# Patient Record
Sex: Male | Born: 1935 | Race: White | Hispanic: No | Marital: Married | Smoking: Current every day smoker
Health system: Southern US, Community
[De-identification: ages and names within clinical notes are randomized; demographics above are authoritative.]

## PROBLEM LIST (undated history)

## (undated) DIAGNOSIS — G8929 Other chronic pain: Secondary | ICD-10-CM

## (undated) DIAGNOSIS — I509 Heart failure, unspecified: Secondary | ICD-10-CM

## (undated) DIAGNOSIS — M1990 Unspecified osteoarthritis, unspecified site: Secondary | ICD-10-CM

## (undated) DIAGNOSIS — M436 Torticollis: Secondary | ICD-10-CM

## (undated) DIAGNOSIS — N189 Chronic kidney disease, unspecified: Secondary | ICD-10-CM

## (undated) DIAGNOSIS — C801 Malignant (primary) neoplasm, unspecified: Secondary | ICD-10-CM

## (undated) DIAGNOSIS — J449 Chronic obstructive pulmonary disease, unspecified: Secondary | ICD-10-CM

## (undated) DIAGNOSIS — I4901 Ventricular fibrillation: Secondary | ICD-10-CM

## (undated) DIAGNOSIS — R0600 Dyspnea, unspecified: Secondary | ICD-10-CM

## (undated) DIAGNOSIS — Z789 Other specified health status: Secondary | ICD-10-CM

## (undated) DIAGNOSIS — E669 Obesity, unspecified: Secondary | ICD-10-CM

## (undated) DIAGNOSIS — J189 Pneumonia, unspecified organism: Secondary | ICD-10-CM

## (undated) DIAGNOSIS — G9511 Acute infarction of spinal cord (embolic) (nonembolic): Secondary | ICD-10-CM

## (undated) DIAGNOSIS — Z950 Presence of cardiac pacemaker: Secondary | ICD-10-CM

## (undated) DIAGNOSIS — K219 Gastro-esophageal reflux disease without esophagitis: Secondary | ICD-10-CM

## (undated) DIAGNOSIS — I2510 Atherosclerotic heart disease of native coronary artery without angina pectoris: Secondary | ICD-10-CM

## (undated) DIAGNOSIS — E785 Hyperlipidemia, unspecified: Secondary | ICD-10-CM

## (undated) DIAGNOSIS — I447 Left bundle-branch block, unspecified: Secondary | ICD-10-CM

## (undated) DIAGNOSIS — N400 Enlarged prostate without lower urinary tract symptoms: Secondary | ICD-10-CM

## (undated) DIAGNOSIS — D649 Anemia, unspecified: Secondary | ICD-10-CM

## (undated) DIAGNOSIS — I4891 Unspecified atrial fibrillation: Secondary | ICD-10-CM

## (undated) DIAGNOSIS — Z95810 Presence of automatic (implantable) cardiac defibrillator: Secondary | ICD-10-CM

## (undated) DIAGNOSIS — F0390 Unspecified dementia without behavioral disturbance: Secondary | ICD-10-CM

## (undated) DIAGNOSIS — M5412 Radiculopathy, cervical region: Secondary | ICD-10-CM

## (undated) DIAGNOSIS — I10 Essential (primary) hypertension: Secondary | ICD-10-CM

## (undated) DIAGNOSIS — M542 Cervicalgia: Secondary | ICD-10-CM

## (undated) HISTORY — DX: Unspecified atrial fibrillation: I48.91

## (undated) HISTORY — PX: EYE SURGERY: SHX253

## (undated) HISTORY — DX: Anemia, unspecified: D64.9

## (undated) HISTORY — DX: Atherosclerotic heart disease of native coronary artery without angina pectoris: I25.10

## (undated) HISTORY — DX: Pneumonia, unspecified organism: J18.9

## (undated) HISTORY — DX: Dyspnea, unspecified: R06.00

## (undated) HISTORY — PX: CORONARY ANGIOPLASTY: SHX604

## (undated) HISTORY — PX: ROTATOR CUFF REPAIR: SHX139

## (undated) HISTORY — DX: Benign prostatic hyperplasia without lower urinary tract symptoms: N40.0

## (undated) HISTORY — DX: Heart failure, unspecified: I50.9

## (undated) HISTORY — DX: Left bundle-branch block, unspecified: I44.7

## (undated) HISTORY — DX: Chronic obstructive pulmonary disease, unspecified: J44.9

## (undated) HISTORY — DX: Obesity, unspecified: E66.9

## (undated) HISTORY — DX: Essential (primary) hypertension: I10

## (undated) HISTORY — PX: OTHER SURGICAL HISTORY: SHX169

## (undated) HISTORY — DX: Hyperlipidemia, unspecified: E78.5

## (undated) HISTORY — DX: Ventricular fibrillation: I49.01

---

## 1986-04-13 HISTORY — PX: KNEE ARTHROSCOPY: SHX127

## 1994-04-13 HISTORY — PX: KNEE SURGERY: SHX244

## 1997-07-12 ENCOUNTER — Encounter: Payer: Self-pay | Admitting: Family Medicine

## 1997-07-12 LAB — CONVERTED CEMR LAB: PSA: 2.6 ng/mL

## 1997-09-11 DIAGNOSIS — R918 Other nonspecific abnormal finding of lung field: Secondary | ICD-10-CM | POA: Insufficient documentation

## 1997-09-11 DIAGNOSIS — N400 Enlarged prostate without lower urinary tract symptoms: Secondary | ICD-10-CM

## 1997-09-11 HISTORY — DX: Benign prostatic hyperplasia without lower urinary tract symptoms: N40.0

## 1997-09-11 HISTORY — PX: ESOPHAGOGASTRODUODENOSCOPY: SHX1529

## 1997-09-12 ENCOUNTER — Other Ambulatory Visit: Admission: RE | Admit: 1997-09-12 | Discharge: 1997-09-12 | Payer: Self-pay | Admitting: Oncology

## 1997-10-24 ENCOUNTER — Encounter: Admission: RE | Admit: 1997-10-24 | Discharge: 1998-01-22 | Payer: Self-pay | Admitting: Oncology

## 1997-11-11 HISTORY — PX: COLONOSCOPY W/ POLYPECTOMY: SHX1380

## 1997-11-26 ENCOUNTER — Other Ambulatory Visit: Admission: RE | Admit: 1997-11-26 | Discharge: 1997-11-26 | Payer: Self-pay | Admitting: Internal Medicine

## 1998-01-21 ENCOUNTER — Inpatient Hospital Stay: Admission: RE | Admit: 1998-01-21 | Discharge: 1998-01-25 | Payer: Self-pay | Admitting: Thoracic Surgery

## 1998-01-21 HISTORY — PX: LOBECTOMY: SHX5089

## 1998-01-22 ENCOUNTER — Encounter: Payer: Self-pay | Admitting: Thoracic Surgery

## 1998-01-23 ENCOUNTER — Encounter: Payer: Self-pay | Admitting: Thoracic Surgery

## 1998-01-24 ENCOUNTER — Encounter: Payer: Self-pay | Admitting: Thoracic Surgery

## 2000-04-13 ENCOUNTER — Encounter: Payer: Self-pay | Admitting: Family Medicine

## 2000-04-13 LAB — CONVERTED CEMR LAB: PSA: 3.8 ng/mL

## 2001-11-11 ENCOUNTER — Encounter: Payer: Self-pay | Admitting: Family Medicine

## 2002-05-20 ENCOUNTER — Inpatient Hospital Stay (HOSPITAL_COMMUNITY): Admission: EM | Admit: 2002-05-20 | Discharge: 2002-05-22 | Payer: Self-pay | Admitting: Emergency Medicine

## 2002-05-22 ENCOUNTER — Encounter (INDEPENDENT_AMBULATORY_CARE_PROVIDER_SITE_OTHER): Payer: Self-pay | Admitting: Specialist

## 2002-05-22 HISTORY — PX: ESOPHAGOGASTRODUODENOSCOPY: SHX1529

## 2002-06-26 HISTORY — PX: COLONOSCOPY: SHX174

## 2002-07-13 ENCOUNTER — Encounter: Payer: Self-pay | Admitting: Family Medicine

## 2003-05-15 ENCOUNTER — Encounter: Payer: Self-pay | Admitting: Family Medicine

## 2003-05-15 LAB — FECAL OCCULT BLOOD, GUAIAC: Fecal Occult Blood: NEGATIVE

## 2004-03-24 ENCOUNTER — Ambulatory Visit: Payer: Self-pay | Admitting: Family Medicine

## 2004-05-07 ENCOUNTER — Ambulatory Visit: Payer: Self-pay | Admitting: Family Medicine

## 2004-05-27 ENCOUNTER — Ambulatory Visit: Payer: Self-pay | Admitting: Family Medicine

## 2004-05-28 ENCOUNTER — Ambulatory Visit: Payer: Self-pay | Admitting: Family Medicine

## 2004-06-12 ENCOUNTER — Ambulatory Visit: Payer: Self-pay | Admitting: Internal Medicine

## 2004-07-10 ENCOUNTER — Ambulatory Visit: Payer: Self-pay | Admitting: Internal Medicine

## 2004-07-10 HISTORY — PX: COLONOSCOPY: SHX174

## 2004-07-24 ENCOUNTER — Ambulatory Visit: Payer: Self-pay | Admitting: Family Medicine

## 2004-08-26 ENCOUNTER — Ambulatory Visit: Payer: Self-pay | Admitting: Family Medicine

## 2004-09-25 ENCOUNTER — Ambulatory Visit: Payer: Self-pay | Admitting: Family Medicine

## 2004-10-30 ENCOUNTER — Ambulatory Visit: Payer: Self-pay | Admitting: Family Medicine

## 2004-10-30 ENCOUNTER — Ambulatory Visit: Payer: Self-pay | Admitting: Internal Medicine

## 2004-12-04 ENCOUNTER — Ambulatory Visit: Payer: Self-pay | Admitting: Family Medicine

## 2005-01-16 ENCOUNTER — Ambulatory Visit: Payer: Self-pay | Admitting: Family Medicine

## 2005-02-19 ENCOUNTER — Ambulatory Visit: Payer: Self-pay | Admitting: Family Medicine

## 2005-03-27 ENCOUNTER — Ambulatory Visit: Payer: Self-pay | Admitting: Family Medicine

## 2005-04-13 HISTORY — PX: INSERT / REPLACE / REMOVE PACEMAKER: SUR710

## 2005-05-07 ENCOUNTER — Ambulatory Visit: Payer: Self-pay | Admitting: Family Medicine

## 2005-06-15 ENCOUNTER — Ambulatory Visit: Payer: Self-pay | Admitting: Family Medicine

## 2005-07-21 ENCOUNTER — Ambulatory Visit: Payer: Self-pay | Admitting: Family Medicine

## 2005-09-11 ENCOUNTER — Encounter: Payer: Self-pay | Admitting: Family Medicine

## 2005-09-16 ENCOUNTER — Ambulatory Visit: Payer: Self-pay | Admitting: Family Medicine

## 2005-09-18 ENCOUNTER — Ambulatory Visit: Payer: Self-pay | Admitting: Family Medicine

## 2005-10-19 ENCOUNTER — Ambulatory Visit: Payer: Self-pay | Admitting: Family Medicine

## 2005-11-09 ENCOUNTER — Ambulatory Visit: Payer: Self-pay | Admitting: Family Medicine

## 2005-11-11 ENCOUNTER — Encounter: Payer: Self-pay | Admitting: Family Medicine

## 2005-12-10 ENCOUNTER — Emergency Department (HOSPITAL_COMMUNITY): Admission: EM | Admit: 2005-12-10 | Discharge: 2005-12-10 | Payer: Self-pay | Admitting: Emergency Medicine

## 2005-12-11 ENCOUNTER — Ambulatory Visit: Payer: Self-pay | Admitting: Family Medicine

## 2005-12-14 ENCOUNTER — Ambulatory Visit: Payer: Self-pay | Admitting: Family Medicine

## 2005-12-15 ENCOUNTER — Ambulatory Visit: Payer: Self-pay | Admitting: Family Medicine

## 2005-12-16 ENCOUNTER — Ambulatory Visit: Payer: Self-pay | Admitting: Family Medicine

## 2005-12-18 ENCOUNTER — Ambulatory Visit: Payer: Self-pay | Admitting: Family Medicine

## 2005-12-21 ENCOUNTER — Ambulatory Visit: Payer: Self-pay | Admitting: Family Medicine

## 2005-12-23 ENCOUNTER — Ambulatory Visit: Payer: Self-pay | Admitting: Family Medicine

## 2005-12-25 ENCOUNTER — Ambulatory Visit: Payer: Self-pay | Admitting: Family Medicine

## 2005-12-28 ENCOUNTER — Ambulatory Visit: Payer: Self-pay | Admitting: Family Medicine

## 2005-12-30 ENCOUNTER — Ambulatory Visit: Payer: Self-pay | Admitting: Family Medicine

## 2006-01-01 ENCOUNTER — Ambulatory Visit: Payer: Self-pay | Admitting: Family Medicine

## 2006-01-11 ENCOUNTER — Ambulatory Visit: Payer: Self-pay | Admitting: Internal Medicine

## 2006-01-11 ENCOUNTER — Ambulatory Visit: Payer: Self-pay | Admitting: *Deleted

## 2006-01-11 HISTORY — PX: CARDIAC CATHETERIZATION: SHX172

## 2006-01-15 ENCOUNTER — Ambulatory Visit: Payer: Self-pay | Admitting: Family Medicine

## 2006-01-21 ENCOUNTER — Ambulatory Visit: Payer: Self-pay | Admitting: Internal Medicine

## 2006-01-21 ENCOUNTER — Inpatient Hospital Stay (HOSPITAL_COMMUNITY): Admission: EM | Admit: 2006-01-21 | Discharge: 2006-01-28 | Payer: Self-pay | Admitting: Emergency Medicine

## 2006-01-22 ENCOUNTER — Encounter: Payer: Self-pay | Admitting: Cardiology

## 2006-01-26 ENCOUNTER — Ambulatory Visit: Payer: Self-pay | Admitting: Internal Medicine

## 2006-02-04 ENCOUNTER — Ambulatory Visit: Payer: Self-pay | Admitting: Family Medicine

## 2006-02-08 ENCOUNTER — Ambulatory Visit: Payer: Self-pay | Admitting: Family Medicine

## 2006-02-15 ENCOUNTER — Ambulatory Visit: Payer: Self-pay | Admitting: Family Medicine

## 2006-02-16 ENCOUNTER — Ambulatory Visit: Payer: Self-pay | Admitting: Internal Medicine

## 2006-02-18 ENCOUNTER — Inpatient Hospital Stay (HOSPITAL_COMMUNITY): Admission: EM | Admit: 2006-02-18 | Discharge: 2006-02-22 | Payer: Self-pay | Admitting: Emergency Medicine

## 2006-02-19 ENCOUNTER — Encounter (INDEPENDENT_AMBULATORY_CARE_PROVIDER_SITE_OTHER): Payer: Self-pay | Admitting: Specialist

## 2006-02-19 HISTORY — PX: ESOPHAGOGASTRODUODENOSCOPY: SHX1529

## 2006-02-26 ENCOUNTER — Ambulatory Visit (HOSPITAL_COMMUNITY): Admission: RE | Admit: 2006-02-26 | Discharge: 2006-02-26 | Payer: Self-pay | Admitting: Internal Medicine

## 2006-03-02 ENCOUNTER — Encounter: Payer: Self-pay | Admitting: Internal Medicine

## 2006-03-02 ENCOUNTER — Ambulatory Visit: Payer: Self-pay

## 2006-03-08 ENCOUNTER — Ambulatory Visit: Payer: Self-pay | Admitting: Internal Medicine

## 2006-03-08 ENCOUNTER — Ambulatory Visit: Payer: Self-pay | Admitting: Family Medicine

## 2006-03-10 ENCOUNTER — Ambulatory Visit: Payer: Self-pay | Admitting: Internal Medicine

## 2006-03-11 ENCOUNTER — Ambulatory Visit: Payer: Self-pay | Admitting: Internal Medicine

## 2006-03-22 ENCOUNTER — Ambulatory Visit: Payer: Self-pay | Admitting: Family Medicine

## 2006-03-24 ENCOUNTER — Ambulatory Visit: Payer: Self-pay | Admitting: Family Medicine

## 2006-03-26 ENCOUNTER — Ambulatory Visit: Payer: Self-pay | Admitting: Family Medicine

## 2006-04-16 ENCOUNTER — Ambulatory Visit: Payer: Self-pay | Admitting: Family Medicine

## 2006-04-23 ENCOUNTER — Ambulatory Visit: Payer: Self-pay | Admitting: Family Medicine

## 2006-04-27 ENCOUNTER — Ambulatory Visit: Payer: Self-pay | Admitting: Internal Medicine

## 2006-05-06 ENCOUNTER — Ambulatory Visit: Payer: Self-pay | Admitting: Family Medicine

## 2006-06-21 ENCOUNTER — Encounter: Payer: Self-pay | Admitting: Internal Medicine

## 2006-06-21 ENCOUNTER — Ambulatory Visit: Payer: Self-pay

## 2006-06-29 ENCOUNTER — Ambulatory Visit: Payer: Self-pay | Admitting: Internal Medicine

## 2006-06-30 ENCOUNTER — Encounter: Payer: Self-pay | Admitting: Family Medicine

## 2006-06-30 DIAGNOSIS — Z8709 Personal history of other diseases of the respiratory system: Secondary | ICD-10-CM | POA: Insufficient documentation

## 2006-06-30 DIAGNOSIS — N400 Enlarged prostate without lower urinary tract symptoms: Secondary | ICD-10-CM | POA: Insufficient documentation

## 2006-06-30 DIAGNOSIS — J449 Chronic obstructive pulmonary disease, unspecified: Secondary | ICD-10-CM

## 2006-06-30 DIAGNOSIS — D509 Iron deficiency anemia, unspecified: Secondary | ICD-10-CM

## 2006-06-30 DIAGNOSIS — E785 Hyperlipidemia, unspecified: Secondary | ICD-10-CM | POA: Insufficient documentation

## 2006-06-30 DIAGNOSIS — K645 Perianal venous thrombosis: Secondary | ICD-10-CM | POA: Insufficient documentation

## 2006-06-30 DIAGNOSIS — Z872 Personal history of diseases of the skin and subcutaneous tissue: Secondary | ICD-10-CM | POA: Insufficient documentation

## 2006-07-06 ENCOUNTER — Ambulatory Visit: Payer: Self-pay | Admitting: Internal Medicine

## 2006-07-12 ENCOUNTER — Ambulatory Visit: Payer: Self-pay | Admitting: Internal Medicine

## 2006-07-12 ENCOUNTER — Ambulatory Visit (HOSPITAL_COMMUNITY): Admission: RE | Admit: 2006-07-12 | Discharge: 2006-07-12 | Payer: Self-pay | Admitting: Internal Medicine

## 2006-07-27 ENCOUNTER — Ambulatory Visit: Payer: Self-pay | Admitting: Family Medicine

## 2006-07-30 ENCOUNTER — Ambulatory Visit: Payer: Self-pay | Admitting: Internal Medicine

## 2006-08-04 ENCOUNTER — Ambulatory Visit: Payer: Self-pay | Admitting: Internal Medicine

## 2006-08-17 ENCOUNTER — Ambulatory Visit: Payer: Self-pay | Admitting: Family Medicine

## 2006-08-17 LAB — CONVERTED CEMR LAB
CO2: 29 meq/L (ref 19–32)
Chloride: 104 meq/L (ref 96–112)
Creatinine, Ser: 1.2 mg/dL (ref 0.4–1.5)
HCT: 30.9 % — ABNORMAL LOW (ref 39.0–52.0)
Hemoglobin: 10.4 g/dL — ABNORMAL LOW (ref 13.0–17.0)
MCV: 83.8 fL (ref 78.0–100.0)
Prothrombin Time: 11.8 s (ref 10.0–14.0)
RDW: 15.5 % — ABNORMAL HIGH (ref 11.5–14.6)
Sodium: 140 meq/L (ref 135–145)
WBC: 7.7 10*3/uL (ref 4.5–10.5)
aPTT: 33.8 s (ref 26.5–36.5)

## 2006-08-18 ENCOUNTER — Ambulatory Visit: Payer: Self-pay | Admitting: Family Medicine

## 2006-08-19 ENCOUNTER — Telehealth (INDEPENDENT_AMBULATORY_CARE_PROVIDER_SITE_OTHER): Payer: Self-pay | Admitting: *Deleted

## 2006-08-19 LAB — CONVERTED CEMR LAB
Iron: 49 ug/dL (ref 42–165)
Transferrin: 277.7 mg/dL (ref >=212.0)

## 2006-08-24 ENCOUNTER — Inpatient Hospital Stay (HOSPITAL_COMMUNITY): Admission: RE | Admit: 2006-08-24 | Discharge: 2006-08-26 | Payer: Self-pay | Admitting: Internal Medicine

## 2006-08-24 ENCOUNTER — Ambulatory Visit: Payer: Self-pay | Admitting: Internal Medicine

## 2006-08-27 ENCOUNTER — Ambulatory Visit: Payer: Self-pay | Admitting: Family Medicine

## 2006-08-31 ENCOUNTER — Encounter: Payer: Self-pay | Admitting: Family Medicine

## 2006-09-07 ENCOUNTER — Ambulatory Visit: Payer: Self-pay | Admitting: Internal Medicine

## 2006-09-08 ENCOUNTER — Ambulatory Visit: Payer: Self-pay

## 2006-09-15 ENCOUNTER — Ambulatory Visit: Payer: Self-pay | Admitting: Internal Medicine

## 2006-09-17 ENCOUNTER — Ambulatory Visit: Payer: Self-pay | Admitting: Family Medicine

## 2006-09-18 LAB — CONVERTED CEMR LAB
Chloride: 104 meq/L (ref 96–112)
GFR calc non Af Amer: 49 mL/min
Potassium: 4 meq/L (ref 3.5–5.1)
Sodium: 140 meq/L (ref 135–145)

## 2006-09-21 ENCOUNTER — Ambulatory Visit: Payer: Self-pay | Admitting: Family Medicine

## 2006-09-28 ENCOUNTER — Ambulatory Visit: Payer: Self-pay | Admitting: Family Medicine

## 2006-09-28 LAB — CONVERTED CEMR LAB
BUN: 21 mg/dL (ref 6–23)
Creatinine, Ser: 1.4 mg/dL (ref 0.4–1.5)
GFR calc Af Amer: 64 mL/min
Potassium: 4.1 meq/L (ref 3.5–5.1)

## 2006-09-30 ENCOUNTER — Telehealth: Payer: Self-pay | Admitting: Family Medicine

## 2006-10-03 ENCOUNTER — Emergency Department (HOSPITAL_COMMUNITY): Admission: EM | Admit: 2006-10-03 | Discharge: 2006-10-03 | Payer: Self-pay | Admitting: Emergency Medicine

## 2006-10-03 HISTORY — PX: CT HEAD LIMITED W/O CM: HXRAD127

## 2006-10-04 ENCOUNTER — Ambulatory Visit: Payer: Self-pay | Admitting: Internal Medicine

## 2006-10-21 ENCOUNTER — Ambulatory Visit: Payer: Self-pay | Admitting: Family Medicine

## 2006-10-21 DIAGNOSIS — G909 Disorder of the autonomic nervous system, unspecified: Secondary | ICD-10-CM | POA: Insufficient documentation

## 2006-10-22 ENCOUNTER — Ambulatory Visit: Payer: Self-pay

## 2006-11-01 ENCOUNTER — Ambulatory Visit: Payer: Self-pay | Admitting: Internal Medicine

## 2006-11-02 ENCOUNTER — Ambulatory Visit: Payer: Self-pay | Admitting: Family Medicine

## 2006-11-02 LAB — CONVERTED CEMR LAB
BUN: 28 mg/dL — ABNORMAL HIGH (ref 6–23)
Basophils Relative: 0.6 % (ref 0.0–1.0)
CO2: 28 meq/L (ref 19–32)
Calcium: 9.1 mg/dL (ref 8.4–10.5)
Creatinine, Ser: 1.4 mg/dL (ref 0.4–1.5)
INR: 0.9 (ref 0.9–2.0)
Monocytes Relative: 10.3 % (ref 3.0–11.0)
Platelets: 257 10*3/uL (ref 150–400)
Pro B Natriuretic peptide (BNP): 77 pg/mL (ref 0.0–100.0)
Prothrombin Time: 11.4 s (ref 10.0–14.0)
RBC: 3.73 M/uL — ABNORMAL LOW (ref 4.22–5.81)
RDW: 15.5 % — ABNORMAL HIGH (ref 11.5–14.6)

## 2006-11-05 ENCOUNTER — Encounter: Payer: Self-pay | Admitting: Family Medicine

## 2006-11-05 ENCOUNTER — Inpatient Hospital Stay (HOSPITAL_BASED_OUTPATIENT_CLINIC_OR_DEPARTMENT_OTHER): Admission: RE | Admit: 2006-11-05 | Discharge: 2006-11-05 | Payer: Self-pay | Admitting: Internal Medicine

## 2006-11-05 ENCOUNTER — Ambulatory Visit: Payer: Self-pay | Admitting: Internal Medicine

## 2006-11-05 HISTORY — PX: CARDIAC CATHETERIZATION: SHX172

## 2006-11-17 ENCOUNTER — Ambulatory Visit: Payer: Self-pay | Admitting: Internal Medicine

## 2006-11-18 ENCOUNTER — Ambulatory Visit: Admission: RE | Admit: 2006-11-18 | Discharge: 2006-11-18 | Payer: Self-pay | Admitting: Internal Medicine

## 2006-11-18 ENCOUNTER — Ambulatory Visit: Payer: Self-pay | Admitting: Internal Medicine

## 2006-11-18 ENCOUNTER — Ambulatory Visit: Payer: Self-pay

## 2006-11-18 LAB — CONVERTED CEMR LAB
BUN: 41 mg/dL — ABNORMAL HIGH (ref 6–23)
Chloride: 107 meq/L (ref 96–112)
Creatinine, Ser: 1.6 mg/dL — ABNORMAL HIGH (ref 0.4–1.5)
GFR calc non Af Amer: 45 mL/min
Potassium: 4.6 meq/L (ref 3.5–5.1)
Pro B Natriuretic peptide (BNP): 42 pg/mL (ref 0.0–100.0)

## 2006-11-24 ENCOUNTER — Ambulatory Visit: Payer: Self-pay | Admitting: Internal Medicine

## 2006-11-25 ENCOUNTER — Inpatient Hospital Stay (HOSPITAL_COMMUNITY): Admission: AD | Admit: 2006-11-25 | Discharge: 2006-12-04 | Payer: Self-pay | Admitting: Internal Medicine

## 2006-11-25 ENCOUNTER — Ambulatory Visit: Payer: Self-pay | Admitting: Internal Medicine

## 2006-12-04 ENCOUNTER — Encounter: Payer: Self-pay | Admitting: Family Medicine

## 2006-12-06 ENCOUNTER — Encounter: Payer: Self-pay | Admitting: Family Medicine

## 2006-12-07 ENCOUNTER — Ambulatory Visit: Payer: Self-pay | Admitting: Internal Medicine

## 2006-12-07 ENCOUNTER — Encounter: Payer: Self-pay | Admitting: Family Medicine

## 2006-12-16 ENCOUNTER — Ambulatory Visit: Payer: Self-pay

## 2006-12-21 ENCOUNTER — Ambulatory Visit: Payer: Self-pay | Admitting: Internal Medicine

## 2007-01-06 ENCOUNTER — Ambulatory Visit: Payer: Self-pay | Admitting: Internal Medicine

## 2007-01-13 ENCOUNTER — Ambulatory Visit: Payer: Self-pay | Admitting: Family Medicine

## 2007-01-17 ENCOUNTER — Ambulatory Visit: Payer: Self-pay | Admitting: Family Medicine

## 2007-01-17 LAB — CONVERTED CEMR LAB
Blood in Urine, dipstick: NEGATIVE
Glucose, Urine, Semiquant: NEGATIVE
pH: 7

## 2007-02-28 ENCOUNTER — Ambulatory Visit: Payer: Self-pay | Admitting: Internal Medicine

## 2007-03-11 ENCOUNTER — Ambulatory Visit: Payer: Self-pay | Admitting: Family Medicine

## 2007-03-14 LAB — CONVERTED CEMR LAB
ALT: 16 units/L (ref 0–53)
AST: 19 units/L (ref 0–37)
Alkaline Phosphatase: 80 units/L (ref 39–117)
Basophils Relative: 0.3 % (ref 0.0–1.0)
Bilirubin, Direct: 0.1 mg/dL (ref 0.0–0.3)
Eosinophils Relative: 2.8 % (ref 0.0–5.0)
HCT: 33.8 % — ABNORMAL LOW (ref 39.0–52.0)
LDL Cholesterol: 70 mg/dL (ref 0–99)
MCV: 88.2 fL (ref 78.0–100.0)
Neutrophils Relative %: 63.4 % (ref 43.0–77.0)
Platelets: 272 10*3/uL (ref 150–400)
RBC: 3.83 M/uL — ABNORMAL LOW (ref 4.22–5.81)
RDW: 14.1 % (ref 11.5–14.6)
Total Bilirubin: 0.5 mg/dL (ref 0.3–1.2)
Total CHOL/HDL Ratio: 4.9
Total Protein: 6.7 g/dL (ref 6.0–8.3)
VLDL: 23 mg/dL (ref 0–40)
WBC: 9.5 10*3/uL (ref 4.5–10.5)

## 2007-03-30 ENCOUNTER — Ambulatory Visit: Payer: Self-pay | Admitting: Internal Medicine

## 2007-05-05 ENCOUNTER — Ambulatory Visit: Payer: Self-pay | Admitting: Family Medicine

## 2007-05-25 ENCOUNTER — Ambulatory Visit: Payer: Self-pay | Admitting: Internal Medicine

## 2007-05-25 LAB — CONVERTED CEMR LAB
Alkaline Phosphatase: 100 units/L (ref 39–117)
BUN: 28 mg/dL — ABNORMAL HIGH (ref 6–23)
CO2: 21 meq/L (ref 19–32)
Digitoxin Lvl: 1.1 ng/mL (ref 0.8–2.0)
Glucose, Bld: 153 mg/dL — ABNORMAL HIGH (ref 70–99)
Total Bilirubin: 0.6 mg/dL (ref 0.3–1.2)

## 2007-05-31 ENCOUNTER — Encounter: Payer: Self-pay | Admitting: Internal Medicine

## 2007-05-31 ENCOUNTER — Ambulatory Visit: Payer: Self-pay

## 2007-07-19 ENCOUNTER — Ambulatory Visit: Payer: Self-pay | Admitting: Internal Medicine

## 2007-09-22 ENCOUNTER — Ambulatory Visit: Payer: Self-pay | Admitting: Family Medicine

## 2007-09-23 LAB — CONVERTED CEMR LAB
BUN: 16 mg/dL (ref 6–23)
Basophils Relative: 0.1 % (ref 0.0–1.0)
Chloride: 99 meq/L (ref 96–112)
Creatinine, Ser: 1.1 mg/dL (ref 0.4–1.5)
Eosinophils Absolute: 0.1 10*3/uL (ref 0.0–0.7)
Eosinophils Relative: 1.7 % (ref 0.0–5.0)
GFR calc non Af Amer: 70 mL/min
Glucose, Bld: 224 mg/dL — ABNORMAL HIGH (ref 70–99)
HCT: 31.9 % — ABNORMAL LOW (ref 39.0–52.0)
MCV: 87.9 fL (ref 78.0–100.0)
Monocytes Absolute: 0.8 10*3/uL (ref 0.1–1.0)
Monocytes Relative: 9.5 % (ref 3.0–12.0)
Neutrophils Relative %: 70.9 % (ref 43.0–77.0)
Platelets: 249 10*3/uL (ref 150–400)
Potassium: 3.3 meq/L — ABNORMAL LOW (ref 3.5–5.1)
RBC: 3.62 M/uL — ABNORMAL LOW (ref 4.22–5.81)
WBC: 8.5 10*3/uL (ref 4.5–10.5)

## 2007-11-10 ENCOUNTER — Telehealth: Payer: Self-pay | Admitting: Family Medicine

## 2008-01-12 ENCOUNTER — Ambulatory Visit: Payer: Self-pay | Admitting: Family Medicine

## 2008-02-12 DIAGNOSIS — I4901 Ventricular fibrillation: Secondary | ICD-10-CM

## 2008-02-12 HISTORY — DX: Ventricular fibrillation: I49.01

## 2008-03-01 ENCOUNTER — Ambulatory Visit: Payer: Self-pay | Admitting: Family Medicine

## 2008-03-01 LAB — CONVERTED CEMR LAB
BUN: 14 mg/dL (ref 6–23)
Basophils Absolute: 0.1 10*3/uL (ref 0.0–0.1)
Basophils Relative: 0.7 % (ref 0.0–3.0)
Calcium: 9.3 mg/dL (ref 8.4–10.5)
Chloride: 107 meq/L (ref 96–112)
Creatinine, Ser: 1 mg/dL (ref 0.4–1.5)
Eosinophils Absolute: 0.1 10*3/uL (ref 0.0–0.7)
Eosinophils Relative: 1.3 % (ref 0.0–5.0)
GFR calc Af Amer: 94 mL/min
GFR calc non Af Amer: 77 mL/min
HCT: 35.4 % — ABNORMAL LOW (ref 39.0–52.0)
Hemoglobin: 12.1 g/dL — ABNORMAL LOW (ref 13.0–17.0)
Hgb A1c MFr Bld: 6.3 % — ABNORMAL HIGH (ref 4.6–6.0)
MCHC: 34.2 g/dL (ref 30.0–36.0)
MCV: 87.3 fL (ref 78.0–100.0)
Monocytes Absolute: 1 10*3/uL (ref 0.1–1.0)
Neutro Abs: 7.6 10*3/uL (ref 1.4–7.7)
RBC: 4.05 M/uL — ABNORMAL LOW (ref 4.22–5.81)
WBC: 11 10*3/uL — ABNORMAL HIGH (ref 4.5–10.5)

## 2008-04-26 ENCOUNTER — Ambulatory Visit: Payer: Self-pay | Admitting: Internal Medicine

## 2008-04-26 LAB — CONVERTED CEMR LAB
ALT: 12 units/L (ref 0–53)
AST: 13 units/L (ref 0–37)
Albumin: 4.1 g/dL (ref 3.5–5.2)
Alkaline Phosphatase: 100 units/L (ref 39–117)
BUN: 15 mg/dL (ref 6–23)
Hemoglobin: 11.5 g/dL — ABNORMAL LOW (ref 13.0–17.0)
MCHC: 31 g/dL (ref 30.0–36.0)
Platelets: 301 10*3/uL (ref 150–400)
Potassium: 3.6 meq/L (ref 3.5–5.3)
RDW: 14.7 % (ref 11.5–15.5)

## 2008-05-07 ENCOUNTER — Encounter: Payer: Self-pay | Admitting: Internal Medicine

## 2008-05-07 ENCOUNTER — Ambulatory Visit: Payer: Self-pay

## 2008-05-25 ENCOUNTER — Encounter: Payer: Self-pay | Admitting: Internal Medicine

## 2008-05-30 ENCOUNTER — Ambulatory Visit: Payer: Self-pay | Admitting: Family Medicine

## 2008-06-07 ENCOUNTER — Encounter: Payer: Self-pay | Admitting: Family Medicine

## 2008-06-08 ENCOUNTER — Ambulatory Visit: Payer: Self-pay | Admitting: Internal Medicine

## 2008-06-13 ENCOUNTER — Encounter: Admission: RE | Admit: 2008-06-13 | Discharge: 2008-06-13 | Payer: Self-pay | Admitting: Neurological Surgery

## 2008-06-19 ENCOUNTER — Ambulatory Visit: Payer: Self-pay | Admitting: Internal Medicine

## 2008-06-19 ENCOUNTER — Encounter: Payer: Self-pay | Admitting: Internal Medicine

## 2008-06-19 HISTORY — PX: COLONOSCOPY: SHX174

## 2008-06-19 LAB — HM COLONOSCOPY

## 2008-06-20 ENCOUNTER — Encounter: Payer: Self-pay | Admitting: Internal Medicine

## 2008-06-27 ENCOUNTER — Encounter: Payer: Self-pay | Admitting: Family Medicine

## 2008-07-02 ENCOUNTER — Encounter (INDEPENDENT_AMBULATORY_CARE_PROVIDER_SITE_OTHER): Payer: Self-pay | Admitting: *Deleted

## 2008-07-02 ENCOUNTER — Encounter: Payer: Self-pay | Admitting: Internal Medicine

## 2008-07-02 ENCOUNTER — Ambulatory Visit: Payer: Self-pay | Admitting: Internal Medicine

## 2008-07-03 ENCOUNTER — Encounter: Payer: Self-pay | Admitting: Internal Medicine

## 2008-07-03 ENCOUNTER — Telehealth (INDEPENDENT_AMBULATORY_CARE_PROVIDER_SITE_OTHER): Payer: Self-pay | Admitting: *Deleted

## 2008-07-03 ENCOUNTER — Ambulatory Visit: Payer: Self-pay | Admitting: Internal Medicine

## 2008-07-03 ENCOUNTER — Encounter (INDEPENDENT_AMBULATORY_CARE_PROVIDER_SITE_OTHER): Payer: Self-pay | Admitting: *Deleted

## 2008-07-03 DIAGNOSIS — I4901 Ventricular fibrillation: Secondary | ICD-10-CM

## 2008-07-04 ENCOUNTER — Ambulatory Visit: Payer: Self-pay

## 2008-07-04 ENCOUNTER — Encounter: Payer: Self-pay | Admitting: Cardiology

## 2008-07-05 ENCOUNTER — Encounter: Payer: Self-pay | Admitting: Family Medicine

## 2008-07-05 LAB — CONVERTED CEMR LAB
Albumin: 4.3 g/dL (ref 3.5–5.2)
BUN: 16 mg/dL (ref 6–23)
CO2: 25 meq/L (ref 19–32)
Glucose, Bld: 101 mg/dL — ABNORMAL HIGH (ref 70–99)
Magnesium: 2.1 mg/dL (ref 1.5–2.5)
Potassium: 3.2 meq/L — ABNORMAL LOW (ref 3.5–5.3)
Sodium: 145 meq/L (ref 135–145)
Total Bilirubin: 0.5 mg/dL (ref 0.3–1.2)
Total Protein: 6.6 g/dL (ref 6.0–8.3)

## 2008-07-09 ENCOUNTER — Ambulatory Visit: Payer: Self-pay | Admitting: Cardiovascular Disease

## 2008-07-09 LAB — CONVERTED CEMR LAB
BUN: 23 mg/dL (ref 6–23)
CO2: 26 meq/L (ref 19–32)
Calcium: 8.8 mg/dL (ref 8.4–10.5)
Glucose, Bld: 150 mg/dL — ABNORMAL HIGH (ref 70–99)
Sodium: 141 meq/L (ref 135–145)

## 2008-07-10 ENCOUNTER — Ambulatory Visit: Payer: Self-pay | Admitting: Internal Medicine

## 2008-07-12 ENCOUNTER — Ambulatory Visit: Payer: Self-pay | Admitting: Family Medicine

## 2008-07-12 LAB — CONVERTED CEMR LAB
ALT: 27 units/L (ref 0–53)
Alkaline Phosphatase: 95 units/L (ref 39–117)
Basophils Relative: 0.6 % (ref 0.0–3.0)
Bilirubin, Direct: 0 mg/dL (ref 0.0–0.3)
Eosinophils Relative: 1.8 % (ref 0.0–5.0)
HCT: 38.6 % — ABNORMAL LOW (ref 39.0–52.0)
Iron: 55 ug/dL (ref 42–165)
Lymphs Abs: 2.3 10*3/uL (ref 0.7–4.0)
MCHC: 33.4 g/dL (ref 30.0–36.0)
MCV: 87 fL (ref 78.0–100.0)
Microalb Creat Ratio: 35.5 mg/g — ABNORMAL HIGH (ref 0.0–30.0)
Monocytes Absolute: 0.8 10*3/uL (ref 0.1–1.0)
RBC: 4.44 M/uL (ref 4.22–5.81)
Saturation Ratios: 13.8 % — ABNORMAL LOW (ref 20.0–50.0)
Total Bilirubin: 0.6 mg/dL (ref 0.3–1.2)
Transferrin: 285.1 mg/dL (ref 212.0–360.0)
VLDL: 17.4 mg/dL (ref 0.0–40.0)
WBC: 8.4 10*3/uL (ref 4.5–10.5)

## 2008-07-17 ENCOUNTER — Ambulatory Visit: Payer: Self-pay | Admitting: Family Medicine

## 2008-09-17 ENCOUNTER — Encounter: Payer: Self-pay | Admitting: Internal Medicine

## 2008-09-17 ENCOUNTER — Ambulatory Visit: Payer: Self-pay | Admitting: Internal Medicine

## 2008-09-18 LAB — CONVERTED CEMR LAB
Calcium: 9.1 mg/dL (ref 8.4–10.5)
Sodium: 141 meq/L (ref 135–145)

## 2008-10-10 DIAGNOSIS — C61 Malignant neoplasm of prostate: Secondary | ICD-10-CM

## 2008-10-14 ENCOUNTER — Encounter: Payer: Self-pay | Admitting: Family Medicine

## 2008-10-17 ENCOUNTER — Encounter: Payer: Self-pay | Admitting: Family Medicine

## 2008-11-06 ENCOUNTER — Ambulatory Visit: Admission: RE | Admit: 2008-11-06 | Discharge: 2008-12-21 | Payer: Self-pay | Admitting: Radiation Oncology

## 2008-11-07 ENCOUNTER — Ambulatory Visit: Payer: Self-pay | Admitting: Internal Medicine

## 2008-11-07 ENCOUNTER — Encounter: Payer: Self-pay | Admitting: Family Medicine

## 2008-11-07 DIAGNOSIS — F17200 Nicotine dependence, unspecified, uncomplicated: Secondary | ICD-10-CM

## 2008-12-31 ENCOUNTER — Ambulatory Visit: Payer: Self-pay | Admitting: Internal Medicine

## 2009-01-21 ENCOUNTER — Telehealth: Payer: Self-pay | Admitting: Family Medicine

## 2009-01-28 ENCOUNTER — Ambulatory Visit: Payer: Self-pay | Admitting: Family Medicine

## 2009-01-28 LAB — CONVERTED CEMR LAB: Hgb A1c MFr Bld: 6 % (ref 4.6–6.5)

## 2009-01-31 ENCOUNTER — Telehealth: Payer: Self-pay | Admitting: Family Medicine

## 2009-01-31 ENCOUNTER — Ambulatory Visit: Payer: Self-pay | Admitting: Family Medicine

## 2009-02-04 ENCOUNTER — Telehealth: Payer: Self-pay | Admitting: Family Medicine

## 2009-02-04 ENCOUNTER — Ambulatory Visit: Payer: Self-pay | Admitting: Family Medicine

## 2009-04-13 DIAGNOSIS — G9511 Acute infarction of spinal cord (embolic) (nonembolic): Secondary | ICD-10-CM

## 2009-04-13 HISTORY — DX: Acute infarction of spinal cord (embolic) (nonembolic): G95.11

## 2009-04-15 ENCOUNTER — Ambulatory Visit: Payer: Self-pay | Admitting: Internal Medicine

## 2009-04-19 ENCOUNTER — Ambulatory Visit (HOSPITAL_COMMUNITY): Admission: RE | Admit: 2009-04-19 | Discharge: 2009-04-19 | Payer: Self-pay | Admitting: Internal Medicine

## 2009-04-19 ENCOUNTER — Ambulatory Visit: Payer: Self-pay | Admitting: Internal Medicine

## 2009-04-22 LAB — CONVERTED CEMR LAB
CO2: 25 meq/L (ref 19–32)
Calcium: 8.9 mg/dL (ref 8.4–10.5)
Chloride: 105 meq/L (ref 96–112)
Hemoglobin: 11.9 g/dL — ABNORMAL LOW (ref 13.0–17.0)
INR: 0.96 (ref <1.50)
RBC: 4 M/uL — ABNORMAL LOW (ref 4.22–5.81)
RDW: 15.2 % (ref 11.5–15.5)
Sodium: 142 meq/L (ref 135–145)
WBC: 9.6 10*3/uL (ref 4.0–10.5)

## 2009-04-23 ENCOUNTER — Encounter: Payer: Self-pay | Admitting: Internal Medicine

## 2009-04-29 ENCOUNTER — Encounter: Payer: Self-pay | Admitting: Internal Medicine

## 2009-04-29 ENCOUNTER — Ambulatory Visit: Payer: Self-pay

## 2009-05-14 ENCOUNTER — Ambulatory Visit: Payer: Self-pay | Admitting: Cardiovascular Disease

## 2009-05-14 ENCOUNTER — Encounter: Payer: Self-pay | Admitting: Internal Medicine

## 2009-05-30 ENCOUNTER — Ambulatory Visit: Payer: Self-pay | Admitting: Internal Medicine

## 2009-06-03 ENCOUNTER — Ambulatory Visit: Payer: Self-pay | Admitting: Internal Medicine

## 2009-06-06 ENCOUNTER — Ambulatory Visit: Payer: Self-pay | Admitting: Cardiovascular Disease

## 2009-06-10 ENCOUNTER — Telehealth: Payer: Self-pay | Admitting: Internal Medicine

## 2009-06-10 LAB — CONVERTED CEMR LAB
CO2: 24 meq/L (ref 19–32)
Chloride: 105 meq/L (ref 96–112)
Creatinine, Ser: 1.07 mg/dL (ref 0.40–1.50)

## 2009-07-16 ENCOUNTER — Encounter: Payer: Self-pay | Admitting: Internal Medicine

## 2009-07-26 ENCOUNTER — Ambulatory Visit: Payer: Self-pay | Admitting: Family Medicine

## 2009-07-28 LAB — CONVERTED CEMR LAB
ALT: 47 units/L (ref 0–53)
AST: 34 units/L (ref 0–37)
Albumin: 4 g/dL (ref 3.5–5.2)
BUN: 18 mg/dL (ref 6–23)
Basophils Relative: 0.7 % (ref 0.0–3.0)
Cholesterol: 81 mg/dL (ref 0–200)
Eosinophils Absolute: 0.2 10*3/uL (ref 0.0–0.7)
GFR calc non Af Amer: 69.04 mL/min (ref >60)
Glucose, Bld: 98 mg/dL (ref 70–99)
HCT: 35.5 % — ABNORMAL LOW (ref 39.0–52.0)
Hemoglobin: 12.2 g/dL — ABNORMAL LOW (ref 13.0–17.0)
Iron: 46 ug/dL (ref 42–165)
MCHC: 34.2 g/dL (ref 30.0–36.0)
MCV: 91.7 fL (ref 78.0–100.0)
Monocytes Absolute: 0.8 10*3/uL (ref 0.1–1.0)
Neutro Abs: 4.8 10*3/uL (ref 1.4–7.7)
Potassium: 4.3 meq/L (ref 3.5–5.1)
RBC: 3.87 M/uL — ABNORMAL LOW (ref 4.22–5.81)
TSH: 1.58 microintl units/mL (ref 0.35–5.50)
Total Protein: 6.4 g/dL (ref 6.0–8.3)
VLDL: 18.4 mg/dL (ref 0.0–40.0)

## 2009-07-31 ENCOUNTER — Ambulatory Visit: Payer: Self-pay | Admitting: Family Medicine

## 2009-07-31 ENCOUNTER — Encounter: Admission: RE | Admit: 2009-07-31 | Discharge: 2009-07-31 | Payer: Self-pay | Admitting: Family Medicine

## 2009-08-12 ENCOUNTER — Encounter: Payer: Self-pay | Admitting: Internal Medicine

## 2009-08-21 ENCOUNTER — Encounter: Payer: Self-pay | Admitting: Internal Medicine

## 2009-10-07 ENCOUNTER — Telehealth: Payer: Self-pay | Admitting: Family Medicine

## 2009-11-14 ENCOUNTER — Ambulatory Visit: Payer: Self-pay | Admitting: Internal Medicine

## 2009-11-19 ENCOUNTER — Encounter (INDEPENDENT_AMBULATORY_CARE_PROVIDER_SITE_OTHER): Payer: Self-pay | Admitting: *Deleted

## 2009-11-29 ENCOUNTER — Encounter: Payer: Self-pay | Admitting: Internal Medicine

## 2009-12-27 ENCOUNTER — Ambulatory Visit: Payer: Self-pay | Admitting: Internal Medicine

## 2009-12-30 LAB — CONVERTED CEMR LAB
AST: 24 units/L (ref 0–37)
Albumin: 4.3 g/dL (ref 3.5–5.2)
BUN: 20 mg/dL (ref 6–23)
Basophils Relative: 1 % (ref 0–1)
Calcium: 9.1 mg/dL (ref 8.4–10.5)
Chloride: 105 meq/L (ref 96–112)
Lymphs Abs: 3.2 10*3/uL (ref 0.7–4.0)
MCHC: 33.2 g/dL (ref 30.0–36.0)
Monocytes Relative: 8 % (ref 3–12)
Neutro Abs: 4.5 10*3/uL (ref 1.7–7.7)
Neutrophils Relative %: 52 % (ref 43–77)
Platelets: 184 10*3/uL (ref 150–400)
Potassium: 4.2 meq/L (ref 3.5–5.3)
RBC: 4.12 M/uL — ABNORMAL LOW (ref 4.22–5.81)
Sodium: 141 meq/L (ref 135–145)
Total Protein: 6.2 g/dL (ref 6.0–8.3)
WBC: 8.7 10*3/uL (ref 4.0–10.5)

## 2010-01-02 ENCOUNTER — Telehealth: Payer: Self-pay | Admitting: Internal Medicine

## 2010-01-22 ENCOUNTER — Telehealth: Payer: Self-pay | Admitting: Family Medicine

## 2010-01-22 DIAGNOSIS — E559 Vitamin D deficiency, unspecified: Secondary | ICD-10-CM | POA: Insufficient documentation

## 2010-01-23 ENCOUNTER — Ambulatory Visit: Payer: Self-pay | Admitting: Family Medicine

## 2010-01-24 LAB — CONVERTED CEMR LAB
Albumin: 3.8 g/dL (ref 3.5–5.2)
Alkaline Phosphatase: 116 units/L (ref 39–117)
BUN: 18 mg/dL (ref 6–23)
Basophils Absolute: 0.1 10*3/uL (ref 0.0–0.1)
CO2: 28 meq/L (ref 19–32)
Calcium: 9 mg/dL (ref 8.4–10.5)
Cholesterol: 99 mg/dL (ref 0–200)
Creatinine, Ser: 0.9 mg/dL (ref 0.4–1.5)
GFR calc non Af Amer: 82.67 mL/min (ref >60)
Glucose, Bld: 105 mg/dL — ABNORMAL HIGH (ref 70–99)
HCT: 37.2 % — ABNORMAL LOW (ref 39.0–52.0)
HDL: 27 mg/dL — ABNORMAL LOW (ref >39.00)
Hemoglobin: 12.6 g/dL — ABNORMAL LOW (ref 13.0–17.0)
Lymphs Abs: 3.3 10*3/uL (ref 0.7–4.0)
MCV: 94.5 fL (ref 78.0–100.0)
Monocytes Absolute: 1 10*3/uL (ref 0.1–1.0)
Monocytes Relative: 8.8 % (ref 3.0–12.0)
Neutro Abs: 7.3 10*3/uL (ref 1.4–7.7)
Platelets: 189 10*3/uL (ref 150.0–400.0)
RDW: 15.7 % — ABNORMAL HIGH (ref 11.5–14.6)
Sodium: 139 meq/L (ref 135–145)
Total Protein: 6.2 g/dL (ref 6.0–8.3)
Triglycerides: 124 mg/dL (ref 0.0–149.0)
VLDL: 24.8 mg/dL (ref 0.0–40.0)
Vit D, 25-Hydroxy: 41 ng/mL (ref 30–89)

## 2010-01-29 ENCOUNTER — Ambulatory Visit: Payer: Self-pay | Admitting: Family Medicine

## 2010-01-29 LAB — HM SIGMOIDOSCOPY

## 2010-02-06 ENCOUNTER — Ambulatory Visit: Payer: Self-pay | Admitting: Internal Medicine

## 2010-02-11 HISTORY — PX: ANTERIOR CERVICAL DISCECTOMY: SHX1160

## 2010-02-13 ENCOUNTER — Ambulatory Visit: Payer: Self-pay | Admitting: Internal Medicine

## 2010-02-14 ENCOUNTER — Ambulatory Visit: Payer: Self-pay | Admitting: Family Medicine

## 2010-02-14 DIAGNOSIS — G609 Hereditary and idiopathic neuropathy, unspecified: Secondary | ICD-10-CM | POA: Insufficient documentation

## 2010-02-18 ENCOUNTER — Ambulatory Visit: Payer: Self-pay | Admitting: Internal Medicine

## 2010-02-21 ENCOUNTER — Inpatient Hospital Stay (HOSPITAL_COMMUNITY): Admission: RE | Admit: 2010-02-21 | Discharge: 2010-02-22 | Payer: Self-pay | Admitting: Neurological Surgery

## 2010-03-04 ENCOUNTER — Encounter: Admission: RE | Admit: 2010-03-04 | Discharge: 2010-03-04 | Payer: Self-pay | Admitting: Neurological Surgery

## 2010-03-04 ENCOUNTER — Inpatient Hospital Stay (HOSPITAL_COMMUNITY): Admission: AD | Admit: 2010-03-04 | Discharge: 2010-03-10 | Payer: Self-pay | Admitting: Neurological Surgery

## 2010-03-13 ENCOUNTER — Encounter (INDEPENDENT_AMBULATORY_CARE_PROVIDER_SITE_OTHER): Payer: Self-pay | Admitting: *Deleted

## 2010-03-24 ENCOUNTER — Telehealth (INDEPENDENT_AMBULATORY_CARE_PROVIDER_SITE_OTHER): Payer: Self-pay | Admitting: *Deleted

## 2010-03-26 ENCOUNTER — Encounter: Payer: Self-pay | Admitting: Family Medicine

## 2010-03-27 ENCOUNTER — Telehealth: Payer: Self-pay | Admitting: Family Medicine

## 2010-03-28 ENCOUNTER — Ambulatory Visit: Payer: Self-pay | Admitting: Family Medicine

## 2010-03-28 DIAGNOSIS — K5909 Other constipation: Secondary | ICD-10-CM | POA: Insufficient documentation

## 2010-04-02 ENCOUNTER — Ambulatory Visit: Payer: Self-pay | Admitting: Family Medicine

## 2010-04-02 DIAGNOSIS — M25579 Pain in unspecified ankle and joints of unspecified foot: Secondary | ICD-10-CM | POA: Insufficient documentation

## 2010-04-02 DIAGNOSIS — S8263XA Displaced fracture of lateral malleolus of unspecified fibula, initial encounter for closed fracture: Secondary | ICD-10-CM | POA: Insufficient documentation

## 2010-04-02 DIAGNOSIS — S92309A Fracture of unspecified metatarsal bone(s), unspecified foot, initial encounter for closed fracture: Secondary | ICD-10-CM

## 2010-04-02 DIAGNOSIS — S92919A Unspecified fracture of unspecified toe(s), initial encounter for closed fracture: Secondary | ICD-10-CM | POA: Insufficient documentation

## 2010-04-02 DIAGNOSIS — S92213A Displaced fracture of cuboid bone of unspecified foot, initial encounter for closed fracture: Secondary | ICD-10-CM | POA: Insufficient documentation

## 2010-04-02 DIAGNOSIS — S92209A Fracture of unspecified tarsal bone(s) of unspecified foot, initial encounter for closed fracture: Secondary | ICD-10-CM | POA: Insufficient documentation

## 2010-04-02 DIAGNOSIS — M79609 Pain in unspecified limb: Secondary | ICD-10-CM

## 2010-04-03 ENCOUNTER — Ambulatory Visit: Payer: Self-pay | Admitting: Family Medicine

## 2010-04-16 ENCOUNTER — Encounter: Payer: Self-pay | Admitting: Internal Medicine

## 2010-04-16 ENCOUNTER — Telehealth: Payer: Self-pay | Admitting: Family Medicine

## 2010-04-16 ENCOUNTER — Ambulatory Visit
Admission: RE | Admit: 2010-04-16 | Discharge: 2010-04-16 | Payer: Self-pay | Source: Home / Self Care | Attending: Internal Medicine | Admitting: Internal Medicine

## 2010-04-17 ENCOUNTER — Encounter: Payer: Self-pay | Admitting: Family Medicine

## 2010-04-21 ENCOUNTER — Ambulatory Visit
Admission: RE | Admit: 2010-04-21 | Discharge: 2010-04-21 | Payer: Self-pay | Source: Home / Self Care | Attending: Family Medicine | Admitting: Family Medicine

## 2010-05-02 ENCOUNTER — Ambulatory Visit
Admission: RE | Admit: 2010-05-02 | Discharge: 2010-05-02 | Payer: Self-pay | Source: Home / Self Care | Attending: Family Medicine | Admitting: Family Medicine

## 2010-05-05 ENCOUNTER — Encounter
Admission: RE | Admit: 2010-05-05 | Discharge: 2010-05-05 | Payer: Self-pay | Source: Home / Self Care | Attending: Neurological Surgery | Admitting: Neurological Surgery

## 2010-05-07 ENCOUNTER — Ambulatory Visit
Admission: RE | Admit: 2010-05-07 | Discharge: 2010-05-07 | Payer: Self-pay | Source: Home / Self Care | Attending: Internal Medicine | Admitting: Internal Medicine

## 2010-05-07 ENCOUNTER — Ambulatory Visit
Admission: RE | Admit: 2010-05-07 | Discharge: 2010-05-07 | Payer: Self-pay | Source: Home / Self Care | Attending: Family Medicine | Admitting: Family Medicine

## 2010-05-07 ENCOUNTER — Telehealth: Payer: Self-pay | Admitting: Family Medicine

## 2010-05-07 ENCOUNTER — Encounter: Payer: Self-pay | Admitting: Internal Medicine

## 2010-05-07 ENCOUNTER — Encounter: Payer: Self-pay | Admitting: Family Medicine

## 2010-05-09 ENCOUNTER — Telehealth: Payer: Self-pay | Admitting: Family Medicine

## 2010-05-09 ENCOUNTER — Encounter: Payer: Self-pay | Admitting: Family Medicine

## 2010-05-12 ENCOUNTER — Encounter: Payer: Self-pay | Admitting: Family Medicine

## 2010-05-12 ENCOUNTER — Telehealth: Payer: Self-pay | Admitting: Family Medicine

## 2010-05-15 NOTE — Assessment & Plan Note (Signed)
Summary: 2 WK F/U 30 MIN PER MD/DLO   Vital Signs:  Patient profile:   77 year old male Height:      64.5 inches Weight:      194.0 pounds BMI:     32.90 Temp:     97.7 degrees F oral Pulse rate:   76 / minute Pulse rhythm:   regular BP sitting:   90 / 60  (left arm) Cuff size:   regular  Vitals Entered By: Benny Lennert CMA Duncan Dull) (February 14, 2010 10:54 AM)  History of Present Illness: Chief complaint 2 wk follow up   Weakness in arms and legs.Johnny Kitchendiffusely.Your physician has requested that you have the following labwork done today: work up negative. Had been going on priro to below neck issues...for years  In last 6 months  with symptoms of ? rupture disc in neck, with radiculopathy.  Treated with prednisone course.  Heped some with neck, still some pain. No change in weakness in arms and legs with prednisone. Per daughter at this OV...weakness in last 2 weeks much worse. Trouble dressing himself and legs givening out. Very low energy.  Dropping thing in left hand.  Saw Dr. Derl Barrow last week..no clear cardiac cause per him of weakness. Dr Derl Barrow referresd him to Dr. Yetta Barre neurosurgeon for further eval of his neck to assure no compression of spinal cord etc.   Legs jumping around some at night.  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)  (ICD-755.66) 12)  Back Pain With Radiculopathy/r Hip Pain  (ICD-729.2) 13)  Hand Pain, Right  (ICD-729.5) 14)   Mitral Regurgitation/ Mod-severe  (ICD-396.3) 15)  Aftercare, Long-term Use, Medications Nec  (ICD-V58.69) 16)  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) 17)  Cardiomyopathy, Primary, Resolved  (ICD-425.4) 18)  Pneumonia, Hx of  (ICD-V12.60) 19)  Pulmonary Nodule, Right Lower Lobe  (ICD-518.89) 20)  Atrophy, 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 One Inhale Every Morning 2)  Flomax 0.4 Mg Cp24 (Tamsulosin Hcl) .[name].. 1 Daily By Mouth 3)  Protonix 40 Mg Tbec (Pantoprazole Sodium) .... Take 1 Tablet By Mouth Once A Day 4)  Furosemide 40 Mg Tabs (Furosemide) .... Take One Tablet By Mouth Daily. 5)  Coreg 12.5 Mg Tabs (Carvedilol) .[name].. 1 By Mouth Two Times A Day 6)  Iron 325 (65 Fe) Mg Tabs (Ferrous Sulfate) .... 2 in The Morning and 2 in The Evening 7)  Amaryl 4 Mg Tabs (Glimepiride) .... One Tab By Mouth Daily 8)  Accu-Chek Aviva   Strp (Glucose Blood) .... Use 1 Daily and As Needed For Diabeties 9)  Adprin B 325 Mg Tabs (Aspirin Buf(Cacarb-Mgcarb-Mgo)) .Johnny KitchenMarland KitchenMarland Diaz  1 Daily By Mouth 10)  Colcrys 0.6 Mg Tabs (Colchicine) .[name].. 1 Tab By Mouth Two Times A Day As Needed Gout Pain 11)  Norvasc 2.5 Mg Tabs (Amlodipine Besylate) .[name].. 1 By Mouth Daily 12)  Eplerenone 25 Mg Tabs (Eplerenone) .[name].. 1 By Mouth Daily 13)  Lisinopril 20 Mg Tabs (Lisinopril) .... 1/2  Tablet Two Times A Day 14)  Lipitor 20 Mg Tabs (Atorvastatin Calcium) .[name].. 1 Daily By Mouth At Bedtime 15)  Klor-Con M20 20 Meq Cr-Tabs (Potassium Chloride Crys Cr) .[name].. 1 Tablet Twice A Day By Mouth 16)  Avodart 0.5 Mg Caps (Dutasteride) .... One By  Mouth Daily 17)  Vitamin B-12 2500 Mcg Subl (Cyanocobalamin) .... Take One By Mouth Daily 18)  Vitamin D3 2000 Unit Caps (Cholecalciferol) .[name].. 1 By Mouth Daily 19)  Gabapentin 100 Mg Caps (Gabapentin) .[name].. 1 Tab By Mouth At Bedtime For Peripharal Neuropathy.  Allergies (verified): No Known Drug Allergies  Past History:  Past medical, surgical, family and social histories (including risk factors) reviewed, and no changes noted (except as noted below).  Past Medical History: Reviewed history from 05/30/2009 and no changes required.  1. CHF secondary to nonischemic  cardiomyopathy      a.    ECHO 10/07, EF 20-25%, moderate to severe MR      b.    ECHO 1/10, EF 55-60%, mild MR      c.    ECHO  2/11 55-65%       c.    s/p Medtronic BiV ICD with biventricular function now turned        off due to diahragmatic stimulatiob      d.     Right heart catheterization November 05, 2006.  Right atrial        pressure mean of 12, RV pressure 36/8, PA pressure 39/16 with a        mean of 28, wedge pressure was 20.  Fick cardiac output was 5        liters per minute, cardiac index was 2.4.   2. Cardiac cath 01/2006, showed mild nonobstructive CAD  3. COPD GOLD II    - Spirometry July 10, 2008 > FEV1 1.46 56% predicted, ratio of 66%  4. Atrial fibrillation status post AV node ablation       a. not on coumadin due to GIB  5. Iron-deficiency anemia with previous severe GI bleed   6. Hypertension.   7. Hyperlipidemia.   8. Left bundle branch block  9. Diabetes 10.Benign prostatic hypertrophy 09/11/1997 11.Pneumonia, hx of 2-3 times as child 12.Chronic dyspnea 13. Obesity 14. Aborted VF arrest (shocked by ICD) November 2009  Past Surgical History: Reviewed history from 06/20/2008 and no changes required. Lung-lobectomy RLL 01/21/1998 EGD prepyloric ulcer, esoph ring, duod avm 09/1997 Colonoscopy polypectomy, divertics, int hemms 11/1997 [facility] GI bleed 02/07-02/12/2002 EGD poss Barrett's  05/22/2002 Colonoscopy polyp,multip(neg) divertics,int hemm 06/26/2002 Colonoscopy polyps, divetics, int hemms 07/10/2004 [facility] SOB, AFIB, CHF 10/11-10/18/2007 Cath min obstruct dz severe LV dysftn EF 25% [facility] acute blood loss, anemia, sys HF, isch cardiomyopathy 11/08-11/03/2006 EGD HH No active bleeding 02/19/2006 Rotator cuff repair 1994 and 1995 L knee surgery 1996 CT Head   No acute abnmlty  10/03/2006 R heart Cath nml (Dr Bensihmon) 11/05/2006 [facility]OS[facility] Acute on Chronis CHF IIIB Nonisch Cardiomyop EF 20-25% Mod-Sev MR 8/14-8/23/08 Colonoscopy 2 Polyps Divertics Int Hemms (Dr Marina Goodell) 06/19/08    Family History: Reviewed history from 07/31/2009 and no changes required. Father dec 69  CHF (Smoker) Mother dec 81 CHF Brother A 41 Allyne Gee)  Lung Ca Brother A 67 (Burnie) Liver ETOH COPD (disability) Brother A 59 (Doug) Nerves (disability) COPD Brother A 95 Peyton Najjar) Sister A 94 HTN Sister A 66 HTN Obesity  Social History: Reviewed history from 07/10/2008 and no changes required. Occupation: [employer] Retired Married lives w/ wife 1 adopted Current smoker.  Smoker since age 9.  Quit for 2 years and started back in Dec 2009.  Smokes 1 ppd. Alcohol use-yes Drug use-no  Review of Systems General:  Complains of fatigue; denies fever. CV:  Denies chest pain or discomfort. Resp:  Denies shortness of breath. GI:  Denies abdominal pain. GU:  Denies dysuria.  Physical Exam  General:  elderly male in NAD Mouth:  Oral mucosa and oropharynx without lesions or exudates. MMM, dentures Neck:  no cervical or supraclavicular lymphadenopathy  no carotid bruit or thyromegaly  Lungs:  slightly diminished lung sounds in the lower bases,no wheezes, prolonged I:E ratio Heart:  Normal rate and regular rhythm. S1 and S2 normal without gallop, click, rub or other extra sounds. II-III/VI sys murmur precordially, heard best at upper right  sternal border. Abdomen:  Bowel sounds positive,abdomen soft and  non-tender without masses, organomegaly or hernias noted. Msk:  no vertebral ttp  strenght 5/5  lower extrremeties 4/5 in right upper extremety, 3/5 in left upper ext Decreased grip strenght B. Unable to  easily raise left arm above head. Neg impingement sign, neg Neer's, neg drop arm test.  Pulses:  R and L posterior tibial pulses are full and equal bilaterally  Extremities:  NO edema Neurologic:  alert & oriented X3, cranial nerves II-XII intact, and sensation intact to light touch.   GAit slowed  Decreased monofilament B soles of feet.   Impression & Recommendations:  Problem # 1:  NECK  WITH LEFT ARM RADICULOPATHY (ICD-723.1) Likly herniated disc in cervical spine..some improvement in pain with prednisone, but continued weakness. (Of not he has significant edema and fluid gain with prednisone, will need to be careful this med does not trigger CHF exacerbation if needed in furture)   Pain still interfering with sleep.  Agree with referral to neurosurgeon.  Given significantly progressive change  in last 2 weeks...will get CT cervical spine (MRI contraindicated) to determine if we need to move neurosurgeon appt closer than 3 weeks. His updated medication list for this problem includes:    Adprin B 325 Mg Tabs (Aspirin buf(cacarb-mgcarb-mgo)) .[name]... 1 daily by mouth  Orders: Radiology Referral (Radiology)  Problem # 2:  FATIGUE / MALAISE (ICD-780.79) No clear suggestion fatigue and weakness due to Polymyalgia rheumatica since weakness not better with prednisone. LAb work up negative so far.   Problem # 3:  PERIPHERAL NEUROPATHY (ICD-356.9) Chronic finding..? due to DM.  Will start neurontin for this symtoms as well as for pain related to #1.   Complete Medication List: 1)  Spiriva Handihaler 18 Mcg Caps (Tiotropium bromide monohydrate) .... One inhale every morning 2)  Flomax 0.4 Mg Cp24 (Tamsulosin hcl) .[name].. 1 daily by mouth 3)  Protonix 40 Mg Tbec (Pantoprazole sodium) ....  Take 1 tablet by mouth once a day 4)  Furosemide 40 Mg Tabs (Furosemide) .... Take one tablet by mouth daily. 5)  Coreg 12.5 Mg Tabs (Carvedilol) .[name].. 1 by mouth two times a day 6)  Iron 325 (65 Fe) Mg Tabs (Ferrous sulfate) .... 2 in the morning and 2 in the evening 7)  Amaryl 4 Mg Tabs (  Glimepiride) .... One tab by mouth daily 8)  Accu-chek Aviva Strp (Glucose blood) .... Use 1 daily and as needed for diabeties 9)  Adprin B 325 Mg Tabs (Aspirin buf(cacarb-mgcarb-mgo)) .[name].. 1 daily by mouth 10)  Colcrys 0.6 Mg Tabs (Colchicine) .[name].. 1 tab by mouth two times a day as needed gout pain 11)  Norvasc 2.5 Mg Tabs (Amlodipine besylate) .[name].. 1 by mouth daily 12)  Eplerenone 25 Mg Tabs (Eplerenone) .[name].. 1 by mouth daily 13)  Lisinopril 20 Mg Tabs (Lisinopril) .... 1/2  tablet two times a day 14)  Lipitor 20 Mg Tabs (Atorvastatin calcium) .[name].. 1 daily by mouth at bedtime 15)  Klor-con M20 20 Meq Cr-tabs (Potassium chloride crys cr) .[name].. 1 tablet twice a day by mouth 16)  Avodart 0.5 Mg Caps (Dutasteride) .... One by mouth daily 17)  Vitamin B-12 2500 Mcg Subl (Cyanocobalamin) .... Take one by mouth daily 18)  Vitamin D3 2000 Unit Caps (Cholecalciferol) .[name].. 1 by mouth daily 19)  Gabapentin 100 Mg Caps (Gabapentin) .[name].. 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  Patient Instructions: 1)  Keep appt with Dr Yetta Barre on 11/21. 2)  Start neurontin at bedtime 3)  Referral Appointment Information 4)  Day/Date: 5)  Time: 6)  Place/MD: 7)  Address: 8)  Phone/Fax: 9)  Patient given appointment information. Information/Orders faxed/mailed.  Prescriptions: COLCRYS 0.6 MG TABS (COLCHICINE) 1 tab by mouth two times a day as needed gout pain  #180 x 3   Entered and Authorized by:   Kerby Nora MD   Signed by:   Kerby Nora MD on 02/14/2010   Method used:   Electronically to        [facility]* (retail)             ,          Ph: [phone]       Fax: [phone]   RxID:    [id number] GABAPENTIN 100 MG CAPS (GABAPENTIN) 1 tab by mouth at bedtime for peripharal neuropathy.  #90 x 3   Entered and Authorized by:   Kerby Nora MD   Signed by:   Kerby Nora MD on 02/14/2010   Method used:   Electronically to        [facility]* (retail)             ,          Ph: [phone]       Fax: [phone]   RxID:   [id number] AVODART 0.5 MG CAPS (DUTASTERIDE) one by mouth daily  #90 x 3   Entered and Authorized by:   Kerby Nora MD   Signed by:   Kerby Nora MD on 02/14/2010   Method used:   Electronically to        [facility]* (retail)             ,          Ph: [phone]       Fax: [phone]   RxID:   [id number] KLOR-CON M20 20 MEQ CR-TABS (POTASSIUM CHLORIDE CRYS CR) 1 tablet twice a day by mouth  #180 x 3   Entered and Authorized by:   Kerby Nora MD   Signed by:   Kerby Nora MD on 02/14/2010   Method used:   Electronically to        [facility]* (retail)             ,  Ph: [phone]       Fax: [phone]   RxID:   [id number] LIPITOR 20 MG TABS (ATORVASTATIN CALCIUM) 1 daily by mouth at bedtime  #90 Tablet x 2   Entered and Authorized by:   Kerby Nora MD   Signed by:   Kerby Nora MD on 02/14/2010   Method used:   Electronically to        [facility]* (retail)             ,          Ph: [phone]       Fax: [phone]   RxID:   [id number] LISINOPRIL 20 MG TABS (LISINOPRIL) 1/2  tablet two times a day  #90 x 3   Entered and Authorized by:   Kerby Nora MD   Signed by:   Kerby Nora MD on 02/14/2010   Method used:   Electronically to        [facility]* (retail)             ,          Ph: [phone]       Fax: [phone]   RxID:   [id number] EPLERENONE 25 MG TABS (EPLERENONE) 1 by mouth daily  #90 x 3   Entered and Authorized by:   Kerby Nora MD   Signed by:   Kerby Nora MD on 02/14/2010   Method used:   Electronically to        [facility]* (retail)              ,          Ph: [phone]       Fax: [phone]   RxID:   [id number] NORVASC 2.5 MG TABS (AMLODIPINE BESYLATE) 1 by mouth daily  #90 Tablet x 2   Entered and Authorized by:   Kerby Nora MD   Signed by:   Kerby Nora MD on 02/14/2010   Method used:   Electronically to        [facility]* (retail)             ,          Ph: [phone]       Fax: [phone]   RxID:   [id number] AMARYL 4 MG TABS (GLIMEPIRIDE) ONE TAB by mouth daily  #90 x 3   Entered and Authorized by:   Kerby Nora MD   Signed by:   Kerby Nora MD on 02/14/2010   Method used:   Electronically to        [facility]* (retail)             ,          Ph: [phone]       Fax: [phone]   RxID:   [id number] COREG 12.5 MG TABS (CARVEDILOL) 1 by mouth two times a day  #180 x 3   Entered and Authorized by:   Kerby Nora MD   Signed by:   Kerby Nora MD on 02/14/2010   Method used:   Electronically to        [facility]* (retail)             ,          Ph: [phone]       Fax: [phone]   RxID:   [id number] FUROSEMIDE 40 MG TABS (FUROSEMIDE) Take one tablet by mouth daily.  #90 x 3   Entered  and Authorized by:   Kerby Nora MD   Signed by:   Kerby Nora MD on 02/14/2010   Method used:   Electronically to        [facility]* (retail)             ,          Ph: [phone]       Fax: [phone]   RxID:   [id number] PROTONIX 40 MG TBEC (PANTOPRAZOLE SODIUM) Take 1 tablet by mouth once a day  #90 Tablet x 3   Entered and Authorized by:   Kerby Nora MD   Signed by:   Kerby Nora MD on 02/14/2010   Method used:   Electronically to        [facility]* (retail)             ,          Ph: [phone]       Fax: [phone]   RxID:   [id number] FLOMAX 0.4 MG CP24 (TAMSULOSIN HCL) 1 daily by mouth Brand medically necessary #90 x 3   Entered and Authorized by:   Kerby Nora MD   Signed by:   Kerby Nora MD on 02/14/2010   Method used:   Electronically to         [facility]* (retail)             ,          Ph: [phone]       Fax: [phone]   RxID:   [id number] SPIRIVA HANDIHALER 18 MCG CAPS (TIOTROPIUM BROMIDE MONOHYDRATE) ONE INHALE EVERY MORNING  #10 PACKS x 4   Entered and Authorized by:   Kerby Nora MD   Signed by:   Kerby Nora MD on 02/14/2010   Method used:   Electronically to        [facility]* (retail)             ,          Ph: [phone]       Fax: [phone]   RxID:   [id number] GABAPENTIN 100 MG CAPS (GABAPENTIN) 1 tab by mouth at bedtime for peripharal neuropathy.  #30 x 1   Entered and Authorized by:   Kerby Nora MD   Signed by:   Kerby Nora MD on 02/14/2010   Method used:   Electronically to        [facility]  [address] (retail)       [address]       [place], [place]  [address]       Ph: [phone] or [phone]       Fax: [phone]   RxID:   [id number]    Orders Added: 1)  Radiology Referral [Radiology] 2)  Est. Patient Level IV [31540]    Current Allergies (reviewed today): No known allergies

## 2010-05-15 NOTE — Procedures (Signed)
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)  (ICD-755.66) 9)  Back Pain With Radiculopathy/r Hip Pain  (ICD-729.2) 10)  Syncope  (ICD-780.2) 11)  Hand Pain, Right  (ICD-729.5) 12)  Uri  (ICD-465.9) 13)  Uti  (ICD-599.0) 14)  Mitral Regurgitation/ Mod-severe  (ICD-396.3) 15)  Aftercare, Long-term Use, Medications Nec  (ICD-V58.69) 16)  CHF  (ICD-428.0) 17)  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) 18)  Failure, Systolic Heart, Acute On Chronic  (ICD-428.23) 19)  Cardiomyopathy  (ICD-425.4) 20)  Pneumonia, Hx of  (ICD-V12.60) 21)  Failure, Systolic Heart, Chronic Ef 25%  (ICD-428.22) 22)  Muscle Cramps  (ICD-729.82) 23)  Pulmonary Nodule, Right Lower Lobe  (ICD-518.89) 24)  Atrophy, 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 One Inhale Every Morning 2)  Flomax 0.4 Mg Cp24 (Tamsulosin Hcl) .Marland Kitchen.. 1 Daily By Mouth 3)  Protonix 40 Mg Tbec (Pantoprazole Sodium) .... Take 1 Tablet By Mouth Once A Day 4)  Lasix 80 Mg Tabs (Furosemide) .... 1/2 Tablet By Mouth in Am 5)  Coreg 12.5 Mg Tabs (Carvedilol) .Marland Kitchen.. 1 By Mouth Two Times A Day 6)  Iron 325  (65 Fe) Mg Tabs (Ferrous Sulfate) .... 2 in The Morning and 2 in The Evening 7)  Amaryl 4 Mg Tabs (Glimepiride) .... One Tab By Mouth Qd 8)  Accu-Chek Aviva   Strp (Glucose Blood) .... Use 1 Daily and As Needed For Diabeties 9)  Adprin B 325 Mg Tabs (Aspirin Buf(Cacarb-Mgcarb-Mgo)) .Marland Kitchen.. 1 Daily By Mouth 10)  Colchicine 0.6 Mg Tabs (Colchicine) .Marland Kitchen.. 1 Twice A Day As Needed For Gout Pain 11)  Norvasc 2.5 Mg Tabs (Amlodipine Besylate) .Marland Kitchen.. 1 By Mouth Daily 12)  Eplerenone 25 Mg Tabs (Eplerenone) .Marland Kitchen.. 1 By Mouth Daily 13)  Lisinopril 20 Mg Tabs (Lisinopril) .... 1/2  Tablet Two Times A Day 14)  Lipitor 20 Mg Tabs (Atorvastatin Calcium) .Marland Kitchen.. 1 Daily By Mouth At Bedtime 15)  Klor-Con M20 20 Meq Cr-Tabs (Potassium Chloride Crys Cr) .Marland Kitchen.. 1 Tablet Twice A Day By Mouth 16)  Avodart 0.5 Mg Caps (Dutasteride) .... One By Mouth Daily 17)  Vitamin B-12 2500 Mcg Subl (Cyanocobalamin) .... Take One By Mouth Daily  Allergies (verified): No Known Drug Allergies  Vital Signs:  Patient profile:   77 year old male Height:      64.50 inches Pulse rate:   72 / minute Pulse rhythm:   regular BP sitting:   106 / 62  (left arm) Cuff size:   regular  Vitals Entered By: Mercer Pod (April 15, 2009 9:32 AM)    ICD Specifications Following MD:  Sherryl Manges, MD     ICD Vendor:  [employer]  ICD Model Number:  7299     ICD Serial Number:  [id number] ICD DOI:  08/24/2006     ICD Implanting MD:  Sherryl Manges, MD  Lead 1:    Location: RA     DOI: 08/24/2006     Model #: 8657     Serial #: [id number]     Status: active Lead 2:    Location: RV     DOI: 08/24/2006     Model #: [id number]     Serial #: [id number]     Status: active Lead 3:    Location: LV     DOI: 08/24/2006     Model #: 3664     Serial #: [id number]     Status: active  Indications::  NICM, CHF   ICD Follow Up Remote Check?  No Battery Voltage:  2.62 V     Charge Time:  10.77 seconds     Battery Est. Longevity:  ERI Underlying rhythm:   A-fib/dependent ICD Dependent:  Yes       ICD Device Measurements Atrium:  Amplitude: 0.3 mV, Impedance: 512 ohms,  Right Ventricle:  Impedance: 656 ohms, Threshold: 1.0 V at 0.2 msec Left Ventricle:  Impedance: 472 ohms, Threshold: 2.0 V at 1.6 msec Shock Impedance: 42/50 ohms   Episodes Coumadin:  No Shock:  0     ATP:  0     Nonsustained:  0     Ventricular Pacing:  98.7%  Brady Parameters Mode VVIR     Lower Rate Limit:  70     Upper Rate Limit 120  Tachy Zones VF:  200     VT:  250     VT1:  171     Tech Comments:  The patient was last seen in the office on 12/31/2008 at which time his battery voltage was 2.64.  On 01/09/2009 he reached ERI and did not hear the alert tones.   Today his battery is 2.62 and he is pacemaker dependent.  His optivol was elevated 12/1-12/10.   Altha Harm, LPN  April 15, 2009 9:19 AM

## 2010-05-15 NOTE — Miscellaneous (Signed)
Summary: clinic update  Clinical Lists Changes  Medications: Changed medication from LASIX 80 MG TABS (FUROSEMIDE) 1/2 tablet by mouth in am to FUROSEMIDE 40 MG TABS (FUROSEMIDE) Take one tablet by mouth daily.

## 2010-05-15 NOTE — Assessment & Plan Note (Signed)
Summary: 2 wk f/u per Kensy Blizard/dlo   Vital Signs:  Patient profile:   77 year old male Height:      64.5 inches Weight:      187.75 pounds BMI:     31.84 Temp:     97.4 degrees F oral Pulse rate:   72 / minute Pulse rhythm:   regular BP sitting:   100 / 60  (left arm) Cuff size:   regular  Vitals Entered By: Benny Lennert CMA Duncan Dull) (April 21, 2010 8:28 AM)  History of Present Illness: Chief complaint 2 week follow up fracture of left foot  Left foot fracture follow-up  L cuboid fracture, R toe fracture, and small L talus fracture, with an additional avulsion fracture of the L lateral malleolus / distal fibula.  The patient has poor sensation due to neurological trauma and spinal compromise with neuropathy.  Has been non-compliant, stopped wearing his boot earlier, b/c slipped once while wearing it and almost fell.  He has been weightbearing. (asked to be NWB)  Overall, though, he is actually doing well, tolerating his situation well with minimal pain and swelling.     Allergies (verified): No Known Drug Allergies  Physical Exam  General:  Well-developed,well-nourished,in no acute distress; alert,appropriate and cooperative throughout examination Msk:  right foot:much improved around toes Nontender along all metatarsals, and all bones of the forefoot, midfoot, and ankle.  Left foot: Nontender with fibular squeeze. Nontender at the fibular proximal or tibia proximally.  Distal fibula is mildly  tender.  Medial malleolus is nontender.  Talus now NT  Midfoot is now NT. Nontender along all metatarsals including the fifth metatarsal head.  Nontender along all phalanges and with good phalanges motion.   Impression & Recommendations:  Problem # 1:  CLOSED FRACTURE OF LATERAL MALLEOLUS (ICD-824.2)  Despite his non-compliance, he has done well. His neuropathic impairment likely plays a role in his ability to walk.  I placed him in an aircast ankle brace. He  refused to wear a CAM walker or cast any longer. Patient was placed in an Left sided Aircast, standard. Given a Ankle Aircast kit with Theraband, Aircast, and DVD that reviews ankle rehab in detail.  Pneumatic compression has clearly been demonstrated to decrease swelling, aid in proprioception long term, improve recovery time and decrease time in physical therapy.   Overall, pleased with progress. recheck in 2 weeks.  A UNIVERSAL FRACTURE CHARGE HAS BEEN APPLIED IN THE CARE OF THIS INJURY.  No co-pay should be applied in the future, and there is a 90 day follow-up period for subsequent care of this injury.   Orders: No Charge Patient Arrived (NCPA0) (NCPA0)  Problem # 2:  CLOSED FRACTURE OF CUBOID BONE (ICD-825.23)  Orders: No Charge Patient Arrived (NCPA0) (NCPA0)  Problem # 3:  FRACTURE, TOE (ICD-826.0)  Orders: No Charge Patient Arrived (NCPA0) (NCPA0)  Problem # 4:  OTHER CLOSED FRACTURE OF TARSAL&METATARSAL BONES (ICD-825.29)  Orders: No Charge Patient Arrived (NCPA0) (NCPA0)  Problem # 5:  FOOT PAIN, RIGHT (ICD-729.5)  Orders: No Charge Patient Arrived (NCPA0) (NCPA0)  Complete Medication List: 1)  Spiriva Handihaler 18 Mcg Caps (Tiotropium bromide monohydrate) .... One inhale every morning 2)  Flomax 0.4 Mg Cp24 (Tamsulosin hcl) .Marland Kitchen.. 1 daily by mouth 3)  Protonix 40 Mg Tbec (Pantoprazole sodium) .... Take 1 tablet by mouth once a day 4)  Furosemide 40 Mg Tabs (Furosemide) .... Take one tablet by mouth daily. 5)  Coreg 12.5 Mg Tabs (Carvedilol) .Marland KitchenMarland KitchenMarland Kitchen  Take 1/2 tablet by mouth two times a day 6)  Iron 325 (65 Fe) Mg Tabs (Ferrous sulfate) .... 2 in the morning and 2 in the evening 7)  Amaryl 4 Mg Tabs (Glimepiride) .... One tab by mouth daily 8)  Accu-chek Aviva Strp (Glucose blood) .... Use 1 daily and as needed for diabeties 9)  Adprin B 325 Mg Tabs (Aspirin buf(cacarb-mgcarb-mgo)) .Marland Kitchen.. 1 daily by mouth 10)  Colcrys 0.6 Mg Tabs (Colchicine) .Marland Kitchen.. 1 tab by mouth two times  a day as needed gout pain 11)  Eplerenone 25 Mg Tabs (Eplerenone) .... Take 1/2 by mouth daily 12)  Lisinopril 20 Mg Tabs (Lisinopril) .... 1/2  tablet once daily. 13)  Lipitor 20 Mg Tabs (Atorvastatin calcium) .Marland Kitchen.. 1 daily by mouth at bedtime 14)  Klor-con M20 20 Meq Cr-tabs (Potassium chloride crys cr) .Marland Kitchen.. 1 tablet twice a day by mouth 15)  Avodart 0.5 Mg Caps (Dutasteride) .... One by mouth daily 16)  Vitamin B-12 2500 Mcg Subl (Cyanocobalamin) .... Take one by mouth daily 17)  Vitamin D3 2000 Unit Caps (Cholecalciferol) .Marland Kitchen.. 1 by mouth daily 18)  Gabapentin 100 Mg Caps (Gabapentin) .Marland Kitchen.. 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 19)  Wheelchair, Standard Rolling  .... Length of need = 99 years (perm) dx codes: 825.23, 824.2, 826.0, v45.02  Patient Instructions: 1)  recheck in 2 weeks 2)  KEEP FOOT ELEVATED AT NIGHT 3)  WEAR AIRCAST DURING THE DAYTIME WHEN UP AND ABOUT.   Orders Added: 1)  No Charge Patient Arrived (NCPA0) [NCPA0]    Current Allergies (reviewed today): No known allergies

## 2010-05-15 NOTE — Assessment & Plan Note (Signed)
Summary: TRANSFER FROM DR SCHALLER/WEAK/CLE   Vital Signs:  Patient profile:   77 year old male Height:      64.5 inches Weight:      189.0 pounds BMI:     32.06 Temp:     97.8 degrees F oral Pulse rate:   76 / minute Pulse rhythm:   regular BP sitting:   90 / 60  (left arm) Cuff size:   regular  Vitals Entered By: Benny Lennert CMA Duncan Dull) (January 29, 2010 8:39 AM)  History of Present Illness: Chief complaint transfer from dr schaller  [name] is a 77 year old male with a history of a congestive heart failure secondary to nonischemic cardiac myopathy with a recovered ejection fraction he is also has h/o atrial fib status post AV node ablation and  BIV ICD implantation. Remainder of medical history is notable for COPD, diabetes, hypertension and hyperlipidemia. Cards is Dr. Derl Barrow.  He is here today to establish from Dr. Hetty Ely.  DM, well controlled on amaryl  High cholesterol: well controlled on lipitor. Nml LFTS.  HTN, well controleld on norvasc, lisinopril, coreg, furosemide.  BPH...on flowmax and avodart  COPD: stable on spiriva Longterm smoker...quit for 2 years..restartd   Has not been feeling very well..very weak since heart surgery in 2007.   Hasleft  neck pain, tingling goes down left arm...awoke one morning with pain months ago. He noted after doing a lot of yard work with shears. If moves neck a certain way has some tingling occur. Left arm very weak..cannot raise above arm. Saw Dr. Hetty Ely about this in summer..had X-rays.  Never filled prednisone from last OV..miscommunication.  Hg stable on ferrous sulfate. Thyroid and vit D nml.  Vit B12 nml in 07/2009..on supplement.  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)  (ICD-755.66) 11)  Back Pain With Radiculopathy/r Hip Pain  (ICD-729.2) 12)  Hand Pain, Right  (ICD-729.5) 13)  Mitral Regurgitation/ Mod-severe  (ICD-396.3) 14)  Aftercare, Long-term Use, Medications Nec  (ICD-V58.69) 15)  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) 16)  Cardiomyopathy, Primary, Resolved  (ICD-425.4) 17)  Pneumonia, Hx of  (ICD-V12.60) 18)  Pulmonary Nodule, Right Lower Lobe  (ICD-518.89) 19)  Atrophy, 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 One Inhale Every Morning 2)  Flomax 0.4 Mg Cp24 (Tamsulosin Hcl) .Marland Kitchen.. 1 Daily By Mouth 3)  Protonix 40 Mg Tbec (Pantoprazole Sodium) .... Take 1 Tablet By Mouth Once A Day 4)  Furosemide 40 Mg Tabs (Furosemide) .... Take One Tablet By Mouth Daily. 5)  Coreg 12.5 Mg Tabs (Carvedilol) .Marland Kitchen.. 1 By Mouth Two Times A Day 6)  Iron 325 (65 Fe) Mg Tabs (Ferrous Sulfate) .... 2 in The Morning and  2 in The Evening 7)  Amaryl 4 Mg Tabs (Glimepiride) .... One Tab By Mouth Daily 8)  Accu-Chek Aviva   Strp (Glucose Blood) .... Use 1 Daily and As Needed For Diabeties 9)  Adprin B 325 Mg Tabs (Aspirin Buf(Cacarb-Mgcarb-Mgo)) .Marland Kitchen.. 1 Daily By Mouth 10)  Colchicine 0.6 Mg Tabs (Colchicine) .Marland Kitchen.. 1 Twice A Day As Needed For Gout Pain 11)  Norvasc 2.5 Mg Tabs (Amlodipine Besylate) .Marland Kitchen.. 1 By Mouth Daily 12)  Eplerenone 25 Mg Tabs  (Eplerenone) .Marland Kitchen.. 1 By Mouth Daily 13)  Lisinopril 20 Mg Tabs (Lisinopril) .... 1/2  Tablet Two Times A Day 14)  Lipitor 20 Mg Tabs (Atorvastatin Calcium) .Marland Kitchen.. 1 Daily By Mouth At Bedtime 15)  Klor-Con M20 20 Meq Cr-Tabs (Potassium Chloride Crys Cr) .Marland Kitchen.. 1 Tablet Twice A Day By Mouth 16)  Avodart 0.5 Mg Caps (Dutasteride) .... One By Mouth Daily 17)  Vitamin B-12 2500 Mcg Subl (Cyanocobalamin) .... Take One By Mouth Daily  Allergies (verified): No Known Drug Allergies  Past History:  Past medical, surgical, family and social histories (including risk factors) reviewed, and no changes noted (except as noted below).  Past Medical History: Reviewed history from 05/30/2009 and no changes required.  1. CHF secondary to nonischemic  cardiomyopathy      a.    ECHO 10/07, EF 20-25%, moderate to severe MR      b.    ECHO 1/10, EF 55-60%, mild MR      c.    ECHO  2/11 55-65%       c.    s/p Med[employer]onic BiV ICD with biventricular function now turned        off due to diahragmatic stimulatiob      d.     Right heart catheterization November 05, 2006.  Right atrial        pressure mean of 12, RV pressure 36/8, PA pressure 39/16 with a        mean of 28, wedge pressure was 20.  Fick cardiac output was 5        liters per minute, cardiac index was 2.4.   2. Cardiac cath 01/2006, showed mild nonobstructive CAD  3. COPD GOLD II    - Spirometry July 10, 2008 > FEV1 1.46 56% predicted, ratio of 66%  4. Atrial fibrillation status post AV node ablation       a. not on coumadin due to GIB  5. Iron-deficiency anemia with previous severe GI bleed   6. Hypertension.   7. Hyperlipidemia.   8. Left bundle branch block  9. Diabetes 10.Benign prostatic hypertrophy 09/11/1997 11.Pneumonia, hx of 2-3 times as child 12.Chronic dyspnea 13. Obesity 14. Aborted VF arrest (shocked by ICD) November 2009  Past Surgical History: Reviewed history from 06/20/2008 and no changes required. Lung-lobectomy RLL  01/21/1998 EGD prepyloric ulcer, esoph ring, duod avm 09/1997 Colonoscopy polypectomy, divertics, int hemms 11/1997 [facility] GI bleed 02/07-02/12/2002 EGD poss Barrett's 05/22/2002 Colonoscopy polyp,multip(neg) divertics,int hemm 06/26/2002 Colonoscopy polyps, divetics, int hemms 07/10/2004 [facility] SOB, AFIB, CHF 10/11-10/18/2007 Cath min obstruct dz severe LV dysftn EF 25% [facility] acute blood loss, anemia, sys HF, isch cardiomyopathy 11/08-11/03/2006 EGD HH No active bleeding 02/19/2006 Rotator cuff repair 1994 and 1995 L knee surgery 1996 CT Head   No acute abnmlty  10/03/2006 R heart Cath nml (Dr Bensihmon) 11/05/2006 [facility]OS[facility] Acute on Chronis CHF IIIB Nonisch Cardiomyop EF 20-25% Mod-Sev MR 8/14-8/23/08 Colonoscopy 2 Polyps Divertics Int Hemms (Dr Marina Goodell) 06/19/08    Family  History: Reviewed history from 07/31/2009 and no changes required. Father dec 77 CHF (Smoker) Mother dec 81 CHF Brother A 72 Allyne Gee)  Lung Ca Brother A 67 (Burnie) Liver ETOH COPD (disability) Brother A 59 (Doug) Nerves (disability) COPD Brother A 43 Peyton Najjar) Sister A 71 HTN Sister A 66 HTN Obesity  Social History: Reviewed history from 07/10/2008 and no changes required. Occupation: [employer] Retired Married lives w/ wife 1 adopted Current smoker.  Smoker since age 24.  Quit for 2 years and started back in Dec 2009.  Smokes 1 ppd. Alcohol use-yes Drug use-no  Review of Systems General:  Complains of fatigue; denies fever. CV:  Complains of chest pain or discomfort; HAd not had chest pain until yedsterday had momentary sharp pain..he feels it is associated from scar from defibrillator After chest pain occured..he mowed 8 acres, riding lawnmower, but still exerts himself.Marland Kitchen Resp:  Denies sputum productive and wheezing. GI:  Denies abdominal pain. GU:  Denies dysuria.  Physical Exam  General:  elderly male in NAD Mouth:  Oral mucosa and oropharynx without lesions or exudates. MMM, dentures Neck:  no cervical or  supraclavicular lymphadenopathy  no carotid bruit or thyromegaly  Lungs:  slightly diminished lung sounds in the lower bases,no wheezes, prolonged I:E ratio Heart:  Normal rate and regular rhythm. S1 and S2 normal without gallop, click, rub or other extra sounds. II-III/VI sys murmur precordially, heard best at upper right  sternal border. Abdomen:  Bowel sounds positive,abdomen soft and non-tender without masses, organomegaly or hernias noted. Msk:  no vertebral ttp  strenght 5/5  lower extrremeties 4/5 in upper extremeties Unable to  easily raise left are above head. Neg impingement sign, neg Neer's, neg drop arm test.  Pulses:  R and L posterior tibial pulses are full and equal bilaterally  Extremities:  no edema Neurologic:  alert & oriented X3.   Skin:  Intact without suspicious lesions or rashes Psych:  Cognition and judgment appear intact. Alert and cooperative with normal attention span and concentration. No apparent delusions, illusions, hallucinations  Diabetes Management Exam:    Foot Exam (with socks and/or shoes not present):       Sensory-Pinprick/Light touch:          Left medial foot (L-4): normal          Left dorsal foot (L-5): normal          Left lateral foot (S-1): normal          Right medial foot (L-4): normal          Right dorsal foot (L-5): normal          Right lateral foot (S-1): normal       Sensory-Monofilament:          Left foot: normal          Right foot: normal       Inspection:          Left foot: normal          Right foot: normal       Nails:          Left foot: normal          Right foot: normal   Impression & Recommendations:  Problem # 1:  FATIGUE / MALAISE (ICD-780.79) Work up negative with labs.  Consider PMR..will actually be on steroid as below..if weakness improved consider this as a diagnosis.  Problem # 2:  NECK  WITH LEFT ARM RADICULOPATHY (ICD-723.1) Neg  X-ray..treat with prednsione taper.  If not improving ..will refer to  PM and R for possible injections.  His updated medication list for this problem includes:    Adprin B 325 Mg Tabs (Aspirin buf(cacarb-mgcarb-mgo)) .Marland Kitchen... 1 daily by mouth  Problem # 3:  HYPERTENSION (ICD-401.9)  Well controlled. Continue current medication.  His updated medication list for this problem includes:    Furosemide 40 Mg Tabs (Furosemide) .Marland Kitchen... Take one tablet by mouth daily.    Coreg 12.5 Mg Tabs (Carvedilol) .Marland Kitchen... 1 by mouth two times a day    Norvasc 2.5 Mg Tabs (Amlodipine besylate) .Marland Kitchen... 1 by mouth daily    Eplerenone 25 Mg Tabs (Eplerenone) .Marland Kitchen... 1 by mouth daily    Lisinopril 20 Mg Tabs (Lisinopril) .Marland Kitchen... 1/2  tablet two times a day  BP today: 90/60 Prior BP: 100/62 (12/27/2009)  Prior 10 Yr Risk Heart Disease: 27 % (07/04/2008)  Labs Reviewed: K+: 4.1 (01/23/2010) Creat: : 0.9 (01/23/2010)   Chol: 99 (01/23/2010)   HDL: 27.00 (01/23/2010)   LDL: 47 (01/23/2010)   TG: 124.0 (01/23/2010)  Problem # 4:  HYPERLIPIDEMIA (ICD-272.4) Well controlled. Continue current medication.  His updated medication list for this problem includes:    Lipitor 20 Mg Tabs (Atorvastatin calcium) .Marland Kitchen... 1 daily by mouth at bedtime  Labs Reviewed: SGOT: 29 (01/23/2010)   SGPT: 35 (01/23/2010)  Prior 10 Yr Risk Heart Disease: 27 % (07/04/2008)   HDL:27.00 (01/23/2010), 26.80 (07/26/2009)  LDL:47 (01/23/2010), 36 (07/26/2009)  Chol:99 (01/23/2010), 81 (07/26/2009)  Trig:124.0 (01/23/2010), 92.0 (07/26/2009)  Problem # 5:  DIABETES MELLITUS, TYPE II (ICD-250.00) Well controlled. Continue current medication.  His updated medication list for this problem includes:    Amaryl 4 Mg Tabs (Glimepiride) ..... One tab by mouth daily    Adprin B 325 Mg Tabs (Aspirin buf(cacarb-mgcarb-mgo)) .Marland Kitchen... 1 daily by mouth    Lisinopril 20 Mg Tabs (Lisinopril) .Marland Kitchen... 1/2  tablet two times a day  Problem # 6:  COPD (ICD-496) Stable.  His updated medication list for this problem includes:    Spiriva  Handihaler 18 Mcg Caps (Tiotropium bromide monohydrate) ..... One inhale every morning  Complete Medication List: 1)  Spiriva Handihaler 18 Mcg Caps (Tiotropium bromide monohydrate) .... One inhale every morning 2)  Flomax 0.4 Mg Cp24 (Tamsulosin hcl) .Marland Kitchen.. 1 daily by mouth 3)  Protonix 40 Mg Tbec (Pantoprazole sodium) .... Take 1 tablet by mouth once a day 4)  Furosemide 40 Mg Tabs (Furosemide) .... Take one tablet by mouth daily. 5)  Coreg 12.5 Mg Tabs (Carvedilol) .Marland Kitchen.. 1 by mouth two times a day 6)  Iron 325 (65 Fe) Mg Tabs (Ferrous sulfate) .... 2 in the morning and 2 in the evening 7)  Amaryl 4 Mg Tabs (Glimepiride) .... One tab by mouth daily 8)  Accu-chek Aviva Strp (Glucose blood) .... Use 1 daily and as needed for diabeties 9)  Adprin B 325 Mg Tabs (Aspirin buf(cacarb-mgcarb-mgo)) .Marland Kitchen.. 1 daily by mouth 10)  Colchicine 0.6 Mg Tabs (Colchicine) .Marland Kitchen.. 1 twice a day as needed for gout pain 11)  Norvasc 2.5 Mg Tabs (Amlodipine besylate) .Marland Kitchen.. 1 by mouth daily 12)  Eplerenone 25 Mg Tabs (Eplerenone) .Marland Kitchen.. 1 by mouth daily 13)  Lisinopril 20 Mg Tabs (Lisinopril) .... 1/2  tablet two times a day 14)  Lipitor 20 Mg Tabs (Atorvastatin calcium) .Marland Kitchen.. 1 daily by mouth at bedtime 15)  Klor-con M20 20 Meq Cr-tabs (Potassium chloride crys cr) .Marland Kitchen.. 1 tablet twice a day by mouth  16)  Avodart 0.5 Mg Caps (Dutasteride) .... One by mouth daily 17)  Vitamin B-12 2500 Mcg Subl (Cyanocobalamin) .... Take one by mouth daily 18)  Prednisone 20 Mg Tabs (Prednisone) .... 3 tabs by mouth daily x 3 days, then 2 tabs by mouth daily x 2 days then 1 tab by mouth daily x 2 days  Other Orders: Influenza Vaccine MCR (66440) Pneumococcal Vaccine (34742) Admin 1st Vaccine (59563)  Patient Instructions: 1)  Heat on neck. 2)   Start prednisone course. 3)   Follow up appt in 2 weeks (30 min). Prescriptions: PREDNISONE 20 MG TABS (PREDNISONE) 3 tabs by mouth daily x 3 days, then 2 tabs by mouth daily x 2 days then 1 tab  by mouth daily x 2 days  #15 x 0   Entered and Authorized by:   Kerby Nora MD   Signed by:   Kerby Nora MD on 01/29/2010   Method used:   Electronically to        [facility] [id number] (retail)       [address]       [place], [place]  [address]       Ph: [phone] or [phone]       Fax: [phone]   RxID:   [id number]    Orders Added: 1)  Influenza Vaccine MCR [00025] 2)  Est. Patient Level IV [61607] 3)  Pneumococcal Vaccine [90732] 4)  Admin 1st Vaccine [37106]   Immunizations Administered:  Influenza Vaccine # 1:    Vaccine Type: Fluvax MCR    Site: left deltoid    Mfr: GlaxoSmithKline    Dose: 0.5 ml    Route: IM    Given by: Benny Lennert CMA (AAMA)    Exp. Date: 10/11/2010    Lot #: [id number]    VIS given: 11/05/09 version given January 29, 2010.  Pneumonia Vaccine:    Vaccine Type: Pneumovax (Medicare)    Site: right deltoid    Mfr: Merck    Dose: 0.5 ml    Route: IM    Given by: Benny Lennert CMA (AAMA)    Exp. Date: 06/26/2011    Lot #: [id number]    VIS given: 03/18/09 version given January 29, 2010.  Flu Vaccine Consent Questions:    Do you have a history of severe allergic reactions to this vaccine? no    Any prior history of allergic reactions to egg and/or gelatin? no    Do you have a sensitivity to the preservative Thimersol? no    Do you have a past history of Guillan-Barre Syndrome? no    Do you currently have an acute febrile illness? no    Have you ever had a severe reaction to latex? no    Vaccine information given and explained to patient? yes   Immunizations Administered:  Influenza Vaccine # 1:    Vaccine Type: Fluvax MCR    Site: left deltoid    Mfr: GlaxoSmithKline    Dose: 0.5 ml    Route: IM    Given by: Benny Lennert CMA (AAMA)    Exp. Date: 10/11/2010    Lot #: [id number]    VIS given: 11/05/09 version given January 29, 2010.  Pneumonia Vaccine:    Vaccine Type: Pneumovax (Medicare)    Site:  right deltoid    Mfr: Merck    Dose: 0.5 ml    Route: IM    Given by: Benny Lennert CMA (AAMA)    Exp. Date: 06/26/2011  Lot #: [id number]    VIS given: 03/18/09 version given January 29, 2010.  Current Allergies (reviewed today): No known allergies  Last Flu Vaccine:  Fluvax 3+ (01/31/2009 8:31:09 AM) Flu Vaccine Result Date:  01/29/2010 Flu Vaccine Result:  given Flu Vaccine Next Due:  1 yr Pneumovax Result Date:  01/29/2010 Pneumovax Result:  given Pneumovax Next Due:  5 yr Flex Sig Next Due:  Not Indicated Last Hemoccult Result: Negative (05/15/2003 9:53:56 PM) Hemoccult Next Due:  Not Indicated Last PSA Result:  4.87 free psa 16% (11/11/2005 9:57:56 PM) PSA Next Due:  Not Indicated

## 2010-05-15 NOTE — Assessment & Plan Note (Signed)
Summary: CPX/REFILL GOUT MEDICATION/CLE   Vital Signs:  Patient profile:   77 year old male Weight:      187.25 pounds Temp:     98.0 degrees F oral Pulse rate:   76 / minute Pulse rhythm:   regular BP sitting:   104 / 60  (left arm) Cuff size:   regular  Vitals Entered By: Sydell Axon LPN (July 31, 2009 9:28 AM) CC: 30 Minute checkup, had a colonoscopy 03/10 by Dr. Marina Goodell   History of Present Illness: Pt here for followup. Since the last pacemaker, he has some stinging occas...sometimes at the incision site and sometimes over the rib below. He has seen Dr Graciela Husbands a few months ago and won't see him for a while now. He has developed left arm tingling and numbness with moving his neck...his brother has had surgery for that in the past. Brother's doctor was Dr Channing Mutters.  He also has a lesion on the top of his bald head.  Preventive Screening-Counseling & Management  Alcohol-Tobacco     Alcohol drinks/day: <1     Alcohol type: beer     Smoking Status: current     Packs/Day: 1.0     Year Quit: 2007     Pack years: 100+     Passive Smoke Exposure: no  Caffeine-Diet-Exercise     Caffeine use/day: 2     Does Patient Exercise: no     Type of exercise: does lots of yard work  Problems Prior to Update: 1)  Implantable Defibrillator Crt - Mdt  (ICD-V45.02) 2)  Atrial Fibrillation S/p Av Ablation  (ICD-427.31) 3)  Tobacco Abuse  (ICD-305.1) 4)  Adenocarcinoma, Prostate  (ICD-185) 5)  Shortness of Breath  (ICD-786.05) 6)  Ventricular Fibrillation  (ICD-427.41) 7)  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)  (ICD-755.66) 8)  Back Pain With Radiculopathy/r Hip Pain  (ICD-729.2) 9)  Hand Pain, Right  (ICD-729.5) 10)  Mitral Regurgitation/ Mod-severe  (ICD-396.3) 11)  Aftercare, Long-term Use, Medications Nec  (ICD-V58.69) 12)  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) 13)  Cardiomyopathy, Primary, Resolved  (ICD-425.4) 14)  Pneumonia, Hx of  (ICD-V12.60) 15)  Pulmonary Nodule, Right Lower Lobe   (ICD-518.89) 16)  Atrophy, 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 One Inhale Every Morning 2)  Flomax 0.4 Mg Cp24 (Tamsulosin Hcl) .Marland Kitchen.. 1 Daily By Mouth 3)  Protonix 40 Mg Tbec (Pantoprazole Sodium) .... Take 1 Tablet By Mouth Once A Day 4)  Furosemide 40 Mg Tabs (Furosemide) .... Take One Tablet By Mouth Daily. 5)  Coreg 12.5 Mg Tabs (Carvedilol) .Marland Kitchen.. 1 By Mouth Two Times A Day 6)  Iron 325 (65 Fe) Mg Tabs (Ferrous Sulfate) .... 2 in The Morning and 2 in The Evening 7)  Amaryl 4 Mg Tabs (Glimepiride) .... One Tab By Mouth Qd 8)  Accu-Chek Aviva   Strp (Glucose Blood) .... Use 1 Daily and As Needed For Diabeties 9)  Adprin B 325 Mg Tabs (Aspirin Buf(Cacarb-Mgcarb-Mgo)) .Marland Kitchen.. 1 Daily By Mouth 10)  Colchicine 0.6 Mg Tabs (Colchicine) .Marland Kitchen.. 1 Twice A Day As Needed For Gout Pain 11)  Norvasc 2.5 Mg Tabs (Amlodipine Besylate) .Marland Kitchen.. 1 By Mouth Daily 12)  Eplerenone 25 Mg Tabs (Eplerenone) .Marland Kitchen.. 1 By Mouth Daily 13)  Lisinopril 20 Mg Tabs (Lisinopril) .... 1/2  Tablet Two Times A Day 14)  Lipitor 20 Mg Tabs (Atorvastatin Calcium) .Marland Kitchen.. 1 Daily By Mouth At Bedtime 15)  Klor-Con M20 20 Meq Cr-Tabs (Potassium Chloride Crys Cr) .Marland Kitchen.. 1 Tablet Twice A Day By Mouth 16)  Avodart 0.5 Mg Caps (Dutasteride) .... One By Mouth Daily 17)  Vitamin B-12 2500 Mcg Subl (Cyanocobalamin) .... Take One By Mouth Daily  Allergies: No Known Drug Allergies  Past History:  Past Medical History: Last updated: 05/30/2009  1. CHF secondary to nonischemic  cardiomyopathy      a.    ECHO 10/07, EF 20-25%,  moderate to severe MR      b.    ECHO 1/10, EF 55-60%, mild MR      c.    ECHO  2/11 55-65%       c.    s/p Medtronic BiV ICD with biventricular function now turned        off due to diahragmatic stimulatiob      d.     Right heart catheterization November 05, 2006.  Right atrial        pressure mean of 12, RV pressure 36/8, PA pressure 39/16 with a        mean of 28, wedge pressure was 20.  Fick cardiac output was 5        liters per minute, cardiac index was 2.4.   2. Cardiac cath 01/2006, showed mild nonobstructive CAD  3. COPD GOLD II    - Spirometry July 10, 2008 > FEV1 1.46 56% predicted, ratio of 66%  4. Atrial fibrillation status post AV node ablation       a. not on coumadin due to GIB  5. Iron-deficiency anemia with previous severe GI bleed   6. Hypertension.   7. Hyperlipidemia.   8. Left bundle branch block  9. Diabetes 10.Benign prostatic hypertrophy 09/11/1997 11.Pneumonia, hx of 2-3 times as child 12.Chronic dyspnea 13. Obesity 14. Aborted VF arrest (shocked by ICD) November 2009  Past Surgical History: Last updated: 06/20/2008 Lung-lobectomy RLL 01/21/1998 EGD prepyloric ulcer, esoph ring, duod avm 09/1997 Colonoscopy polypectomy, divertics, int hemms 11/1997 [facility] GI bleed 02/07-02/12/2002 EGD poss Barrett's 05/22/2002 Colonoscopy polyp,multip(neg) divertics,int hemm 06/26/2002 Colonoscopy polyps, divetics, int hemms 07/10/2004 [facility] SOB, AFIB, CHF 10/11-10/18/2007 Cath min obstruct dz severe LV dysftn EF 25% [facility] acute blood loss, anemia, sys HF, isch cardiomyopathy 11/08-11/03/2006 EGD HH No active bleeding 02/19/2006 Rotator cuff repair 1994 and 1995 L knee surgery 1996 CT Head   No acute abnmlty  10/03/2006 R heart Cath nml (Dr Bensihmon) 11/05/2006 [facility]OS[facility] Acute on Chronis CHF IIIB Nonisch Cardiomyop EF 20-25% Mod-Sev MR 8/14-8/23/08 Colonoscopy 2 Polyps Divertics Int Hemms (Dr Marina Goodell) 06/19/08    Family History: Last updated: 07/31/2009 Father dec 77 CHF  (Smoker) Mother dec 81 CHF Brother A 74 Allyne Gee)  Lung Ca Brother A 67 (Burnie) Liver ETOH COPD (disability) Brother A 59 (Doug) Nerves (disability) COPD Brother A 52 Peyton Najjar) Sister A 30 HTN Sister A 66 HTN Obesity  Social History: Last updated: 07/10/2008 Occupation: [employer] Retired Married lives w/ wife 1 adopted Current smoker.  Smoker since age 47.  Quit for 2 years and started back in Dec 2009.  Smokes 1 ppd. Alcohol use-yes Drug use-no  Risk Factors: Alcohol Use: <1 (07/31/2009) Caffeine Use:  2 (07/31/2009) Exercise: no (07/31/2009)  Risk Factors: Smoking Status: current (07/31/2009) Packs/Day: 1.0 (07/31/2009) Passive Smoke Exposure: no (07/31/2009)  Family History: Father dec 77 CHF (Smoker) Mother dec 81 CHF Brother A 74 Market researcher)  Lung Ca Brother A 67 (Burnie) Liver ETOH COPD (disability) Brother A 59 (Doug) Nerves (disability) COPD Brother A 43 Peyton Najjar) Sister A 2 HTN Sister A 66 HTN Obesity  Social History: Caffeine use/day:  2  Review of Systems General:  Complains of fatigue and weakness; denies chills, fever, sweats, and weight loss; occas in AM. Eyes:  Denies blurring, discharge, and eye pain. ENT:  Complains of decreased hearing; denies earache and ringing in ears; chrnic. CV:  Complains of palpitations; denies chest pain or discomfort, fatigue, shortness of breath with exertion, swelling of feet, and swelling of hands; rare with caffeine. Resp:  Complains of shortness of breath; denies cough and wheezing; COPD with pollen allergies. GI:  Complains of constipation; denies abdominal pain, bloody stools, change in bowel habits, dark tarry stools, diarrhea, indigestion, loss of appetite, nausea, vomiting, vomiting blood, and yellowish skin color. GU:  Complains of nocturia; denies discharge, dysuria, and urinary frequency; slow but better with Avodart.. MS:  Complains of joint pain and low back pain; throughout.. Derm:  Denies dryness, itching,  and rash; lesion on top of head.. Neuro:  Denies brief paralysis, difficulty with concentration, disturbances in coordination, falling down, headaches, inability to speak, memory loss, numbness, poor balance, seizures, sensation of room spinning, tingling, tremors, visual disturbances, and weakness.  Physical Exam  General:  Well-developed,well-nourished,in no acute distress; alert,appropriate and cooperative throughout examination, comfortable.. Head:  Normocephalic and atraumatic without obvious abnormalities. No apparent alopecia but male pattern balding. Eyes:  Conjunctiva clear bilaterally.  Ears:  External ear exam shows no significant lesions or deformities.  Otoscopic examination reveals clear canals, tympanic membranes are intact bilaterally without bulging, retraction, inflammation or discharge. Hearing is grossly normal bilaterally. Mild cerumen bilat. Nose:  External nasal examination shows no deformity or inflammation. Nasal mucosa are pink and moist without lesions or exudates. Mouth:  Oral mucosa and oropharynx without lesions or exudates.  Neck:  No deformities, masses, or tenderness noted. No thyromegaly. Mild crepitus with lateral flexion. Chest Wall:  No deformities, masses, tenderness or gynecomastia noted. Breasts:  No masses or gynecomastia noted Lungs:  slightly deminished lung sounds in the lower bases,no wheezes, prolonged I:E ratio Heart:  Normal rate and regular rhythm. S1 and S2 normal without gallop, click, rub or other extra sounds. II-III/VI sys murmur precordially, heard best at upper right  sternal border. Abdomen:  Bowel sounds positive,abdomen soft and non-tender without masses, organomegaly or hernias noted. Now minimally protuberant. Rectal:  No external abnormalities noted. Normal sphincter tone. No rectal masses or tenderness. Small anal tag, G neg stool. Genitalia:  Testes bilaterally descended without nodularity, L larger than R (longstanding due to mumps),  no tenderness or masses. No scrotal masses or lesions. No penis lesions or urethral discharge. Prostate:  Prostate gland firm and smooth, no enlargement, nodularity, tenderness, mass, asymmetry or induration. 20 gms. Msk:  No deformity or scoliosis noted of thoracic or lumbar spine.  Nontender to palpation. Pulses:  R and L carotid,radial,femoral,dorsalis pedis and posterior tibial pulses are full and equal bilaterally in UEs, slightly diminished in the LEs. Extremities:  No clubbing, cyanosis, edema, or deformity noted with normal full range of motion of all joints.   Neurologic:  alert & oriented X3.  gait slow first few steps Skin:  Intact  without suspicious lesions or rashes, forearms and head with significant sun damage. Exophytic scabbed lesion on the extreme vertex of the scalp. Cervical Nodes:  No lymphadenopathy noted Inguinal Nodes:  No significant adenopathy Psych:  Cognition and judgment appear intact. Alert and cooperative with normal attention span and concentration. No apparent delusions, illusions, hallucinations  Diabetes Management Exam:    Foot Exam (with socks and/or shoes not present):       Sensory-Pinprick/Light touch:          Left medial foot (L-4): diminished          Left dorsal foot (L-5): diminished          Left lateral foot (S-1): diminished          Right medial foot (L-4): diminished          Right dorsal foot (L-5): diminished          Right lateral foot (S-1): diminished       Sensory-Monofilament:          Left foot: diminished          Right foot: diminished       Inspection:          Left foot: abnormal             Comments: dry flaky skin with callouses around the heel.          Right foot: abnormal             Comments: dry flaky skin with callouses around the heel.       Nails:          Left foot: thickened          Right foot: thickened   Impression & Recommendations:  Problem # 1:  NECK  WITH LEFT ARM RADICULOPATHY (ICD-723.1) Assessment  New Regfer for xrays and tial of steroids. His updated medication list for this problem includes:    Adprin B 325 Mg Tabs (Aspirin buf(cacarb-mgcarb-mgo)) .Marland Kitchen... 1 daily by mouth  Orders: Radiology Referral (Radiology)  Problem # 2:  TOBACCO ABUSE (ICD-305.1) Assessment: Unchanged Encouraged to quit. Has successfully done so in the past.  Problem # 3:  ADENOCARCINOMA, PROSTATE (ICD-185) Assessment: Improved Stable. PSA has decreased on Avodart. Seeing [facility] regularly.  Problem # 4:  SHORTNESS OF BREATH (ICD-786.05) Assessment: Improved Stable, no recent problems.  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  Problem # 6:  HYPERTENSION (ICD-401.9) Assessment: Unchanged Stable. His updated medication list for this problem includes:    Furosemide 40 Mg Tabs (Furosemide) .Marland Kitchen... Take one tablet by mouth daily.    Coreg 12.5 Mg Tabs (Carvedilol) .Marland Kitchen... 1 by mouth two times a day    Norvasc 2.5 Mg Tabs (Amlodipine besylate) .Marland Kitchen... 1 by mouth daily    Eplerenone 25 Mg Tabs (Eplerenone) .Marland Kitchen... 1 by mouth daily    Lisinopril 20 Mg Tabs (Lisinopril) .Marland Kitchen... 1/2  tablet two times a day  BP today: 104/60 Prior BP: 96/58 (06/03/2009)  Prior 10 Yr Risk Heart Disease: 27 % (07/04/2008)  Labs Reviewed: K+: 4.3 (07/26/2009) Creat: : 1.1 (07/26/2009)   Chol: 81 (07/26/2009)   HDL: 26.80 (07/26/2009)   LDL: 36 (07/26/2009)   TG: 92.0 (07/26/2009)  Problem # 7:  HYPERLIPIDEMIA (ICD-272.4) Assessment: Improved Adequately controlled. His updated medication list for this problem includes:    Lipitor 20 Mg Tabs (Atorvastatin calcium) .Marland Kitchen... 1 daily by mouth at bedtime  Labs Reviewed: SGOT: 34 (  07/26/2009)   SGPT: 47 (07/26/2009)  Prior 10 Yr Risk Heart Disease: 27 % (07/04/2008)   HDL:26.80 (07/26/2009), 24.60 (07/12/2008)  LDL:36 (07/26/2009), 45 (07/12/2008)  Chol:81 (07/26/2009), 87 (07/12/2008)  Trig:92.0 (07/26/2009), 87.0  (07/12/2008)  Problem # 8:  DIABETES MELLITUS, TYPE II (ICD-250.00) Assessment: Improved Great control.. Cont curr meds , diet and routine. His updated medication list for this problem includes:    Amaryl 4 Mg Tabs (Glimepiride) ..... One tab by mouth daily    Adprin B 325 Mg Tabs (Aspirin buf(cacarb-mgcarb-mgo)) .Marland Kitchen... 1 daily by mouth    Lisinopril 20 Mg Tabs (Lisinopril) .Marland Kitchen... 1/2  tablet two times a day  Labs Reviewed: Creat: 1.1 (07/26/2009)    Reviewed HgBA1c results: 5.8 (07/26/2009)  6.0 (01/28/2009)  Problem # 9:  ANEMIA-IRON DEFICIENCY (ICD-280.9) Assessment: Improved Stable, cont iron. His updated medication list for this problem includes:    Iron 325 (65 Fe) Mg Tabs (Ferrous sulfate) .Marland Kitchen... 2 in the morning and 2 in the evening    Vitamin B-12 2500 Mcg Subl (Cyanocobalamin) .Marland Kitchen... Take one by mouth daily  Hgb: 12.2 (07/26/2009)   Hct: 35.5 (07/26/2009)   Platelets: 172.0 (07/26/2009) RBC: 3.87 (07/26/2009)   RDW: 16.0 (07/26/2009)   WBC: 8.5 (07/26/2009) MCV: 91.7 (07/26/2009)   MCHC: 34.2 (07/26/2009) Iron: 46 (07/26/2009)   % Sat: 13.8 (07/12/2008) B12: >1500 pg/mL (07/26/2009)   Folate: 13.5 (08/18/2006)   TSH: 1.58 (07/26/2009)  Complete Medication List: 1)  Spiriva Handihaler 18 Mcg Caps (Tiotropium bromide monohydrate) .... One inhale every morning 2)  Flomax 0.4 Mg Cp24 (Tamsulosin hcl) .Marland Kitchen.. 1 daily by mouth 3)  Protonix 40 Mg Tbec (Pantoprazole sodium) .... Take 1 tablet by mouth once a day 4)  Furosemide 40 Mg Tabs (Furosemide) .... Take one tablet by mouth daily. 5)  Coreg 12.5 Mg Tabs (Carvedilol) .Marland Kitchen.. 1 by mouth two times a day 6)  Iron 325 (65 Fe) Mg Tabs (Ferrous sulfate) .... 2 in the morning and 2 in the evening 7)  Amaryl 4 Mg Tabs (Glimepiride) .... One tab by mouth daily 8)  Accu-chek Aviva Strp (Glucose blood) .... Use 1 daily and as needed for diabeties 9)  Adprin B 325 Mg Tabs (Aspirin buf(cacarb-mgcarb-mgo)) .Marland Kitchen.. 1 daily by mouth 10)  Colchicine  0.6 Mg Tabs (Colchicine) .Marland Kitchen.. 1 twice a day as needed for gout pain 11)  Norvasc 2.5 Mg Tabs (Amlodipine besylate) .Marland Kitchen.. 1 by mouth daily 12)  Eplerenone 25 Mg Tabs (Eplerenone) .Marland Kitchen.. 1 by mouth daily 13)  Lisinopril 20 Mg Tabs (Lisinopril) .... 1/2  tablet two times a day 14)  Lipitor 20 Mg Tabs (Atorvastatin calcium) .Marland Kitchen.. 1 daily by mouth at bedtime 15)  Klor-con M20 20 Meq Cr-tabs (Potassium chloride crys cr) .Marland Kitchen.. 1 tablet twice a day by mouth 16)  Avodart 0.5 Mg Caps (Dutasteride) .... One by mouth daily 17)  Vitamin B-12 2500 Mcg Subl (Cyanocobalamin) .... Take one by mouth daily 18)  Prednisone 50 Mg Tabs (Prednisone) .... One tab by mouth in am  Patient Instructions: 1)  Refer for neck xrays. Prescriptions: PREDNISONE 50 MG TABS (PREDNISONE) one tab by mouth in AM  #7 x 0   Entered and Authorized by:   [name] MD   Signed by:   [name] MD on 07/31/2009   Method used:   Electronically to        [facility]  [address] [phone]* (retail)       [address]  [place], [place]  [address]       Ph: [phone] or [phone]       Fax: [phone]   RxID:   [id number] AVODART 0.5 MG CAPS (DUTASTERIDE) one by mouth daily  #90 x 3   Entered by:   Sydell Axon LPN   Authorized by:   [name] MD   Signed by:   Sydell Axon LPN on 51/88/4166   Method used:   Electronically to        [facility]* (mail-order)             ,          Ph: [phone]       Fax: [phone]   RxID:   [id number] COLCHICINE 0.6 MG TABS (COLCHICINE) 1 twice a day as needed for gout pain  #180 x 3   Entered by:   Sydell Axon LPN   Authorized by:   [name] MD   Signed by:   Sydell Axon LPN on 17/61/6073   Method used:   Electronically to        [facility]* (mail-order)             ,          Ph: [phone]       Fax: [phone]   RxID:   [id number] AMARYL 4 MG TABS (GLIMEPIRIDE) ONE TAB by mouth daily  #90 x 3   Entered by:    Sydell Axon LPN   Authorized by:   [name] MD   Signed by:   Sydell Axon LPN on 38/01/1750   Method used:   Electronically to        [facility]* (mail-order)             ,          Ph: [phone]       Fax: [phone]   RxID:   [id number] FUROSEMIDE 40 MG TABS (FUROSEMIDE) Take one tablet by mouth daily.  #90 x 3   Entered by:   Sydell Axon LPN   Authorized by:   [name] MD   Signed by:   Sydell Axon LPN on 67/03/4579   Method used:   Electronically to        [facility]* (mail-order)             ,          Ph: [phone]       Fax: [phone]   RxID:   [id number]   Current Allergies (reviewed today): No known allergies

## 2010-05-15 NOTE — Assessment & Plan Note (Signed)
Summary: 2 WEEK FOLLOW UP/RB   Vital Signs:  Patient profile:   77 year old male Height:      64.5 inches Weight:      189.75 pounds BMI:     32.18 Temp:     97.8 degrees F oral Pulse rate:   72 / minute Pulse rhythm:   regular BP sitting:   110 / 70  (left arm) Cuff size:   regular  Vitals Entered By: Benny Lennert CMA Duncan Dull) (May 07, 2010 10:49 AM)  History of Present Illness: Chief complaint 2 week follow up fracture  Left foot fracture follow-up  L cuboid fracture, R toe fracture, and small L talus fracture, with an additional avulsion fracture of the L lateral malleolus / distal fibula.  still with poor compliance, but he really is not having any pain except at his lateral malleolus and mild at that area, good ROM.   Stable and in no significant pain. Walks with a cane.    PRIOR OV: The patient has poor sensation due to neurological trauma and spinal compromise with neuropathy.  Has been non-compliant, stopped wearing his boot earlier, b/c slipped once while wearing it and almost fell.  He has been weightbearing. (asked to be NWB)  Overall, though, he is actually doing well, tolerating his situation well with minimal pain and swelling.  Allergies (verified): No Known Drug Allergies  Past History:  Past medical, surgical, family and social histories (including risk factors) reviewed, and no changes noted (except as noted below).  Past Medical History: Reviewed history from 05/30/2009 and no changes required.  1. CHF secondary to nonischemic  cardiomyopathy      a.    ECHO 10/07, EF 20-25%, moderate to severe MR      b.    ECHO 1/10, EF 55-60%, mild MR      c.    ECHO  2/11 55-65%       c.    s/p Medtronic BiV ICD with biventricular function now turned        off due to diahragmatic stimulatiob      d.     Right heart catheterization November 05, 2006.  Right atrial        pressure mean of 12, RV pressure 36/8, PA pressure 39/16 with a        mean of 28,  wedge pressure was 20.  Fick cardiac output was 5        liters per minute, cardiac index was 2.4.   2. Cardiac cath 01/2006, showed mild nonobstructive CAD  3. COPD GOLD II    - Spirometry July 10, 2008 > FEV1 1.46 56% predicted, ratio of 66%  4. Atrial fibrillation status post AV node ablation       a. not on coumadin due to GIB  5. Iron-deficiency anemia with previous severe GI bleed   6. Hypertension.   7. Hyperlipidemia.   8. Left bundle branch block  9. Diabetes 10.Benign prostatic hypertrophy 09/11/1997 11.Pneumonia, hx of 2-3 times as child 12.Chronic dyspnea 13. Obesity 14. Aborted VF arrest (shocked by ICD) November 2009  Past Surgical History: Reviewed history from 06/20/2008 and no changes required. Lung-lobectomy RLL 01/21/1998 EGD prepyloric ulcer, esoph ring, duod avm 09/1997 Colonoscopy polypectomy, divertics, int hemms 11/1997 [facility] GI bleed 02/07-02/12/2002 EGD poss Barrett's 05/22/2002 Colonoscopy polyp,multip(neg) divertics,int hemm 06/26/2002 Colonoscopy polyps, divetics, int hemms 07/10/2004 [facility] SOB, AFIB, CHF 10/11-10/18/2007 Cath min obstruct dz severe LV dysftn EF 25% [facility] acute blood loss,  anemia, sys HF, isch cardiomyopathy 11/08-11/03/2006 EGD HH No active bleeding 02/19/2006 Rotator cuff repair 1994 and 1995 L knee surgery 1996 CT Head   No acute abnmlty  10/03/2006 R heart Cath nml (Dr Bensihmon) 11/05/2006 HOSP Acute on Chronis CHF IIIB Nonisch Cardiomyop EF 20-25% Mod-Sev MR 8/14-8/23/08 Colonoscopy 2 Polyps Divertics Int Hemms (Dr Marina Goodell) 06/19/08    Family History: Reviewed history from 07/31/2009 and no changes required. Father dec 77 CHF (Smoker) Mother dec 81 CHF Brother A 27 Allyne Gee)  Lung Ca Brother A 67 (Burnie) Liver ETOH COPD (disability) Brother A 59 (Doug) Nerves (disability) COPD Brother A 51 Peyton Najjar) Sister A 101 HTN Sister A 66 HTN Obesity  Social History: Reviewed history from 07/10/2008 and no changes required. Occupation:  [employer] Retired Married lives w/ wife 1 adopted Current smoker.  Smoker since age 56.  Quit for 2 years and started back in Dec 2009.  Smokes 1 ppd. Alcohol use-yes Drug use-no  Review of Systems       REVIEW OF SYSTEMS  GEN: No systemic complaints, no fevers, chills, sweats, or other acute illnesses MSK: Detailed in the HPI GI: tolerating PO intake without difficulty Neuro: decreased sensation. Otherwise the pertinent positives of the ROS are noted above.    Physical Exam  Msk:  right foot:much improved around toes Nontender along all metatarsals, and all bones of the forefoot, midfoot, and ankle.  Left foot: Nontender with fibular squeeze. Nontender at the fibular proximal or tibia proximally.  Distal fibula is mildly  tender.  Medial malleolus is nontender.  Talus now NT  Midfoot is now NT. Nontender along all metatarsals including the fifth metatarsal head.  Nontender along all phalanges and with good phalanges motion.   Impression & Recommendations:  Problem # 1:  CLOSED FRACTURE OF LATERAL MALLEOLUS (ICD-824.2)  04/01/2010 DOI  Doing amazingly well. Did not follow recommendations for immobilization, which would be standard, but he has actually done well, and I do not think he will have any deficit. Proprioception is diminished on the L injured side. Was only compliant with basic aircast for a few days.  I suspect his nerve damage and decreased sensation allowed him to walk on his foot, thankfully the fracture fragments were small.  A UNIVERSAL FRACTURE CHARGE HAS BEEN APPLIED IN THE CARE OF THIS INJURY.  No co-pay should be applied in the future, and there is a 90 day follow-up period for subsequent care of this injury.   Orders: No Charge Patient Arrived (NCPA0) (NCPA0)  Problem # 2:  CLOSED FRACTURE OF CUBOID BONE (ICD-825.23)  Orders: No Charge Patient Arrived (NCPA0) (NCPA0)  Problem # 3:  OTHER CLOSED FRACTURE OF TARSAL&METATARSAL BONES  (ICD-825.29)  Orders: No Charge Patient Arrived (NCPA0) (NCPA0)  Problem # 4:  FRACTURE, TOE (ICD-826.0)  Orders: No Charge Patient Arrived (NCPA0) (NCPA0)  Complete Medication List: 1)  Spiriva Handihaler 18 Mcg Caps (Tiotropium bromide monohydrate) .... One inhale every morning 2)  Flomax 0.4 Mg Cp24 (Tamsulosin hcl) .Marland Kitchen.. 1 daily by mouth 3)  Protonix 40 Mg Tbec (Pantoprazole sodium) .... Take 1 tablet by mouth once a day 4)  Furosemide 40 Mg Tabs (Furosemide) .... Take one tablet by mouth daily. 5)  Coreg 12.5 Mg Tabs (Carvedilol) .... Take 1/2 tablet by mouth two times a day 6)  Iron 325 (65 Fe) Mg Tabs (Ferrous sulfate) .... 2 in the morning and 2 in the evening 7)  Amaryl 4 Mg Tabs (Glimepiride) .... One tab by mouth daily  8)  Accu-chek Aviva Strp (Glucose blood) .... Use 1 daily and as needed for diabeties 9)  Adprin B 325 Mg Tabs (Aspirin buf(cacarb-mgcarb-mgo)) .Marland Kitchen.. 1 daily by mouth 10)  Colcrys 0.6 Mg Tabs (Colchicine) .Marland Kitchen.. 1 tab by mouth two times a day as needed gout pain 11)  Eplerenone 25 Mg Tabs (Eplerenone) .... Take 1/2 by mouth daily 12)  Lisinopril 20 Mg Tabs (Lisinopril) .... 1/2  tablet once daily. 13)  Lipitor 20 Mg Tabs (Atorvastatin calcium) .Marland Kitchen.. 1 daily by mouth at bedtime 14)  Klor-con M20 20 Meq Cr-tabs (Potassium chloride crys cr) .Marland Kitchen.. 1 tablet twice a day by mouth 15)  Avodart 0.5 Mg Caps (Dutasteride) .... One by mouth daily 16)  Vitamin B-12 2500 Mcg Subl (Cyanocobalamin) .... Take one by mouth daily 17)  Vitamin D3 2000 Unit Caps (Cholecalciferol) .Marland Kitchen.. 1 by mouth daily 18)  Gabapentin 100 Mg Caps (Gabapentin) .Marland Kitchen.. 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 19)  Wheelchair, Standard Rolling  .... Length of need = 99 years (perm) dx codes: 825.23, 824.2, 826.0, v45.02   Orders Added: 1)  No Charge Patient Arrived (NCPA0) [NCPA0]    Current Allergies (reviewed today): No known allergies

## 2010-05-15 NOTE — Cardiovascular Report (Signed)
Summary: Office Visit Remote  Office Visit   Imported By: Roderic Ovens 08/21/2009 13:30:42  _____________________________________________________________________  External Attachment:    Type:   Image     Comment:   External Document

## 2010-05-15 NOTE — Letter (Signed)
Summary: Remote Device Check  [facility], Main Office  [address]   [place], [place] [address]   Phone: [phone]  Fax: [phone]     March 13, 2010 MRN: [id number]   [name] [address] [place], [place]  [address]   Dear [name],   Your remote transmission was recieved and reviewed by your physician.  All diagnostics were within normal limits for you.  _____Your next transmission is scheduled for:                       .  Please transmit at any time this day.  If you have a wireless device your transmission will be sent automatically.  __X____Your next office visit is scheduled for:  December 2011 with Dr. Graciela Husbands.                            Marland Kitchen Please call our office to schedule an appointment.    Sincerely,  Altha Harm, LPN

## 2010-05-15 NOTE — Assessment & Plan Note (Signed)
Summary: LOW BLOOD PRESSURE/CLE   Vital Signs:  Patient profile:   77 year old male Height:      64.5 inches Weight:      189.9 pounds BMI:     32.21 O2 Sat:      100 % on Room air Temp:     98.1 degrees F oral Pulse rate:   70 / minute Pulse rhythm:   regular BP sitting:   100 / 60  (left arm) Cuff size:   regular  Vitals Entered By: Benny Lennert CMA Duncan Dull) (March 28, 2010 2:57 PM)  O2 Flow:  Room air  History of Present Illness: Chief complaint low bp  Since last OV... he has had 2 neck surgeries.Marland Kitchen on C4... had second syurgery due to complication of temporary paralysis due to blood clot  in neck.  MD..Dr. Yetta Barre. Paralysis improved greatly.. no wable to walk, but still wobbly, some weakness but may not return to normal.  Wearing neck brace. Not taking any pain med or muscle relaxant currently in last 4-5 days.  Requested lumbar back brace for chronic lumbar pain and overall weakness.   Told by ortho he has evidence of osteoporosis.   DM..check CBG daily.   Low blood pressures at home... over last 2-3 months. 97/58- 100/70s No current lightheadedness.Marland KitchenMarland Kitchenfew weeks ago some lightheadedness. 5 lb weight loss in last month.      Preventive Screening-Counseling & Management  Alcohol-Tobacco     Smoking Status: current     Smoking Cessation Counseling: YES     Packs/Day: 1.0  Problems Prior to Update: 1)  Peripheral Neuropathy  (ICD-356.9) 2)  Unspecified Vitamin D Deficiency  (ICD-268.9) 3)  Fatigue / Malaise  (ICD-780.79) 4)  Neck With Left Arm Radiculopathy  (ICD-723.1) 5)  Implantable Defibrillator Crt - Mdt  (ICD-V45.02) 6)  Atrial Fibrillation S/p Av Ablation  (ICD-427.31) 7)  Tobacco Abuse  (ICD-305.1) 8)  Adenocarcinoma, Prostate  (ICD-185) 9)  Shortness of Breath  (ICD-786.05) 10)  Ventricular Fibrillation  (ICD-427.41) 11)  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)  (ICD-755.66) 12)  Back Pain With Radiculopathy/r Hip Pain   (ICD-729.2) 13)  Hand Pain, Right  (ICD-729.5) 14)  Mitral Regurgitation/ Mod-severe  (ICD-396.3) 15)  Aftercare, Long-term Use, Medications Nec  (ICD-V58.69) 16)  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) 17)  Cardiomyopathy, Primary, Resolved  (ICD-425.4) 18)  Pneumonia, Hx of  (ICD-V12.60) 19)  Pulmonary Nodule, Right Lower Lobe  (ICD-518.89) 20)  Atrophy, 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 One Inhale Every Morning 2)  Flomax 0.4 Mg Cp24 (Tamsulosin Hcl) .Marland Kitchen.. 1 Daily By Mouth 3)  Protonix 40 Mg Tbec (Pantoprazole Sodium) .... Take 1 Tablet By Mouth Once A Day 4)  Furosemide 40 Mg Tabs (Furosemide) .... Take One Tablet By Mouth Daily. 5)  Coreg 12.5 Mg Tabs (Carvedilol) .Marland Kitchen.. 1 By Mouth Two Times A Day 6)  Iron 325 (65 Fe) Mg Tabs (Ferrous Sulfate) .... 2 in The Morning and 2 in The Evening 7)  Amaryl 4 Mg Tabs (Glimepiride) .... One Tab By Mouth Daily 8)  Accu-Chek Aviva   Strp (Glucose Blood) .Marland KitchenMarland KitchenMarland Kitchen  Use 1 Daily and As Needed For Diabeties 9)  Adprin B 325 Mg Tabs (Aspirin Buf(Cacarb-Mgcarb-Mgo)) .Marland Kitchen.. 1 Daily By Mouth 10)  Colcrys 0.6 Mg Tabs (Colchicine) .Marland Kitchen.. 1 Tab By Mouth Two Times A Day As Needed Gout Pain 11)  Eplerenone 25 Mg Tabs (Eplerenone) .Marland Kitchen.. 1 By Mouth Daily 12)  Lisinopril 20 Mg Tabs (Lisinopril) .... 1/2  Tablet Two Times A Day 13)  Lipitor 20 Mg Tabs (Atorvastatin Calcium) .Marland Kitchen.. 1 Daily By Mouth At Bedtime 14)  Klor-Con M20 20 Meq Cr-Tabs (Potassium Chloride Crys Cr) .Marland Kitchen.. 1 Tablet Twice A Day By Mouth 15)  Avodart 0.5 Mg Caps (Dutasteride) .... One By Mouth Daily 16)   Vitamin B-12 2500 Mcg Subl (Cyanocobalamin) .... Take One By Mouth Daily 17)  Vitamin D3 2000 Unit Caps (Cholecalciferol) .Marland Kitchen.. 1 By Mouth Daily 18)  Gabapentin 100 Mg Caps (Gabapentin) .Marland Kitchen.. 1 Tab By Mouth At Bedtime For Peripharal Neuropathy.  Allergies (verified): No Known Drug Allergies  Past History:  Past medical, surgical, family and social histories (including risk factors) reviewed, and no changes noted (except as noted below).  Past Medical History: Reviewed history from 05/30/2009 and no changes required.  1. CHF secondary to nonischemic  cardiomyopathy      a.    ECHO 10/07, EF 20-25%, moderate to severe MR      b.    ECHO 1/10, EF 55-60%, mild MR      c.    ECHO  2/11 55-65%       c.    s/p Medtronic BiV ICD with biventricular function now turned        off due to diahragmatic stimulatiob      d.     Right heart catheterization November 05, 2006.  Right atrial        pressure mean of 12, RV pressure 36/8, PA pressure 39/16 with a        mean of 28, wedge pressure was 20.  Fick cardiac output was 5        liters per minute, cardiac index was 2.4.   2. Cardiac cath 01/2006, showed mild nonobstructive CAD  3. COPD GOLD II    - Spirometry July 10, 2008 > FEV1 1.46 56% predicted, ratio of 66%  4. Atrial fibrillation status post AV node ablation       a. not on coumadin due to GIB  5. Iron-deficiency anemia with previous severe GI bleed   6. Hypertension.   7. Hyperlipidemia.   8. Left bundle branch block  9. Diabetes 10.Benign prostatic hypertrophy 09/11/1997 11.Pneumonia, hx of 2-3 times as child 12.Chronic dyspnea 13. Obesity 14. Aborted VF arrest (shocked by ICD) November 2009  Past Surgical History: Reviewed history from 06/20/2008 and no changes required. Lung-lobectomy RLL 01/21/1998 EGD prepyloric ulcer, esoph ring, duod avm 09/1997 Colonoscopy polypectomy, divertics, int hemms 11/1997 [facility] GI bleed 02/07-02/12/2002 EGD poss Barrett's 05/22/2002 Colonoscopy  polyp,multip(neg) divertics,int hemm 06/26/2002 Colonoscopy polyps, divetics, int hemms 07/10/2004 [facility] SOB, AFIB, CHF 10/11-10/18/2007 Cath min obstruct dz severe LV dysftn EF 25% [facility] acute blood loss, anemia, sys HF, isch cardiomyopathy 11/08-11/03/2006 EGD HH No active bleeding 02/19/2006 Rotator cuff repair 1994 and 1995 L knee surgery 1996 CT Head   No acute abnmlty  10/03/2006 R heart Cath nml (Dr Bensihmon) 11/05/2006 HOSP Acute on Chronis CHF IIIB Nonisch Cardiomyop EF 20-25% Mod-Sev MR 8/14-8/23/08 Colonoscopy 2 Polyps Divertics Int Hemms (Dr Marina Goodell) 06/19/08    Family History: Reviewed history from 07/31/2009 and no  changes required. Father dec 77 CHF (Smoker) Mother dec 81 CHF Brother A 85 Allyne Gee)  Lung Ca Brother A 67 (Burnie) Liver ETOH COPD (disability) Brother A 59 (Doug) Nerves (disability) COPD Brother A 10 Peyton Najjar) Sister A 45 HTN Sister A 66 HTN Obesity  Social History: Reviewed history from 07/10/2008 and no changes required. Occupation: [employer] Retired Married lives w/ wife 1 adopted Current smoker.  Smoker since age 64.  Quit for 2 years and started back in Dec 2009.  Smokes 1 ppd. Alcohol use-yes Drug use-no  Review of Systems General:  Denies fatigue and fever. CV:  Denies chest pain or discomfort. Resp:  Denies shortness of breath. GI:  Complains of constipation; Impaction in hospital..Marland KitchenFleets enema did not work, had to have a disimpaction. taking milk of Mg and mineral oil... helping a lot..  Physical Exam  General:  elderly male in NAD Mouth:  MMM  Neck:  wearing neck brace Lungs:  Normal respiratory effort, chest expands symmetrically. Lungs are clear to auscultation, no crackles or wheezes. Heart:  Normal rate and regular rhythm. S1 and S2 normal without gallop, click, rub or other extra sounds. II-III/VI sys murmur precordially, heard best at upper right  sternal border. Abdomen:  Bowel sounds positive,abdomen soft and non-tender without  masses, organomegaly or hernias noted. Pulses:  R and L posterior tibial pulses are full and equal bilaterally  Extremities:  no edema  Skin:  Intact without suspicious lesions or rashes Psych:  Cognition and judgment appear intact. Alert and cooperative with normal attention span and concentration. No apparent delusions, illusions, hallucinations   Impression & Recommendations:  Problem # 1:  NECK  WITH LEFT ARM RADICULOPATHY (ICD-723.1) Assessment Improved S/P neck surgery. Doniing well.  His updated medication list for this problem includes:    Adprin B 325 Mg Tabs (Aspirin buf(cacarb-mgcarb-mgo)) .Marland Kitchen... 1 daily by mouth  Problem # 2:  HYPERTENSION (ICD-401.9) Assessment: Deteriorated Lower BPs, usually asymptomatic. Hold norvasc continue lisinopril and coreg. Follow and call with BPs and pulse.  The following medications were removed from the medication list:    Norvasc 2.5 Mg Tabs (Amlodipine besylate) .Marland Kitchen... 1 by mouth daily His updated medication list for this problem includes:    Furosemide 40 Mg Tabs (Furosemide) .Marland Kitchen... Take one tablet by mouth daily.    Coreg 12.5 Mg Tabs (Carvedilol) .Marland Kitchen... 1 by mouth two times a day    Eplerenone 25 Mg Tabs (Eplerenone) .Marland Kitchen... 1 by mouth daily    Lisinopril 20 Mg Tabs (Lisinopril) .Marland Kitchen... 1/2  tablet two times a day  Problem # 3:  ? of OSTEOPOROSIS (ICD-733.00) Assessment: New Per neurosurgeon per daughter.. pt denies told this.  Eval with DXA.  His updated medication list for this problem includes:    Vitamin D3 2000 Unit Caps (Cholecalciferol) .Marland Kitchen... 1 by mouth daily  Orders: Radiology Referral (Radiology)  Problem # 4:  CONSTIPATION, CHRONIC (ICD-564.09)  Discussed dietary fiber measures and increased water intake.   Use miralax as needed.   Complete Medication List: 1)  Spiriva Handihaler 18 Mcg Caps (Tiotropium bromide monohydrate) .... One inhale every morning 2)  Flomax 0.4 Mg Cp24 (Tamsulosin hcl) .Marland Kitchen.. 1 daily by mouth 3)   Protonix 40 Mg Tbec (Pantoprazole sodium) .... Take 1 tablet by mouth once a day 4)  Furosemide 40 Mg Tabs (Furosemide) .... Take one tablet by mouth daily. 5)  Coreg 12.5 Mg Tabs (Carvedilol) .Marland Kitchen.. 1 by mouth two times a day 6)  Iron 325 (65 Fe) Mg  Tabs (Ferrous sulfate) .... 2 in the morning and 2 in the evening 7)  Amaryl 4 Mg Tabs (Glimepiride) .... One tab by mouth daily 8)  Accu-chek Aviva Strp (Glucose blood) .... Use 1 daily and as needed for diabeties 9)  Adprin B 325 Mg Tabs (Aspirin buf(cacarb-mgcarb-mgo)) .Marland Kitchen.. 1 daily by mouth 10)  Colcrys 0.6 Mg Tabs (Colchicine) .Marland Kitchen.. 1 tab by mouth two times a day as needed gout pain 11)  Eplerenone 25 Mg Tabs (Eplerenone) .Marland Kitchen.. 1 by mouth daily 12)  Lisinopril 20 Mg Tabs (Lisinopril) .... 1/2  tablet two times a day 13)  Lipitor 20 Mg Tabs (Atorvastatin calcium) .Marland Kitchen.. 1 daily by mouth at bedtime 14)  Klor-con M20 20 Meq Cr-tabs (Potassium chloride crys cr) .Marland Kitchen.. 1 tablet twice a day by mouth 15)  Avodart 0.5 Mg Caps (Dutasteride) .... One by mouth daily 16)  Vitamin B-12 2500 Mcg Subl (Cyanocobalamin) .... Take one by mouth daily 17)  Vitamin D3 2000 Unit Caps (Cholecalciferol) .Marland Kitchen.. 1 by mouth daily 18)  Gabapentin 100 Mg Caps (Gabapentin) .Marland Kitchen.. 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  Patient Instructions: 1)  Referral Appointment Information 2)  Day/Date: 3)  Time: 4)  Place/MD: 5)  Address: 6)  Phone/Fax: 7)  Patient given appointment information. Information/Orders faxed/mailed.  8)   Stop amlodipine. 9)   Follow BP/pulse  at home... call with measurements in next 2 weeks.  10)  Tobacco is very bad for your health and your loved ones ! You should stop smoking !  11)  Please schedule a follow-up appointment in 1 month BP appt. 12)   Increase fiber in diet, increase water in diet. 13)  May try miralax OTC for constipation if needed.    Orders Added: 1)  Radiology Referral [Radiology] 2)  Est. Patient Level IV  [[id number]]    Current Allergies (reviewed today): No known allergies

## 2010-05-15 NOTE — Assessment & Plan Note (Signed)
Summary: LEFT FOOT,TOE ON R FOOT/CLE   Vital Signs:  Patient profile:   77 year old male Height:      64.5 inches Weight:      190.50 pounds BMI:     32.31 Temp:     97.3 degrees F oral Pulse rate:   72 / minute Pulse rhythm:   regular BP sitting:   100 / 64  (left arm) Cuff size:   regular  Vitals Entered By: Benny Lennert CMA Duncan Dull) (April 02, 2010 11:50 AM)   History of Present Illness: Chief complaint ? broken left foot and toe on right foot  77 year old elderly gentleman who recently had neck surgery, fell, and struck his left foot and right foot yesterday. Date of injury, April 01, 2010.  He has bruising in his right second and third toes. Mostly in around the second third toe, at the base, and does have some degree of pain, mostly in the third toe.  Left side, there is diffuse bruising and ecchymosis. He is able walk and bear weight, but has pain diffusely. He has not required any narcotic pain medications.  He is still in a hard collar cervical collar for his recent neck surgery.  He does live with his wife, and his daughter does live next door, assist with his bathing and with all meal preparation.  All films reviewed with the patient and his daughter. The patient is already in a hard neck brace for neck surgery, prior.  Patient checked in at 11:25 AM. I started examining the patient prior to vitals being entered at 11:50 AM.  Patient Left room, examination: 13: 35 PM   X-ray Musculoskeletal  Procedure date:  04/02/2010  Findings:      DG ANKLE COMPLETE*L* - [id number]   Clinical Data: Larey Seat 2 days ago with foot and ankle pain   LEFT ANKLE COMPLETE - 3+ VIEW   Comparison: None.   Findings: There is a small avulsion fracture fragment from the distal left fibular tip with adjacent soft tissue swelling.  The ankle joint appears normal.  On the lateral view there may be a small avulsion fracture fragment from the dorsal aspect of the talus.     IMPRESSION:   1.  Small avulsion fracture fragment from the DIP of the distal left fibula. 2.  Question small avulsion fragment from the talus dorsally.   Read By:  Juline Patch,  M.D.     Released By:  Juline Patch,  M.D.  X-ray Musculoskeletal  Procedure date:  04/02/2010  Findings:      DG FOOT COMPLETE*R* - [id number]   Clinical Data: Larey Seat with foot and ankle pain   RIGHT FOOT COMPLETE - 3+ VIEW   Comparison: None.   Findings: On the oblique view there is a small bony fragment adjacent to the base of the proximal phalanx of the right second toe which may represent a small fracture fragment.  The bones are osteopenic.  No other acute fracture is seen.  There is degenerative change at the articulation of the base of the first metatarsal with the first cuneiform.   IMPRESSION: Cannot exclude small avulsion fracture fragment from the base of the proximal phalanx of the right second toe.   Read By:  Juline Patch,  M.D.     Released By:  Juline Patch,  M.D.   X-ray Musculoskeletal  Procedure date:  04/02/2010  Findings:      DG TOE 3RD*R* - [id number]  Clinical Data: Pain, fell 2 days ago   RIGHT TOE - 2+ VIEW   Comparison: None.   Findings: There are degenerative changes involving the right third toe.  However, no acute fracture is seen.   IMPRESSION:   No acute fracture of the right third toe is seen.   Read By:  Juline Patch,  M.D.     Released By:  Juline Patch,  M.D.  X-ray Musculoskeletal  Procedure date:  04/02/2010  Findings:      DG FOOT COMPLETE*L* - [id number]   Clinical Data: Larey Seat 2 days ago with pain and swelling   LEFT FOOT - COMPLETE 3+ VIEW   Comparison: None.   Findings: Tarsal - metatarsal alignment is normal.  There is a small bone fragment adjacent to the lateral aspect of the cuboid which could represent a small avulsion fracture fragment.  On the lateral view there is a small bony density adjacent to the talus on the  dorsal aspect which could also represent a small avulsion fracture fragment.  No other acute abnormality is seen.   IMPRESSION: .  Possible small avulsion fracture fragment from the lateral aspect of the cuboid. 2.  Question small fracture fragment from the dorsal aspect of the talus.   Read By:  Juline Patch,  M.D.     Released By:  Juline Patch,  M.D.  Allergies (verified): No Known Drug Allergies  Past History:  Past medical, surgical, family and social histories (including risk factors) reviewed, and no changes noted (except as noted below).  Past Medical History: Reviewed history from 05/30/2009 and no changes required.  1. CHF secondary to nonischemic  cardiomyopathy      a.    ECHO 10/07, EF 20-25%, moderate to severe MR      b.    ECHO 1/10, EF 55-60%, mild MR      c.    ECHO  2/11 55-65%       c.    s/p [employer]onic BiV ICD with biventricular function now turned        off due to diahragmatic stimulatiob      d.     Right heart catheterization November 05, 2006.  Right atrial        pressure mean of 12, RV pressure 36/8, PA pressure 39/16 with a        mean of 28, wedge pressure was 20.  Fick cardiac output was 5        liters per minute, cardiac index was 2.4.   2. Cardiac cath 01/2006, showed mild nonobstructive CAD  3. COPD GOLD II    - Spirometry July 10, 2008 > FEV1 1.46 56% predicted, ratio of 66%  4. Atrial fibrillation status post AV node ablation       a. not on coumadin due to GIB  5. Iron-deficiency anemia with previous severe GI bleed   6. Hypertension.   7. Hyperlipidemia.   8. Left bundle branch block  9. Diabetes 10.Benign prostatic hypertrophy 09/11/1997 11.Pneumonia, hx of 2-3 times as child 12.Chronic dyspnea 13. Obesity 14. Aborted VF arrest (shocked by ICD) November 2009  Past Surgical History: Reviewed history from 06/20/2008 and no changes required. Lung-lobectomy RLL 01/21/1998 EGD prepyloric ulcer, esoph ring, duod avm  09/1997 Colonoscopy polypectomy, divertics, int hemms 11/1997 [facility] GI bleed 02/07-02/12/2002 EGD poss Barrett's 05/22/2002 Colonoscopy polyp,multip(neg) divertics,int hemm 06/26/2002 Colonoscopy polyps, divetics, int hemms 07/10/2004 [facility] SOB, AFIB, CHF 10/11-10/18/2007 Cath min obstruct dz severe LV  dysftn EF 25% [facility] acute blood loss, anemia, sys HF, isch cardiomyopathy 11/08-11/03/2006 EGD HH No active bleeding 02/19/2006 Rotator cuff repair 1994 and 1995 L knee surgery 1996 CT Head   No acute abnmlty  10/03/2006 R heart Cath nml (Dr Bensihmon) 11/05/2006 [facility]OS[facility] Acute on Chronis CHF IIIB Nonisch Cardiomyop EF 20-25% Mod-Sev MR 8/14-8/23/08 Colonoscopy 2 Polyps Divertics Int Hemms (Dr Marina Goodell) 06/19/08    Family History: Reviewed history from 07/31/2009 and no changes required. Father dec 77 CHF (Smoker) Mother dec 81 CHF Brother A 38 Allyne Gee)  Lung Ca Brother A 67 (Burnie) Liver ETOH COPD (disability) Brother A 59 (Doug) Nerves (disability) COPD Brother A 14 Peyton Najjar) Sister A 40 HTN Sister A 66 HTN Obesity  Social History: Reviewed history from 07/10/2008 and no changes required. Occupation: [employer] Retired Married lives w/ wife 1 adopted Current smoker.  Smoker since age 75.  Quit for 2 years and started back in Dec 2009.  Smokes 1 ppd. Alcohol use-yes Drug use-no  Review of Systems       Otherwise, the pertinent positives and negatives are listed above and in the HPI, otherwise a full review of systems has been reviewed and is negative unless noted positive.  General:  Denies chills, fatigue, and fever. CV:  Complains of swelling of feet; denies chest pain or discomfort and shortness of breath with exertion. MS:  Complains of joint pain, muscle, cramps, and stiffness. Neuro:  Complains of tingling.  Physical Exam  General:  Well-developed,well-nourished,in no acute distress; alert,appropriate and cooperative throughout examination Head:  normocephalic and atraumatic.    Ears:  no external deformities.   Nose:  no external deformity.   Lungs:  normal respiratory effort.   Msk:  right foot: Tender to palpation in the second and third toes, with diffuse bruising and a phimosis in this area. Nontender along all metatarsals, and all bones of the forefoot, midfoot, and ankle.  Left foot: Nontender with fibular squeeze. Nontender at the fibular proximal or tibia proximally.  Distal fibula is exquisitely tender.  Medial malleolus is nontender.  Tender at the tail [place].  Tender at the cuboid. Medial aspect of the midfoot. And lateral aspect of the midfoot and around the region of the cuboid. Nontender along all metatarsals including the fifth metatarsal head.  Nontender along all phalanges and with good phalanges motion.  diffuse left eccymosis Drawer testing on the left causes pain. Pulses:  DP and PT slightly diminished   Impression & Recommendations:  Problem # 1:  FOOT PAIN, LEFT (ICD-729.5) Consistent clinically and radiographically with L cuboid fracture, R toe fracture, and small L talus fracture, with an additional avulsion fracture of the L lateral malleolus / distal fibula.  Additional time spent in the diagnosis of these problems, with well more than 1 1/2 hours spent in face to face time spent in coordination of care. I called and discussed the case with Dr. Gery Pray from Radiology myself, discussed need for CT of L foot, but he felt the plain films were adequate and suggested follow-up. Later, he called me again and felt that there was a small 2nd toe fx on the R.  Time frames noted above.  Orders: T-Foot Left Min 3 Views (73630TC) Prolonged Service office o/p dir contact 1st hr ([id number])  Problem # 2:  ANKLE PAIN, LEFT (ICD-719.47)  Orders: T-Ankle Comp Left Min 3 Views (73610TC) Prolonged Service office o/p dir contact 1st hr ([id number])  Problem # 3:  ANKLE PAIN, RIGHT (ICD-719.47)  Orders:  T-Foot Right (73630TC) Prolonged Service office  o/p dir contact 1st hr ([id number])  Problem # 4:  CLOSED FRACTURE OF CUBOID BONE (ICD-825.23) Closed fracture of the cuboid.  Immobilized in a Aircast XP, made NWB for the next 2 weeks, expect 6-8 weeks of immobilization. Placed patient in a wheelchair. f/u with repeat films in 2 weeks.  Pneumatic compression has clearly been demonstrated to decrease swelling, aid in proprioception long term, improve recovery time and decrease time in physical therapy.   A UNIVERSAL FRACTURE CHARGE HAS BEEN APPLIED IN THE CARE OF THIS INJURY.  No co-pay should be applied in the future, and there is a 90 day follow-up period for subsequent care of this injury.   Orders: Tarsal Bone Fx (60454)  Problem # 5:  CLOSED FRACTURE OF LATERAL MALLEOLUS (ICD-824.2) Assessment: New Left distal fibula / distal lateral malleolus  Immobilized in a Aircast XP, made NWB for the next 2 weeks, expect 6-8 weeks of immobilization. Placed patient in a wheelchair. f/u with repeat films in 2 weeks.  Pneumatic compression has clearly been demonstrated to decrease swelling, aid in proprioception long term, improve recovery time and decrease time in physical therapy.   A UNIVERSAL FRACTURE CHARGE HAS BEEN APPLIED IN THE CARE OF THIS INJURY.  No co-pay should be applied in the future, and there is a 90 day follow-up period for subsequent care of this injury.   Orders: Lat. Malleolus Fx ([id number])  Problem # 6:  FRACTURE, TOE (ICD-826.0) Assessment: New R 2nd toe  supportive care, patient with decreased neurological sensation and mild pain now, tylenol as needed   A UNIVERSAL FRACTURE CHARGE HAS BEEN APPLIED IN THE CARE OF THIS INJURY.  No co-pay should be applied in the future, and there is a 90 day follow-up period for subsequent care of this injury.   Orders: Prolonged Service office o/p dir contact 1st hr ([id number]) Toe Fx ([id number])  Problem # 7:  OTHER CLOSED FRACTURE OF TARSAL&METATARSAL BONES (ICD-825.29) Assessment:  New Closed fracture of the talus, LEFT  Immobilized in a Aircast XP, made NWB for the next 2 weeks, expect 6-8 weeks of immobilization. Placed patient in a wheelchair. f/u with repeat films in 2 weeks.  Pneumatic compression has clearly been demonstrated to decrease swelling, aid in proprioception long term, improve recovery time and decrease time in physical therapy.   A UNIVERSAL FRACTURE CHARGE HAS BEEN APPLIED IN THE CARE OF THIS INJURY.  No co-pay should be applied in the future, and there is a 90 day follow-up period for subsequent care of this injury.   Orders: Tarsal Bone Fx ([id number])  Complete Medication List: 1)  Spiriva Handihaler 18 Mcg Caps (Tiotropium bromide monohydrate) .... One inhale every morning 2)  Flomax 0.4 Mg Cp24 (Tamsulosin hcl) .Marland Kitchen.. 1 daily by mouth 3)  Protonix 40 Mg Tbec (Pantoprazole sodium) .... Take 1 tablet by mouth once a day 4)  Furosemide 40 Mg Tabs (Furosemide) .... Take one tablet by mouth daily. 5)  Coreg 12.5 Mg Tabs (Carvedilol) .Marland Kitchen.. 1 by mouth two times a day 6)  Iron 325 (65 Fe) Mg Tabs (Ferrous sulfate) .... 2 in the morning and 2 in the evening 7)  Amaryl 4 Mg Tabs (Glimepiride) .... One tab by mouth daily 8)  Accu-chek Aviva Strp (Glucose blood) .... Use 1 daily and as needed for diabeties 9)  Adprin B 325 Mg Tabs (Aspirin buf(cacarb-mgcarb-mgo)) .Marland Kitchen.. 1 daily by mouth 10)  Colcrys 0.6 Mg Tabs (Colchicine) .Marland Kitchen.. 1 tab by  mouth two times a day as needed gout pain 11)  Eplerenone 25 Mg Tabs (Eplerenone) .Marland Kitchen.. 1 by mouth daily 12)  Lisinopril 20 Mg Tabs (Lisinopril) .... 1/2  tablet two times a day 13)  Lipitor 20 Mg Tabs (Atorvastatin calcium) .Marland Kitchen.. 1 daily by mouth at bedtime 14)  Klor-con M20 20 Meq Cr-tabs (Potassium chloride crys cr) .Marland Kitchen.. 1 tablet twice a day by mouth 15)  Avodart 0.5 Mg Caps (Dutasteride) .... One by mouth daily 16)  Vitamin B-12 2500 Mcg Subl (Cyanocobalamin) .... Take one by mouth daily 17)  Vitamin D3 2000 Unit Caps  (Cholecalciferol) .Marland Kitchen.. 1 by mouth daily 18)  Gabapentin 100 Mg Caps (Gabapentin) .Marland Kitchen.. 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 19)  Wheelchair, Standard Rolling  .... Length of need = 99 years (perm) dx codes: 825.23, 824.2, 826.0, v45.02  Other Orders: T-Toe(s) (73660TC)  Patient Instructions: 1)  f/u 2 weeks 2)  (NO COPAY, DR. Patsy Lager ONLY) Prescriptions: WHEELCHAIR, STANDARD ROLLING Length of need = 99 years (perm) Dx codes: 825.23, 824.2, 826.0, v45.02  #1 x 0   Entered and Authorized by:   Hannah Beat MD   Signed by:   Hannah Beat MD on 04/02/2010   Method used:   Print then Give to Patient   RxID:   [id number]    Orders Added: 1)  T-Toe(s) [73660TC] 2)  T-Foot Right [73630TC] 3)  T-Foot Left Min 3 Views [73630TC] 4)  T-Ankle Comp Left Min 3 Views [73610TC] 5)  Est. Patient Level IV [14782] 6)  Prolonged Service office o/p dir contact 1st hr [[id number]] 7)  Toe Fx [28510] 8)  Lat. Malleolus Fx [[id number]] 9)  Tarsal Bone Fx [28450] 10)  Tarsal Bone Fx [28450]    Current Allergies (reviewed today): No known allergies  Appended Document: LEFT FOOT,TOE ON R FOOT/CLE DME documentation: Face to face encounter documentation follows: Patient needs wheelchair services for the following reasons:  1. Nonweightbearing due to acute fracture 16. 77 years old and intermittently with balance problems disrupting activities of daily living balance, potential fall risk for a long time. length of need = 99 years

## 2010-05-15 NOTE — Letter (Signed)
Summary: Remote Device Check  [facility], Main Office  [address]   [place], [place] [address]   Phone: [phone]  Fax: [phone]     Aug 21, 2009 MRN: [id number]   [name] [address] [place], [place]  [address]   Dear Mr. Nasser,   Your remote transmission was recieved and reviewed by your physician.  All diagnostics were within normal limits for you.  __X___Your next transmission is scheduled for:    11-14-09.  Please transmit at any time this day.  If you have a wireless device your transmission will be sent automatically.    Sincerely,  Vella Kohler

## 2010-05-15 NOTE — Assessment & Plan Note (Signed)
Summary: pc2/jss      Allergies Added: NKDA  Visit Type:  Pacemaker check Referring Provider:  Dr. Marikay Alar Primary Provider:  [name] MD  CC:  no complaints.  History of Present Illness:  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   Underwent ICD change out in Jan 2011 for ERI without problem. There has been a history of diaphragmatic stimulation. However, following his most recent change out we are able to program around that.  In November 2010, he had syncopal episode and ICD showed VF with appropriate therapy.   had echoFeb 2011 confirming EF  55-65%   The patient denies SOB, chest pain, edema or palpitations.  he continues to smoke. Resuming low-grade smoking was associated with a 35 pound weight loss with which he feels much better. 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)  (ICD-755.66) 9)  Back Pain With Radiculopathy/r Hip Pain  (ICD-729.2) 10)  Syncope  (ICD-780.2) 11)  Hand Pain, Right  (ICD-729.5) 12)  Mitral Regurgitation/ Mod-severe  (ICD-396.3) 13)  Aftercare, Long-term Use, Medications Nec  (ICD-V58.69) 14)  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) 15)  Cardiomyopathy, Primary, Dilated  (ICD-425.4) 16)  Pneumonia, Hx of  (ICD-V12.60) 17)  Failure, Systolic Heart, Chronic Ef 25%  (ICD-428.22) 18)  Pulmonary Nodule, Right Lower Lobe  (ICD-518.89) 19)  Atrophy, 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 One Inhale Every Morning 2)  Flomax 0.4 Mg Cp24 (Tamsulosin Hcl) .Marland Kitchen.. 1 Daily By Mouth 3)  Protonix 40 Mg Tbec (Pantoprazole Sodium) .... Take 1 Tablet By Mouth Once A Day 4)  Lasix 80 Mg Tabs (Furosemide) .... 1/2 Tablet By Mouth in Am 5)  Coreg 12.5 Mg Tabs (Carvedilol) .Marland Kitchen.. 1 By Mouth Two Times A Day 6)  Iron 325 (65 Fe) Mg Tabs (Ferrous Sulfate) .... 2 in The Morning and 2 in The Evening 7)  Amaryl 4 Mg Tabs (Glimepiride) .... One Tab By Mouth Qd 8)  Accu-Chek Aviva   Strp (Glucose Blood) .... Use 1 Daily and As Needed For Diabeties 9)  Adprin B 325 Mg Tabs (Aspirin Buf(Cacarb-Mgcarb-Mgo)) .Marland Kitchen.. 1 Daily By Mouth 10)  Colchicine 0.6 Mg Tabs (Colchicine) .Marland Kitchen.. 1 Twice A Day As Needed For Gout Pain 11)  Norvasc 2.5 Mg Tabs (Amlodipine Besylate) .Marland Kitchen.. 1 By Mouth Daily 12)  Eplerenone 25 Mg Tabs (Eplerenone) .Marland Kitchen.. 1 By Mouth Daily 13)  Lisinopril 20 Mg Tabs (Lisinopril) .... 1/2  Tablet Two Times A Day 14)  Lipitor 20 Mg Tabs (Atorvastatin Calcium) .Marland KitchenMarland KitchenMarland Kitchen  1 Daily By Mouth At Bedtime 15)  Klor-Con M20 20 Meq Cr-Tabs (Potassium Chloride Crys Cr) .Marland Kitchen.. 1 Tablet Twice A Day By Mouth 16)  Avodart 0.5 Mg Caps (Dutasteride) .... One By Mouth Daily 17)  Vitamin B-12 2500 Mcg Subl (Cyanocobalamin) .... Take One By Mouth Daily  Allergies (verified): No Known Drug Allergies  Past History:  Past Medical History: Last updated: 05/30/2009  1. CHF secondary to nonischemic  cardiomyopathy      a.    ECHO 10/07, EF 20-25%,  moderate to severe MR      b.    ECHO 1/10, EF 55-60%, mild MR      c.    ECHO  2/11 55-65%       c.    s/p Medtronic BiV ICD with biventricular function now turned        off due to diahragmatic stimulatiob      d.     Right heart catheterization November 05, 2006.  Right atrial        pressure mean of 12, RV pressure 36/8, PA pressure 39/16 with a        mean of 28, wedge pressure was 20.  Fick cardiac output was 5        liters per minute, cardiac index was 2.4.   2. Cardiac cath 01/2006, showed mild nonobstructive CAD  3. COPD GOLD II    - Spirometry July 10, 2008 > FEV1 1.46 56% predicted, ratio of 66%  4. Atrial fibrillation status post AV node ablation       a. not on coumadin due to GIB  5. Iron-deficiency anemia with previous severe GI bleed   6. Hypertension.   7. Hyperlipidemia.   8. Left bundle branch block  9. Diabetes 10.Benign prostatic hypertrophy 09/11/1997 11.Pneumonia, hx of 2-3 times as child 12.Chronic dyspnea 13. Obesity 14. Aborted VF arrest (shocked by ICD) November 2009  Past Surgical History: Last updated: 06/20/2008 Lung-lobectomy RLL 01/21/1998 EGD prepyloric ulcer, esoph ring, duod avm 09/1997 Colonoscopy polypectomy, divertics, int hemms 11/1997 [facility] GI bleed 02/07-02/12/2002 EGD poss Barrett's 05/22/2002 Colonoscopy polyp,multip(neg) divertics,int hemm 06/26/2002 Colonoscopy polyps, divetics, int hemms 07/10/2004 [facility] SOB, AFIB, CHF 10/11-10/18/2007 Cath min obstruct dz severe LV dysftn EF 25% [facility] acute blood loss, anemia, sys HF, isch cardiomyopathy 11/08-11/03/2006 EGD HH No active bleeding 02/19/2006 Rotator cuff repair 1994 and 1995 L knee surgery 1996 CT Head   No acute abnmlty  10/03/2006 R heart Cath nml (Dr Bensihmon) 11/05/2006 HOSP Acute on Chronis CHF IIIB Nonisch Cardiomyop EF 20-25% Mod-Sev MR 8/14-8/23/08 Colonoscopy 2 Polyps Divertics Int Hemms (Dr Marina Goodell) 06/19/08    Family History: Last updated: 07/17/2008 Father dec 77 CHF  (Smoker) Mother dec 81 CHF Brother A 73 Allyne Gee)  Lung Ca Brother A 66 (Burnie) Liver ETOH COPD (disability) Brother A 58 (Doug) Nerves (disability) Brother A 51 Peyton Najjar) Sister A HTN Sister A HTN  Social History: Last updated: 07/10/2008 Occupation: [employer] Retired Married lives w/ wife 1 adopted Current smoker.  Smoker since age 75.  Quit for 2 years and started back in Dec 2009.  Smokes 1 ppd. Alcohol use-yes Drug use-no  Risk Factors: Alcohol Use: <1 (07/17/2008) Exercise: no (07/17/2008)  Risk Factors: Smoking Status: current (05/30/2009) Packs/Day: 1.0 (05/30/2009) Passive Smoke Exposure: no (07/17/2008)  Vital Signs:  Patient profile:   77 year old male Height:      64.5 inches Weight:      190.25 pounds BMI:     32.27  Pulse rate:   84 / minute Pulse rhythm:   regular BP sitting:   96 / 58  (left arm) Cuff size:   regular  Vitals Entered By: Mercer Pod (June 03, 2009 9:07 AM)  Physical Exam  General:  The patient was alert and oriented in no acute distress. HEENT Normal raspy voice  Neck veins were flat, carotids were brisk.  Lungs were clear.  Heart sounds were regular without murmurs or gallops.  Abdomen was soft with active bowel sounds. There is no clubbing cyanosis or edema. Skin Warm and dry neuro grossly normal    ICD Specifications Following MD:  Sherryl Manges, MD     Referring MD:  [facility] ICD Vendor:  Medtronic     ICD Model Number:  [id number]     ICD Serial Number:  [id number] ICD DOI:  04/19/2009     ICD Implanting MD:  Sherryl Manges, MD  Lead 1:    Location: RA     DOI: 08/24/2006     Model #: 1478     Serial #: [id number]     Status: active Lead 2:    Location: RV     DOI: 08/24/2006     Model #: 5784     Serial #: [id number]     Status: active Lead 3:    Location: LV     DOI: 08/24/2006     Model #: 1324     Serial #: [id number]     Status: active  Indications::  NICM, CHF  Explantation Comments: 04/19/2009  Medtronic Sentry [id number] H explanted  ICD Follow Up Remote Check?  No Battery Voltage:  3.19 V     Charge Time:  8.2 seconds     Underlying rhythm:  AFIB WITH CHB ICD Dependent:  Yes       ICD Device Measurements Atrium:  Amplitude: 0.5 mV, Impedance: 494 ohms,  Right Ventricle:  Amplitude: PACED AT 30 mV, Impedance: 741 ohms, Threshold: 0.75 V at 0.4 msec Left Ventricle:  Impedance: 494 ohms, Threshold: 1.75 V at 0.5 msec Configuration: LV TIP TO RV COIL Shock Impedance: 41/53 ohms   Episodes Percent Mode Switch:  100%     Coumadin:  No Shock:  0     ATP:  0     Nonsustained:  0     Ventricular Pacing:  99.3%  Brady Parameters Mode VVIR     Lower Rate Limit:  70     Upper Rate Limit 120  Tachy Zones VF:  200     VT:  OFF     VT1:  171     Next Remote Date:  09/03/2009     Next Cardiology Appt Due:  05/14/2010 Tech Comments:  Normal device function.  No changes made today.  LV output programmed 2V at 1.93msec with no diaphragmatic stim.  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  ROV 12 months SK. Gypsy Balsam RN BSN  June 03, 2009 9:17 AM   Impression & Recommendations:  Problem # 1:  IMPLANTABLE  DEFIBRILLATOR CRT - MDT (ICD-V45.02) Device parameters and data were reviewed and no changes were made    Problem # 2:  CARDIOMYOPATHY, PRIMARY, RESOLVED (ICD-425.4) Assessment: Deteriorated continue him on his current medications. I will asked Dr. Dorthea Cove to address whether epleronone is potentially discontinuable  Problem # 3:  ATRIAL FIBRILLATION S/P AV ABLATION (ICD-427.31) permanent; he is currently managed with aspirin. I wonder whether now remote from his GI bleed he could be considered for oral  anticoagulation therapy. His updated medication list for this problem includes:    Coreg 12.5 Mg Tabs (Carvedilol) .Marland Kitchen... 1 by mouth two times a day    Adprin B 325 Mg Tabs (Aspirin buf(cacarb-mgcarb-mgo)) .Marland Kitchen... 1 daily by mouth  Problem # 4:  VENTRICULAR FIBRILLATION (ICD-427.41) no  recurrent ventricular arrhythmias His updated medication list for this problem includes:    Coreg 12.5 Mg Tabs (Carvedilol) .Marland Kitchen... 1 by mouth two times a day    Adprin B 325 Mg Tabs (Aspirin buf(cacarb-mgcarb-mgo)) .Marland Kitchen... 1 daily by mouth    Norvasc 2.5 Mg Tabs (Amlodipine besylate) .Marland Kitchen... 1 by mouth daily    Lisinopril 20 Mg Tabs (Lisinopril) .Marland Kitchen... 1/2  tablet two times a day  Problem # 5:  TOBACCO ABUSE (ICD-305.1) encouraged again to discontinue smoking

## 2010-05-15 NOTE — Assessment & Plan Note (Signed)
Summary: F6M/AMD      Allergies Added: NKDA  Visit Type:  Follow-up Referring Provider:  Dr. Marikay Alar Primary Provider:  [name] MD  CC:  has "raw spot" where pacer is occasionally at night, SOB consistent, and pt started smoking again.  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 Remainder of medical history is notable for COPD, diabetes, hypertension and hyperlipidemia. He returns today for routine followup.  In November 2010, he had syncopal episode and ICD showed VF with appropriate therapy.   Underwent ICD change out in Jan 2011 for ERI without problem. Had echo Feb earlier this month ef 55-65%. Had some soreness over site but no drainage or skin breakdown.. No resolved.  There have been no ICD discharges or tachypalpitations or syncope. No CP or HF symptoms. has to walk slowly due to back pain from degenerative disk disease. Chronic SOB. Back to smoking a 1/2 to 1 ppd cigarettes. No palpitations. After resuming smoking, has lost 35 pounds and feels much better.  Preventive Screening-Counseling & Management  Alcohol-Tobacco     Smoking Status: current     Packs/Day: 1.0  Current Medications (verified): 1)  Spiriva Handihaler 18 Mcg Caps (Tiotropium Bromide Monohydrate) .... One Inhale Every Morning 2)  Flomax 0.4 Mg Cp24 (Tamsulosin Hcl) .Marland Kitchen.. 1 Daily By Mouth 3)  Protonix 40 Mg Tbec (Pantoprazole Sodium) .... Take 1 Tablet By Mouth Once A Day 4)  Lasix 80 Mg Tabs (Furosemide) .... 1/2 Tablet By Mouth in Am 5)  Coreg 12.5 Mg Tabs (Carvedilol) .Marland Kitchen.. 1 By Mouth Two Times A Day 6)  Iron 325 (65 Fe) Mg Tabs (Ferrous Sulfate) .... 2 in The Morning and 2 in The Evening 7)  Amaryl 4 Mg Tabs (Glimepiride) .... One Tab By Mouth Qd 8)  Accu-Chek  Aviva   Strp (Glucose Blood) .... Use 1 Daily and As Needed For Diabeties 9)  Adprin B 325 Mg Tabs (Aspirin Buf(Cacarb-Mgcarb-Mgo)) .Marland Kitchen.. 1 Daily By Mouth 10)  Colchicine 0.6 Mg Tabs (Colchicine) .Marland Kitchen.. 1 Twice A Day As Needed For Gout Pain 11)  Norvasc 2.5 Mg Tabs (Amlodipine Besylate) .Marland Kitchen.. 1 By Mouth Daily 12)  Eplerenone 25 Mg Tabs (Eplerenone) .Marland Kitchen.. 1 By Mouth Daily 13)  Lisinopril 20 Mg Tabs (Lisinopril) .... 1/2  Tablet Two Times A Day 14)  Lipitor 20 Mg Tabs (Atorvastatin Calcium) .Marland Kitchen.. 1 Daily By Mouth At Bedtime 15)  Klor-Con M20 20 Meq Cr-Tabs (Potassium Chloride Crys Cr) .Marland Kitchen.. 1 Tablet Twice A Day By Mouth 16)  Avodart 0.5 Mg Caps (Dutasteride) .... One By Mouth Daily 17)  Vitamin B-12 2500 Mcg Subl (Cyanocobalamin) .... Take One By Mouth Daily  Allergies (verified): No Known Drug Allergies  Past History:  Past Medical History:  1. CHF secondary to nonischemic  cardiomyopathy      a.    ECHO 10/07, EF 20-25%, moderate to severe MR      b.    ECHO 1/10, EF 55-60%, mild MR      c.    ECHO  2/11 55-65%       c.    s/p Medtronic BiV ICD with biventricular function now turned        off due to diahragmatic stimulatiob      d.  Right heart catheterization November 05, 2006.  Right atrial        pressure mean of 12, RV pressure 36/8, PA pressure 39/16 with a        mean of 28, wedge pressure was 20.  Fick cardiac output was 5        liters per minute, cardiac index was 2.4.   2. Cardiac cath 01/2006, showed mild nonobstructive CAD  3. COPD GOLD II    - Spirometry July 10, 2008 > FEV1 1.46 56% predicted, ratio of 66%  4. Atrial fibrillation status post AV node ablation       a. not on coumadin due to GIB  5. Iron-deficiency anemia with previous severe GI bleed   6. Hypertension.   7. Hyperlipidemia.   8. Left bundle branch block  9. Diabetes 10.Benign prostatic hypertrophy 09/11/1997 11.Pneumonia, hx of 2-3 times as child 12.Chronic dyspnea 13. Obesity 14. Aborted VF arrest  (shocked by ICD) November 2009  Social History: Smoking Status:  current Packs/Day:  1.0  Review of Systems       As per HPI and past medical history; otherwise all systems negative.   Vital Signs:  Patient profile:   77 year old male Height:      64.5 inches Weight:      191 pounds BMI:     32.40 BP sitting:   102 / 60  (right arm) Cuff size:   regular  Vitals Entered By: Mercer Pod (May 30, 2009 10:26 AM)  Physical Exam  General:  The patient was alert and oriented in no acute distress. HEENT Normal.  Neck veins were flat, carotids were brisk.  Lungs were clear.  Heart sounds were regular without murmurs or gallops. ICD site looks fine.  Abdomen was soft with active bowel sounds. There is no clubbing cyanosis or edema. Skin Warm and dry Neuro: aletr and oriented. cranial nerves 2-12 intact.  moves all 4 extemities without difficulty affect pleasant     ICD Specifications Following MD:  Sherryl Manges, MD     Referring MD:  [facility] ICD Vendor:  Medtronic     ICD Model Number:  [id number]     ICD Serial Number:  [id number] ICD DOI:  04/19/2009     ICD Implanting MD:  Sherryl Manges, MD  Lead 1:    Location: RA     DOI: 08/24/2006     Model #: 8469     Serial #: [id number]     Status: active Lead 2:    Location: RV     DOI: 08/24/2006     Model #: 4401     Serial #: [id number]     Status: active Lead 3:    Location: LV     DOI: 08/24/2006     Model #: 4034     Serial #: [id number]     Status: active  Indications::  NICM, CHF  Explantation Comments: 04/19/2009 Medtronic Sentry [id number] H explanted  ICD Follow Up ICD Dependent:  Yes       ICD Device Measurements Configuration: LV TIP TO RV COIL  Episodes Coumadin:  No  Brady Parameters Mode VVIR     Lower Rate Limit:  70     Upper Rate Limit 120  Tachy Zones VF:  200     VT:  OFF     VT1:  171     Impression & Recommendations:  Problem # 1:  SYSTOLIC HEART FAILURE, CHRONIC (ICD-428.22) EF  has recovered. Volume  status looks good. Continue current meds. Check labs while to make sure renal functional and potassium stable. Advised him not to wear suspenders directly over ICD  to minimize abrasion.   Problem # 2:  ATRIAL FIBRILLATION (ICD-427.31) S/p AV node ablation. Unable to tolerate coumadin due to GI bleeding. Continue ASA.  Orders: EKG w/ Interpretation (93000)  Problem # 3:  TOBACCO ABUSE (ICD-305.1) Counseled on need to quit.  Patient Instructions: 1)  Your physician recommends that you schedule a follow-up appointment in: 6 months 2)  Your physician recommends that you continue on your current medications as directed. Please refer to the Current Medication list given to you today.

## 2010-05-15 NOTE — Progress Notes (Signed)
Summary: regarding drug interaction  Phone Note From Pharmacy   Caller: [facility] Summary of Call: Form is on your desk regarding interaction between klor con and eplerenone.                Lowella Petties CMA, AAMA  May 09, 2010 4:58 PM

## 2010-05-15 NOTE — Progress Notes (Signed)
Summary: MEDICATION   Phone Note Call from Patient Call back at [phone]   Caller: DAUGHTER ([name]) Call For: BENSIMHON Summary of Call: PTS MED LIST WHEN HE LEFT HIS APPT THE OTHER DAY STATED THAT HE IS ON PREDNISONE AND HE IS NOT-IS THIS SOMETHING THAT HE SHOULD BE ON? Initial call taken by: Harlon Flor,  January 02, 2010 8:12 AM  Follow-up for Phone Call        Prednisone had been prescribed by Dr Hetty Ely in april for 7 days but had not been taken off his med list.  Follow-up by: Benedict Needy, RN,  January 02, 2010 8:49 AM

## 2010-05-15 NOTE — Procedures (Signed)
Summary: Cardiology Device [facility]      Allergies Added: NKDA  Current Medications (verified): 1)  Spiriva Handihaler 18 Mcg Caps (Tiotropium Bromide Monohydrate) .... One Inhale Every Morning 2)  Flomax 0.4 Mg Cp24 (Tamsulosin Hcl) .Marland Kitchen.. 1 Daily By Mouth 3)  Protonix 40 Mg Tbec (Pantoprazole Sodium) .... Take 1 Tablet By Mouth Once A Day 4)  Lasix 80 Mg Tabs (Furosemide) .... 1/2 Tablet By Mouth in Am 5)  Coreg 12.5 Mg Tabs (Carvedilol) .Marland Kitchen.. 1 By Mouth Two Times A Day 6)  Iron 325 (65 Fe) Mg Tabs (Ferrous Sulfate) .... 2 in The Morning and 2 in The Evening 7)  Amaryl 4 Mg Tabs (Glimepiride) .... One Tab By Mouth Qd 8)  Accu-Chek Aviva   Strp (Glucose Blood) .... Use 1 Daily and As Needed For Diabeties 9)  Adprin B 325 Mg Tabs (Aspirin Buf(Cacarb-Mgcarb-Mgo)) .Marland Kitchen.. 1 Daily By Mouth 10)  Colchicine 0.6 Mg Tabs (Colchicine) .Marland Kitchen.. 1 Twice A Day As Needed For Gout Pain 11)  Norvasc 2.5 Mg Tabs (Amlodipine Besylate) .Marland Kitchen.. 1 By Mouth Daily 12)  Eplerenone 25 Mg Tabs (Eplerenone) .Marland Kitchen.. 1 By Mouth Daily 13)  Lisinopril 20 Mg Tabs (Lisinopril) .... 1/2  Tablet Two Times A Day 14)  Lipitor 20 Mg Tabs (Atorvastatin Calcium) .Marland Kitchen.. 1 Daily By Mouth At Bedtime 15)  Klor-Con M20 20 Meq Cr-Tabs (Potassium Chloride Crys Cr) .Marland Kitchen.. 1 Tablet Twice A Day By Mouth 16)  Avodart 0.5 Mg Caps (Dutasteride) .... One By Mouth Daily 17)  Vitamin B-12 2500 Mcg Subl (Cyanocobalamin) .... Take One By Mouth Daily  Allergies (verified): No Known Drug Allergies   ICD Specifications Following MD:  Sherryl Manges, MD     Referring MD:  [facility] ICD Vendor:  Medtronic     ICD Model Number:  [id number]     ICD Serial Number:  [id number] ICD DOI:  04/19/2009     ICD Implanting MD:  Sherryl Manges, MD  Lead 1:    Location: RA     DOI: 08/24/2006     Model #: 8657     Serial #: [id number]     Status: active Lead 2:    Location: RV     DOI: 08/24/2006     Model #: 4132     Serial #: [id number]     Status: active Lead 3:    Location: LV      DOI: 08/24/2006     Model #: 3664     Serial #: [id number]     Status: active  Indications::  NICM, CHF  Explantation Comments: 04/19/2009 Medtronic Sentry [id number] H explanted  ICD Follow Up Remote Check?  No Battery Voltage:  3.19 V     Underlying rhythm:  AFIB WITH CHB ICD Dependent:  Yes       ICD Device Measurements Atrium:  Amplitude: 0.6 mV, Impedance: 475 ohms,  Right Ventricle:  Impedance: 589 ohms, Threshold: 0.75 V at 0.4 msec Left Ventricle:  Impedance: 475 ohms, Threshold: 1.5 V at 1.5 msec Configuration: LV TIP TO RV COIL Shock Impedance: 38/47 ohms   Episodes MS Episodes:  1     Percent Mode Switch:  100%     Coumadin:  No Shock:  0     ATP:  0     Nonsustained:  0     Atrial Pacing:  0%     Ventricular Pacing:  98.9%  Brady Parameters Mode VVIR     Lower Rate Limit:  70  Upper Rate Limit 120  Tachy Zones VF:  200     VT:  OFF     VT1:  171     Next Cardiology Appt Due:  07/12/2009 Tech Comments:  Wound check appt today.  Steri-strips removed.  Wound without redness or edema.  Normal device function.  Pt c/o diaphragmatic stim.  LV autocapture turned off.  Output set at 2V at 1.76msec with resolution of diaphragmatic stim. Rate response adjusted by increasing ADL and Exertion responses to 4 instead of 3 to more closely mimic the way his old device was programmed.  Pt c/o lack of energy since this device implanted.  No other changes made today.  Pt has known afib s/p AVN ablation, no Coumadin secondary to GI bleeding.  ROV 3 months [facility] [place] office. Gypsy Balsam RN BSN  April 29, 2009 11:20 AM

## 2010-05-15 NOTE — Miscellaneous (Signed)
Summary: Device change out  Clinical Lists Changes  Observations: Added new observation of ICD IMPL DTE: 04/19/2009 (04/23/2009 10:05) Added new observation of ICD SERL#: [id number] H (04/23/2009 10:05) Added new observation of ICD MODL#: [id number] (04/23/2009 91:47) Added new observation of ICDEXPLCOMM: 04/19/2009 Medtronic Sentry [id number] H explanted (04/23/2009 10:05)       ICD Specifications Following MD:  Sherryl Manges, MD     ICD Vendor:  Medtronic     ICD Model Number:  [id number]     ICD Serial Number:  [id number] ICD DOI:  04/19/2009     ICD Implanting MD:  Sherryl Manges, MD  Lead 1:    Location: RA     DOI: 08/24/2006     Model #: 8413     Serial #: [id number]     Status: active Lead 2:    Location: RV     DOI: 08/24/2006     Model #: 3664     Serial #: [id number]     Status: active Lead 3:    Location: LV     DOI: 08/24/2006     Model #: 5638     Serial #: [id number]     Status: active  Indications::  NICM, CHF  Explantation Comments: 04/19/2009 Medtronic Sentry [id number] H explanted  ICD Follow Up ICD Dependent:  Yes      Episodes Coumadin:  No  Brady Parameters Mode VVIR     Lower Rate Limit:  70     Upper Rate Limit 120  Tachy Zones VF:  200     VT:  250     VT1:  171

## 2010-05-15 NOTE — Assessment & Plan Note (Signed)
Summary: weak,numbness legs/arms  Medications Added VITAMIN D3 2000 UNIT CAPS (CHOLECALCIFEROL) 1 by mouth daily      Allergies Added: NKDA  Referring [name]:  Dr. Marikay Diaz Primary [name]:  Johnny Leeks MD   History of Present Illness: [name] is a 77 year old male with a history of a congestive heart failure secondary to nonischemic cardiac myopathy with a recovered ejection fraction he is also has h/o chronic atrial fib status post AV node ablation and  BIV ICD implantation. Remainder of medical history is notable for COPD, diabetes, hypertension and hyperlipidemia. He returns today for routine followup.  In November 2010, he had syncopal episode and ICD showed VF with appropriate therapy.   Underwent ICD change out in Jan 2011 for ERI without problem. Had echo Feb 2011 ef 55-65%. Had some soreness over site but no drainage or skin breakdown.   Here for f/u. Says he feels very weak. About a weak ago had a pop in neck and L arm was sore and weak. Saw PCP and was treated with prednisone. Now improved. However he feels weak all over and tired. No CP. Weight up a few pounds with prednisone. No fevers of chills. No bleeding. Has black stools due to iron. No liquid stools or blood.  No incontinence. Had bloodwork 2 weeks ago mild leukocytosis (11.9k with normal diff) otherwise normal.   Current Medications (verified): 1)  Spiriva Handihaler 18 Mcg Caps (Tiotropium Bromide Monohydrate) .... One Inhale Every Morning 2)  Flomax 0.4 Mg Cp24 (Tamsulosin Hcl) .Marland Kitchen.. 1 Daily By Mouth 3)  Protonix 40 Mg Tbec (Pantoprazole Sodium) .... Take 1 Tablet By Mouth Once A Day 4)  Furosemide 40 Mg Tabs (Furosemide) .... Take One Tablet By Mouth Daily. 5)  Coreg 12.5 Mg Tabs (Carvedilol) .Marland Kitchen.. 1 By Mouth Two Times A Day 6)  Iron 325 (65 Fe) Mg Tabs (Ferrous Sulfate) .... 2 in The Morning and 2 in The Evening 7)  Amaryl 4 Mg Tabs (Glimepiride) .... One Tab By Mouth Daily 8)  Accu-Chek Aviva    Strp (Glucose Blood) .... Use 1 Daily and As Needed For Diabeties 9)  Adprin B 325 Mg Tabs (Aspirin Buf(Cacarb-Mgcarb-Mgo)) .Marland Kitchen.. 1 Daily By Mouth 10)  Colchicine 0.6 Mg Tabs (Colchicine) .Marland Kitchen.. 1 Twice A Day As Needed For Gout Pain 11)  Norvasc 2.5 Mg Tabs (Amlodipine Besylate) .Marland Kitchen.. 1 By Mouth Daily 12)  Eplerenone 25 Mg Tabs (Eplerenone) .Marland Kitchen.. 1 By Mouth Daily 13)  Lisinopril 20 Mg Tabs (Lisinopril) .... 1/2  Tablet Two Times A Day 14)  Lipitor 20 Mg Tabs (Atorvastatin Calcium) .Marland Kitchen.. 1 Daily By Mouth At Bedtime 15)  Klor-Con M20 20 Meq Cr-Tabs (Potassium Chloride Crys Cr) .Marland Kitchen.. 1 Tablet Twice A Day By Mouth 16)  Avodart 0.5 Mg Caps (Dutasteride) .... One By Mouth Daily 17)  Vitamin B-12 2500 Mcg Subl (Cyanocobalamin) .... Take One By Mouth Daily 18)  Vitamin D3 2000 Unit Caps (Cholecalciferol) .Marland Kitchen.. 1 By Mouth Daily  Allergies (verified): No Known Drug Allergies  Past History:  Past Medical History: Last updated: 05/30/2009  1. CHF secondary to nonischemic  cardiomyopathy      a.    ECHO 10/07, EF 20-25%, moderate to severe MR      b.    ECHO 1/10, EF 55-60%, mild MR      c.    ECHO  2/11 55-65%       c.    s/p Medtronic BiV ICD with biventricular function now turned  off due to diahragmatic stimulatiob      d.     Right heart catheterization November 05, 2006.  Right atrial        pressure mean of 12, RV pressure 36/8, PA pressure 39/16 with a        mean of 28, wedge pressure was 20.  Fick cardiac output was 5        liters per minute, cardiac index was 2.4.   2. Cardiac cath 01/2006, showed mild nonobstructive CAD  3. COPD GOLD II    - Spirometry July 10, 2008 > FEV1 1.46 56% predicted, ratio of 66%  4. Atrial fibrillation status post AV node ablation       a. not on coumadin due to GIB  5. Iron-deficiency anemia with previous severe GI bleed   6. Hypertension.   7. Hyperlipidemia.   8. Left bundle branch block  9. Diabetes 10.Benign prostatic hypertrophy  09/11/1997 11.Pneumonia, hx of 2-3 times as child 12.Chronic dyspnea 13. Obesity 14. Aborted VF arrest (shocked by ICD) November 2009  Vital Signs:  Patient profile:   77 year old male Height:      64.5 inches Weight:      194 pounds Pulse rate:   80 / minute Resp:     18 per minute BP sitting:   124 / 70  (right arm)  Vitals Entered By: Johnny Coy, CNA (February 06, 2010 1:53 PM)  Physical Exam  General:  Fatigued  The patient was alert and oriented in no acute distress. HEENT Normal.  Neck veins were flat, carotids were brisk.  Lungs were clear.  Heart sounds were distant regular without murmurs or gallops. ICD site looks fine.  Abdomen was soft with active bowel sounds. There is no clubbing cyanosis. tr edema.  Skin Warm and dry Neuro: aletr and oriented. cranial nerves 2-12 intact.  Weak in both shoulders R > L. Hand strength ok. No drift. Leg strength ok. affect pleasant     ICD Specifications Following MD:  Johnny Manges, MD     Referring MD:  [name] ICD Vendor:  Medtronic     ICD Model Number:  [id number]     ICD Serial Number:  [id number] ICD DOI:  04/19/2009     ICD Implanting MD:  Johnny Manges, MD  Lead 1:    Location: RA     DOI: 08/24/2006     Model #: [id number]     Serial #: [id number]     Status: active Lead 2:    Location: RV     DOI: 08/24/2006     Model #: [id number]     Serial #: [id number]     Status: active Lead 3:    Location: LV     DOI: 08/24/2006     Model #: [id number]     Serial #: [id number]     Status: active  Indications::  NICM, CHF  Explantation Comments: 04/19/2009 Medtronic Sentry [id number] H explanted  ICD Follow Up ICD Dependent:  Yes       ICD Device Measurements Configuration: LV TIP TO RV COIL  Episodes Coumadin:  No  Brady Parameters Mode VVIR     Lower Rate Limit:  70     Upper Rate Limit 120  Tachy Zones VF:  200     VT:  OFF     VT1:  171     Impression & Recommendations:  Problem # 1:  WEAKNESS No obvious acute cardiac  abnormality. By history,  I agree with Dr. Ermalene Diaz that he may have ruptured disk in his neck. Given ongoing symptoms we will take the liberty to refer him to neurosurgery and make sure no evidence of cord compression or other surgical issue.   Problem # 2:  ATRIAL FIBRILLATION S/P AV ABLATION (ICD-427.31) Chronic. Will have Medtronic rep interrogate device to ensure no underlying arrhythmias.  Patient Instructions: 1)  You have been referred to Neurosurgery--Dr Yetta Barre on 11/21 at 9:45am 2)  Your physician wants you to follow-up in: 6 months.  You will receive a reminder letter in the mail two months in advance. If you don't receive a letter, please call our office to schedule the follow-up appointment.

## 2010-05-15 NOTE — Progress Notes (Signed)
Summary: forms for diabetic supplies  Phone Note From Pharmacy   Caller: [facility] Summary of Call: Faxed form for diabetic supplies and lumbar support are on your desk.  I spoke with the patient and he does want these supplies. Initial call taken by: Lowella Petties CMA, AAMA,  March 27, 2010 11:18 AM

## 2010-05-15 NOTE — Assessment & Plan Note (Signed)
Summary: 1 m f/u 30 min p er md/dlo   Vital Signs:  Patient profile:   77 year old male Height:      64.5 inches Weight:      189 pounds BMI:     32.06 Temp:     97.7 degrees F oral Pulse rate:   72 / minute Pulse rhythm:   regular BP sitting:   120 / 70  (left arm) Cuff size:   regular  Vitals Entered By: Benny Lennert CMA Duncan Dull) (May 02, 2010 3:59 PM)  History of Present Illness: Chief complaint 1 month follow up  In last 6 months: he has had 2 neck surgeries.Marland Kitchen on C4... had second syurgery due to complication of temporary paralysis due to blood clot  in neck.  MD..Dr. Yetta Barre. Paralysis improved greatly.. no able to walk, but still wobbly, some weakness but may not return to normal.  Wearing neck brace. Not taking any pain med or muscle relaxant.  Strength improving day to day... using a cane and a walker.  At last OV.Marland Kitchen low BPs.Marland KitchenMarland KitchenHeld norvasc continue lisinopril and coreg.  when saw Dr. Graciela Husbands on 1/4.. he halved all BP meds.   Since then no low BPS.Marland Kitchen no dizziness.   Recent foot fracture: Treated by Dr. Patsy Lager. Not wearing Cam Wlaker as indicated. Not wearing ankle brace now either sue to foot soreness.  Had fallen using walker... lost balance getting out of bed.  No syncopal episode.   Seeing Dr. Graciela Husbands for nonischemic cardiomyopathy, status post AV junction ablation, and CRTD implantation.  Last OV 04/2010 In November 2010, he had syncopal episode and ICD showed VF with appropriate therapy.    Problems Prior to Update: 1)  Other Closed Fracture of Tarsal&metatarsal Bones  (ICD-825.29) 2)  Fracture, Toe  (ICD-826.0) 3)  Closed Fracture of Lateral Malleolus  (ICD-824.2) 4)  Closed Fracture of Cuboid Bone  (ICD-825.23) 5)  Foot Pain, Right  (ICD-729.5) 6)  Ankle Pain, Right  (ICD-719.47) 7)  Ankle Pain, Left  (ICD-719.47) 8)  Foot Pain, Left  (ICD-729.5) 9)  Constipation, Chronic  (ICD-564.09) 10)  ? 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)  (ICD-755.66) 21)  Mitral Regurgitation/ Mod-severe  (ICD-396.3) 22)  Aftercare, Long-term Use, Medications Nec  (ICD-V58.69) 23)  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) 24)  Cardiomyopathy, Primary, Resolved  (ICD-425.4) 25)  Pneumonia, Hx of  (ICD-V12.60) 26)  Pulmonary Nodule, Right Lower Lobe  (ICD-518.89) 27)  Atrophy, 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 One Inhale Every Morning 2)  Flomax 0.4 Mg Cp24 (Tamsulosin Hcl) .Marland Kitchen.. 1 Daily By Mouth 3)  Protonix 40 Mg Tbec (Pantoprazole Sodium) .... Take 1 Tablet By Mouth Once A Day  4)  Furosemide 40 Mg Tabs (Furosemide) .... Take One Tablet By Mouth Daily. 5)  Coreg 12.5 Mg Tabs (Carvedilol) .... Take 1/2 Tablet By Mouth Two Times A Day 6)  Iron 325 (65 Fe) Mg Tabs (Ferrous Sulfate) .... 2 in The Morning and 2 in The Evening 7)  Amaryl 4 Mg Tabs (Glimepiride) .... One Tab By Mouth Daily 8)  Accu-Chek Aviva   Strp (Glucose Blood) .... Use 1 Daily and As Needed For Diabeties 9)  Adprin B 325 Mg Tabs (Aspirin Buf(Cacarb-Mgcarb-Mgo)) .Marland Kitchen.. 1 Daily By Mouth 10)  Colcrys 0.6 Mg Tabs (Colchicine) .Marland Kitchen..  1 Tab By Mouth Two Times A Day As Needed Gout Pain 11)  Eplerenone 25 Mg Tabs (Eplerenone) .... Take 1/2 By Mouth Daily 12)  Lisinopril 20 Mg Tabs (Lisinopril) .... 1/2  Tablet Once Daily. 13)  Lipitor 20 Mg Tabs (Atorvastatin Calcium) .Marland Kitchen.. 1 Daily By Mouth At Bedtime 14)  Klor-Con M20 20 Meq Cr-Tabs (Potassium Chloride Crys Cr) .Marland Kitchen.. 1 Tablet Twice A Day By Mouth 15)  Avodart 0.5 Mg Caps (Dutasteride) .... One By Mouth Daily 16)  Vitamin B-12 2500 Mcg Subl (Cyanocobalamin) .... Take One By Mouth Daily 17)  Vitamin D3 2000 Unit Caps (Cholecalciferol) .Marland Kitchen.. 1 By Mouth Daily 18)  Gabapentin 100 Mg Caps (Gabapentin) .Marland Kitchen.. 1 Tab By Mouth At Bedtime For Peripharal Neuropathy. 19)  Wheelchair, Standard Rolling .... Length of Need = 99 Years Salome Holmes) Dx Codes: 825.23, 824.2, 826.0, V45.02  Allergies (verified): No Known Drug Allergies  Past History:  Past medical, surgical, family and social histories (including risk factors) reviewed, and no changes noted (except as noted below).  Past Medical History: Reviewed history from 05/30/2009 and no changes required.  1. CHF secondary to nonischemic  cardiomyopathy      a.    ECHO 10/07, EF 20-25%, moderate to severe MR      b.    ECHO 1/10, EF 55-60%, mild MR      c.    ECHO  2/11 55-65%       c.    s/p Medtronic BiV ICD with biventricular function now turned        off due to diahragmatic stimulatiob      d.     Right heart catheterization November 05, 2006.  Right atrial        pressure mean of 12, RV pressure 36/8, PA pressure 39/16 with a        mean of 28, wedge pressure was 20.  Fick cardiac output was 5        liters per minute, cardiac index was 2.4.   2. Cardiac cath 01/2006, showed mild nonobstructive CAD  3. COPD GOLD II    - Spirometry July 10, 2008 > FEV1 1.46 56% predicted, ratio of 66%  4. Atrial fibrillation status post AV node ablation       a. not on coumadin due to GIB  5. Iron-deficiency anemia with previous severe GI bleed   6.  Hypertension.   7. Hyperlipidemia.   8. Left bundle branch block  9. Diabetes 10.Benign prostatic hypertrophy 09/11/1997 11.Pneumonia, hx of 2-3 times as child 12.Chronic dyspnea 13. Obesity 14. Aborted VF arrest (shocked by ICD) November 2009  Past Surgical History: Reviewed history from 06/20/2008 and no changes required. Lung-lobectomy RLL 01/21/1998 EGD prepyloric ulcer, esoph ring, duod avm 09/1997 Colonoscopy polypectomy, divertics, int hemms 11/1997 [facility] GI bleed 02/07-02/12/2002 EGD poss Barrett's 05/22/2002 Colonoscopy polyp,multip(neg) divertics,int hemm 06/26/2002 Colonoscopy polyps, divetics,  int hemms 07/10/2004 [facility] SOB, AFIB, CHF 10/11-10/18/2007 Cath min obstruct dz severe LV dysftn EF 25% [facility] acute blood loss, anemia, sys HF, isch cardiomyopathy 11/08-11/03/2006 EGD HH No active bleeding 02/19/2006 Rotator cuff repair 1994 and 1995 L knee surgery 1996 CT Head   No acute abnmlty  10/03/2006 R heart Cath nml (Dr Bensihmon) 11/05/2006 [facility]OSP Acute on Chronis CHF IIIB Nonisch Cardiomyop EF 20-25% Mod-Sev MR 8/14-8/23/08 Colonoscopy 2 Polyps Divertics Int Hemms (Dr Marina Goodell) 06/19/08    Family History: Reviewed history from 07/31/2009 and no changes required. Father dec 77 CHF (Smoker) Mother dec 81 CHF Brother A 41 Allyne Gee)  Lung Ca Brother A 67 (Burnie) Liver ETOH COPD (disability) Brother A 59 (Doug) Nerves (disability) COPD Brother A 29 Peyton Najjar) Sister A 29 HTN Sister A 66 HTN Obesity  Social History: Reviewed history from 07/10/2008 and no changes required. Occupation: [employer] Retired Married lives w/ wife 1 adopted Current smoker.  Smoker since age 36.  Quit for 2 years and started back in Dec 2009.  Smokes 1 ppd. Alcohol use-yes Drug use-no  Review of Systems General:  Denies fatigue and fever. CV:  Denies chest pain or discomfort. Resp:  Denies shortness of breath.  Physical Exam  General:  elderly male in NAd  Mouth:  MMM Neck:  no carotid  bruit or thyromegaly no cervical or supraclavicular lymphadenopathy  Lungs:  normal respiratory effort.   Heart:  Normal rate and regular rhythm. S1 and S2 normal without gallop, click, rub or other extra sounds. II-III/VI sys murmur precordially, heard best at upper right  sternal border. Abdomen:  Bowel sounds positive,abdomen soft and non-tender without masses, organomegaly or hernias noted. Pulses:  R and L posterior tibial pulses are full and equal bilaterally  Extremities:  no edema.Marland Kitchen welling from oot/ankle fracture improved.   Impression & Recommendations:  Problem # 1:  HYPOTENSION, IATROGENIC (ICD-458.9) Resolved on lower doses of all BP meds and off norvasc.   Complete Medication List: 1)  Spiriva Handihaler 18 Mcg Caps (Tiotropium bromide monohydrate) .... One inhale every morning 2)  Flomax 0.4 Mg Cp24 (Tamsulosin hcl) .Marland Kitchen.. 1 daily by mouth 3)  Protonix 40 Mg Tbec (Pantoprazole sodium) .... Take 1 tablet by mouth once a day 4)  Furosemide 40 Mg Tabs (Furosemide) .... Take one tablet by mouth daily. 5)  Coreg 12.5 Mg Tabs (Carvedilol) .... Take 1/2 tablet by mouth two times a day 6)  Iron 325 (65 Fe) Mg Tabs (Ferrous sulfate) .... 2 in the morning and 2 in the evening 7)  Amaryl 4 Mg Tabs (Glimepiride) .... One tab by mouth daily 8)  Accu-chek Aviva Strp (Glucose blood) .... Use 1 daily and as needed for diabeties 9)  Adprin B 325 Mg Tabs (Aspirin buf(cacarb-mgcarb-mgo)) .Marland Kitchen.. 1 daily by mouth 10)  Colcrys 0.6 Mg Tabs (Colchicine) .Marland Kitchen.. 1 tab by mouth two times a day as needed gout pain 11)  Eplerenone 25 Mg Tabs (Eplerenone) .... Take 1/2 by mouth daily 12)  Lisinopril 20 Mg Tabs (Lisinopril) .... 1/2  tablet once daily. 13)  Lipitor 20 Mg Tabs (Atorvastatin calcium) .Marland Kitchen.. 1 daily by mouth at bedtime 14)  Klor-con M20 20 Meq Cr-tabs (Potassium chloride crys cr) .Marland Kitchen.. 1 tablet twice a day by mouth 15)  Avodart 0.5 Mg Caps (Dutasteride) .... One by mouth daily 16)  Vitamin B-12  2500 Mcg Subl (Cyanocobalamin) .... Take one by mouth daily 17)  Vitamin D3 2000 Unit Caps (Cholecalciferol) .Marland Kitchen.. 1 by mouth daily 18)  Gabapentin 100 Mg Caps (Gabapentin) .Marland Kitchen.. 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 19)  Wheelchair, Standard Rolling  .... Length of need = 99 years (perm) dx codes: 825.23, 824.2, 826.0, v45.02  Patient Instructions: 1)  Schedule annual medicare wellness after 07/13/2010. Prescriptions: GABAPENTIN 100 MG CAPS (GABAPENTIN) 1 tab by mouth at bedtime for peripharal neuropathy.  #90 x 3   Entered and Authorized by:   Kerby Nora MD   Signed by:   Kerby Nora MD on 05/02/2010   Method used:   Faxed to ...       [facility] (mail[facility]order)       [address] [place], [place]  [address]       Ph: [phone]       Fax: [phone]   RxID:   [id number] AVODART 0.5 MG CAPS (DUTASTERIDE) one by mouth daily  #90 x 3   Entered and Authorized by:   Kerby Nora MD   Signed by:   Kerby Nora MD on 05/02/2010   Method used:   Faxed to ...       [facility] (mail-order)       [address] [place], [place]  [address]       Ph: [phone]       Fax: [phone]   RxID:   [id number] KLOR-CON M20 20 MEQ CR-TABS (POTASSIUM CHLORIDE CRYS CR) 1 tablet twice a day by mouth  #180 x 3   Entered and Authorized by:   Kerby Nora MD   Signed by:   Kerby Nora MD on 05/02/2010   Method used:   Faxed to ...       [facility] (mail-order)       [address] [place], [place]  [address]       Ph: [phone]       Fax: [phone]   RxID:   [id number] LIPITOR 20 MG TABS (ATORVASTATIN CALCIUM) 1 daily by mouth at bedtime  #90 Tablet x 3   Entered and Authorized by:   Kerby Nora MD   Signed by:   Kerby Nora MD on 05/02/2010   Method used:   Faxed to ...       [facility] (mail-order)       [address] [place], [place]  [address]       Ph: [phone]       Fax: [phone]   RxID:   [id number] COLCRYS 0.6 MG TABS (COLCHICINE)  1 tab by mouth two times a day as needed gout pain  #180 x 3   Entered and Authorized by:   Kerby Nora MD   Signed by:   Kerby Nora MD on 05/02/2010   Method used:   Faxed to ...       [facility] (mail-order)       [address] [place], [place]  [address]       Ph: [phone]       Fax: [phone]   RxID:   [id number] LISINOPRIL 20 MG TABS (LISINOPRIL) 1/2  tablet once daily.  #45 x 3   Entered and Authorized by:   Kerby Nora MD   Signed by:   Kerby Nora MD on 05/02/2010   Method used:   Faxed to ...       [facility] (  [facility]       [address] [place], [place]  [address]       Ph: [phone]       Fax: [phone]   RxID:   [id number] EPLERENONE 25 MG TABS (EPLERENONE) Take 1/2 by mouth daily  #45 x 3   Entered and Authorized by:   Kerby Nora MD   Signed by:   Kerby Nora MD on 05/02/2010   Method used:   Faxed to ...       [facility] (mail-order)       [address] [place], [place]  [address]       Ph: [phone]       Fax: [phone]   RxID:   [id number] ACCU-CHEK AVIVA   STRP (GLUCOSE BLOOD) USE 1 DAILY AND AS NEEDED FOR DIABETIES  #100 x 3   Entered and Authorized by:   Kerby Nora MD   Signed by:   Kerby Nora MD on 05/02/2010   Method used:   Faxed to ...       [facility] (mail-order)       [address] [place], [place]  [address]       Ph: [phone]       Fax: [phone]   RxID:   [id number] AMARYL 4 MG TABS (GLIMEPIRIDE) ONE TAB by mouth daily  #90 x 3   Entered and Authorized by:   Kerby Nora MD   Signed by:   Kerby Nora MD on 05/02/2010   Method used:   Faxed to ...       [facility] (mail-order)       [address] [place], [place]  [address]       Ph: [phone]       Fax: [phone]   RxID:   [id number] COREG 12.5 MG TABS (CARVEDILOL) Take 1/2 tablet by mouth two times a day  #45 x 3   Entered and Authorized by:   Kerby Nora MD   Signed by:   Kerby Nora MD on 05/02/2010   Method  used:   Faxed to ...       [facility] (mail-order)       [address] [place], [place]  [address]       Ph: [phone]       Fax: [phone]   RxID:   [id number] FUROSEMIDE 40 MG TABS (FUROSEMIDE) Take one tablet by mouth daily.  #90 x 3   Entered and Authorized by:   Kerby Nora MD   Signed by:   Kerby Nora MD on 05/02/2010   Method used:   Faxed to ...       [facility] (mail-order)       [address] [place], [place]  [address]       Ph: [phone]       Fax: [phone]   RxID:   [id number] PROTONIX 40 MG TBEC (PANTOPRAZOLE SODIUM) Take 1 tablet by mouth once a day  #90 Tablet x 3   Entered and Authorized by:   Kerby Nora MD   Signed by:   Kerby Nora MD on 05/02/2010   Method used:   Faxed to ...       [facility] (mail-order)       [phone]  [address] [place], [place]  [address]       Ph: [phone]       Fax: [phone]   RxID:   [id number] FLOMAX 0.4 MG CP24 (TAMSULOSIN HCL) 1 daily by mouth Brand medically necessary #90 x 3   Entered and Authorized by:   Kerby Nora MD   Signed by:   Kerby Nora MD on 05/02/2010   Method used:   Faxed to ...       [facility] (mail-order)       [address] [place], [place]  [address]       Ph: [phone]       Fax: [phone]   RxID:   [id number] SPIRIVA HANDIHALER 18 MCG CAPS (TIOTROPIUM BROMIDE MONOHYDRATE) ONE INHALE EVERY MORNING  #3 x 4   Entered and Authorized by:   Kerby Nora MD   Signed by:   Kerby Nora MD on 05/02/2010   Method used:   Faxed to ...       [facility] (mail-order)       [address] [place], [place]  [address]       Ph: [phone]       Fax: [phone]   RxID:   [id number]    Orders Added: 1)  Est. Patient Level III [[address]]    Current Allergies (reviewed today): No known allergies

## 2010-05-15 NOTE — Assessment & Plan Note (Signed)
Summary: DF2/AMD    Referring Provider:  Dr. Marikay Alar Primary Provider:  [name] MD   History of Present Illness: [name] is a 77 year old male with a history of a congestive heart failure secondary to nonischemic cardiac myopathy with a recovered ejection fraction he is also has h/o atrial fib status post AV node ablation and  BIV ICD implantation. Remainder of medical history is notable for COPD, diabetes, hypertension and hyperlipidemia. He returns today for routine followup.  In November, he had syncopal episode and ICD showed VF with appropriate therapy.   His ICD has reached ERI   He has had no chest pain, sob or edema  There have been no ICD discharges or tachypalpitations or syncope  Allergies: No Known Drug Allergies  Past History:  Past Medical History: Last updated: 07/10/2008  1. CHF secondary to nonischemic  cardiomyopathy      a.    ECHO 10/07, EF 20-25%, moderate to severe MR      b.    ECHO 1/10, EF 55-60%, mild MR      c.    s/p Medtronic BiV ICD with biventricular function now turned        off due to diahragmatic stimulatiob      d.     Right heart catheterization November 05, 2006.  Right atrial        pressure mean of 12, RV pressure 36/8, PA pressure 39/16 with a        mean of 28, wedge pressure was 20.  Fick cardiac output was 5        liters per minute, cardiac index was 2.4.   2. Cardiac cath 01/2006, showed mild nonobstructive CAD  3. COPD GOLD II    - Spirometry July 10, 2008 > FEV1 1.46 56% predicted, ratio of 66%  4. Atrial fibrillation status post AV node ablation       a. not on coumadin due to GIB  5. Iron-deficiency anemia with previous severe GI bleed   6. Hypertension.   7. Hyperlipidemia.   8. Left bundle branch block  9. Diabetes 10.Benign prostatic hypertrophy 09/11/1997 11.Pneumonia, hx of 2-3 times as child 12.Chronic dyspnea 13. Obesity 14. Aborted VF arrest (shocked by ICD) November 2009  Past Surgical  History: Last updated: 06/20/2008 Lung-lobectomy RLL 01/21/1998 EGD prepyloric ulcer, esoph ring, duod avm 09/1997 Colonoscopy polypectomy, divertics, int hemms 11/1997 [facility] GI bleed 02/07-02/12/2002 EGD poss Barrett's 05/22/2002 Colonoscopy polyp,multip(neg) divertics,int hemm 06/26/2002 Colonoscopy polyps, divetics, int hemms 07/10/2004 [facility] SOB, AFIB, CHF 10/11-10/18/2007 Cath min obstruct dz severe LV dysftn EF 25% [facility] acute blood loss, anemia, sys HF, isch cardiomyopathy 11/08-11/03/2006 EGD HH No active bleeding 02/19/2006 Rotator cuff repair 1994 and 1995 L knee surgery 1996 CT Head   No acute abnmlty  10/03/2006 R heart Cath nml (Dr Bensihmon) 11/05/2006 HOSP Acute on Chronis CHF IIIB Nonisch Cardiomyop EF 20-25% Mod-Sev MR 8/14-8/23/08 Colonoscopy 2 Polyps Divertics Int Hemms (Dr Marina Goodell) 06/19/08    Family History: Last updated: 07/17/2008 Father dec 77 CHF (Smoker) Mother dec 81 CHF Brother A 73 Allyne Gee)  Lung Ca Brother A 66 (Burnie) Liver ETOH COPD (disability) Brother A 58 (Doug) Nerves (disability) Brother A 41 Peyton Najjar) Sister A HTN Sister A HTN  Physical Exam  General:  The patient was alert and oriented in no acute distress. HEENT Normal.  Neck veins were flat, carotids were brisk.  Lungs were clear.  Heart sounds were regular without murmurs or gallops.  Abdomen was  soft with active bowel sounds. There is no clubbing cyanosis or edema. Skin Warm and dry     ICD Specifications Following MD:  Sherryl Manges, MD     ICD Vendor:  Medtronic     ICD Model Number:  7299     ICD Serial Number:  [id number] ICD DOI:  08/24/2006     ICD Implanting MD:  Sherryl Manges, MD  Lead 1:    Location: RA     DOI: 08/24/2006     Model #: 6962     Serial #: [id number]     Status: active Lead 2:    Location: RV     DOI: 08/24/2006     Model #: [id number]     Serial #: [id number]     Status: active Lead 3:    Location: LV     DOI: 08/24/2006     Model #: [id number]     Serial #: [id number]      Status: active  Indications::  NICM, CHF   ICD Follow Up ICD Dependent:  Yes      Episodes Coumadin:  No  Brady Parameters Mode VVIR     Lower Rate Limit:  70     Upper Rate Limit 120  Tachy Zones VF:  200     VT:  250     VT1:  171     Impression & Recommendations:  Problem # 1:  IMPLANTABLE   DEFIBRILLATOR CRT (ICD-V45.02) Device has reached ERI  Device parameters and data were reviewed and no changes were made REveiwed risks and benefits of device generator replacement, including but not limited to infection and lead fracture and death He understands agrees and is willing to proceed  Problem # 2:  SYSTOLIC HEART FAILURE, CHRONIC (ICD-428.22) relatively well compenstaed on his current meds.  He may be a candidate for aldactone based on [facility] His updated medication list for this problem includes:    Lasix 80 Mg Tabs (Furosemide) .Marland Kitchen... 1/2 tablet by mouth in am    Coreg 12.5 Mg Tabs (Carvedilol) .Marland Kitchen... 1 by mouth two times a day    Adprin B 325 Mg Tabs (Aspirin buf(cacarb-mgcarb-mgo)) .Marland Kitchen... 1 daily by mouth    Norvasc 2.5 Mg Tabs (Amlodipine besylate) .Marland Kitchen... 1 by mouth daily    Lisinopril 20 Mg Tabs (Lisinopril) .Marland Kitchen... 1/2  tablet two times a day  Problem # 3:  VENTRICULAR FIBRILLATION (ICD-427.41) no intercurrent arrhythmias His updated medication list for this problem includes:    Coreg 12.5 Mg Tabs (Carvedilol) .Marland Kitchen... 1 by mouth two times a day    Adprin B 325 Mg Tabs (Aspirin buf(cacarb-mgcarb-mgo)) .Marland Kitchen... 1 daily by mouth    Norvasc 2.5 Mg Tabs (Amlodipine besylate) .Marland Kitchen... 1 by mouth daily    Lisinopril 20 Mg Tabs (Lisinopril) .Marland Kitchen... 1/2  tablet two times a day  Problem # 4:  CARDIOMYOPATHY (ICD-425.4) stable His updated medication list for this problem includes:    Lasix 80 Mg Tabs (Furosemide) .Marland Kitchen... 1/2 tablet by mouth in am    Coreg 12.5 Mg Tabs (Carvedilol) .Marland Kitchen... 1 by mouth two times a day    Adprin B 325 Mg Tabs (Aspirin buf(cacarb-mgcarb-mgo)) .Marland Kitchen... 1 daily by  mouth    Norvasc 2.5 Mg Tabs (Amlodipine besylate) .Marland Kitchen... 1 by mouth daily    Lisinopril 20 Mg Tabs (Lisinopril) .Marland Kitchen... 1/2  tablet two times a day  Other Orders: Bi-V ICD (Bi-V ICD) T-Basic Metabolic Panel ([id number]) T-CBC No Diff ([id number]) T-Protime, Auto ([id number])  Patient Instructions: 1)  You have been set up to have your ICD generator changed out on Jan 7 ,2011.  Please refer to paperwork.

## 2010-05-15 NOTE — Cardiovascular Report (Signed)
Summary: Office Visit   Office Visit   Imported By: Roderic Ovens 05/10/2009 15:52:35  _____________________________________________________________________  External Attachment:    Type:   Image     Comment:   External Document

## 2010-05-15 NOTE — Letter (Signed)
Summary: Remote Device Check  [facility] [employer], Main Office  [address]   [place], [place] [address]   Phone: [phone]  Fax: [phone]     November 29, 2009 MRN: [id number]   [name] [address] [place], [place]  [address]   Dear [name],   Your remote transmission was recieved and reviewed by your physician.  All diagnostics were within normal limits for you.  __X___Your next transmission is scheduled for:   02-13-2010.  Please transmit at any time this day.  If you have a wireless device your transmission will be sent automatically.    Sincerely,  Vella Kohler

## 2010-05-15 NOTE — Medication Information (Signed)
Summary: Revised Order for Diabetes Supplies/[facility]  Revised Order for Diabetes Supplies/[facility]   Imported By: Lanelle Bal 04/23/2010 12:57:25  _____________________________________________________________________  External Attachment:    Type:   Image     Comment:   External Document

## 2010-05-15 NOTE — Assessment & Plan Note (Signed)
Summary: F6M/AMD      Allergies Added: NKDA  Visit Type:  Follow-up Referring [name]:  Dr. Marikay Alar Primary [name]:  Johnny Leeks MD  CC:  c/o weakness in legs and arms..  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 Remainder of medical history is notable for COPD, diabetes, hypertension and hyperlipidemia. He returns today for routine followup.  In November 2010, he had syncopal episode and ICD showed VF with appropriate therapy.   Underwent ICD change out in Jan 2011 for ERI without problem. Had echo Feb 2011 ef 55-65%. Had some soreness over site but no drainage or skin breakdown.   Here for f/u. Denies CP or significant SOB. Main complaint is that he feels weak all over. Feels like he felt before when he was anemic.  There have been no ICD discharges or tachypalpitations or syncope. Back to smoking a 1/2  ppd cigarettes and has lost 40+ pounds. Has black stool from iron but no obvious blood. Bad arthritis in neck and back.    Current Medications (verified): 1)  Spiriva Handihaler 18 Mcg Caps (Tiotropium Bromide Monohydrate) .... One Inhale Every Morning 2)  Flomax 0.4 Mg Cp24 (Tamsulosin Hcl) .Marland Kitchen.. 1 Daily By Mouth 3)  Protonix 40 Mg Tbec (Pantoprazole Sodium) .... Take 1 Tablet By Mouth Once A Day 4)  Furosemide 40 Mg Tabs (Furosemide) .... Take One Tablet By Mouth Daily. 5)  Coreg 12.5 Mg Tabs (Carvedilol) .Marland Kitchen.. 1 By Mouth Two Times A Day 6)  Iron 325 (65 Fe) Mg Tabs (Ferrous Sulfate) .... 2 in The Morning and 2 in The Evening 7)  Amaryl 4 Mg Tabs (Glimepiride) .... One Tab By Mouth Daily 8)  Accu-Chek Aviva   Strp (Glucose Blood) .... Use 1 Daily and As Needed For Diabeties 9)  Adprin B 325 Mg Tabs (Aspirin Buf(Cacarb-Mgcarb-Mgo)) .Marland Kitchen.. 1 Daily  By Mouth 10)  Colchicine 0.6 Mg Tabs (Colchicine) .Marland Kitchen.. 1 Twice A Day As Needed For Gout Pain 11)  Norvasc 2.5 Mg Tabs (Amlodipine Besylate) .Marland Kitchen.. 1 By Mouth Daily 12)  Eplerenone 25 Mg Tabs (Eplerenone) .Marland Kitchen.. 1 By Mouth Daily 13)  Lisinopril 20 Mg Tabs (Lisinopril) .... 1/2  Tablet Two Times A Day 14)  Lipitor 20 Mg Tabs (Atorvastatin Calcium) .Marland Kitchen.. 1 Daily By Mouth At Bedtime 15)  Klor-Con M20 20 Meq Cr-Tabs (Potassium Chloride Crys Cr) .Marland Kitchen.. 1 Tablet Twice A Day By Mouth 16)  Avodart 0.5 Mg Caps (Dutasteride) .... One By Mouth Daily 17)  Vitamin B-12 2500 Mcg Subl (Cyanocobalamin) .... Take One By Mouth Daily 18)  Prednisone 50 Mg Tabs (Prednisone) .... One Tab By Mouth in Am  Allergies (verified): No Known Drug Allergies  Past History:  Past Medical History: Last updated: 05/30/2009  1. CHF secondary to nonischemic  cardiomyopathy      a.    ECHO 10/07, EF 20-25%, moderate to severe MR      b.    ECHO 1/10, EF 55-60%, mild MR      c.    ECHO  2/11 55-65%       c.    s/p Medtronic BiV ICD with biventricular function now turned        off due to diahragmatic stimulatiob      d.  Right heart catheterization November 05, 2006.  Right atrial        pressure mean of 12, RV pressure 36/8, PA pressure 39/16 with a        mean of 28, wedge pressure was 20.  Fick cardiac output was 5        liters per minute, cardiac index was 2.4.   2. Cardiac cath 01/2006, showed mild nonobstructive CAD  3. COPD GOLD II    - Spirometry July 10, 2008 > FEV1 1.46 56% predicted, ratio of 66%  4. Atrial fibrillation status post AV node ablation       a. not on coumadin due to GIB  5. Iron-deficiency anemia with previous severe GI bleed   6. Hypertension.   7. Hyperlipidemia.   8. Left bundle branch block  9. Diabetes 10.Benign prostatic hypertrophy 09/11/1997 11.Pneumonia, hx of 2-3 times as child 12.Chronic dyspnea 13. Obesity 14. Aborted VF arrest (shocked by ICD) November 2009  Past Surgical  History: Last updated: 06/20/2008 Lung-lobectomy RLL 01/21/1998 EGD prepyloric ulcer, esoph ring, duod avm 09/1997 Colonoscopy polypectomy, divertics, int hemms 11/1997 [facility] GI bleed 02/07-02/12/2002 EGD poss Barrett's 05/22/2002 Colonoscopy polyp,multip(neg) divertics,int hemm 06/26/2002 Colonoscopy polyps, divetics, int hemms 07/10/2004 [facility] SOB, AFIB, CHF 10/11-10/18/2007 Cath min obstruct dz severe LV dysftn EF 25% [facility] acute blood loss, anemia, sys HF, isch cardiomyopathy 11/08-11/03/2006 EGD HH No active bleeding 02/19/2006 Rotator cuff repair 1994 and 1995 L knee surgery 1996 CT Head   No acute abnmlty  10/03/2006 R heart Cath nml (Dr Bensihmon) 11/05/2006 HOSP Acute on Chronis CHF IIIB Nonisch Cardiomyop EF 20-25% Mod-Sev MR 8/14-8/23/08 Colonoscopy 2 Polyps Divertics Int Hemms (Dr Marina Goodell) 06/19/08    Family History: Last updated: 07/31/2009 Father dec 77 CHF (Smoker) Mother dec 81 CHF Brother A 74 Allyne Gee)  Lung Ca Brother A 67 (Burnie) Liver ETOH COPD (disability) Brother A 59 (Doug) Nerves (disability) COPD Brother A 90 Peyton Najjar) Sister A 33 HTN Sister A 66 HTN Obesity  Social History: Last updated: 07/10/2008 Occupation: [employer] Retired Married lives w/ wife 1 adopted Current smoker.  Smoker since age 59.  Quit for 2 years and started back in Dec 2009.  Smokes 1 ppd. Alcohol use-yes Drug use-no  Risk Factors: Alcohol Use: <1 (07/31/2009) Caffeine Use: 2 (07/31/2009) Exercise: no (07/31/2009)  Risk Factors: Smoking Status: current (07/31/2009) Packs/Day: 1.0 (07/31/2009) Passive Smoke Exposure: no (07/31/2009)  Vital Signs:  Patient profile:   77 year old male Height:      64.5 inches Weight:      187 pounds BMI:     31.72 Pulse rate:   74 / minute BP sitting:   100 / 62  (left arm) Cuff size:   regular  Vitals Entered By: Bishop Dublin, CMA (December 27, 2009 1:45 PM)  Physical Exam  General:  The patient was alert and oriented in no acute  distress. HEENT Normal.  Neck veins were flat, carotids were brisk.  Lungs were clear.  Heart sounds were distant regular without murmurs or gallops. ICD site looks fine.  Abdomen was soft with active bowel sounds. There is no clubbing cyanosis or edema. Skin Warm and dry Neuro: aletr and oriented. cranial nerves 2-12 intact.  moves all 4 extemities without difficulty affect pleasant    ICD Specifications Following MD:  Sherryl Manges, MD     Referring MD:  BENSIMHON ICD Vendor:  Medtronic     ICD Model Number:  [phone]     ICD Serial Number:  [id number] ICD DOI:  04/19/2009     ICD Implanting MD:  Sherryl Manges, MD  Lead 1:    Location: RA     DOI: 08/24/2006     Model #: [id number]     Serial #: [id number]     Status: active Lead 2:    Location: RV     DOI: 08/24/2006     Model #: [id number]     Serial #: [id number]     Status: active Lead 3:    Location: LV     DOI: 08/24/2006     Model #: [id number]     Serial #: [id number]     Status: active  Indications::  NICM, CHF  Explantation Comments: 04/19/2009 Medtronic Sentry [id number] H explanted  ICD Follow Up ICD Dependent:  Yes       ICD Device Measurements Configuration: LV TIP TO RV COIL  Episodes Coumadin:  No  Brady Parameters Mode VVIR     Lower Rate Limit:  70     Upper Rate Limit 120  Tachy Zones VF:  200     VT:  OFF     VT1:  171     Impression & Recommendations:  Problem # 1:  CARDIOMYOPATHY, PRIMARY, RESOLVED (ICD-425.4) Doing well from HF perspective. Volume status looks good. Continue current regimen.  Problem # 2:  FATIGUE / MALAISE (ICD-780.79) Unclear etiology. Will check labs including CBC and thyroid panel.  Problem # 3:  TOBACCO ABUSE (ICD-305.1) Counseled on need to quit.   Problem # 4:  ATRIAL FIBRILLATION S/P AV ABLATION (ICD-427.31) Permanent. s/p AV node ablation. Refuses coumadin due to previous life threatening GIB.   Other Orders: T-CBC w/Diff [phone]) T-Comprehensive Metabolic Panel  ([id number]) T-T4, Free [phone]) T-TSH [phone])  Patient Instructions: 1)  Your physician recommends that you have for lab work today (CBC,CMET,TSH,T4) 2)  Your physician recommends that you continue on your current medications as directed. Please refer to the Current Medication list given to you today. 3)  Your physician wants you to follow-up in:   6 MONTHS You will receive a reminder letter in the mail two months in advance. If you don't receive a letter, please call our office to schedule the follow-up appointment.

## 2010-05-15 NOTE — Progress Notes (Signed)
Summary: needs documentation for wheel chair  Phone Note From Pharmacy   Caller: Missy at [facility] [phone] Summary of Call: Needs documentation for pt's wheel chair, must be in office note.  Needs estimated length of need, must include what daily living activities the equipment will improve- grooming, bathing or feeding.  Fax to [phone]. Initial call taken by: Lowella Petties CMA, AAMA,  April 16, 2010 2:52 PM  Follow-up for Phone Call        I already did this today. Hannah Beat MD  April 16, 2010 3:35 PM

## 2010-05-15 NOTE — Medication Information (Signed)
Summary: Diabetes Supplies/[facility]  Diabetes Supplies/[facility]   Imported By: Lanelle Bal 04/04/2010 11:38:51  _____________________________________________________________________  External Attachment:    Type:   Image     Comment:   External Document

## 2010-05-15 NOTE — Letter (Signed)
Summary: Nadara Eaton letter  [place] at [facility]  [address] [place], [place] [address]   Phone: [phone]  Fax: [phone]       11/19/2009 MRN: [id number]  COLLEEN DONAHOE [address] [place], [place]  [address]  Dear Mr. Cathey,  [facility] [place] [facility], and [facility] announce the retirement of Arta Silence, M.D., from full-time practice at the [facility] office effective October 10, 2009 and his plans of returning part-time.  It is important to Dr. Hetty Ely and to our practice that you understand that [facility] has seven physicians in our office for your health care needs.  We will continue to offer the same exceptional care that you have today.    Dr. Hetty Ely has spoken to many of you about his plans for retirement and returning part-time in the fall.   We will continue to work with you through the transition to schedule appointments for you in the office and meet the high standards that [facility] is committed to.   Again, it is with great pleasure that we share the news that Dr. Hetty Ely will return to [facility] in October of 2011 with a reduced schedule.    If you have any questions, or would like to request an appointment with one of our physicians, please call us at [phone] and press the option for Scheduling an appointment.  We take pleasure in providing you with excellent patient care and look forward to seeing you at your next office visit.  Our [facility] Physicians are:  Tillman Abide, M.D. Laurita Quint, M.D. Roxy Manns, M.D. Kerby Nora, M.D. Hannah Beat, M.D. Ruthe Mannan, M.D. We proudly welcomed Raechel Ache, M.D. and Eustaquio Boyden, M.D. to the practice in July/August 2011.  Sincerely,  [facility]

## 2010-05-15 NOTE — Progress Notes (Signed)
Summary: Rx Spiriva  Phone Note Refill Request Call back at [phone] Message from:  [facility] on October 07, 2009 11:49 AM  Refills Requested: Medication #1:  SPIRIVA HANDIHALER 18 MCG CAPS ONE INHALE EVERY MORNING  Method Requested: Electronic Initial call taken by: Sydell Axon LPN,  October 07, 2009 11:49 AM    Prescriptions: SPIRIVA HANDIHALER 18 MCG CAPS (TIOTROPIUM BROMIDE MONOHYDRATE) ONE INHALE EVERY MORNING  #10 PACKS x 4   Entered and Authorized by:   [name] MD   Signed by:   [name] MD on 10/07/2009   Method used:   Electronically to        [facility]* (retail)             ,          Ph: [phone]       Fax: [phone]   RxID:   [id number]

## 2010-05-15 NOTE — Assessment & Plan Note (Signed)
Summary: F10M/AMD  Medications Added COREG 12.5 MG TABS (CARVEDILOL) Take 1/2 tablet by mouth two times a day EPLERENONE 25 MG TABS (EPLERENONE) Take 1/2 by mouth daily LISINOPRIL 20 MG TABS (LISINOPRIL) 1/2  tablet once daily.      Allergies Added: NKDA  Visit Type:  Follow-up Referring Provider:  Dr. Marikay Alar Primary Provider:  [name] MD  CC:  [name]. shortness of breath..  History of Present Illness: [name] is seen in followup today for nonischemic cardiomyopathy, status post AV junction ablation, and CRTD implantation.  He has nonischemic myopathy and subsequent intercurrent normalization of his left ventricular systolic function.    In November 2010, he had syncopal episode and ICD showed VF with appropriate therapy.   Underwent ICD change out in Jan 2011 for ERI without problem. Had echo Feb 2011 ef 55-65%. Had some soreness over site but no drainage or skin breakdown.   He has undergone two urgent neurosurgical procedures, one in the cervical spine and the other related to hematoma formation with quadrapersis complicating the first. He has ongoing weakness and gait instability  Current Medications (verified): 1)  Spiriva Handihaler 18 Mcg Caps (Tiotropium Bromide Monohydrate) .... One Inhale Every Morning 2)  Flomax 0.4 Mg Cp24 (Tamsulosin Hcl) .Marland Kitchen.. 1 Daily By Mouth 3)  Protonix 40 Mg Tbec (Pantoprazole Sodium) .... Take 1 Tablet By Mouth Once A Day 4)  Furosemide 40 Mg Tabs (Furosemide) .... Take One Tablet By Mouth Daily. 5)  Coreg 12.5 Mg Tabs (Carvedilol) .Marland Kitchen.. 1 By Mouth Two Times A Day 6)  Iron 325 (65 Fe) Mg Tabs (Ferrous Sulfate) .... 2 in The Morning and 2 in The Evening 7)  Amaryl 4 Mg Tabs (Glimepiride) .... One Tab By Mouth Daily 8)  Accu-Chek Aviva   Strp (Glucose Blood) .... Use 1 Daily and As Needed For Diabeties 9)  Adprin B 325 Mg Tabs (Aspirin Buf(Cacarb-Mgcarb-Mgo)) .Marland Kitchen.. 1 Daily By Mouth 10)  Colcrys 0.6 Mg Tabs (Colchicine) .Marland Kitchen..  1 Tab By Mouth Two Times A Day As Needed Gout Pain 11)  Eplerenone 25 Mg Tabs (Eplerenone) .Marland Kitchen.. 1 By Mouth Daily 12)  Lisinopril 20 Mg Tabs (Lisinopril) .... 1/2  Tablet Two Times A Day 13)  Lipitor 20 Mg Tabs (Atorvastatin Calcium) .Marland Kitchen.. 1 Daily By Mouth At Bedtime 14)  Klor-Con M20 20 Meq Cr-Tabs (Potassium Chloride Crys Cr) .Marland Kitchen.. 1 Tablet Twice A Day By Mouth 15)  Avodart 0.5 Mg Caps (Dutasteride) .... One By Mouth Daily 16)  Vitamin B-12 2500 Mcg Subl (Cyanocobalamin) .... Take One By Mouth Daily 17)  Vitamin D3 2000 Unit Caps (Cholecalciferol) .Marland Kitchen.. 1 By Mouth Daily 18)  Gabapentin 100 Mg Caps (Gabapentin) .Marland Kitchen.. 1 Tab By Mouth At Bedtime For Peripharal Neuropathy. 19)  Wheelchair, Standard Rolling .... Length of Need = 99 Years Salome Holmes) Dx Codes: 825.23, 824.2, 826.0, V45.02  Allergies (verified): No Known Drug Allergies  Past History:  Past Medical History: Last updated: 05/30/2009  1. CHF secondary to nonischemic  cardiomyopathy      a.    ECHO 10/07, EF 20-25%, moderate to severe MR      b.    ECHO 1/10, EF 55-60%, mild MR      c.    ECHO  2/11 55-65%       c.    s/p Medtronic BiV ICD with biventricular function now turned        off due to diahragmatic stimulatiob      d.  Right heart catheterization November 05, 2006.  Right atrial        pressure mean of 12, RV pressure 36/8, PA pressure 39/16 with a        mean of 28, wedge pressure was 20.  Fick cardiac output was 5        liters per minute, cardiac index was 2.4.   2. Cardiac cath 01/2006, showed mild nonobstructive CAD  3. COPD GOLD II    - Spirometry July 10, 2008 > FEV1 1.46 56% predicted, ratio of 66%  4. Atrial fibrillation status post AV node ablation       a. not on coumadin due to GIB  5. Iron-deficiency anemia with previous severe GI bleed   6. Hypertension.   7. Hyperlipidemia.   8. Left bundle branch block  9. Diabetes 10.Benign prostatic hypertrophy 09/11/1997 11.Pneumonia, hx of 2-3 times as  child 12.Chronic dyspnea 13. Obesity 14. Aborted VF arrest (shocked by ICD) November 2009  Past Surgical History: Last updated: 06/20/2008 Lung-lobectomy RLL 01/21/1998 EGD prepyloric ulcer, esoph ring, duod avm 09/1997 Colonoscopy polypectomy, divertics, int hemms 11/1997 [facility] GI bleed 02/07-02/12/2002 EGD poss Barrett's 05/22/2002 Colonoscopy polyp,multip(neg) divertics,int hemm 06/26/2002 Colonoscopy polyps, divetics, int hemms 07/10/2004 [facility] SOB, AFIB, CHF 10/11-10/18/2007 Cath min obstruct dz severe LV dysftn EF 25% [facility] acute blood loss, anemia, sys HF, isch cardiomyopathy 11/08-11/03/2006 EGD HH No active bleeding 02/19/2006 Rotator cuff repair 1994 and 1995 L knee surgery 1996 CT Head   No acute abnmlty  10/03/2006 R heart Cath nml (Dr Bensihmon) 11/05/2006 [facility]OSP Acute on Chronis CHF IIIB Nonisch Cardiomyop EF 20-25% Mod-Sev MR 8/14-8/23/08 Colonoscopy 2 Polyps Divertics Int Hemms (Dr Marina Goodell) 06/19/08    Family History: Last updated: 07/31/2009 Father dec 77 CHF (Smoker) Mother dec 81 CHF Brother A 74 Allyne Gee)  Lung Ca Brother A 67 (Burnie) Liver ETOH COPD (disability) Brother A 59 (Doug) Nerves (disability) COPD Brother A 81 Peyton Najjar) Sister A 54 HTN Sister A 66 HTN Obesity  Social History: Last updated: 07/10/2008 Occupation: [employer] Retired Married lives w/ wife 1 adopted Current smoker.  Smoker since age 16.  Quit for 2 years and started back in Dec 2009.  Smokes 1 ppd. Alcohol use-yes Drug use-no  Risk Factors: Alcohol Use: <1 (07/31/2009) Caffeine Use: 2 (07/31/2009) Exercise: no (07/31/2009)  Risk Factors: Smoking Status: current (03/28/2010) Packs/Day: 1.0 (03/28/2010) Passive Smoke Exposure: no (07/31/2009)  Vital Signs:  Patient profile:   77 year old male Height:      64.5 inches Weight:      190 pounds BMI:     32.23 Pulse rate:   72 / minute BP sitting:   90 / 60  (left arm) Cuff size:   regular  Vitals Entered By: Bishop Dublin, CMA  (April 16, 2010 3:15 PM)  Physical Exam  General:  Well-developed,well-nourished,in no acute distress; alert,appropriate and cooperative throughout examination Head:  normocephalic and atraumatic.   Neck:  flat neck viens Lungs:  normal respiratory effort clear  Heart:  RRR without murmers Abdomen:  Bowel sounds positive,abdomen soft and non-tender without masses, organomegaly or hernias noted. Msk:          Extremities:  no edema  Neurologic:  alert & oriented X3,     ICD Specifications Following MD:  Sherryl Manges, MD     Referring MD:  [facility] ICD Vendor:  Medtronic     ICD Model Number:  [id number]     ICD Serial Number:  [id number] ICD DOI:  04/19/2009     ICD Implanting MD:  Sherryl Manges, MD  Lead 1:    Location: RA     DOI: 08/24/2006     Model #: [id number]     Serial #: [id number]     Status: active Lead 2:    Location: RV     DOI: 08/24/2006     Model #: [id number]     Serial #: [id number]     Status: active Lead 3:    Location: LV     DOI: 08/24/2006     Model #: [id number]     Serial #: [id number]     Status: active  Indications::  NICM, CHF  Explantation Comments: 04/19/2009 Medtronic Sentry [id number] H explanted  ICD Follow Up Remote Check?  No Battery Voltage:  3.12 V     Charge Time:  9.0 seconds     Underlying rhythm:  A-fib ICD Dependent:  Yes       ICD Device Measurements Atrium:  Amplitude: 1.1 mV, Impedance: 532 ohms,  Right Ventricle:  Impedance: 741 ohms, Threshold: 1.0 V at 0.4 msec Left Ventricle:  Impedance: 532 ohms, Threshold: 1.25 V at 1.5 msec Configuration: LV TIP TO RV COIL Shock Impedance: 43/58 ohms   Episodes MS Episodes:  1     Percent Mode Switch:  100%     Coumadin:  No Shock:  0     ATP:  0     Nonsustained:  0     Atrial Pacing:  0%     Ventricular Pacing:  99.2%  Brady Parameters Mode VVIR     Lower Rate Limit:  70     Upper Rate Limit 120  Tachy Zones VF:  200     VT:  OFF     VT1:  171     Next Remote Date:  07/17/2010     Next  Cardiology Appt Due:  04/14/2011 Tech Comments:  Lead impedance alert values reprogrammed.  A-fib 100%, - coumadin.  Rate response blunted but adequate for the patient's level of activity.  Over the past year he has had 2  cervical disc surgeries and fractured his left foot.  Optivol and throracic impedance at threshold value.  [facility] transmissions every 3 months.  ROV 1 year with Dr. Graciela Husbands in [place]. Altha Harm, LPN  April 16, 2010 3:20 PM   Impression & Recommendations:  Problem # 1:  HYPOTENSION, IATROGENIC (ICD-458.9) as he has had normaliaztrion of LV function, will decrease his afterload reduction as he is symptomatic with low blood pressure, specifrically we will cut in half, epleronoen, coreg and lisinopril  Problem # 2:  CARDIOMYOPATHY, PRIMARY, RESOLVED (ICD-425.4)  continue meds  His updated medication list for this problem includes:    Furosemide 40 Mg Tabs (Furosemide) .Marland Kitchen... Take one tablet by mouth daily.    Coreg 12.5 Mg Tabs (Carvedilol) .Marland Kitchen... Take 1/2 tablet by mouth two times a day    Adprin B 325 Mg Tabs (Aspirin buf(cacarb-mgcarb-mgo)) .Marland Kitchen... 1 daily by mouth    Lisinopril 20 Mg Tabs (Lisinopril) .Marland Kitchen... 1/2  tablet once daily.  Problem # 3:  ATRIAL FIBRILLATION S/P AV ABLATION (ICD-427.31)  complete heart block not on coumadin 2/2 gibleed His updated medication list for this problem includes:    Coreg 12.5 Mg Tabs (Carvedilol) .Marland Kitchen... Take 1/2 tablet by mouth two times a day    Adprin B 325 Mg Tabs (Aspirin buf(cacarb-mgcarb-mgo)) .Marland Kitchen... 1 daily by mouth  His updated medication list for this  problem includes:    Coreg 12.5 Mg Tabs (Carvedilol) .Marland Kitchen... Take 1/2 tablet by mouth two times a day    Adprin B 325 Mg Tabs (Aspirin buf(cacarb-mgcarb-mgo)) .Marland Kitchen... 1 daily by mouth  Problem # 5:  IMPLANTABLE  DEFIBRILLATOR CRT - MDT (ICD-V45.02) .Device parameters and data were reviewed and no changes were made  optivol indes appears to be rising, but I can not tell  whether it is trending up or not,  will rechick in two weeks, but for now continue his current diuretic regime  Patient Instructions: 1)  Your physician recommends that you schedule a follow-up appointment in: 1 year 2)  Your physician has recommended you make the following change in your medication: DECREASE Coreg 12.5 mg 1/2 tablet two times a day. DECREASE Eplerenone 25mg  1/2 tablet once daily. DECREASE Lisinopril 20mg  1/2 tablet once daily.

## 2010-05-15 NOTE — Progress Notes (Signed)
Summary: pharmacy wants to change to generic flomax  Phone Note From Pharmacy   [facility]er[facility] Summary of Call: Pharmacy is asking to change from flomax to generic, form is on your desk. Initial call taken by: Lowella Petties CMA, AAMA,  May 07, 2010 4:23 PM

## 2010-05-15 NOTE — Progress Notes (Signed)
Summary: BP is running low  Phone Note Call from Patient Call back at [phone]   Caller: Daughter/[name] Call For: Dr. Ermalene Searing Summary of Call: Patient has an appointment scheduled with Dr. Ermalene Searing this Friday. Sheralyn Boatman is calling to let you know that his BP has been running low; BP 100/58 and 98/60. Patient is not having any symptoms. The physical therapist has been checking his BP and felt that the doctor needed to know this. Patient is on 4 different kinds of BP medication.  Initial call taken by: Sydell Axon LPN,  March 24, 2010 2:26 PM  Follow-up for Phone Call        this is ok, f/u fri as long as no symptoms Hannah Beat MD  March 24, 2010 2:48 PM   Additional Follow-up for Phone Call Additional follow up Details #1::        Patients duaghter advised.Consuello Masse CMA   Additional Follow-up by: Benny Lennert CMA Duncan Dull),  March 24, 2010 2:52 PM

## 2010-05-15 NOTE — Progress Notes (Signed)
Summary: RX   Phone Note Refill Request Call back at Home Phone [phone] Message from:  Patient on June 10, 2009 11:20 AM  Refills Requested: Medication #1:  EPLERENONE 25 MG TABS 1 by mouth daily  Medication #2:  FUROSEMIDE 80 MG  Medication #3:  POT-CHLOR 20 MEQ  Medication #4:  COREG 12.5 MG TABS 1 by mouth two times a day GLIMEPIRIDE 4 MG-COLCHICINE .6 MG- [facility] 90 DAY SUPPLY  Initial call taken by: Harlon Flor,  June 10, 2009 11:24 AM    Prescriptions: KLOR-CON M20 20 MEQ CR-TABS (POTASSIUM CHLORIDE CRYS CR) 1 tablet twice a day by mouth  #180 x 3   Entered by:   Mercer Pod   Authorized by:   Dolores Patty, MD, [facility]   Signed by:   Mercer Pod on 06/10/2009   Method used:   Electronically to        [facility]* (mail-order)             ,          Ph: [phone]       Fax: [phone]   RxID:   [id number] COREG 12.5 MG TABS (CARVEDILOL) 1 by mouth two times a day  #180 x 3   Entered by:   Mercer Pod   Authorized by:   Dolores Patty, MD, [facility]   Signed by:   Mercer Pod on 06/10/2009   Method used:   Electronically to        [facility]* (mail-order)             ,          Ph: [phone]       Fax: [phone]   RxID:   [id number] LASIX 80 MG TABS (FUROSEMIDE) 1/2 tablet by mouth in am  #90 x 3   Entered by:   Mercer Pod   Authorized by:   Dolores Patty, MD, [facility]   Signed by:   Mercer Pod on 06/10/2009   Method used:   Electronically to        [facility]* (mail-order)             ,          Ph: [phone]       Fax: [phone]   RxID:   [id number] EPLERENONE 25 MG TABS (EPLERENONE) 1 by mouth daily  #30 x 6   Entered by:   Mercer Pod   Authorized by:   Dolores Patty, MD, [facility]   Signed by:   Mercer Pod on 06/10/2009   Method used:   Electronically to        [facility]* (mail-order)             ,          Ph: [phone]       Fax: [phone]   RxID:   [id number]

## 2010-05-15 NOTE — Letter (Signed)
Summary: CMN for [facility]  CMN for [facility]   Imported By: Lanelle Bal 04/04/2010 11:38:06  _____________________________________________________________________  External Attachment:    Type:   Image     Comment:   External Document

## 2010-05-15 NOTE — Letter (Signed)
Summary: Implantable Device Instructions  Architectural technologist at [address]   [place], [place] [address]   Phone: [phone]  Fax: [phone]      Implantable Device Instructions  You are scheduled for:  _____ Permanent Transvenous Pacemaker _____ Implantable Cardioverter Defibrillator _____ Implantable Loop Recorder __x__ Generator Change  on 04/19/09 with Dr.Lavalle Skoda.  1.  Please arrive at the Short Stay Center at [facility] at 10am on the day of your procedure.  2.  Do not eat or drink the night before your procedure.  4.  Do NOT take these medications for ____ days prior to your procedure:  _________________________.  Take your last dose of Coumadin on ________.  5.  Plan for an overnight stay.  Bring your insurance cards and a list of your medications.  6.  Wash your chest and neck with antibacterial soap (any brand) the evening before and the morning of your procedure.  Rinse well.  7.  Education material received:     Pacemaker _____           ICD _____           Arrhythmia _____  *If you have ANY questions after you get home, please call the office [phone].  *Every attempt is made to prevent procedures from being rescheduled.  Due to the nauture of Electrophysiology, rescheduling can happen.  The physician is always aware and directs the staff when this occurs.

## 2010-05-15 NOTE — Letter (Signed)
Summary: [facility]  [facility]   Imported By: Lanelle Bal 04/08/2010 13:16:48  _____________________________________________________________________  External Attachment:    Type:   Image     Comment:   External Document

## 2010-05-15 NOTE — Progress Notes (Signed)
  Phone Note Call from Patient Call back at Home Phone [phone]   Caller: Sheralyn Boatman Tickle-daughter Call For: [facility] Summary of Call: Pt. was being seen by Dr.Schaller.  Pt's daughter called and said she had spoken to you and you said you would take Mr.Prisco as your patient.  Your already seeing his wife,Peggy.  I scheduled a 30 minute appt. for pt w/ you on 01/27/10.  He has been weak and has been having leg,arm,shoulder pain for the past 2 weeks.  I just wanted to make sure that you agreed to see pt.. Initial call taken by: Beau Fanny,  January 22, 2010 10:09 AM  Follow-up for Phone Call        I don't remember but I am sure I did. Just make sure he knows that if his symptoms are getting severe he may need to be seen sooner by anyone available..then could establish with me. Make sure no chest pain,  or severe shortness of breath. Follow-up by: Kerby Nora MD,  January 22, 2010 10:53 AM  Additional Follow-up for Phone Call Additional follow up Details #1::        Patient says that the numbness is getting worse slowly but, its not pain or anything. He will call if he gets worse to see someone else.Consuello Masse CMA   Additional Follow-up by: Benny Lennert CMA Duncan Dull),  January 22, 2010 11:00 AM     Appended Document:  Patients wants to have labs done tomorrow before the sees you on monday  Appended Document: Orders Update  Had labs with cards in 9/15/.2011..nml cbc and thyroid. Last B12 nml in 07/2009  Please order fasting CMET, lipids, A1C Dx 250.00, cbc Dx 780.79, vit D Dx 268.9 Kerby Nora MD  January 22, 2010 11:16 AM   Clinical Lists Changes  Problems: Added new problem of UNSPECIFIED VITAMIN D DEFICIENCY (ICD-268.9) - Signed     appt made for 01-23-2010

## 2010-05-15 NOTE — Cardiovascular Report (Signed)
Summary: Office Visit Remote   Office Visit Remote   Imported By: Roderic Ovens 12/04/2009 15:02:15  _____________________________________________________________________  External Attachment:    Type:   Image     Comment:   External Document

## 2010-05-21 NOTE — Progress Notes (Signed)
Summary: clarification is needed for lisinopril, eplerenone, coreg  Phone Note From Pharmacy   Caller: [facility] Summary of Call: Clarification is needed on lisinopril,eplerenone, coreg.  Forms from [facility] are  on your desk. Initial call taken by: Lowella Petties CMA, AAMA,  May 12, 2010 11:46 AM

## 2010-06-02 ENCOUNTER — Encounter (INDEPENDENT_AMBULATORY_CARE_PROVIDER_SITE_OTHER): Payer: Self-pay | Admitting: *Deleted

## 2010-06-04 NOTE — Medication Information (Signed)
Summary: [facility] Generic Medication Request  [facility] Generic Medication Request   Imported By: Kassie Mends 05/27/2010 09:01:42  _____________________________________________________________________  External Attachment:    Type:   Image     Comment:   External Document

## 2010-06-04 NOTE — Medication Information (Signed)
Summary: [facility] prescription information request   [facility] prescription information request   Imported By: Kassie Mends 05/27/2010 08:52:48  _____________________________________________________________________  External Attachment:    Type:   Image     Comment:   External Document

## 2010-06-04 NOTE — Medication Information (Signed)
[facility]   [facility]   Imported By: Kassie Mends 05/27/2010 09:10:11  _____________________________________________________________________  External Attachment:    Type:   Image     Comment:   External Document

## 2010-06-10 NOTE — Cardiovascular Report (Signed)
Summary: Office Visit Remote   Office Visit Remote   Imported By: Roderic Ovens 06/06/2010 11:22:44  _____________________________________________________________________  External Attachment:    Type:   Image     Comment:   External Document

## 2010-06-10 NOTE — Letter (Signed)
Summary: Remote Device Check  [facility], Main Office  [address]   [place], [place] [address]   Phone: [phone]  Fax: [phone]     June 02, 2010 MRN: [id number]   [name] [address] [place], [place]  [address]   Dear Mr. Overholt,   Your remote transmission was recieved and reviewed by your physician.  All diagnostics were within normal limits for you.  __X___Your next transmission is scheduled for:  08-07-2010.  Please transmit at any time this day.  If you have a wireless device your transmission will be sent automatically.   Sincerely,  Vella Kohler

## 2010-06-17 ENCOUNTER — Encounter: Payer: Self-pay | Admitting: Family Medicine

## 2010-06-24 LAB — POCT I-STAT 4, (NA,K, GLUC, HGB,HCT)
Glucose, Bld: 68 mg/dL — ABNORMAL LOW (ref 70–99)
Potassium: 4.1 mEq/L (ref 3.5–5.1)

## 2010-06-24 LAB — GLUCOSE, CAPILLARY
Glucose-Capillary: 120 mg/dL — ABNORMAL HIGH (ref 70–99)
Glucose-Capillary: 122 mg/dL — ABNORMAL HIGH (ref 70–99)
Glucose-Capillary: 122 mg/dL — ABNORMAL HIGH (ref 70–99)
Glucose-Capillary: 129 mg/dL — ABNORMAL HIGH (ref 70–99)
Glucose-Capillary: 138 mg/dL — ABNORMAL HIGH (ref 70–99)
Glucose-Capillary: 141 mg/dL — ABNORMAL HIGH (ref 70–99)
Glucose-Capillary: 142 mg/dL — ABNORMAL HIGH (ref 70–99)
Glucose-Capillary: 143 mg/dL — ABNORMAL HIGH (ref 70–99)
Glucose-Capillary: 144 mg/dL — ABNORMAL HIGH (ref 70–99)
Glucose-Capillary: 154 mg/dL — ABNORMAL HIGH (ref 70–99)
Glucose-Capillary: 163 mg/dL — ABNORMAL HIGH (ref 70–99)
Glucose-Capillary: 197 mg/dL — ABNORMAL HIGH (ref 70–99)
Glucose-Capillary: 228 mg/dL — ABNORMAL HIGH (ref 70–99)
Glucose-Capillary: 264 mg/dL — ABNORMAL HIGH (ref 70–99)
Glucose-Capillary: 282 mg/dL — ABNORMAL HIGH (ref 70–99)
Glucose-Capillary: 62 mg/dL — ABNORMAL LOW (ref 70–99)
Glucose-Capillary: 81 mg/dL (ref 70–99)
Glucose-Capillary: 90 mg/dL (ref 70–99)
Glucose-Capillary: 90 mg/dL (ref 70–99)

## 2010-06-24 LAB — APTT: aPTT: 36 seconds (ref 24–37)

## 2010-06-24 LAB — BASIC METABOLIC PANEL
BUN: 19 mg/dL (ref 6–23)
CO2: 29 mEq/L (ref 19–32)
Chloride: 103 mEq/L (ref 96–112)
Creatinine, Ser: 1.09 mg/dL (ref 0.4–1.5)

## 2010-06-24 LAB — PROTIME-INR
INR: 1.02 (ref 0.00–1.49)
Prothrombin Time: 13.6 seconds (ref 11.6–15.2)

## 2010-06-24 LAB — SURGICAL PCR SCREEN
MRSA, PCR: NEGATIVE
Staphylococcus aureus: NEGATIVE

## 2010-06-24 LAB — CBC
MCH: 31 pg (ref 26.0–34.0)
MCV: 91.2 fL (ref 78.0–100.0)
Platelets: 157 10*3/uL (ref 150–400)
RDW: 14.6 % (ref 11.5–15.5)
WBC: 10 10*3/uL (ref 4.0–10.5)

## 2010-06-24 LAB — DIFFERENTIAL
Basophils Absolute: 0.1 10*3/uL (ref 0.0–0.1)
Basophils Relative: 1 % (ref 0–1)
Eosinophils Absolute: 0.2 10*3/uL (ref 0.0–0.7)
Eosinophils Relative: 2 % (ref 0–5)

## 2010-06-24 LAB — CROSSMATCH: ABO/RH(D): O POS

## 2010-06-29 LAB — GLUCOSE, CAPILLARY: Glucose-Capillary: 106 mg/dL — ABNORMAL HIGH (ref 70–99)

## 2010-07-08 ENCOUNTER — Ambulatory Visit
Admission: RE | Admit: 2010-07-08 | Discharge: 2010-07-08 | Disposition: A | Discharge status: Home / Self Care | Payer: Medicare Other | Source: Ambulatory Visit | Attending: Neurological Surgery | Admitting: Neurological Surgery

## 2010-07-08 ENCOUNTER — Other Ambulatory Visit: Payer: Self-pay | Admitting: Neurological Surgery

## 2010-07-08 DIAGNOSIS — Z9889 Other specified postprocedural states: Secondary | ICD-10-CM

## 2010-07-17 ENCOUNTER — Ambulatory Visit (INDEPENDENT_AMBULATORY_CARE_PROVIDER_SITE_OTHER): Payer: Medicare Other | Admitting: *Deleted

## 2010-07-17 DIAGNOSIS — R0989 Other specified symptoms and signs involving the circulatory and respiratory systems: Secondary | ICD-10-CM

## 2010-07-17 DIAGNOSIS — Z95810 Presence of automatic (implantable) cardiac defibrillator: Secondary | ICD-10-CM

## 2010-07-17 DIAGNOSIS — I5023 Acute on chronic systolic (congestive) heart failure: Secondary | ICD-10-CM

## 2010-07-17 DIAGNOSIS — I509 Heart failure, unspecified: Secondary | ICD-10-CM

## 2010-07-18 ENCOUNTER — Other Ambulatory Visit: Payer: Self-pay

## 2010-07-21 NOTE — Progress Notes (Signed)
icd remote w/icm  

## 2010-07-24 LAB — GLUCOSE, CAPILLARY: Glucose-Capillary: 83 mg/dL (ref 70–99)

## 2010-08-03 ENCOUNTER — Encounter: Payer: Self-pay | Admitting: *Deleted

## 2010-08-04 ENCOUNTER — Ambulatory Visit (INDEPENDENT_AMBULATORY_CARE_PROVIDER_SITE_OTHER): Payer: Medicare Other | Admitting: Family Medicine

## 2010-08-04 ENCOUNTER — Other Ambulatory Visit: Payer: Self-pay | Admitting: *Deleted

## 2010-08-04 ENCOUNTER — Encounter: Payer: Self-pay | Admitting: Family Medicine

## 2010-08-04 DIAGNOSIS — K5909 Other constipation: Secondary | ICD-10-CM

## 2010-08-04 DIAGNOSIS — D509 Iron deficiency anemia, unspecified: Secondary | ICD-10-CM

## 2010-08-04 DIAGNOSIS — C61 Malignant neoplasm of prostate: Secondary | ICD-10-CM

## 2010-08-04 DIAGNOSIS — E119 Type 2 diabetes mellitus without complications: Secondary | ICD-10-CM

## 2010-08-04 DIAGNOSIS — N400 Enlarged prostate without lower urinary tract symptoms: Secondary | ICD-10-CM

## 2010-08-04 DIAGNOSIS — G609 Hereditary and idiopathic neuropathy, unspecified: Secondary | ICD-10-CM

## 2010-08-04 DIAGNOSIS — J449 Chronic obstructive pulmonary disease, unspecified: Secondary | ICD-10-CM

## 2010-08-04 DIAGNOSIS — E785 Hyperlipidemia, unspecified: Secondary | ICD-10-CM

## 2010-08-04 DIAGNOSIS — Z0000 Encounter for general adult medical examination without abnormal findings: Secondary | ICD-10-CM

## 2010-08-04 MED ORDER — FUROSEMIDE 40 MG PO TABS: 40.0000 mg | ORAL_TABLET | Freq: Every day | ORAL | Status: DC

## 2010-08-04 MED ORDER — ATORVASTATIN CALCIUM 20 MG PO TABS: 20.0000 mg | ORAL_TABLET | Freq: Every day | ORAL | Status: DC

## 2010-08-04 MED ORDER — LISINOPRIL 20 MG PO TABS: 20.0000 mg | ORAL_TABLET | Freq: Every day | ORAL | Status: DC

## 2010-08-04 MED ORDER — GLIMEPIRIDE 4 MG PO TABS: 4.0000 mg | ORAL_TABLET | Freq: Every day | ORAL | Status: DC

## 2010-08-04 NOTE — Assessment & Plan Note (Signed)
Due for reeval.  Likely well controlled.

## 2010-08-04 NOTE — Assessment & Plan Note (Signed)
On high dose iron.. Will re-eval to determine if we need to continue.  It is causing constipaiton.

## 2010-08-04 NOTE — Progress Notes (Signed)
Subjective:    Patient ID: [name], male    DOB: 23-Sep-1930, 77 y.o.   MRN: [id number]  HPI  I have personally reviewed the Medicare Annual Wellness questionnaire and have noted 1. The patient's medical and social history 2. Their use of alcohol, tobacco or illicit drugs 3. Their current medications and supplements 4. The patient's functional ability including ADL's, fall risks, home safety risks and hearing or visual             impairment. 5. Diet and physical activities 6. Evidence for depression or mood disorders The patients weight, height, BMI and visual acuity have been recorded in the chart I have made referrals, counseling and provided education to the patient based review of the above and I have provided the pt with a written personalized care plan for preventive services.  In last 9months: he has had 2 neck surgeries.Marland Kitchen on C4... had second syurgery due to complication of temporary paralysis due to blood clot in neck.  MD..Dr. Yetta Barre.  Paralysis improved greatly.. no able to walk, but still wobbly, some weakness but may not return to normal.  Wearing neck brace.  Not taking any pain med or muscle relaxant.  Strength improving day to day... using a cane and a walker.  Occ needs electric wheelchair.  Working on home rehab.  At last OV.Marland Kitchen low BPs.Marland KitchenMarland KitchenHeld norvasc continue lisinopril and coreg.  when saw Dr. Graciela Husbands on 1/4.. he halved all BP meds.  Since then no low BPS.Marland Kitchen no dizziness.  BP in last few months running in nml range.  Resolved foot fracture: Treated by Dr. Patsy Lager.  Had fallen using walker... lost balance getting out of bed.  No syncopal episode.   Seeing Dr. Graciela Husbands and Dr. Derl Barrow for nonischemic cardiomyopathy, status post AV junction ablation, and CRTD implantation. Last OV 04/2010  In November 2010, he had syncopal episode and ICD showed VF with appropriate therapy.   Diabetes: Due for lab eval. Check CBGs daily.Marland Kitchen FBS 85-100 Occ low less tahn 60.. One  last week after mowing the yard.  Peripheral neuropathy.. Poor control on gabapentin.  High cholesterol: Due for lab eval.   COPD: stable on spiriva Stable daily cough.  Still smoking 1 ppd...precontemplative smoking cessation.  Prostate adenocarcinoma.. Followed by.. Dr. Vonita Moss every 90 days. Stable at this time around 1.5. Has appt in June for follow up.   Last PSA  Problems continue in past 6 months with constipation: BM occuring every 3 days but straining. ON iron.  Use mineral oil and dulcolax. Has never tried miralax.  Review of Systems  Constitutional: Negative for fever, fatigue and unexpected weight change.  HENT: Negative for ear pain, congestion, sore throat, rhinorrhea, trouble swallowing and postnasal drip.   Eyes: Negative for pain.  Respiratory: Negative for cough, shortness of breath and wheezing.   Cardiovascular: Negative for chest pain, palpitations and leg swelling.  Gastrointestinal: Positive for constipation. Negative for nausea, abdominal pain, diarrhea and blood in stool.  Genitourinary: Negative for dysuria, urgency, hematuria, discharge, penile swelling, scrotal swelling, difficulty urinating, penile pain and testicular pain.  Skin: Negative for rash.  Neurological: Positive for weakness and numbness. Negative for syncope, light-headedness and headaches.  Psychiatric/Behavioral: Negative for behavioral problems and dysphoric mood. The patient is not nervous/anxious.        Objective:   Physical Exam  Constitutional: He appears well-developed and well-nourished.  Non-toxic appearance. He does not appear ill. No distress.  HENT:  Head: Normocephalic and atraumatic.  Right Ear:  Hearing, tympanic membrane, external ear and ear canal normal.  Left Ear: Hearing, tympanic membrane, external ear and ear canal normal.  Nose: Nose normal.  Mouth/Throat: Uvula is midline, oropharynx is clear and moist and mucous membranes are normal.  Eyes: Conjunctivae, EOM  and lids are normal. Pupils are equal, round, and reactive to light. No foreign bodies found.  Neck: Trachea normal, normal range of motion and phonation normal. Neck supple. Carotid bruit is not present. No mass and no thyromegaly present.  Cardiovascular: Normal rate, regular rhythm, S1 normal, S2 normal, intact distal pulses and normal pulses.  Exam reveals distant heart sounds. Exam reveals no gallop.   No murmur heard. Pulmonary/Chest: Breath sounds normal. He has no wheezes. He has no rhonchi. He has no rales.  Abdominal: Soft. Normal appearance and bowel sounds are normal. There is no hepatosplenomegaly. There is no tenderness. There is no rebound, no guarding and no CVA tenderness. No hernia. Hernia confirmed negative in the right inguinal area and confirmed negative in the left inguinal area.  Genitourinary: Rectal exam shows no external hemorrhoid, no internal hemorrhoid, no fissure, no mass, no tenderness and anal tone normal. Guaiac negative stool. Prostate is not enlarged and not tender. Right testis shows no mass and no tenderness. Left testis shows no mass and no tenderness. No paraphimosis or penile tenderness.       Per URO.  Lymphadenopathy:    He has no cervical adenopathy.       Right: No inguinal adenopathy present.       Left: No inguinal adenopathy present.  Neurological: He is alert. He has normal reflexes. He displays atrophy. A sensory deficit is present. No cranial nerve deficit. He exhibits abnormal muscle tone. Gait abnormal.       Decreased strength  in B lower extremeties  Decreased monofilament sensation in B feet  Skin: Skin is warm, dry and intact. No rash noted.  Psychiatric: He has a normal mood and affect. His speech is normal and behavior is normal. Judgment normal.   Diabetic foot exam: Normal inspection No skin breakdown No calluses  Normal DP pulses Normal sensation to light tough and monofilament Nails normal         Assessment & Plan:

## 2010-08-04 NOTE — Assessment & Plan Note (Signed)
Due for re-eval. 

## 2010-08-04 NOTE — Assessment & Plan Note (Signed)
INcrease water and fiber in diet. Add miralax. We will see if we can try to decrease iron dose some.

## 2010-08-04 NOTE — Assessment & Plan Note (Signed)
Per URO 

## 2010-08-04 NOTE — Assessment & Plan Note (Signed)
Stable on spiriva. precontemplative for smoking cessation.

## 2010-08-04 NOTE — Assessment & Plan Note (Signed)
Trialof higher dose gabapentin..may need to titrate up.

## 2010-08-04 NOTE — Patient Instructions (Addendum)
Increase gabapentin to 300 mg at bedtime. Call if neuropathy not improved in 2 weeks. Increase water  And fiber in diet. Add miralax 17 mg daily. Call insurance to see if they cover shingles vaccine.. Let us know if they do. Make appt for yearly diabetes eye exam.

## 2010-08-07 ENCOUNTER — Encounter: Payer: Self-pay | Admitting: *Deleted

## 2010-08-07 ENCOUNTER — Telehealth: Payer: Self-pay | Admitting: *Deleted

## 2010-08-07 NOTE — Telephone Encounter (Signed)
[facility] is asking for clarification on directions on prinivil script.  Form is on your desk.

## 2010-08-07 NOTE — Telephone Encounter (Signed)
Will complete when back in office on Friday.

## 2010-08-11 ENCOUNTER — Other Ambulatory Visit (INDEPENDENT_AMBULATORY_CARE_PROVIDER_SITE_OTHER): Payer: Medicare Other

## 2010-08-11 DIAGNOSIS — M109 Gout, unspecified: Secondary | ICD-10-CM

## 2010-08-11 DIAGNOSIS — D509 Iron deficiency anemia, unspecified: Secondary | ICD-10-CM

## 2010-08-11 DIAGNOSIS — E119 Type 2 diabetes mellitus without complications: Secondary | ICD-10-CM

## 2010-08-11 DIAGNOSIS — E785 Hyperlipidemia, unspecified: Secondary | ICD-10-CM

## 2010-08-11 LAB — LIPID PANEL
LDL Cholesterol: 43 mg/dL (ref 0–99)
VLDL: 25 mg/dL (ref 0.0–40.0)

## 2010-08-11 LAB — COMPREHENSIVE METABOLIC PANEL
ALT: 43 U/L (ref 0–53)
Albumin: 3.8 g/dL (ref 3.5–5.2)
Alkaline Phosphatase: 114 U/L (ref 39–117)
CO2: 29 mEq/L (ref 19–32)
Calcium: 9.3 mg/dL (ref 8.4–10.5)
Glucose, Bld: 121 mg/dL — ABNORMAL HIGH (ref 70–99)
Potassium: 5.6 mEq/L — ABNORMAL HIGH (ref 3.5–5.1)
Sodium: 138 mEq/L (ref 135–145)
Total Bilirubin: 0.5 mg/dL (ref 0.3–1.2)
Total Protein: 6.1 g/dL (ref 6.0–8.3)

## 2010-08-11 LAB — CBC WITH DIFFERENTIAL/PLATELET
Basophils Absolute: 0 10*3/uL (ref 0.0–0.1)
Lymphocytes Relative: 32.3 % (ref 12.0–46.0)
Monocytes Relative: 10.1 % (ref 3.0–12.0)
Neutrophils Relative %: 55.4 % (ref 43.0–77.0)
Platelets: 159 10*3/uL (ref 150.0–400.0)
RDW: 16.4 % — ABNORMAL HIGH (ref 11.5–14.6)

## 2010-08-11 LAB — URIC ACID: Uric Acid, Serum: 8.3 mg/dL — ABNORMAL HIGH (ref 4.0–7.8)

## 2010-08-11 LAB — IRON AND TIBC
Iron: 102 ug/dL (ref 42–165)
UIBC: 243 ug/dL

## 2010-08-11 LAB — FERRITIN: Ferritin: 104.2 ng/mL (ref 22.0–322.0)

## 2010-08-11 LAB — TRANSFERRIN: Transferrin: 270.3 mg/dL (ref 212.0–360.0)

## 2010-08-11 LAB — HEMOGLOBIN A1C: Hgb A1c MFr Bld: 6.6 % — ABNORMAL HIGH (ref 4.6–6.5)

## 2010-08-13 ENCOUNTER — Telehealth: Payer: Self-pay | Admitting: Family Medicine

## 2010-08-13 DIAGNOSIS — E875 Hyperkalemia: Secondary | ICD-10-CM

## 2010-08-13 NOTE — Telephone Encounter (Signed)
Patient says he is taken 2 medication for gout right now and he ha appto n Friday for potassium recheck

## 2010-08-13 NOTE — Telephone Encounter (Signed)
Notify pt stable hemoglobin..iron levels are high...okay to decrease to one tab BID to try to help with constipation. Potassium level is high.. Hold potasssium for 2 days entirely, then decrease potassium to one tablet daily...make appt for BMET repeat in 2 days. Cholesterol at goal, Diabetes at goal. Nml liver and stable kidney funtion. Uric acid suggests that gout is poorly controlled. Has he ever considered or used a medication such as allopurinol to help prevent gout flares (colchicine does not work effectively to lower uric acid only treats gout when flaring up)?  Also when was his last flare?  Is he having any current gout pain? Please let me know as I would recommend allopurinol.

## 2010-08-15 ENCOUNTER — Other Ambulatory Visit (INDEPENDENT_AMBULATORY_CARE_PROVIDER_SITE_OTHER): Payer: Medicare Other | Admitting: Family Medicine

## 2010-08-15 DIAGNOSIS — E875 Hyperkalemia: Secondary | ICD-10-CM

## 2010-08-15 LAB — BASIC METABOLIC PANEL
BUN: 24 mg/dL — ABNORMAL HIGH (ref 6–23)
Calcium: 8.9 mg/dL (ref 8.4–10.5)
GFR: 64.12 mL/min (ref >60.00)
Glucose, Bld: 133 mg/dL — ABNORMAL HIGH (ref 70–99)
Potassium: 4.6 mEq/L (ref 3.5–5.1)

## 2010-08-26 NOTE — Assessment & Plan Note (Signed)
[facility] OFFICE NOTE   [name], [name]                    MRN:          [id number]  DATE:09/15/2006                            DOB:          04-22-1930    PRIMARY CARE PHYSICIAN:  Arta Silence, M.D.   INTERVAL HISTORY:  [name] is a delightful 77 year old male with a  history of severe congestive heart failure, secondary to non-ischemic  cardiomyopathy with an EF of 20-25%.  He also has moderate to severe  mitral regurgitation, chronic atrial fibrillation, severe COPD, and  diabetes.  Recently he underwent AV node ablation and biventricular  pacemaker for refractory heart failure.   We saw him last week and he was mildly volume-overloaded.  We doubled  his Lasix.  He said he has not realized significant change in his urine  output, although he does feel somewhat better, and he seems to be slowly  improving.  However, he does remain significantly short of breath, just  with mild to moderate activities.  His main complaint today is that he  frequently feels a twitching in his stomach and back.  It is worse when  he lays down.  He says it is very annoying to him.  He denies any  orthopnea, no PND, no chest pain.  No lower extremity edema.  Weight is  down 2 pounds.   CURRENT MEDICATIONS:  1. Lisinopril 20 a day.  2. Digoxin 0.125 a day.  3. Aspirin 325 a day.  4. Protonix 40 a day.  5. Flomax 0.8.  6. Potassium 20 a day.  7. Lipitor 20 a day.  8. Spiriva.  9. Iron.  10.Lasix 80 once a day.  11.Coreg 18.75 b.i.d.  12.Metformin 500 b.i.d.   PHYSICAL EXAM:  He is a very pleasant man, walks around the clinic  without any respiratory distress.  Blood pressure is 104/62, heart rate  84, weight is 208.  HEENT:  Normal.  NECK:  Supple.  JVP is flat.  Carotids are 2+ bilaterally without any  bruits.  There is no lymphadenopathy or thyromegaly.  CARDIAC:  Regular.  He has distant heart  sounds.  There is no S3.  Defibrillator site looks good.  On palpation of his chest and abdomen  there appears to be diaphragmatic stimulation from his pacemaker.  LUNGS:  Have decreased air movement throughout, but are clear.  ABDOMEN:  Obese, nontender, nondistended.  No hepatosplenomegaly, no  bruits, no mass.  EXTREMITIES:  Warm with no cyanosis, clubbing or edema.  No rash.  NEUROLOGIC:  He is alert and oriented times three.  Cranial nerves II  through XII are intact.  Moves all four extremities without difficulty.  Affect is pleasant.   ASSESSMENT AND PLAN:  Chronic systolic congestive heart failure.  He is  doing somewhat better.  There are no signs of volume overload.  We will  stop his potassium and start spironolactone 12.5 mg once a day.  I will  ask him to see Dr. Graciela Husbands this afternoon to see if he can help with the  diaphragmatic pacing.  We  will check a BMET this week and next, given  his initiation of spironolactone.  Continue to follow him closely.  See  him back in two weeks, titrate his ACE inhibitor and beta blocker very  gently, as he can tolerate it.  We did discuss the possibility of home  inotropic therapy at some point.  He said he is very reluctant to do  this.     Johnny Buckles. Bensimhon, MD  Electronically Signed    DRB/MedQ  DD: 09/15/2006  DT: 09/15/2006  Job #: [id number]   cc:   Arta Silence, MD

## 2010-08-26 NOTE — Assessment & Plan Note (Signed)
[facility] OFFICE NOTE   [name], [name]                    MRN:          [id number]  DATE:09/07/2006                            DOB:          1930-08-08    PRIMARY CARE PHYSICIAN:  Arta Silence.   INTERVAL HISTORY:  [name] is a delightful 77 year old male with a  history of severe congestive heart failure secondary to nonischemic  cardiomyopathy, EF of 20% to 25%.  Also has moderate to severe mitral  regurgitation, chronic atrial fibrillation, severe COPD and diabetes.  He has previously had a significant GI bleed with a negative workup and  thus, he has not been maintained on Coumadin.   He recently underwent AV node ablation and biventricular pacemaker for  his refractory heart failure.  He returns today for routine followup.  He says he continues to feel poorly.  He gets winded just trying to get  to the mailbox and back.  He also has had some mild orthopnea over the  past few nights.  His weight is up a few pounds on his scales.  He  denies any lower extremity edema.  No chest pain.   CURRENT MEDICATIONS:  1. Lisinopril 20.  2. Digoxin 0.125 a day.  3. Aspirin 325.  4. Protonix 40.  5. Flomax 0.8.  6. Potassium 20.  7. Lipitor 20.  8. Spiriva.  9. Iron 325 3 times a day.  10.Lasix 80 a day.  11.Coreg 18.75 b.i.d.  12.Xopenex nebulizers.  13.Metformin 500 a day.  14.B12 shots.   PHYSICAL EXAMINATION:  GENERAL:  He is a very pleasant man who walks  around the clinic with no respiratory distress.  He walks slowly.  VITAL SIGNS:  Blood pressure is 94/52 with a heart rate of 80, weight is  210 which is up from 199 about a month ago.  HEENT:  Normal.  NECK:  Supple.  JVD about 7 cm of water.  Carotids are 2+ bilaterally  without bruits.  There is no lymphadenopathy or thyromegaly.  CARDIAC:  He has distant heart sounds.  He is regular.  There is no S3.  His defibrillator site is  mildly swollen but not concerning  so.  There  is no erythema or drainage.  LUNGS:  Decreased air movement throughout but are clear.  ABDOMEN:  Obese, nontender, nondistended.  No hepatosplenomegaly.  No  bruits.  No masses.  EXTREMITIES:  Warm with no cyanosis or clubbing.  There is trace edema.  No rash.  NEURO:  He is alert and oriented x3.  Cranial nerves II-XII are intact.  Moves all 4 extremities without difficulty.  Affect is pleasant.   EKG:  Shows underlying atrial fibrillation with biventricular pacing and  an occasional PVC.  Rate is 80.   ASSESSMENT/PLAN:  1. Chronic systolic congestive heart failure.  This remains quite      severe, NYH class 3B.  He does have just minimal fluid on board.  I      think it is reasonable to go ahead and increase his Lasix to 80  b.i.d. for several days.  I will bring him back in 1 week.  We have      discussed the possibilities of possible home inotropic therapy or      referral for consideration of a left ventricular assist device.  I      do not think he is a transplant candidate given his age and chronic      obstructive pulmonary disease.  It may be reasonable to consider a      right heart cath prior to this referral, however, given his      recently placed biventricular device I would like to wait a few      weeks for the lead to heal in.  2. Atrial fibrillation.  He is status post AV node ablation.  3. Diabetes.  I did discuss with Dr. Hetty Ely that although his renal      function is normal given his relatively low cardiac output it may      be reasonable to switch him off metformin on to sulfonylurea or      other agent.  4. Hyperlipidemia, well controlled.  5. Coronary artery disease.  This is mild, nonobstructive.  Continue      current therapy.   DISPOSITION:  I will see him back in 1 week.     [name], MD  Electronically Signed    DRB/MedQ  DD: 09/07/2006  DT: 09/07/2006  Job #: [id number]   cc:   Arta Silence, MD

## 2010-08-26 NOTE — Discharge Summary (Signed)
NAMEBRODERICK, FONSECA             ACCOUNT NO.:  [id number]   MEDICAL RECORD NO.:  [id number]          PATIENT TYPE:  INP   LOCATION:  2007                         FACILITY:  [facility]   PHYSICIAN:  [name], MDDATE OF BIRTH:  [birth year]   DATE OF ADMISSION:  11/25/2006  DATE OF DISCHARGE:  12/01/2006                               DISCHARGE SUMMARY   DISCHARGE DIAGNOSES:  1. Stage IV systolic congestive heart failure, acute on chronic.  2. Dizziness associated with #1.  3. Chronic atrial fibrillation without anticoagulation therapy with      Coumadin secondary to gastrointestinal bleed.  4. Anemia.  5. Hypotension.  6. History of diabetes.  7. Hyperlipidemia.  8. Ischemic cardiomyopathy.   SUMMARY OF HISTORY:  Mr. Galik is a 77 year old white male who is  well-known to Dr. Barrett Shell practice.  He has been followed closely for  dilated cardiomyopathy with an EF of 20-25% associated with congestive  heart failure, moderate to severe MR, COPD, and chronic atrial  fibrillation status post AV node ablation and bi-V pacer.  He continued  to worsen from a functional standpoint despite a bi-V device.  The  device was actually turned off last week and it did not improve his  symptoms.  He gets short of breath with any activity, develops  lightheadedness and hypotension.  Lasix was recently decreased but his  symptoms continued.  The patient was admitted to the hospital for  further treatment.   LABORATORY DATA:  Admission H&H was 9.9 and 29.1, normal indices,  platelets 272, WBC 10.6.  Prior to discharge essentially unchanged.  Admission sodium was 134, potassium 5.0, BUN 27, creatinine 1.36.  Normal LFTs.  Prior to discharge on the 20th, sodium was 138, potassium  4.2, BUN 21, creatinine 1.22, glucose 107.  BNP on admission was 101.  TSH 1.58.  Chest x-ray on the 14th showed PICC line in the lower SVC.   HOSPITAL COURSE:  Mr. Digilio was admitted to [facility]  for  additional treatment of his heart failure.  Orthostatics were obtained  and did not reveal any changes despite slight dizziness with standing.  Medications were adjusted.  The patient continued to feel dizzy despite  starting milrinone.  Dr. Garth Schlatter did not feel there was any benefit  after two days of milrinone.  This was changed to dobutamine.  After  another 24 hours, minimal-dose dobutamine was not having any effect;  thus, this was increased to 5 mcg/kg.  Activity was increased.  He  continued to remain lightheaded.  He received a 500 normal saline bolus.  The patient states that he felt better after receiving IV fluid.  Dr.  Gala Romney was concerned that his weakness and dizziness may be related  to volume depletion as well as heart failure or volume depletion alone.  His Lasix was held.  He also suspected chronotropic incompetence would  be playing a role.  Progression nurse assisted with education and  ambulation.  Barbara Cower interrogated his Medtronic device and increased his  base rate to 85 per Dr. Gala Romney.  [facility] was  contacted for  discharge needs.  Dr. Jens Som noted the patient had not  been started on Coumadin given his history of GI bleed.  Loura Pardon saw  the patient on November 30, 2006, in regard to his cardiomyopathy.  Dr.  Gala Romney on the 20th felt that the patient could be discharged home;  however, Nadine Counts spoke with Dr. Graciela Husbands via the phone and felt that his pacer  should be adjusted to a heart rate of 75 prior to discharge.  At the  time of discharge Dr. Gala Romney will make a decision if his potassium  should be resumed as this has been currently on hold.   DISPOSITION:  The patient is discharged home.  Asked to maintain a low-  sodium, heart-healthy ADA diet.  Wound care is not applicable.  Activities are not restricted.   His Lasix was decreased to 40 mg daily.  He was asked to continue his:  1. Aspirin 325 mg daily.  2. Amaryl 4 mg daily.   3. Protonix 40 mg daily.  4. Lisinopril 20 mg daily.  5. Spiriva 18 mcg daily.  6. Coreg 12.5 mg b.i.d.  7. Spironolactone 25 mg daily.  8. Digoxin 0.25 mg daily.  9. Iron 325 mg t.i.d.  10.Lipitor 20 mg q.h.s.  11.Flomax 0.4 mg daily.   He was asked to bring all weights and all medications to all  appointments.  He will follow up with Dr. Gala Romney in the [facility]  office on August 26 at 11:45.  He will keep his pacer check with Dr.  Graciela Husbands on September 23 at 9 a.m.  Prior to discharge Dr. Gala Romney will  decide on potassium supplementation and his pacemaker will be turned  down to a rate of 75.   DISCHARGE TIME:  45 minutes.      Joellyn Rued, PA-C      [name], MD  Electronically Signed    EW/MEDQ  D:  12/01/2006  T:  12/01/2006  Job:  [id number]   cc:   Arvilla Meres, MD  Duke Salvia, MD, [facility]

## 2010-08-26 NOTE — Assessment & Plan Note (Signed)
[facility]                         ELECTROPHYSIOLOGY OFFICE NOTE   HASNAIN, MANHEIM                    MRN:          [id number]  DATE:11/18/2006                            DOB:          03-17-1931    HISTORY OF PRESENT ILLNESS:  Mr. Pitkin was seen in the clinic today  at the request of Dr. Gala Romney, who saw him yesterday on November 17, 2006  and he was requesting that I turn off his LV lead.   PACEMAKER INTERROGATION:  On interrogation of his device today, his  battery voltage is 3.06 with a charge time of 8.19 seconds. R-waves were  not measured. He is dependent to a rate of 30 with a right ventricular  pacing threshold of 1 volt at 0.2 milliseconds and a right ventricular  lead impedance of 888 to 920 Ohms with a left ventricular lead impedance  of 448 Ohms and a left ventricular pacing threshold of 2 volts at 0.8  milliseconds. Shock impedance was 45. There were 39 VF episodes, all no  therapy programmed, that appeared to be atrial fibrillation with a rapid  ventricular response. On the quick-look screen, message had come up that  there were 2,222 V to V intervals, that had been sensed at a 120 or 130  milliseconds since September 16, 2006 and there was possible sensing issues or  double counting of the R-waves or lead fracture or loose set screw. The  impedances, when we checked him back in May for his wound check on the  RV, was 632 and LV was 400. RV has increased some today but his  threshold was stable. There was no noise on the lead and we looked at  different configurations on the lead. We looked at RV tip to RV ring and  also RV coil to SVC and all the tracings were clean. Also had him do so  isometric exercises and could not reproduce any noise at all. I did  indeed, decrease his LV output to 0.5 at 0.03, as Dr. Gala Romney had  asked and he has a return office visit already set up for January 04, 2007, to see Dr. Graciela Husbands and will see  what kind of numbers we get in the  interim.      Altha Harm, LPN  Electronically Signed      Duke Salvia, MD, [facility]  Electronically Signed   PO/MedQ  DD: 11/18/2006  DT: 11/19/2006  Job #: [id number]

## 2010-08-26 NOTE — Assessment & Plan Note (Signed)
[facility] OFFICE NOTE   AURON, TADROS                    MRN:          [id number]  DATE:05/25/2007                            DOB:          12/30/1930    PRIMARY CARE PHYSICIAN:  Arta Silence, MD   INTERVAL HISTORY:  Mr. Ploch is a very pleasant 77 year old male with  a history of severe congestive heart failure secondary nonischemic  cardiomyopathy with ejection fraction at 20-25%.  He also has mitral  regurgitation, chronic atrial fibrillation status post AV node ablation  with biventricular pacemaker, diabetes, mild COPD, anemia and obesity.  He returns today for routine followup.   From a heart failure point of view, he is fairly stable.  He does get  short of breath with mild to moderate activity at a class III level.  However, his really main complaint is severe arthritis in his back,  knees and feet.  It really is very life limiting to him.  He has not had  any orthopnea, no PND.  Every time he does get a little lower extremity  edema he takes an extra dose of Lasix and it goes away quickly.   CURRENT MEDICATIONS:  1. Aspirin 325.  2. Spiriva 18 mcg.  3. Amaryl 4 mg.  4. Protonix 40 mg.  5. Iron 324 two tablets in the morning and one at night.  6. Flomax of 0.4 mg a day.  7. Lipitor 20 at night.  8. Spirolactone 25 a day.  9. Lasix 40 mg a day on occasion 80.  10.Coreg 6.25 b.i.d.  11.Digoxin  0.125 a day.  12.Lisinopril 10 mg b.i.d.   PHYSICAL EXAM:  He is an elderly male in no acute distress.  Ambulates  around the clinical without any respiratory difficulty.  Blood pressure  is 98/56, heart rate is 71.  Weight is 221.  HEENT is normal.  Neck is supple.  No JVD, carotids are 1+ bilaterally without bruits, no  lymphadenopathy, thyromegaly.  CARDIAC:  PMI is nonpalpable.  Very  distant heart sounds.  He is mildly irregular and unable to detect any  murmurs, rubs or  gallops.  LUNGS:  Clear with mildly decreased breath sounds throughout.  ABDOMEN: Is obese, nontender, nondistended.  No hepatosplenomegaly. No  bruits, no masses appreciated.  Good bowel sounds.  EXTREMITIES:  Warm, no cyanosis, clubbing or edema.  No rash.  NEURO:  He is alert and oriented x3.  Cranial nerves II-XII are intact.  Moves all four extremities without difficulty.  Affect is pleasant.   ASSESSMENT/PLAN:  1. Congestive heart failure secondary nonischemic cardiomyopathy -      Volume status looks good.  He continues to be New York Heart      Association Class III.  His last echocardiogram was about a year      ago and showed mild worsening of his left ventricular function.      Will check another echocardiogram to make sure this is stable.  His      medication titration is limited due to symptomatic hypotension.  2. Atrial fibrillation.  Status post AV node ablation.  HE IS      INTOLERANT TO COUMADIN SECONDARY TO ANEMIA AND GASTROINTESTINAL      BLEEDING.  Continue full dose aspirin.  3. Hyperlipidemia.  Most recent LDL was at 70 which is at goal.   Overall, he is doing well.  Continue current therapy and will see him  back in a couple months.  I did tell him that if  he continues to have  problems with arthritis he should follow-up with an orthopedist and if  he needs surgery we were happy to help him get through it.     [name], MD  Electronically Signed    DRB/MedQ  DD: 05/25/2007  DT: 05/26/2007  Job #: [id number]   cc:   Arta Silence, MD

## 2010-08-26 NOTE — Discharge Summary (Signed)
[name], [name]             ACCOUNT NO.:  [id number]   MEDICAL RECORD NO.:  [id number]          PATIENT TYPE:  INP   LOCATION:  2007                         FACILITY:  [facility]   PHYSICIAN:  [name], MDDATE OF BIRTH:  1930-07-01   DATE OF ADMISSION:  11/25/2006  DATE OF DISCHARGE:  12/04/2006                               DISCHARGE SUMMARY   ALLERGIES:  This patient has NO KNOWN DRUG ALLERGIES.   Dictation time greater than 35 minutes.   FINAL DIAGNOSES.:  1. Acute on chronic New York Heart Association Class IIIB chronic      systolic congestive heart failure.  2. Difficult recovery from decompensated congestive heart failure,      requiring aggressive diuresis and dobutamine IV anatropic support.  3. Nonischemic cardiomyopathy, ejection fraction is 20-25% by      echocardiogram March 2008.  4. Moderate to severe mitral regurgitation.  5. Right ventricular lead malfunction discharging day one status post      repositioning of the RV lead and securing of header, and, LV lead      repositioning Dr. Sherryl Manges.  6. Left ventricular lead is off at time of discharge.  7. The patient device set to VVIR  this admission.  8. Pacemaker base rate 75 beats per minute, however patient is D      pacing at 60 beats per minute at discharge.   SECONDARY DIAGNOSES:  1. Left heart catheterization October 2007 in a setting of      decompensated congestive heart failure.  The finding of      nonobstructive coronary artery disease.  2. Atrial fibrillation, failed Coumadin secondary to history of GI      bleeds.      a.     Status post AV node ablation May 2008.  3. Status post implant Bi-V ICD May 2008.  4. History of GI bleeds requiring transfusions.  5. Hypertension.  6. Left bundle branch block  7. Diabetes  8. Benign prostatic hypertrophy  9. Anemia,  10.Lifelong tobacco habituation having quit in October 2007  11.The patient the patient is pacer dependent.   PROCEDURE:  On December 03, 2006, repositioning of the RV lead with  securing of the header, also left ventricular lead repositioning Dr.  Sherryl Manges.   The patient is counseled to keep his incision dry for next 7 days and to  sponge bathe until Friday August 29.  He is asked also to limit mobility  of his left arm for the next 4 days.      Maple Mirza, PA      [name], MD  Electronically Signed    GM/MEDQ  D:  12/03/2006  T:  12/05/2006  Job:  [id number]   cc:   Arta Silence, MD  [name], MD  Duke Salvia, MD, [facility]

## 2010-08-26 NOTE — Assessment & Plan Note (Signed)
[facility] OFFICE NOTE   XUE, LOW                    MRN:          [id number]  DATE:04/26/2008                            DOB:          Feb 13, 1931    PRIMARY CARE PHYSICIAN:  Arta Silence, MD   INTERVAL HISTORY:  [name] is a very pleasant 77 year old male with  a history of severe congestive heart failure secondary to nonischemic  cardiomyopathy, previous ejection fraction was 20-25%.  However, most  recent echocardiogram in February 2009, EF was 55%.  He also has chronic  atrial fibrillation, status post AV node ablation with biventricular  pacemaker, mitral regurgitation, diabetes, mild COPD, anemia, obesity,  and hypertension, who returns today for routine followup.   Overall, he says over the past month or 2, he has been doing really  well.  He does have some chronic dyspnea, but this is unchanged.  He has  not had any chest pain.  His swelling has been well controlled on Lasix.  He does note that his blood pressure has been increasing recently and he  has had pressures in the 160-170 range with diastolics in 90s to 100s.  He has increased his Coreg.  He does have occasional headache, but no  other focal neurologic symptoms.  He denies any orthopnea, no PND.  No  palpitations.   REVIEW OF SYSTEMS:  Remainder review of systems is negative except for  HPI and problem list.   CURRENT MEDICATIONS:  1. Aspirin 325 b.i.d.  2. Spiriva 18 mcg a day.  3. Amaryl 4 mg a day.  4. Protonix 40 mg a day.  5. Iron 325 t.i.d.  6. Flomax 0.4 a day.  7. Lipitor 20 a day.  8. Spironolactone 25 a day.  9. Lasix 40 a day.  10.Coreg 12.5 b.i.d.  11.Digoxin 0.125 a day.  12.Lisinopril 10 a day.  13.Eplerenone 25 a day.  14.Glimepiride 4 mg a day.  15.Xopenex p.r.n.   PHYSICAL EXAMINATION:  GENERAL:  He is in no acute distress.  He  ambulates around the clinic without any respiratory  difficulty.  VITAL SIGNS:  Blood pressure is 160/78, weight is 212, and heart rate is  71.  HEENT:  Normal.  NECK:  Supple.  No JVD.  Carotids are 1+ bilaterally with no bruits.  There is no lymphadenopathy or thyromegaly.  CARDIAC:  PMI is nonpalpable.  He has distant heart sounds.  He is  regular with 2/6 systolic ejection murmur at the right sternal border.  S2 is preserved.  LUNGS:  Clear with mildly decreased breath sounds throughout.  ABDOMEN:  Obese, nontender, and nondistended.  No obvious  hepatosplenomegaly.  No bruits.  No masses.  Good bowel sounds.  EXTREMITIES:  Warm with no cyanosis, clubbing, or edema.  No rash.  NEUROLOGIC:  Alert and oriented x3.  Cranial nerves II through XII  intact.  Moves all 4 extremities without difficulty.  Affect is  pleasant.   EKG shows atrial fibrillation with ventricular pacing at 71.   ASSESSMENT AND PLAN:  1. Congestive heart failure secondary to  nonischemic cardiomyopathy      with now recovered ejection fraction.  He is doing quite well.  He      is New York Heart Association class III with good volume status.      He is on good medications.  We are not titrating up his lisinopril      given his previous history of hyperkalemia.  2. Chronic atrial fibrillation.  He is status post atrioventricular      node ablation and biventricular pacemaker.  He is stable.  He is      not a Coumadin candidate due to previous severe life-threatening      gastrointestinal bleed.  Continue aspirin.  3. Hypertension.  We will add Norvasc 2.5 and check an ultrasound to      rule out renal artery stenosis.  4. Hyperlipidemia.  Goal LDL is less than 70.  We will check a lipid      panel today and forward to Dr. Hetty Ely as well.   DISPOSITION:  We will see him back in 4 months routine followup.     [name], MD  Electronically Signed    DRB/MedQ  DD: 04/26/2008  DT: 04/27/2008  Job #: [id number]

## 2010-08-26 NOTE — Discharge Summary (Signed)
NAMEARVINE, [name]             ACCOUNT NO.:  [id number]   MEDICAL RECORD NO.:  [id number]          PATIENT TYPE:  INP   LOCATION:  4707                         FACILITY:  [facility]   PHYSICIAN:  Maple Mirza, PA   DATE OF BIRTH:  05/27/1930   DATE OF ADMISSION:  08/24/2006  DATE OF DISCHARGE:  08/26/2006                               DISCHARGE SUMMARY   This dictation greater than 35 minutes.   FINAL DIAGNOSES:  1. Nonischemic cardiomyopathy, ejection fraction 20-25%      a.     Moderate to severe mitral regurgitation.      b.     New York Heart Association class III, chronic, systolic       congestive heart failure.      c.     Cardiopulmonary stress study demonstrating both cardiac and       pulmonary limitations.      d.     Left bundle branch block with paced QRS about 120       milliseconds.  2. Discharging day number 2 status post implant, [employer] InSync      Sentry   CRT-D.  1. Discharging day number 1, status post AV node ablation.  2. Diaphragmatic stimulation after BiV ICD implant   with threshold voltage in the left ventricular lead adjusted at the AV  node ablation procedure (the patient has had waning symptoms of  diaphragmatic stimulation and for the last 12 hours has been free of  diaphragmatic stimulation)..   SECONDARY DIAGNOSES:  1. Atrial fibrillation, not A Coumadin candidate secondary to GI      bleed, on anticoagulation.  2. Chronic obstructive pulmonary disease.  History of tobacco      habituation.  3. Benign prostatic hypertrophy.  4. Anemia.  5. Arthritis.  6. Borderline diabetes.   PROCEDURE:  1. 08/24/2006, implant of [employer] InSync Sentry cardioverter   defibrillator with a left ventricular pacing lead, Dr. Sherryl Manges.  There was diaphragmatic stimulation after the procedure.  This has been  adjusted by [employer] technical personnel and his symptoms have wean and  resolved.  1. 08/25/2006, AV node ablation, Dr. Lewayne Bunting.   The patient has      had no post-procedural complications.   PLAN:  For the pacing frequency to be adjusted as an outpatient at the  ICD clinic.   BRIEF HISTORY:  The patient is a 77 year old male.  He has a history of  congestive heart failure with persistent New York Heart Association  class III symptoms.  He gets short of breath after walking 50-100 feet.  By catheterization, he has a nonischemic cardiomyopathy.  His ejection  fraction is 20-25%.  He has moderate to severe mitral regurgitation.  He  has had a cardiopulmonary stress study which showed both cardiac and  respiratory limitations.  The patient has COPD from a lifelong history  of smoking.  The patient has also atrial fibrillation which denies the  patient the benefits of atrial kick.  He is not on Coumadin secondary to  GI bleed on Coumadin.  BiV ICD is appropriate therapy at this  point and  perhaps might reduce mitral regurgitation.  There is also data to  suggest that A-V junction ablation will benefit the patient's or  recipients of cardiac resynchronization therapy with atrial  fibrillation.  It looks as if his atrial fibrillation will be a  permanent thing since the patient is not a candidate for anticoagulation  or cardioversion.  The risks and benefits of implantation of device as  well as possible A-V node ablation have been discussed with the patient  and his family and they wish to proceed.   HOSPITAL COURSE:  The patient presented electively on 08/24/2006.  He  underwent implantation of Med[employer]onic InSync Sentry cardioverter  defibrillator with left ventricular lead placed.  He did have  diaphragmatic stimulation which the patient described as quite hard  heart beats.  They originated in the left lower quadrant and seemed to  rotate around to the back.  It kept him up all night the night after the  procedure.  Time constraints limited further work on 05/13.  The patient was seen by  Dr. Ave Filter on 05/14 and  scheduled for AV node ablation.  At that time,  the fact that the patient was diaphragmatically  stimulated, was assessed and tech reps in the electrophysiology lab  adjusted the left ventricular threshold.  The patient tolerated the AV  node ablation procedure well and in the post-procedure period, the  patient did have persistent diaphragmatic stimulation although he  describes it as much more relaxed in the heart beats from before.  These  have always been intermittent, based on changes in the patient's  position.  In the overnight period after ablation from 05/14-05/15  the patient noted that his diaphragmatic stimulation had actually ceased  and he felt wonderful.  The patient's incision has been inspected and it  shows it is healing nicely without hematoma.  Chest X-ray shows no  evidence of pneumothorax, and the leads are in appropriate position.  The device has been interrogated showing all values within normal  limits.  The patient is discharged on 05/15 on the following  medications:   1. Spiriva 18 mcg per inhalation:  One inhalation at dinner time      daily.  2. Xopenex nebulizer therapy every 6 hours at 0.63 mg per nebulizer.  3. Potassium chloride 20 mEq daily.  4. Protonix 40 mg daily.  5. Coreg 12.5 mg twice daily.  6. Iron 65 mg 3 times daily.  7. Flomax 0.4 mg 2 tabs daily.  8. Lipitor 20 mg daily at bedtime.  9. Lisinopril 20 mg daily.  10.Furosemide 80 mg daily.  11.Digitek 0.125 mg daily.  12.Enteric-coated aspirin 325 mg daily.  13.Metformin 500 mg twice daily.  He is asked to hold off on taking      this until Friday 05/16.   Mobility of the left arm has been discussed with the patient.  He  understands he is to keep it still quiet by his side for the next 4  days.  He is asked not to bathe for the next 7 days, to keep his  incision dry until Tuesday, 05/20.  The patient is asked not to drive for the next week.  He has follow-up appointments:   1. Dr.  Gala Romney at [facility] office, [facility], Tuesday      05/27 at 10:15.  2. ICD clinic at [facility], [address]      [place], Wednesday 05/28 at 9:20.  3. Dr.  Graciela Husbands at [facility], [address]      [place], Tuesday, 09/23 at 9:00.   Laboratory studies pertinent to this admission were drawn 08/17/2006.  Complete blood count, white cells are 7.7, hemoglobin 10.4, hematocrit  30.9 and platelets of 278.  Protime is 11.8, INR 1.0.  Sodium 140,  potassium 4.1, chloride 104, carbonate 29, glucose is 156, BUN is 25,  creatinine 1.2.      Maple Mirza, PA     GM/MEDQ  D:  08/26/2006  T:  08/26/2006  Job:  [id number]   cc:   Arta Silence, MD  Kerby Nora, MD  Duke Salvia, MD, [facility]  [name], MD

## 2010-08-26 NOTE — Discharge Summary (Signed)
[name], [name]             ACCOUNT NO.:  [id number]   MEDICAL RECORD NO.:  [id number]          PATIENT TYPE:  INP   LOCATION:  2007                         FACILITY:  [facility]   PHYSICIAN:  Joellyn Rued, PA-C     DATE OF BIRTH:  04-Oct-1930   DATE OF ADMISSION:  11/25/2006  DATE OF DISCHARGE:                         DISCHARGE SUMMARY - REFERRING   DISCHARGE PHYSICIAN:  Dr. Myrtis Ser.   DISCHARGE DIAGNOSES:  1. Stage 4 systolic congestive heart failure, acute on chronic.  2. Dizziness associated with chronic atrial fibrillation without      anticoagulation therapy secondary to GI bleed on Coumadin.  3. Anemia.  4. Hypertension.  5. History of diabetes.  6. Hyperlipidemia.  7. Ischemic cardiomyopathy.  8. Pacemaker lead failure requiring revision.   PROCEDURES PERFORMED:  Pacer lead revision on December 03, 2006 by Dr.  Graciela Husbands.   Patient was not discharged home on August 20 as planned.  After review,  Dr. Graciela Husbands felt that an ICD lead had malfunctioned.  This either  represented a loose screw of a fractured lead.  He felt that he needed  revision, that the patient is pacer dependent.   SUBSEQUENT LABORATORY DATA:  Chest x-ray on August 20 showed ICD  biventricular pacer unchanged, chronic small right pleural effusion  versus pleural scarring.  No active disease.  EKG on August 22 showed  right arm PICC line is now in the right subclavian vein.  On August 23  prior to discharge, H&H was 9.4 and 28.3, normal indices, platelets 285,  WBC is 12.4.  On August 21, PT was 14.1.   HOSPITAL COURSE:  Patient remained in the hospital on telemetry and was  followed closely by Loura Pardon, PA-C, and Dr. Sherryl Manges.  Dr. Graciela Husbands  performed RV lead repositioning and securing of lead and LV lead  repositioning.  He noted that LV lead was turned off, but could be tried  back on a low blank.  Nursing noted that patient was a source of blood  exposure to staff member; thus, blood exposure labs were  drawn.  This  was discussed with the patient.  On August 23, repeat chest x-ray did  not show any pneumothorax.  Dr. Graciela Husbands reviewed patient's chart.  He felt  that the patient could be discharged home; however, with decreased  hemoglobin, he would like that rechecked prior to making a decision for  discharge.  At this time, PICC line will remain in until blood results  are back.  If H&H is stable, patient will be discharged home.  Prior to  his discharge, patient will have a CBC drawn.  These results will be  called to me.  If CBC remains stable, we will discharge the patient.  Patient will follow up for  device clinic check on September 4 at 3:30 p.m.  He will follow up with  Dr. Gala Romney on December 07, 2006 at 11:45.  His medications that were  dictated on the previous discharge summary have not changed.   DISCHARGE TIME:  Thirty-five minutes.      Joellyn Rued, PA-C     EW/MEDQ  D:  12/04/2006  T:  12/04/2006  Job:  [id number]   cc:   [name], MD  Duke Salvia, MD, [facility]

## 2010-08-26 NOTE — Assessment & Plan Note (Signed)
[facility] OFFICE NOTE   [name], [name]                    MRN:          [id number]  DATE:10/04/2006                            DOB:          [birth year]    PRIMARY CARE PHYSICIAN:  Dr. Laurita Quint   INTERVAL HISTORY:  [name] is a delightful 77 year old male with a  history of severe congestive heart failure secondary to nonischemic  cardiomyopathy with an EF of 20-25%.  He also has moderate to severe  mitral regurgitation, chronic atrial fibrillation status post recent A-V  node ablation and biventricular pacemaker, severe COPD and diabetes.  He  returns today for a routine followup.   He was seen in the emergency room last night for continued dizziness.  He says this is much worse whenever he moves his head or turns quickly.  He denies palpitations.  He has not had any problems with food overload;  however, he does note that he continues to feel weak and get short of  breath with mild to moderate activity.  He has not had a recent upper  respiratory tract infection.   CURRENT MEDICATIONS:  1. Meclizine 25 t.i.d.  2. Aspirin 325.  3. Spiriva 18 mcg daily.  4. Amaryl 4 mg daily.  5. Protonix 40 daily.  6. Lisinopril 20 daily.  7. Lasix 40 daily.  8. Digoxin 0.25 daily.  9. Coreg 18.75 b.i.d.  10.Iron.  11.Flomax 0.4 mg daily.  12.Lipitor 20.  13.Potassium is on hold since we recently started Spironolactone which      has been discontinued.   PHYSICAL EXAMINATION:  GENERAL:  He is a very pleasant man, walks around  the clinic slowly without any respiratory difficulty.  VITAL SIGNS:  Blood pressure is 102/56.  Heart rate is 82.  Weight is  212.  HEENT:  Normal.  NECK:  Supple.  JVP is flat.  Carotids are 2+ bilaterally without any  bruits.  There is no lymphadenopathy or thyromegaly.  CARDIAC:  Regular.  He has distant heart sounds.  No S3.  Defibrillator  site looks good.  LUNGS:   Clear with decreased air movement throughout.  ABDOMEN:  Obese, nontender, nondistended.  No hepatosplenomegaly, no  bruits, no masses.  Good bowel sounds.  EXTREMITIES:  Warm with no cyanosis, clubbing, or edema.  No rash.  NEUROLOGIC:  He is alert and oriented x3.  Cranial nerves 2-12 grossly  intact, moves all 4 extremities without difficulty.  Affect is pleasant.   ASSESSMENT/PLAN:  1. Dizziness.  I suspect this is vertigo.  He has been given a      prescription for Meclizine, and we will set him up with ENT for      possible stone repositioning if this does not improve.  2. Heart failure.  He continues to have New York Heart Association      class III symptoms.  We recently started Spironolactone, but this      was stopped, as we thought it might be contributing to his      dizziness.  It has had no effect.  We will plan on restarting this      once we get his vertigo straightened out.  He may also be a      candidate for the CardioMEMS pulmonary artery sensor in the future.  3. Atrial fibrillation.  He is status post arteriovenous node ablation      with bi-V pacemaker/ICD.   DISPOSITION:  We will see him back in 1 month for a routine followup.     [name], MD  Electronically Signed    DRB/MedQ  DD: 10/04/2006  DT: 10/04/2006  Job #: [id number]

## 2010-08-26 NOTE — Progress Notes (Signed)
[facility] OFFICE NOTE   Johnny, Diaz                    MRN:          [id number]  DATE:09/17/2008                            DOB:          05/31/1930    [name] is seen following CRT-D implantation with subsequent  normalization of his previous nonischemic cardiomyopathy.  He  subsequently had ventricular fibrillation.  His defibrillator has been  quiescent.  However, since we saw him in February and interrogation  demonstrates no intercurrent arrhythmias.   He also has complained of problems with progressive dry mouth and he is  down titrated on his own Lasix to 40 mg a day.   PHYSICAL EXAMINATION:  VITAL SIGNS:  Today his blood pressure was 92/60.  His weight was 196, which is down 12 pounds since February.  His blood  pressure is also down considerably.  LUNGS:  Clear.  NECK:  Veins were flat.  HEART:  Sounds were regular.  EXTREMITIES:  No edema.   Interrogation of his Medtronic ICD demonstrates a battery voltage of  2.69.  The RV impedance was 650, LV impedance of 504, and RA impedance  of 520.  High-voltage impedances were 43/52.  Battery voltage was 2.69.  Atrial fibrillation is permanent.  OptiVol is flat.  Heart rate  excursion is adequate.   IMPRESSION:  1. Nonischemic cardiomyopathy with subsequent normalization of left      ventricular function.  2. Status post cardiac resynchronization therapy defibrillator that is      associated with the above.  3. Recent intercurrent episode of appropriate therapy for ventricular      fibrillation.  4. Dry mouth, question related to over the results of diuresis.  5. Hypotension, question related to the above.   We will check a BMET today.  He may need further down titration of his  medications.   We will see him again in 3 months' time in the device clinic.  He will  follow up with Dr. Gala Romney as previously  scheduled.     Duke Salvia, MD, [facility]  Electronically Signed   SCK/MedQ  DD: 09/17/2008  DT: 09/18/2008  Job #: [id number]   cc:   Arta Silence, MD

## 2010-08-26 NOTE — H&P (Signed)
[name], [name]             ACCOUNT NO.:  [id number]   MEDICAL RECORD NO.:  [id number]           PATIENT TYPE:   LOCATION:                                 FACILITY:   PHYSICIAN:  [name], MDDATE OF BIRTH:  1930-08-20   DATE OF ADMISSION:  11/25/2006  DATE OF DISCHARGE:                              HISTORY & PHYSICAL   REASON FOR ADMISSION:  Chronic systolic heart failure with class IV  symptoms.   INTERVAL HISTORY:  [name] is a very pleasant 77 year old male with  a history of severe congestive heart failure secondary to nonischemic  cardiomyopathy with an EF of 20-25%.  He also has a moderate to severe  mitral regurgitation, mild COPD and chronic atrial fibrillation status  post AV node ablation and biventricular pacer.   We have been following him closely in heart failure for some time and he  continues to get worse from a functional standpoint.  Last time we saw  him he said that he thought his symptoms got much worse after the  placement of his biventricular device, so we actually turned it off last  week and he says it has not made him feel any better.  He says he gets  short of breath with any activity.  He also notes that occasionally he  gets very lightheadedness and takes his blood pressure and it is in the  70s.  We have recently decreased his Lasix but this has only helped  some.  He denies any orthopnea or PND, no chest pain.  He feels that his  abdomen is swelling.  He had a recent CPX test which showed a peak VO2  of 11.9 with a slope of 46.  Recent PFTs showed only mild COPD.   CURRENT MEDICATIONS:  1. Aspirin 325.  2. Spiriva 18.  3. Amaryl 4.  4. Protonix 40.  5. Lisinopril 20 a day.  6. Digoxin 0.25 a day.  7. Iron 325 t.i.d.  8. Flomax 4 a day.  9. Lipitor 20 a day.  10.Spironolactone 25 a day.  11.Potassium 20 a day.  12.Lasix 40 alternating with 80 a day.  13.Coreg 12.5 b.i.d.   ALLERGIES:  NO KNOWN DRUG ALLERGIES.   REVIEW OF SYSTEMS:  As per HPI and problem list, otherwise all systems  negative.   PAST MEDICAL HISTORY:  1. Severe congestive heart failure secondary to nonischemic      cardiomyopathy, EF 20-25%.      a.     Status post BiV ICD with biventricular function now turned       off.      b.     Right heart catheterization November 05, 2006.  Right atrial       pressure mean of 12, RV pressure 36/8, PA pressure 39/16 with a       mean of 28, wedge pressure was 20.  Fick cardiac output was 5       liters per minute, cardiac index was 2.4.  2. Moderate to severe mitral regurgitation.  3. Cardiac catheterization October 2007, showed mild nonobstructive  CAD.  4. Atrial fibrillation status post AV node ablation.  5. Iron-deficiency anemia with previous GI bleed.  6. Hypertension.  7. Hyperlipidemia.  8. Left bundle branch block.  9. Diabetes.   SOCIAL HISTORY:  He is retired.  He is married.  Previously smoked  cigarettes but has quit now for some time.  Denies any significant  alcohol use.   FAMILY HISTORY:  Noncontributory.   PHYSICAL EXAMINATION:  VITAL SIGNS:  Blood pressure 86/52, heart rate  82, weight 209.  GENERAL APPEARANCE:  An elderly man in no acute distress.  Walks around  the clinic slowly without any respiratory difficulties as he is  primarily using a wheelchair now.  HEENT:  Normal.  NECK:  Supple.  No JVD.  Carotids are 2+ bilaterally with soft bruit on  the right.  LUNGS:  Clear.  CARDIOVASCULAR:  Regular rate and rhythm with soft systolic ejection  murmur at the left sternal border.  No S3.  PMI is not palpable.  ABDOMEN:  Obese, nontender, nondistended, no hepatosplenomegaly, no  bruits, no masses, good bowel sounds.  EXTREMITIES:  Warm with no clubbing, cyanosis, or edema.  No rash.  NEUROLOGIC:  He is alert and oriented x3.  Cranial nerves II-XII intact.  Moves all four extremities without difficulty.  Affect is pleasant.   ASSESSMENT/PLAN:  Congestive  heart failure.  I suspect he is a bit on  the dry side and we will go ahead and hold his Lasix for another two  days and decrease to 40 a day.  However, the larger picture is that he  continues to do worse from a functional standpoint.  We had a long talk  about the options given his age.  I suspect he is not a good candidate  for a transplant but could be considered for an left ventricular assist  device but he is a bit reluctant about this understandably.  I think  what we will plan to do is admit him and try him on a course of  inotropic therapy with milrinone.  Given his borderline blood pressure,  I think it would be more prudent to do this in the hospital.  He will  have a peripherally inserted central catheter line placed and see how he  does.      [name], MD  Electronically Signed     DRB/MEDQ  D:  11/24/2006  T:  11/24/2006  Job:  [id number]

## 2010-08-26 NOTE — Assessment & Plan Note (Signed)
[facility]                         ELECTROPHYSIOLOGY OFFICE NOTE   KYLIN, DUBS                    MRN:          [id number]  DATE:09/08/2006                            DOB:          1931/04/08    [name] is seen today in the device clinic for follow-up of his  newly implanted Medtronic Sentry ICD.  His device was implanted on Aug 24, 2006, for nonischemic cardiomyopathy and chronic atrial  fibrillation.  Interrogation of his device demonstrates P-waves of 0.5  mV with an atrial impedance of 656 ohms and atrial threshold was not  obtained secondary to the patient being in atrial fibrillation.  His RV  amplitude was 12.2 mV with an impedance of 632 ohms and a threshold of 1  volt at 0.2 msec.  His RV shock impedance was 40 ohms with an SVC shock  impedance of 51 ohms.  His LV impedance was 400 ohms with a threshold of  2 volts at 0.7 msec.  His battery voltage was 3.19 volts with a charge  time of 8.19 seconds.  [name] was in atrial fibrillation and he is  pacemaker dependent secondary to an AV node ablation at time of device  implant.  His device demonstrates no arrhythmias that have been detected  since implant.  He is programmed for a VT1 zone of 171 beats per minute,  a fast VT zone of up to 250 beats per minute and a VF zone of 200 beats  per minute.  He is V pacing 98.4% of the time.  On presentation, he was  programmed VVI 80.  Today his rate response was turned on.  His lower  rate was left at 80 with an upward sensory of 110.  [name] has an  appointment September 2008, to return to the clinic to see Dr. Sherryl Manges.      Gypsy Balsam, RN,BSN  Electronically Signed      Duke Salvia, MD, [facility]  Electronically Signed   AS/MedQ  DD: 09/08/2006  DT: 09/08/2006  Job #: [id number]

## 2010-08-26 NOTE — Assessment & Plan Note (Signed)
[facility] OFFICE NOTE   [name], [name]                    MRN:          [id number]  DATE:03/30/2007                            DOB:          [birth year]    PRIMARY CARE PHYSICIAN:  Arta Silence, M.D.   INTERVAL HISTORY:  [name] is a delightful 77 year old male with a  history of severe congestive heart failure secondary to nonischemic  cardiomyopathy, ejection fraction of 20-25%.  He also has a history of  mitral regurgitation, chronic atrial fibrillation, status post AV node  ablation and biventricular pacemaker, diabetes, mild COPD, and anemia.  He returns today for routine followup.   Overall, he is doing fairly well.  His dizziness and hypotension have  resolved since we cut back some of his medications.  He does have  chronic dyspnea, but this is unchanged.  He feels that he is about as  good as he is going to get.  He denies any orthopnea.  No PND.  No lower  extremity edema.  He has been gaining some weight due to having a big  appetite.   CURRENT MEDICATIONS:  1. Aspirin 325.  2. Spiriva 18 mcg.  3. Amaryl 4 mg a day.  4. Protonix 40 a day.  5. Lisinopril 10 a day.  6. Iron 325 t.i.d.  7. Flomax 4 mg a day.  8. Lipitor 20 a day.  9. Spironolactone 25 a day.  10.Lasix 40 mg some days, 80 mg on alternating days.  11.Coreg 6.25 b.i.d.  12.Digoxin 0.125 a day.  13.Lisinopril 10 mg b.i.d. __________ .   PHYSICAL EXAMINATION:  He is an elderly male in no acute distress.  He  ambulates around the clinic without any respiratory difficulty.  Blood pressure is 100/60, heart rate 68, weight is 218, which is stable.  HEENT:  Normal.  NECK:  Supple.  No JVD.  Carotids are 1+ bilaterally without any bruits.  No lymphadenopathy or thyromegaly.  CARDIAC:  PMI is nonpalpable.  He has distant heart sounds.  He is  regular with no obvious murmurs, rubs or gallops.  LUNGS:  Clear with  mildly decreased breath sounds throughout.  ABDOMEN:  Obese, nontender, nondistended.  No hepatosplenomegaly.  No  bruits.  No masses appreciated.  Good bowel sounds.  EXTREMITIES:  Warm with no clubbing, cyanosis or edema.  NEURO:  He is alert and oriented x3.  Cranial nerves II-XII are intact.  Moves all four extremities without difficulty.  Affect is pleasant.   Total cholesterol is 117, HDL 24, LDL 70.  White count is 9.5,  hemoglobin 11.2, platelets 272.  Liver function tests are normal.  Digoxin is 0.9.   ASSESSMENT/PLAN:  1. Congestive heart failure secondary to nonischemic cardiomyopathy:      Volume status looks good.  He continues to be NYHA class III with      stable symptoms.  His medication titration has been limited due to      hypotension.  We will continue current therapy.  2. Coronary artery disease:  This is mild, nonobstructive.  Continue  current therapy.  3. Atrial fibrillation:  He is status post AV node ablation.  He is      intolerant to Coumadin secondary to anemia and gastrointestinal      bleeding.  Will continue full dose aspirin.  4. Hyperlipidemia:  Most recent LDL was 70, which is right at goal.  5. Anemia:  This has improved with iron supplementation.  Hemoglobin      now is 11.2.   DISPOSITION:  Overall, he is doing quite well.  We will continue current  therapy and see him back in two months.     [name], MD  Electronically Signed    DRB/MedQ  DD: 03/30/2007  DT: 03/30/2007  Job #: [id number]   cc:   Arta Silence, MD

## 2010-08-26 NOTE — Op Note (Signed)
[name], [name]             ACCOUNT NO.:  [id number]   MEDICAL RECORD NO.:  [id number]          PATIENT TYPE:  INP   LOCATION:  2899                         FACILITY:  [facility]   PHYSICIAN:  Duke Salvia, MD, FACCDATE OF BIRTH:  1930/11/12   DATE OF PROCEDURE:  08/24/2006  DATE OF DISCHARGE:                               OPERATIVE REPORT   Following obtaining informed consent, the patient was brought to the  electrophysiology laboratory and placed on the fluoroscopic table in the  supine position.  After routine prep and drape, lidocaine was  infiltrated under the prepectoral subclavicular region.  An incision was  made and carried down to the layer of the prepectoral fascia with  electrocautery and sharp dissection.  A pocket was formed similarly.  Hemostasis was obtained.   Thereafter, attention was turned to gaining access to the extrathoracic  left subclavian vein which was accomplished without difficulty and  without the aspiration of air or puncture of the artery.  Three separate  venipunctures were accomplished.  Guidewires were placed and retained,  and a 0 silk suture was placed around the 2 more caudal wires through a  sequentially.  9, 9.5 and 7 French sheath were placed through which were  passed sequentially a Medtronic 694765 cm active fixation dual-coil  defibrillator lead, serial #[id number] and Medtronic MB 2 coronary sinus  cannulation catheter and a Medtronic 1576 52 cm active fixation atrial  lead, serial #[id number].  Under fluoroscopic guidance, the RV lead was  manipulated to the right ventricular apex with a bipolar R wave of 12.6  mV with a pacing impedance of 854 ohms and a threshold of 0.9 volts at  0.5 msec current of threshold 1.4 mA.  There is no diaphragmatic pacing  at 10 volts.   The sleeve was secured to the prepectoral fascia and to the MB 2.  Through the MB 2 cannulation catheter was placed a Wholey wire which  allowed for ready  cannulation of the coronary sinus.  There turned out  to be a valve at about 2 cm into the vein and right that was a lateral  branch that had 135 degree takeoff with a subsequent 90-degree bend that  coursed out onto the lateral wall.  This was our target.  We ended up  using the Attain 1 and then it Attain 2 system; 90-degree failed but at  135 degrees, it was successful.  We ended up with the Attain 2 system  because of the secondary bends, and then a Medtronic 4193 88 cm passive  fixation unipolar LV lead serial #[id number] was maneuvered onto the  left ventricular free wall.  There was diaphragmatic stimulation over  the distal centimeter or two, and we brought the lead back and in its  final location, there no diaphragmatic stimulation.  This point was at  the juncture between the basilar and mid third.  The vein at this point  had a large deflection in it which seemed to house the proximal canter.  The distal canter was straightened and actually retroflexed, and it was  hoped that this position would help  hold this lead in position.  In this  location, the bipolar L wave was 2.5 with a pacing impedance of 603.  A  threshold initially of 1 volt at 0.5 msec, but there was retrograde  movement of the lead unfortunately after extraction of the delivery  system and the final threshold of 3.7 volts at 1 msec.  Current  threshold is 2.4 mA.  The inner cannulation catheter was removed.  The  outer cannulation catheter was left in placed, and the atrial lead was  placed.   The target was a right atrial appendage because of low amplitude  fibrillation wave, we targeted the right atrial free wall where the  bipolar average fibrillation wave was about 0.6 mV.  This sleeve was  then secured and then secured to the prepectoral fascia.  At this point,  the MB 2 cannulation catheter was removed resulting in the  aforementioned movement of the lead, and its final location numbers are  as noted  previously.   The LV lead was then secured to the prepectoral fascia.  The leads were  then attached to a Medtronic InSync Sentry [id number] ICD serial #[id number].  Through the device, a bipolar fibrillation wave was 0.4 with a pacing  impedance of 640.  The R wave was 60.4 with a pace impedance of 736 and  a threshold of 1 volt at 0.2.  The LV impedance was 440, and the  threshold was 4 volts at 0.8 msec.   With the acceptable parameters recorded, the leads and the pulse  generator are placed in the pocket, it having been previously irrigated  with antibiotic-containing saline solution.  The defibrillator was  secured to the prepectoral fascia, and the wound was then closed in 3  layers in the normal fashion.  The wound was washed, dried, and a  Benzoin and Steri-Strips dressing was applied.  Needle counts, sponge  counts, and instrument counts were correct at the end of the procedure  according to the staff.      Duke Salvia, MD, [facility]  Electronically Signed     SCK/MEDQ  D:  08/24/2006  T:  08/24/2006  Job:  [id number]   cc:   [name], MD  Electrophysiology Lab  [facility]

## 2010-08-26 NOTE — Cardiovascular Report (Signed)
NAMEJAKAVION, [name]             ACCOUNT NO.:  [id number]   MEDICAL RECORD NO.:  [id number]          PATIENT TYPE:  OIB   LOCATION:  NA                           FACILITY:  [facility]   PHYSICIAN:  [name], MDDATE OF BIRTH:  Sep 30, 1930   DATE OF PROCEDURE:  11/05/2006  DATE OF DISCHARGE:                            CARDIAC CATHETERIZATION   PRIMARY CARE PHYSICIAN:  Arta Silence, MD   PATIENT IDENTIFICATION:  [name] is a delightful, 77 year old male  with severe nonischemic cardiomyopathy.  He is status post BiV pacer.  He has been having progressive heart failure symptoms now with an NYHA  Class IIIB symptoms.  He is referred for right heart catheterization to  evaluate his filling pressures and also to see if he would be a  candidate for intravenous inotropic therapy.  This was done in the  outpatient catheterization laboratory.   PROCEDURE PERFORMED:  Right heart catheterization.   DESCRIPTION OF PROCEDURE:  The risks and indications of procedure were  discussed.  Consent was signed and placed in the chart.  A 7-French  venous sheath was placed in the right femoral vein and standard right  heart catheterization was done with a Swan-Ganz catheter.   There are no immediate complications.   Right atrial pressure with mean of 12, RV pressure 36/8, PA pressure  39/16 with a mean of 28.  Pulmonary capillary wedge pressure mean of 20.  Fick cardiac output is 5 L per minute.  Cardiac index 2.4 L/sq m per  minute.   ASSESSMENT:  Relatively normal filling pressures with preserved cardiac  output.   PLAN:  Continue current therapy.  Consider CardioMEMS implantable  pulmonary artery sensor for a tighter management of his fluid status.      [name], MD  Electronically Signed     DRB/MEDQ  D:  11/05/2006  T:  11/06/2006  Job:  [id number]   cc:   Arta Silence, MD

## 2010-08-26 NOTE — Assessment & Plan Note (Signed)
[facility] OFFICE NOTE   BLADE, SCHEFF                    MRN:          [id number]  DATE:10/22/2006                            DOB:          January 24, 1931    REASON FOR VISIT:  Evaluate patient with nonischemic cardiomyopathy and  dyspnea.   HISTORY OF PRESENT ILLNESS:  The patient is a pleasant, 77 year old  gentleman who is followed by Dr. Gala Romney for nonischemic  cardiomyopathy.  He has had Class III symptoms.  He presents with  continued dyspnea.  He had three bad days, but woke up this morning  actually feeling better.  This is coincident with him having taken an  extra Lasix yesterday after he saw Dr. Hetty Ely.  He also saw Dr.  Hetty Ely and had spironolactone increased from 12.5 mg to 25 mg a day.  He is not describing new PND or orthopnea.  He simply becomes fatigued  and dyspneic with mild activity (Class III).  He has been having some  fleeting, sharp chest discomforts, mid sternum.  He also has an  occasional thumping when he is out doing things.  He is not having any  of the significant discomfort he was having when he was having  diaphragmatic stimulation.  He had his device reprogrammed to alleviate  this.  He was having some severe dizziness when he saw Dr. Gala Romney a  few weeks ago.  He is still having some mild dizziness when he moves in  a certain way.  He did see Dr. Ezzard Standing and there was no therapy for this,  however, as stated, it does seem to be slightly improved.  He has not  had any presyncope or syncope.  He has had no device firings.   PAST MEDICAL HISTORY:  1. Nonischemic cardiomyopathy (EF 20-25%).  2. Moderately severe mitral regurgitation.  3. Chronic atrial fibrillation.  4. COPD.  5. Diabetes.  6. AV node ablation.  7. Status post biventricular pacemaker/ICD placement (Medtronic Sentry      [phone]).  8. Osteoarthritis.  9. Benign prostatic hypertrophy.  10.Thoracotomy for lung nodule.  11.Previous upper GI bleed.  12.Anemia.   ALLERGIES:  No known drug allergies.   MEDICATIONS:  1. Aspirin 325 mg daily.  2. Spiriva 18 mcg daily.  3. Amaryl 4 mg daily.  4. Protonix 40 mg daily.  5. Lisinopril 20 mg daily.  6. Furosemide 80 mg daily.  7. Digoxin 0.25 mg daily.  8. Coreg 37.5 mg b.i.d.  9. Iron.  10.Flomax.  11.Lipitor 20 mg nightly.  12.Spironolactone 25 mg daily.  13.Potassium 20 mEq daily.   REVIEW OF SYSTEMS:  As stated in the HPI and otherwise negative for all  other systems.   PHYSICAL EXAMINATION:  GENERAL:  The patient is no acute distress.  VITAL SIGNS:  Blood pressure 110/62, heart rate 80 and regular.  Weight  214 pounds.  HEENT:  Eyes unremarkable.  Pupils equal round and reactive to light.  Fundi not visualized.  Normal oropharynx.  Normal mucous membranes.  NECK:  No jugular venous distention at 45 degrees, carotid upstroke  brisk, symmetrical, no bruits, no thyromegaly.  LYMPHATICS:  No cervical, axillary or inguinal adenopathy.  LUNGS:  Decreased breath sounds without wheezing or crackles.  BACK:  No costovertebral angle tenderness.  CHEST:  Unremarkable.  HEART:  PMI not displaced or sustained.  S1, S2 within normal limits.  No S3, no S4, no clicks, rubs or murmurs.  ABDOMEN:  Obese, positive bowel sounds.  Normal in frequency and pitch.  No bruits.  No rebound or guarding.  No midline pulsatile masses.  No  hepatosplenomegaly or organomegaly.  SKIN:  No rashes.  EXTREMITIES:  2+ pulses, no edema, no cyanosis or clubbing.  NEUROLOGIC:  Grossly intact.   EKG shows atrial fibrillation with ventricular pacing, 100% capture.   ASSESSMENT/PLAN:  1. Cardiomyopathy.  The patient has Class III symptoms continued.  He      feels better today after he took an extra dose of 80 mg of Lasix      last night.  I instructed him about taking an extra 40 as needed      and letting us know if he is having to do this  frequently.  His      weights have been stable, but he might still take an extra dose if      he is particularly dyspneic.  I am going to get a BMET today.  He      did have his spironolactone increased yesterday.  He is due to have      labs done in about 2 weeks by Dr. Hetty Ely.  At this point, I will      make no other medication adjustments, but will have him come back      in about 10 days.  He is going to the beach and he was counseled      about being very careful about his salt intake, fluid intake and      watching is exposure to the sun.  Finally, I am going to look into      optimization of his biventricular device.  I know he has had made      changes to the device to get rid of diaphragmatic stimulation.  I      do not know if there is any optimization with echocardiographic      guidance that would be feasible.  2. Hypertension.  Blood pressure is well-controlled on medications as      listed.  3. Chronic obstructive pulmonary disease.  He will continue his      regimen as listed.  4. Anemia.  This is going to be followed by Dr. Hetty Ely soon.  5. Dizziness.  The patient is having less of this.  He will continue      on the meclizine.  6. Followup as above.     Rollene Rotunda, MD, [facility]  Electronically Signed    JH/MedQ  DD: 10/22/2006  DT: 10/22/2006  Job #: [id number]   cc:   Arta Silence, MD

## 2010-08-26 NOTE — Assessment & Plan Note (Signed)
[facility]                         ELECTROPHYSIOLOGY OFFICE NOTE   ROGELIO, WAYNICK                    MRN:          [id number]  DATE:12/16/2006                            DOB:          1930/09/29    Mr. Filley comes in at Dr. Prescott Gum request to see what we can do  about reprogramming his device.  He has end-stage cardiomyopathy.  We  initially implanted a CRT device.  The LV lead was turned on.  He felt  atrial fibrillation.  AV junction ablation was undertaken.  He then  developed inappropriate shocks in about three months, and it turned out  that he had a loose rate sense __________ .  This was fixed.  He  continues to fail to thrive.   At this point, we will active his left ventricular lead again.  His rate  is reprogrammed to 70.   PHYSICAL EXAMINATION:  His blood pressure today was 80/54.  LUNGS:  Clear.  CARDIAC:  Heart sounds were regular.   We will contact him tomorrow and decrease his lisinopril to 10 and his  carvedilol to 6.25 b.i.d., given his hypotension.  I should note that  when he saw Dr. Gala Romney in [place] last week, his blood pressure  was 92.     Duke Salvia, MD, [facility]  Electronically Signed    SCK/MedQ  DD: 12/16/2006  DT: 12/16/2006  Job #: [id number]

## 2010-08-26 NOTE — Progress Notes (Signed)
[facility] OFFICE [facility]   LINKON, SIVERSON                    MRN:          [id number]  DATE:12/21/2006                            DOB:          04/04/1931    Mr. Eastham has severe ischemic cardiomyopathy.  He was seen about a  week ago and, at that time, we reduced his blood pressure medications  from Coreg 12.5 to 6.25 and his Lasix from 80, alternating with 40, down  to 40 a day.  Also we have cut his lisinopril from 20 to 10.  This was  associated with a marked improvement in his overall symptoms.   I also reprogrammed his LV lead on, but he continues even at these supra-  threshold values, to have diaphragmatic stimulation.   EXAMINATION:  His blood pressure is 106/60, his pulse is 50.  His lungs  were clear.  Heart sounds were regular.  His extremities actually had no  edema.  I did not assess his neck veins.   Interrogation of his Medtronic sensory device demonstrated that he was  dependent with his atrial fibrillation that is permanent, following AV  junction ablation.  His LV thresholds are 2 volts at 1.1 and 2.5 volts  at 0.5, but even at 2.5 volts at 1.0 milliseconds, he had some  diaphragmatic stimulation, though, and interestingly, as I programmed LV  early or RV early, he ended up having more thumping with RV early, which  was somewhat not what I expected, versus LV early and his LV early was  actually better, so he is now programmed at LV 40 milliseconds prior to  RV.   I should note that his OptiVol index is high, but the first __________  it seems to be negative as it has started to slow down his acceleration.   IMPRESSION:  1. Ischemic cardiomyopathy.      a.     Depressed left ventricular function with EF of 20-25%.      b.     Congestive heart failure, Class II-B to III.  2. Status post CRT with AV junction ablation.  3. Permanent atrial fibrillation.  4. Left  ventricular stimulation resulting in diaphragmatic      stimulation, likely related to distal migration of the unipolar      lead.  5. Modest hypotension with significant improvement in symptoms      associated with down-titration of his medications and his blood      pressure now being just over 100.   Mr. Langille is doing much better.  We have tried to find an LV output  that is tolerable.  I outlined for him the options, being leaving it  where it is, turning it off, or surgically repositioning  it.  He would like to think about this.  He is to see Dr. Gala Romney in  about two weeks' time.  We  will plan to see him again as part of device followup and otherwise we  will defer to Dr. Gala Romney any further interventions.     Duke Salvia, MD, [facility]  Electronically Signed    SCK/MedQ  DD: 12/21/2006  DT: 12/22/2006  Job #: [id number]

## 2010-08-26 NOTE — H&P (Signed)
[name], [name]             ACCOUNT NO.:  [id number]   MEDICAL RECORD NO.:  [id number]          PATIENT TYPE:  INP   LOCATION:  4707                         FACILITY:  [facility]   PHYSICIAN:  Duke Salvia, MD, FACCDATE OF BIRTH:  December 05, 1930   DATE OF ADMISSION:  08/24/2006  DATE OF DISCHARGE:  08/26/2006                              HISTORY & PHYSICAL   He has no known drug allergies.   His medical practitioners are Dr. Gala Romney, [facility] at the  [facility] office, Dr. Sherryl Manges, [facility] at the  [facility] office, Dr. Laurita Quint and Dr. Kerby Nora.   PRESENTING CIRCUMSTANCE:  I am here for a defibrillator.   HISTORY OF PRESENT ILLNESS:  [name] is a 77 year old male.  He has  a history of congestive heart failure.  His New York Heart Association  heart failure class is Class III, chronic systolic heart failure.  He  gets short of breath after walking 50-100 feet.  He had a  catheterization which showed nonischemic cardiomyopathy.  His ejection  fraction is 20-25%.  He has moderate to severe mitral regurgitation.   He has had a cardiopulmonary stress study which shows both cardiac and  respiratory limitations.  The patient has COPD from lifelong history of  smoking.  The patient also has atrial fibrillation which also decreases  cardiac output.  He is not on Coumadin secondary to a GI bleed on  Coumadin previously.  He presents for cardiac resynchronization - D.  This may be appropriate therapy and may even perhaps reduce mitral  regurgitation.  There is also data to suggest that AE junction ablation  will benefit the patient who has cardiac resynchronization therapy with  atrial fibrillation.   The patient is probably going to have atrial fibrillation permanently.  He is not a candidate for anticoagulation or cardioversion.   The risks and benefits of device implantation as well as possible AV  node ablation have been discussed with the  patient, and they wished to  proceed.   MEDICATIONS:  1. Spiriva 18 mcg per inhalation 1 inhalation at dinner time daily.  2. Xopenex nebulizer therapy every 6 hours 0.63 mg per nebulizer.  3. Potassium chloride 20 mEq daily.  4. Protonix 40 mg daily.  5. Coreg 12.5 mg twice daily.  6. Iron 65 mg 3 times daily.  7. Flomax 0.4 mg 2 tablets daily.  8. Lipitor 20 mg daily at bedtime.  9. Lisinopril 20 mg daily.  10.Furosemide 80 mg daily.  11.Digitek 0.125 mg daily.  12.Enteric-coated aspirin 325 mg daily.  13.Metformin 500 mg twice daily.   PAST MEDICAL HISTORY:  1. Atrial fibrillation.  Not a Coumadin candidate secondary to GI      bleed anticoagulation.  2. Chronic obstructive pulmonary disease.  3. Benign prostatic hypertrophy.  4. Anemia.  5. Arthritis.  6. Borderline diabetes.  7. Nonischemic cardiomyopathy.  8. New York Heart Association Class III chronic systolic congestive      heart failure.  9. Left bundle branch block.   PRIOR STUDIES:  A 2-D echocardiogram was done on June 21, 2006,  ejection fraction estimated at 20-25% with akinesis of the inferoseptal  posterior and apical walls, hypokinesis elsewhere.  The patient has  moderate mitral valve regurgitation, trivial pulmonic regurgitation and  mild tricuspid regurgitation.   FAMILY HISTORY:  Is noncontributory.   REVIEW OF SYSTEMS:  The patient is not having fevers, chills.  He has  had no significant weight gain or loss the last 6 months.  The patient  is not having epistaxis, vertigo, diplopia, photophobia.  He has no  rashes or nonhealing ulcerations.  The patient has no history recently  of chest pain.  He is short of breath with exertion.  He has no  orthopnea or paroxysmal nocturnal dyspnea.  He has mild lower extremity  edema.  No palpitations.  No syncope or presyncope.  He has no lower  extremity claudication by history.  UROGENITAL:  He has no hematuria, no  frequency or urgency.  He does have  nocturia x2.  NEUROPSYCHIATRIC:  No  anxiety, depression, no fatigue or weakness.  MUSCULOSKELETAL:  No joint  effusions, no specific arthralgias, kyphosis or scoliosis.  He has no  polydipsia.  He is not heat or cold intolerant.   PHYSICAL EXAMINATION:  This is an alert and oriented gentleman in no  acute distress.  Temperature is 97.4, pulse is 92 and regular, respirations 20, blood  pressure 133/71, oxygen saturation 99% room air.  The patient weighs  91.8 kg.   IMPRESSION:  1. Nonischemic cardiomyopathy, left bundle branch block, moderate to      severe mitral regurgitation, New York Heart Association Class III      chronic systolic congestive heart failure.  2.  Atrial      fibrillation.  The patient presents for cardioverter defibrillator      with cardiac resynchronization lead plus/minus AV node ablation.      Maple Mirza, [place]      Duke Salvia, MD, [facility]  Electronically Signed    GM/MEDQ  D:  11/03/2006  T:  11/04/2006  Job:  [id number]

## 2010-08-26 NOTE — Assessment & Plan Note (Signed)
[facility] OFFICE NOTE   RUBY, DILONE                    MRN:          [id number]  DATE:11/17/2006                            DOB:          01-09-1931    [place] CARDIOLOGY CLINIC NOTE   PRIMARY CARE PHYSICIAN:  Arta Silence, M.D.   INTERVAL HISTORY:  [name] is a very pleasant 77 year old male with  severe congestive heart failure secondary to nonischemic cardiomyopathy  with an EF of 20-25%.  He also has moderate to severe mitral  regurgitation, chronic atrial fibrillation status post recent AV node  ablation, and biventricular pacemaker on Aug 24, 2006, as well as COPD,  diabetes.  He returns today for routine followup.  He recently underwent  right heart catheterization on July 25 for his heart failure.  Right  atrial pressure was 12, PA pressure was 39/16 with a mean of 28.  Pulmonary capillary wedge pressure was 20 with a thick cardiac output of  5L and a cardiac index of 2.4.  He says he continues to not feel well.  He gets short of breath with almost any activity.  He feels okay at  rest.  Over the past week or 2, he has noticed that he has been very  light-headed, especially when standing.  He has felt that his belly has  been bloating some, but his weight is down and his pants are fitting  better.  He feels like he just cannot go any more.  He denies any chest  pain.   CURRENT MEDICATIONS:  1. Aspirin 325.  2. Spiriva 18.  3. Amaryl 4.  4. Protonix 40.  5. Lisinopril 20.  6. Lasix 80 a day.  7. Digoxin 0.25 daily.  8. Coreg 27.5 b.i.d.  9. Iron.  10.Flomax 4.  11.Lipitor 20.  12.Spironolactone 25.  13.Potassium 20 a day.   PHYSICAL EXAM:  He is fatigued.  He is short of breath just on walking  back through the clinic.  He is somewhat light-headed.  Respirations are unlabored.  Blood pressure 78/50, pulse is 75, weight  is 209 down 6 pounds from previous.  HEENT:   Normal.  NECK:  Supple.  No JVD.  Carotids are 1+ bilaterally without bruits.  There is no lymphadenopathy or thyromegaly.  CARDIAC:  Distant heart sounds.  PMI is not palpable.  He has a regular  rate and rhythm.  No S3.  Soft ejection murmur at the apex.  LUNGS:  Decreased throughout without any rales or wheezes.  ABDOMEN:  Soft and nontender. Nondistended.  No hepatosplenomegaly.  No  bruits.  No masses appreciated.  EXTREMITIES:  Warm but no cyanosis, clubbing, or edema.  No rash.  NEURO:  He is alert and oriented x3.  Cranial nerves 2-12 are intact.  Moves all 4 extremities without difficulty.  Affect is normal.   ASSESSMENT AND PLAN:  Chronic systolic heart failure.  He continues to  have New York Heart Association class IIIB symptoms even in the setting  of relatively normal filling pressures a couple weeks ago.  Today, he  looks significantly volume depleted.  We will hold his Lasix for 2 days  and then put him on 40 mg alternating with 80 mg every day.  He feels  that, since his biventricular pacer has been placed, he has continued to  do much worse.  We had an over 30-minute discussion about the options,  including a trial of turning off his left ventricular lead, putting in a  CardioMEMS PA sensor device to optimize his heart failure, fluid status,  home inotropic therapy, and even discussed possible transplant/LVAD  evaluation at [facility].  At this point, he would like to try turning off his  left ventricular lead, although this will subject him to chronic right  ventricular pacing, but he is adamant that, prior to his  resynchronization, he felt much better.  I do not suspect that we will  get much benefit.  I will see him back in a week or 2 and then, if he is  not feeling better, we will re-discuss options versus home inotrope,  versus possible transplant evaluation, though his comorbidities make  this difficult.  Of note, we will also cut his Coreg back to 12.5 b.i.d.      [name], MD  Electronically Signed    DRB/MedQ  DD: 11/17/2006  DT: 11/17/2006  Job #: [id number]   cc:   Arta Silence, MD

## 2010-08-26 NOTE — Op Note (Signed)
NAMEKOLYN, ROZARIO             ACCOUNT NO.:  [id number]   MEDICAL RECORD NO.:  [id number]          PATIENT TYPE:  INP   LOCATION:  2007                         FACILITY:  [facility]   PHYSICIAN:  Duke Salvia, MD, FACCDATE OF BIRTH:  February 08, 1931   DATE OF PROCEDURE:  12/03/2006  DATE OF DISCHARGE:                               OPERATIVE REPORT   PREOPERATIVE DIAGNOSIS:  Inappropriate oversensing via the defibrillator  lead with false detection and __________ pacing in pacer dependent  patient.   POSTOPERATIVE DIAGNOSIS:  Loose RV rate sensing set screw; diaphragmatic stimulation via the LV  lead.   PROCEDURE:  Repositioning of the LV lead and securing of the RV lead.   Following obtaining of informed consent, the patient was brought to the  electrophysiology laboratory placed on the fluoroscopic table in supine  position.  After routine prep and drape of the left upper chest,  lidocaine was infiltrated along line of the previous incision carried  down to the layer of the device pocket using sharp dissection and  electrocautery.  The pocket was opened.  The device was freed up.  With  manipulation of the device, there was significant inhibition of  ventricular pacing.  The device was reprogrammed into the VOO mode.  With extraction of the device from the pocket, it was noted that the RV  rate sense screw was loose vis-a-vis the lead to the screw was further  loosened.  The lead was inserted and resecured and no further inhibition  was noted.   This having been done we then attempted to reposition in the left  ventricular lead.  The initial location had been with the cephalad turn  at its distal ramification.  I passed a Whisper wire through the lead  into this vein; however, I was not able to create enough forward thrust  over the lead to advance it.  I thus chose to retract it until we lost  diaphragmatic stimulation.  In this position the distal camber was  straightened,  proximal camber is in the coronary sinus body.  The QRS  with pacing narrowed about 50 milliseconds.  It was elected to leave the  lead here.  The lead was resecured to the prepectoral fascia.  The leads  were then attached back to the device and the pocket was copiously  irrigated with antibiotic containing saline solution and the wound was  closed in three layers in normal fashion.  Wound was washed, dried and a  benzoin Steri-Strip dressing was applied.  Needle count, sponge counts  and instrument counts were correct at the end of the procedure according  to staff; we had misplaced a sponge but we fluoroed the pocket.  It was  not there and the procedure was terminated.      Duke Salvia, MD, [facility]  Electronically Signed     SCK/MEDQ  D:  12/03/2006  T:  12/04/2006  Job:  [id number]   cc:   Electrophys lab  [place] pacemaker cl

## 2010-08-26 NOTE — Progress Notes (Signed)
[facility] OFFICE [facility]   SAAGAR, TORTORELLA                    MRN:          [id number]  DATE:07/02/2008                            DOB:          1930-09-14    [name] is seen in followup today for nonischemic cardiomyopathy,  status post AV junction ablation, and CRTD implantation.  He has  nonischemic myopathy and subsequent intercurrent normalization of his  left ventricular systolic function.   This subsequently confirmed by repeat echo in January.  He continues to  have shortness of breath.   He describes a syncopal episode that occurred on Veterans Day.  He got  lightheaded and then he awakened on the floor (see below).   MEDICATIONS:  His medications are reviewed and are notable for,  1. Coreg 12.5 b.i.d.  2. Colchicine 0.6.  3. Lisinopril 20.  4. Norvasc 2.5.  5. Eplerenone 25.  6. Digoxin 0.125.  7. Furosemide 40.  8. Lipitor 20.  9. Flomax 0.4.  10.Ferrous sulfate.  11.Protonix 40.  12.Amaryl.  13.Spiriva.  14.Aspirin.   PHYSICAL EXAMINATION:  VITAL SIGNS:  His blood pressure today was  165/87, his pulse was 80.  His weight was 207 which is down 20 pounds in  the last year.  LUNGS:  Clear.  HEART:  Sounds were regular.  ABDOMEN:  Soft and protuberant.  EXTREMITIES:  Had trace edema.   Interrogation of his Medtronic sensory ICD demonstrates no intrinsic  ventricular rhythm.  The atrial fibrillation wave was 0.3.  The RV  impedance was 536 with a threshold volt of 0.2.  The LV impedance was  400 and threshold 2.5 at 0.8.  The output was increased to accommodate  that.  There is an episode of ventricular fibrillation, on Veterans Day  that was properly diagnosed and appropriately treated with shock  therapy.  He had an episode on the day before Valentine's Day of  diverted ventricular fibrillation.   IMPRESSION:  1. Nonischemic cardiomyopathy with subsequent  normalization of left      ventricular ejection fraction.  2. Status post CRT-D for the above.  3. Status post atrioventricular junction ablation with permanent      atrial fibrillation for 2 episodes of ventricular fibrillation, 1      shock terminated, 1 spontaneously terminated in the last 4 months.   I am not quite sure why Mr. Codner had his VF.  He is not the first  patient, we have seen now, who has had VF in the context of  normalization of LV systolic function following a tachycardia-induced  cardiomyopathy and reminds [place] to be careful in our management of these  patients.  We will check his electrolytes today and the status should be  available for Dr. Gala Romney on review tomorrow.  Electromechanical  interactions and electrolytes are the 2 reversible things I suspect but  what we have unfortunately is just an abnormal substrate without an  identifiable trigger.   We will see him again in 3 months' time.     Duke Salvia, MD, [facility]  Electronically Signed    SCK/MedQ  DD: 07/02/2008  DT: 07/03/2008  Job #: [id number]

## 2010-08-26 NOTE — Assessment & Plan Note (Signed)
[facility] OFFICE NOTE   [name], [name]                    MRN:          [id number]  DATE:07/19/2007                            DOB:          [birth year]    PRIMARY CARE PHYSICIAN:  Dr. Laurita Quint.   INTERVAL HISTORY:  [name] is a very pleasant, 77 year old male  with a history of severe congestive heart failure secondary to  nonischemic cardiomyopathy with ejection fraction of 20-25%.  He also  has mitral regurgitation, chronic atrial fibrillation status post AV  node ablation with a biventricular pacemaker, diabetes, mild COPD,  anemia and obesity.  He  returns today for routine follow-up.   He really is unchanged.  He continues with class 3B dyspnea.  He gets  short of breath going to the mailbox and back. This is really unchanged.  He says sometimes he feels somewhat better and can go back and forth a  few times but then has to rest.  He has not had any chest pain. He does  have occasional lower extremity edema for which he takes an extra dose  of Lasix.  He also has severe arthritis which limits his mobility.  Finally he is complaining of some painful swelling around his breasts.   CURRENT MEDICATIONS:  1. Aspirin 325 a day.  2. Spiriva 18 mcg a day.  3. Amaryl 4 mg a day.  4. Protonix 40 a day.  5. Iron 325 t.i.d.  6. Flomax 0.4 a day.  7. Lipitor 20 a day.  8. Spirolactone 25 a day.  9. Lasix 40 mg a day, occasionally 80.  10.Coreg 6.25 b.i.d.  11.Digoxin 0.125 a day.  12.Lisinopril 10 mg b.i.d.   PHYSICAL EXAM:  He is an elderly male in no acute distress. He ambulates  around the clinic slowly without any respiratory difficulty.  Blood  pressure is 108/64, heart rate 78, weight 228 which is up another 7  pounds.  HEENT:  Normal.  NECK:  Supple.  There is no JVD. Carotids are 1+ bilaterally without any  bruits. There is no lymphadenopathy or thyromegaly.  CARDIAC:  PMI is  nonpalpable with very distant heart sounds. He is  regular with no murmurs, rubs or gallops appreciated.  LUNGS:  Clear with mildly decreased breath sounds throughout.  ABDOMEN:  Obese, nontender, nondistended, no hepatosplenomegaly.  No  bruits, no masses.  Good bowel sounds.  EXTREMITIES:  Warm, no cyanosis, clubbing or edema.  No rash.  NEURO:  Alert and oriented x3.  Cranial nerves II-XII are intact.  Moves  all four extremities without difficulty.  Affect is pleasant.   The most recent echocardiogram shows an ejection fraction of 55% with  trivial mitral regurgitation.  The RV was normal.   ASSESSMENT/PLAN:  1. Congestive heart failure due to nonischemic cardiomyopathy.  His EF      now appears to be normalized by his recent echo although I have a      hard time believing this as he has had quite a persistent      nonischemic myopathy.  I will  need to review his echo personally.      His volume status looks good. We will continue current therapy. We      have been unable to titrate his medications much due to      hypotension.  2. Severe dyspnea. I think this is multifactorial.  He does have a      history of chronic obstructive pulmonary disease. I think it may be      reasonable to consider some pulmonary rehab to see if this helps      him.  He would also benefit from some weight loss.  3. Hypertension.  Blood pressure looks okay today. Continue current      therapy.  4. Atrial fibrillation.  He is status post AV node ablation.  He is      intolerant of Coumadin due to GI bleeding.  Will continue aspirin.   DISPOSITION:  Will see him back in a couple of months for routine follow-  up.     [name], MD  Electronically Signed    DRB/MedQ  DD: 07/19/2007  DT: 07/19/2007  Job #: [id number]   cc:   Arta Silence, MD

## 2010-08-26 NOTE — Op Note (Signed)
NAMEHYATT, CAPOBIANCO             ACCOUNT NO.:  [id number]   MEDICAL RECORD NO.:  [id number]          PATIENT TYPE:  INP   LOCATION:  4707                         FACILITY:  [facility]   PHYSICIAN:  Doylene Canning. Ladona Ridgel, MD    DATE OF BIRTH:  Dec 08, 1930   DATE OF PROCEDURE:  08/24/2006  DATE OF DISCHARGE:                               OPERATIVE REPORT   Mr. Legate is referred today for AV node ablation by Dr. Graciela Husbands.   INTRODUCTION:  The patient is 74-year male with ischemic cardiomyopathy,  congestive heart failure, and left bundle branch block who is status  post BiV ICD insertion.  Because of his atrial fibrillation and rapid  ventricular response, he is now referred for AV node ablation.   PROCEDURE:  After informed consent was obtained, the patient was taken  diagnostic EP lab in the fasting state.  After the usual preparation, he  was placed in the supine position.  A 7-French quadripolar ablation  catheter was inserted percutaneously into the right femoral vein and  advanced to the His bundle region.  After mapping was carried out with  an HV interval of approximately 64 milliseconds demonstrated, 2 RF  energy applications were delivered to the AV node region resulting in  the development of complete heart block.  The patient tolerated the  procedure well and was returned to his room in satisfactory condition.   COMPLICATIONS:  There were no immediate procedure complications.   RESULTS:  This demonstrate successful AV node ablation in a patient with  prior BiV ICD insertion and atrial fibrillation with a rapid ventricular  response, class III congestive heart failure with left bundle branch  block.      Doylene Canning. Ladona Ridgel, MD  Electronically Signed     GWT/MEDQ  D:  08/25/2006  T:  08/25/2006  Job:  [id number]   cc:   Kerby Nora, MD  Arta Silence, MD

## 2010-08-26 NOTE — Assessment & Plan Note (Signed)
[facility] OFFICE NOTE   [name], [name]                    MRN:          [id number]  DATE:12/07/2006                            DOB:          [birth year]    [name] is a very pleasant 77 year old male with severe congestive  heart failure secondary to nonischemic cardiomyopathy with an EF of 20-  25%. He also has a history of mitral regurgitation, chronic atrial  fibrillation status post AV node ablation and biventricular pacemaker.  He recently was in the hospital for class IV heart failure symptoms  without significant volume overload. In fact, his filling pressures were  quite low, and we hydrated him. We also treated him with some  dobutamine, but he did notice much improvement. We finally discovered  that his rate response was off on his pacemaker. This was turned back  on, and his resting heart rate returned to 85, and he felt much better.  Unfortunately, he was found to have faulty defibrillator lead and had to  undergo a placement of this. Today, he returns for routine followup.   He says that continues to feel weak with no energy. We have cut his  Lasix back to 40 a day. He denies any chest pain. No significant lower  extremity edema.   CURRENT MEDICATIONS:  1. Aspirin 325 a day.  2. Spiriva 18 a day.  3. Amaryl 4 a day.  4. Protonix 40 a day.  5. Lisinopril 20 a day.  6. Digoxin 0.25 a day.  7. Iron t.i.d.  8. Flomax 4 mg a day.  9. Lipitor 20 a day.  10.Spironolactone 25 a day.  11.Potassium 20 a day.  12.Lasix 40 mg on most days and occasional 80 mg.  13.Coreg 12.5 b.i.d.   PHYSICAL EXAMINATION:  He is in no acute distress. He ambulates around  the clinic slowly without any respiratory difficulty. Blood pressure is  92/60. Heart rate is 65. Weight is 210 which is stable.  HEENT:  Is normal.  NECK:  Is supple. There is no JVD. Carotids are 2+ bilaterally without  bruits. There  is no lymphadenopathy or thyromegaly.  CHEST:  Defibrillator site has a small hematoma with no fluctuance or  wound dehiscence.  CARDIAC:  PMI is not palpable. He has distant heart sounds. He is  regular. No obvious murmurs.  LUNGS:  Are clear with mildly decreased breath sounds throughout.  ABDOMEN:  Is soft, nontender, nondistended. There is no  hepatosplenomegaly. No bruits. No masses. Good bowel sounds.  EXTREMITIES:  Are warm with no clubbing, cyanosis, or edema. No rash.  NEUROLOGICAL:  Alert and oriented x3. Cranial nerves II-XII are intact.  Moves all four extremities without difficulty. Affect is pleasant.   EKG shows underlying atrial fibrillation with ventricular pacing at a  rate of 65.   ASSESSMENT AND PLAN:  1. Congestive heart failure. His volume status looks good today.      However, he still has class IIIB symptoms. Once again, I think his      pacemaker rate may be set too low, and  we will turn it back up to      85 and see how he does. Otherwise, we will continue current      medications.  2. Atrial fibrillation. He is intolerant of Coumadin due to anemia and      gastrointestinal bleeding. Continue aspirin.  3. Anemia. Recheck his CBC today to make sure anemia is not      contributing to his fatigue.  4. Pacemaker pocket hematoma. This appears stable. He will follow up      with the pacer clinic next Thursday. I told him if this should be      expanding he will need to contact us right away.     Johnny Buckles. Bensimhon, MD  Electronically Signed    DRB/MedQ  DD: 12/07/2006  DT: 12/08/2006  Job #: [id number]

## 2010-08-26 NOTE — Assessment & Plan Note (Signed)
[facility] OFFICE NOTE   [name], [name]                    MRN:          [id number]  DATE:02/28/2007                            DOB:          1930/09/14    PRIMARY CARE PHYSICIAN:  [name], M.D.   INTERVAL HISTORY:  [name] is a delightful 77 year old male with a  history of severe congestive heart failure secondary to nonischemic  cardiomyopathy with ejection fraction of 20-25%.  He also has a history  of mitral regurgitation, chronic atrial fibrillation status post AV node  ablation and biventricular pacemaker, as well as diabetes, and mild  COPD, and anemia.  He returns today for routine followup.   He has been having problems with severe dizziness and hypotension.  We  have been cutting back his ACE inhibitor and beta-blocker some.  Most  recently Dr. Graciela Husbands cut his Lisinopril back from 20 mg a day to 10 a day  as well as his Coreg down to 6.25 b.i.d.  He says he feels much better.  He is no longer having dizziness.  He thinks he is about as good as he  is going to get.  He does have chronic shortness of breath which is  unchanged.  He is able to do all his daily activities at a slow pace.  He has not had any orthopnea, PND.  His edema has been well controlled.   CURRENT MEDICATIONS:  1. Aspirin 325 a day.  2. Spiriva 18 mcg a day.  3. Amaryl 4 mg a day.  4. Protonix 40 mg a day.  5. Lisinopril 10 mg a day.  6. Digoxin 0.125 a day.  7. Iron 325 t.i.d.  8. Flomax 4 a day.  9. Lipitor 20 a day.  10.Spironolactone 25 a day.  11.Lasix 40 a day.  12.Coreg 6.25 b.i.d.   PHYSICAL EXAMINATION:  GENERAL:  He is an elderly male, ambulates around  the clinic slowly without any respiratory difficulty.  VITAL SIGNS:  Blood pressure is 106/74, heart rate is 75, weight is 217  which is up a few pounds.  HEENT:  Normal.  NECK:  Supple.  No  JVD.  Carotids are 1 plus bilaterally without  any  bruits.  There is no lymphadenopathy or thyromegaly.  CARDIAC:  PMI is  not palpable.  He has very distant heart sounds.  He is regular.  No  obvious murmurs, rubs, or gallops.  LUNGS:  Clear with mildly decreased breath sounds throughout.  ABDOMEN:  Obese, soft, nontender, nondistended.  No hepatosplenomegaly.  No bruits.  No masses appreciated.  Good bowel sounds.  EXTREMITIES:  Warm with no cyanosis, clubbing, or edema.  No rash.  NEUROLOGIC:  He is alert and oriented  x3.  Cranial nerves II-XII are  intact.  Moves all 4 extremities without difficulty.  Affect is  pleasant.   EKG shows underlying atrial fibrillation with LV pacing.   ASSESSMENT/PLAN:  1. Congestive heart failure secondary to nonischemic cardiomyopathy.      Volume status looks good.  He continues to be NYHA Class III  but      with stable symptoms.  We will try to increase his Lisinopril back      up to 10 mg twice a day to see if he tolerates it.  2. Coronary artery disease.  This is mild, nonobstructive.  Continue      current therapy.  3. Atrial fibrillation.  He is status post node ablation.  He is      intolerant to Coumadin secondary to anemia and gastrointestinal      bleeding.  We will continue full dose aspirin.  4. Hyperlipidemia.  Goal LDL is less than 70.  We will check his      lipids next week.   DISPOSITION:  I will see him back on a monthly basis.     Johnny Buckles. Bensimhon, MD  Electronically Signed    DRB/MedQ  DD: 02/28/2007  DT: 02/28/2007  Job #: [id number]   cc:   Johnny Silence, MD

## 2010-08-26 NOTE — Assessment & Plan Note (Signed)
[facility] OFFICE NOTE   FREEDOM, PEDDY                    MRN:          [id number]  DATE:01/06/2007                            DOB:          [birth year]    INTERVAL HISTORY:  [name] is a very pleasant 77 year old male with  a history of severe congestive heart failure secondary to nonischemic  cardiomyopathy with an EF of 20-25%.  He also has a history of mitral  regurgitation, chronic atrial fibrillation status post AV node ablation  and biventricular pacemaker.  He returns today for routine followup.  He  recently was in the hospital back in August with hypotension and severe  fatigue.  His volume status was quite low.  We have cut back his  diuretic as well as his beta-blocker and ACE inhibitor.  He says he  feels much much better today.  He has also been seeing Dr. Graciela Husbands who has  been working with him and his BiV ICD.  He is able to do all his ADLs  without any limitations.  His only real complaint today is back pain.  Regarding his diuretics, he has been taking Lasix 40 mg most days, every  once in a while when he notes his weight is up or has a little edema, he  takes 80 mg.  He says he has only done this about 5 times over the last  month.   CURRENT MEDICATIONS:  1. Aspirin 325.  2. Spiriva 18 mcg.  3. Amaryl 4 mg.  4. Protonix 40 a day.  5. Lisinopril 10 a day.  6. Digoxin 0.25.  7. Iron 325 t.i.d.  8. Flomax 4 a day.  9. Lipitor 20.  10.Spironolactone 25.  11.Lasix 40.  12.Coreg 6.25 b.i.d.   PHYSICAL EXAMINATION:  GENERAL:  He is an elderly male, ambulates around  the clinic without any respiratory difficulty.  VITAL SIGNS:  Blood pressure is 120/70, heart rate 76, weight is 211.  HEENT:  Normal.  NECK:  Supple.  There is no JVD.  Carotids are 1 plus bilaterally  without any bruits.  There is no lymphadenopathy or thyromegaly.  CHEST:  Defibrillator site looks good.  CARDIAC:   PMI is not palpable.  He has distant heart sounds.  He is  regular.  No obvious murmurs, rubs, or gallops.  LUNGS:  Clear.  Mildly decreased breath sounds throughout.  ABDOMEN:  Soft, nontender, nondistended.  No hepatosplenomegaly.  No  bruits.  No masses.  Good bowel sounds.  EXTREMITIES:  Warm with no cyanosis, clubbing, or edema. No rash.  NEUROLOGIC:  He is alert and oriented x3.  Cranial nerves II-XII are  intact.  He moves all 4 extremities without difficulty.  Affect is  pleasant.   ASSESSMENT/PLAN:  1. Congestive heart failure secondary to nonischemic cardiomyopathy.      Volume status looks good today.  He is still  with class III      symptoms but much improved.  We will continue to hold his      medications where they are right now.  Obviously would  like to      titrate them back up at some point if we could but I think his      symptomatic improvement is the most important thing here.  2. Atrial fibrillation.  He is intolerant of Coumadin secondary to      anemia and gastrointestinal bleeding.  Continue aspirin which he is      tolerating.   DISPOSITION:  I will see him back in the clinic in about 6 weeks.     Johnny Buckles. Bensimhon, MD  Electronically Signed    DRB/MedQ  DD: 01/06/2007  DT: 01/06/2007  Job #: [id number]

## 2010-08-26 NOTE — Assessment & Plan Note (Signed)
[facility] OFFICE NOTE   [name], [name]                    MRN:          [id number]  DATE:11/01/2006                            DOB:          [birth year]    PRIMARY CARE PHYSICIAN:  Arta Silence, M.D.   INTERVAL HISTORY:  [name] is a 77 year old male with severe  congestive heart failure secondary to nonischemic cardiomyopathy with an  EF of 20-25%.  He also has moderate-to-severe mitral regurgitation,  chronic atrial fibrillation status post recent AV node ablation and  biventricular pacemaker on Aug 24, 2006, as well as COPD and diabetes.  He returns today for routine followup.  Since I last saw him, he did  have an unscheduled visit with Dr. Antoine Poche on October 22, 2006 due to  persistent dyspnea.  He was given some extra dose of Lasix and was  scheduled for optimization of his BiV pacemaker.  It does not appear  that he has had this.  Unfortunately, he continues to be more short of  breath and actually had an episode of PND this morning, though he denies  any swelling.  He has not had any chest pain.  He denies any  palpitations.  He continues to feel dizzy with positional changes.  He  says he gets dyspneic just on walking 10 or 15 feet.  He had a  cardiopulmonary exercise test back in March which showed a peak VO2 of  11.9 with a slope of 46, consistent with moderate-to-severe functional  impairment due to advanced heart failure.   CURRENT MEDICATIONS:  1. Aspirin 325.  2. Spiriva.  3. Amaryl 4 a day.  4. Protonix 40.  5. Lisinopril 20.  6. Lasix 80.  7. Digoxin 0.25 a day.  8. Coreg 18.75 b.i.d.  9. Iron.  10.Flomax 4 mg a day.  11.Lipitor 20 a day.  12.Spironolactone 25 a day.  13.Potassium 20 a day.   PHYSICAL EXAMINATION:  GENERAL:  He is an elderly male in no acute  distress.  He walks around the clinic slowly without any obvious  respiratory distress.  VITAL SIGNS:  Blood  pressure 98/62, heart rate 80.  Weight is 215 which  is up 1 pound from previous and about 7 pounds from last month, though  he attributes this to his stopping smoking.  HEENT:  Normal.  NECK:  Supple.  No obvious JVD.  Carotids are 2+ bilateral without any  bruits.  There is no lymphadenopathy or thyromegaly.  CARDIAC:  He has very distant heart sounds.  He is regular.  I am unable  to appreciate his PMI.  There is no apparent S3.  Defibrillator site  looks okay.  LUNGS:  Clear with decreased air movement throughout.  No wheezes.  ABDOMEN:  Obese, nontender, nondistended.  There is no  hepatosplenomegaly, no bruits, no masses.  Good bowel sounds.  EXTREMITIES:  Warm with no clubbing, cyanosis, or edema.  No rash.  NEUROLOGIC:  Alert and oriented x3.  Cranial nerves II-XII are grossly  intact.  He moves all 4 extremities without difficulty.  Affect is  pleasant.   ASSESSMENT AND PLAN:  Congestive heart failure.  He continues to have  advanced heart failure symptoms, now New York Heart Association Class  IIIB with concern for a low output.  Unfortunately, he is not a  transplant candidate given his age and comorbidities.  We did discuss  the possibilities of home inotropic therapy versus possible left  ventricular assist device implantation at length.  At this point, I have  recommended proceeding with right heart catheterization to clearly  define his output.  We will schedule this for Friday.  If his output is  very compromised, then I think we will start him on a trial of home  inotropes.  If it is not too bad, then I think what may benefit him is  implanting a CardioMem pulmonary artery sensor to help meticulously  manage his volume status.  If this is unable to help him, then consider  evaluation for home inotropes or a left ventricular assist device.   DISPOSITION:  We will set him up for right heart catheterization on  Friday.  We spent over 45 minutes discussing his condition  and the  various treatment options.     [name], MD  Electronically Signed    DRB/MedQ  DD: 11/01/2006  DT: 11/01/2006  Job #: [id number]

## 2010-08-29 NOTE — Assessment & Plan Note (Signed)
[facility] OFFICE NOTE   [name], [name]                    MRN:          [id number]  DATE:02/16/2006                            DOB:          1930/11/02    PRIMARY CARE PHYSICIAN:  Kerby Nora, MD at [facility].   HISTORY OF PRESENT ILLNESS:  [name] is a 77 year old male with a  recently diagnosed ischemic cardiomyopathy in the setting of atrial  fibrillation, presents today for follow up.   PROBLEMS:  1. Congestive heart failure secondary to nonischemic cardiomyopathy.      a.     Echocardiogram showed EF of 20-25% in October 2007.      b.     Mild nonobstructive coronary artery disease.  Catheterization       October 2007.  2. Moderate to severe mitral regurgitation.  3. Atrial fibrillation with rapid ventricular response, now rate      controlled and on Coumadin.  4. COPD.  Quit smoking times one month.  5. Hypertension.  6. Hyperlipidemia.  7. History of GI bleed in 2004.  8. Previous thoracotomy for a lung nodule.  9. Left bundle branch block.   MEDICATIONS:  1. Lasix, recently increased from 40-80 mg daily.  2. Iron.  3. Aspirin 81 mg.  4. Digoxin 0.25 mg daily.  5. Coreg 12.5 b.i.d.  6. Diltiazem 120 mg daily.  7. Protonix 40 mg daily.  8. Flomax 0.4 mg daily.  9. Potassium 20 mg daily.  10.Lisinopril 10 mg daily.  11.Lipitor 20 mg daily.  12.Spiriva one daily.  13.B12.  14.Coumadin.   INTERVAL HISTORY:  [name] returns today for follow up.  He says after  he got discharged from the hospital he felt very well without any  significant dyspnea or fatigue.  Unfortunately, over the past five days, he  has noted increasing fatigue as well as some mild lower extremity swelling,  orthopnea and PND.  He went to see Dr. Hetty Ely yesterday and was given a  shot of IM Lasix and told to double his Lasix from 40 mg to 80 mg daily.  He  did have a good urine output,  but still feels quite fatigued.  He also notes  that he recently had some upper respiratory congestion, but denies fever or  chills.  He has not had any bleeding. He denies any significant chest pain.  He does have occasional palpitations.   PHYSICAL EXAMINATION:  GENERAL:  No acute distress.  Ambulates around the  clinic without any significant respiratory difficulty.  VITAL SIGNS:  Blood pressure 100/52, pulse 79, weight 196 with shoes on.  HEENT:  Sclerae anicteric.  EOMI.  No xanthelasma.  Mucous membranes moist.  NECK:  Supple.  JVP is about 7 cm of water.  Carotids are 2+ bilaterally.  There may be a mild bruit on the right but difficult to auscultate.  CARDIAC:  Distant heart sounds.  He is irregular irregular.  There is no S3  appreciated.  There is a soft apical murmur.  LUNGS:  Decreased air movement throughout,  but no wheezes or rales.  ABDOMEN:  Soft, nontender, nondistended.  No hepatosplenomegaly.  No bruits  or masses appreciated.  EXTREMITIES:  Warm with no cyanosis or clubbing.  There is a 1+ edema  bilaterally.  NEUROLOGICAL:  Alert and oriented x3.  Cranial nerves II-XII intact.  Moves  all four extremities without difficulty.   STUDIES:  EKG shows atrial fibrillation with ventricular response of 70.  There is a left bundle branch block with a QRS duration of 138 milliseconds.   ASSESSMENT/PLAN:  1. Acute on chronic congestive heart failure. His symptoms are quite      consistent with congestive heart failure flare.  However, he only has      mild volume overload on exam.  I suspect some of this may be improved      after his IV Lasix.  I told him to continue his Lasix to 80 mg a day      for the next two days and then decrease back to 40 mg daily.  We will      get a follow up echocardiogram.  Will also check labs including C-Met,      BNP and digoxin level.  Given his low blood pressure, we are unable to      tolerate medications further.  In fact, I think it  would be reasonable      to attempt to stop his Diltiazem given its negative inotropy and his      adequate heart rate control at this time.  I will see him back in one      week for follow up.  We have also ordered a follow up echocardiogram.      I told him to call me should he continue to feel worse.  2. Atrial fibrillation.  Currently, rate controlled.  We are stopping his      Diltiazem and will follow him closely.  Continue Coumadin per Dr.      Daphine Deutscher office.  3. Chronic obstructive pulmonary disease, severe.  He will need PFT's in      the future.  4. Mild nonobstructive coronary artery disease.  He is on a good medical      regimen.  We will continue this.  We will check his lipids in the      future and make sure his LDL is down below 70.   DISPOSITION:  Follow up with me in one week for reevaluation.  Once we get  his medications titrated, he probably will be a candidate for Bi-V ICD if  his EF does not improve.  I think it is  reasonable also to consider cardioversion, once his INR has been therapeutic  for at least 4-6 weeks.     [name], MD  Electronically Signed    DRB/MedQ  DD: 02/16/2006  DT: 02/17/2006  Job #: [id number]   cc:   Kerby Nora, MD

## 2010-08-29 NOTE — Discharge Summary (Signed)
NAME:  [name], [name]                       ACCOUNT NO.:  [id number]   MEDICAL RECORD NO.:  [id number]                   PATIENT TYPE:  INP   LOCATION:  5529                                 FACILITY:  [facility]   PHYSICIAN:  Lina Sar, M.D. [facility]               DATE OF BIRTH:  December 21, 1930   DATE OF ADMISSION:  05/20/2002  DATE OF DISCHARGE:  05/22/2002                                 DISCHARGE SUMMARY   ATTENDING PHYSICIANS:  1. Dr. Judie Petit T. Russella Dar.  2. Dr. Lina Sar.   PAST MEDICAL HISTORY:  1. Hematemesis associated with weakness.  2. Acute upper GI bleed.  Rule out Mallory-Weiss tear, rule out peptic ulcer     disease, rule out esophagitis and vasculitis.  3. Anemia, acute on chronic.  Is receiving monthly B12 injections and takes     over-the-counter-strength iron at home.  4. Chronic obstructive pulmonary disease with ongoing two-pack-per-day     smoking habit.  5. Status post thoracotomy of benign right lung lesion.  6. History of colon polyps, unclear as to when he last had colonoscopy.  7. History of hiatal hernia and gastroesophageal reflux disease.  8. History of pyelonephritis.  9. Benign prostatic hypertrophy.  10.      Status post left knee arthroscopy.  11.      Degenerative disk disease.  12.      Osteoarthritis, especially involving the right hip.   BRIEF HISTORY:  This is a 77 year old white male who is followed by Dr.  Laurita Quint at [place], [place].  The patient had been having recent  increasing abdominal bloating and soreness in the epigastric region.  He  progressed to nausea on the evening prior to coming to the emergency room  and self-induced vomiting to relieve his symptoms.  He did throw up food and  thereafter had several episodes of throwing up bright red blood.  This was  early in the morning at about 3 a.m.  He slept until about 10 a.m. and felt  weak and dizzy and EMS was called; they noted him to have orthostatic blood  pressures.   The patient refused to see Dr. Titus Dubin. Alwyn Ren because of some  misunderstanding about the care of a relative in the remote past and Dr.  Alwyn Ren was covering for [facility].  Therefore, Dr. Russella Dar was  contacted and admitted the patient.   CONSULTATIONS:  None.   PROCEDURES:  Upper endoscopy by Dr. Lina Sar, May 22, 2002.  This  showed chronic esophagitis, a 3-cm hiatal hernia.  No evidence for Mallory-  Weiss tear.  Biopsies of the distal esophagus were obtained and showed mild  inflammatory changes but no intestinal metaplasia, dysplasia or malignancy.   HOSPITAL COURSE:  #1 - HEMATEMESIS:  The patient was felt to initially  probably have a Mallory-Weiss tear, given the patient's history.  However,  the upper endoscopy did not confirm  Mallory-Weiss tear, rather it confirmed  the esophagitis as the possible source for the GI bleed.   #2 - ANEMIA:  The patient had a hemoglobin of 9.9 when admitted; it dropped  to 8.3.  He was transfused with two units of packed red blood cells, at  which point it came up to 10.4.  MCV was within normal limits.   #3 - ELEVATED BUN:  This is normalizing with hydration but was not entirely  within normal limits at the time of discharge.   DISCHARGE DIAGNOSES:  1. Hematemesis, possibly secondary to esophagitis or unseen Mallory-Weiss     tear.  2. Anemia, status post transfusion with two units of packed red blood cells.  3. Chronic obstructive pulmonary disease with stable symptoms during this     admission.  4. Azotemia secondary to gastrointestinal bleed, improved.  5. Ongoing tobacco abuse in a patient with chronic lung disease.  6. History of colon polyps with date of latest colonoscopy undetermined.   DISCHARGE PLANS:  He is to follow up June 19, 2002 with Dr. Wilhemina Bonito. Marina Goodell at  [facility].   MEDICATIONS AT DISCHARGE:  1. Iron one p.o. daily.  2. Protonix 40 mg p.o. daily.  3. Lotensin 40 mg one p.o. q.h.s.  4.  Hydrochlorothiazide 20/25 mg one p.o. q.a.m.  5. Aspirin 325 mg p.o. daily.  6. Lasix 20 mg p.o. daily.  7. Potassium chloride 10 mEq p.o. daily.   CONDITION AT DISCHARGE:  Stable and improved.   LABORATORY DATA:  White blood cell count went from 11.5 down to 8.9.  Hemoglobin went from 11 to 8.3 up to 10.4.  Hematocrit was 30.7 at  discharge.  Platelets 298,000.  PT 13.5, INR 1.0 and PTT 28.  Sodium 133,  potassium 3.6.  Chloride 102, CO2 26.  Glucose ranged 80 to 128.  BUN went  from 49 down to 35.  Creatinine was 1.2.  Albumin 3.4.  Total protein 5.9.  Calcium 8.1.  Total bilirubin 0.6, alkaline phosphatase 68, AST 15, ALT 17.  Lipase 31.  CK 57, CK-MB 2.6 and troponin I 0.01.     Brett Canales, P.A. [facility]                    Lina Sar, M.D. [facility]    SG/MEDQ  D:  07/10/2002  T:  07/11/2002  Job:  [id number]   cc:   Laurita Quint, M.D.  [address] [place]  [place] [address]  Fax: [phone]

## 2010-08-29 NOTE — H&P (Signed)
NAME:  [name], [name]                       ACCOUNT NO.:  [id number]   MEDICAL RECORD NO.:  [id number]                   PATIENT TYPE:  INP   LOCATION:  5529                                 FACILITY:  [facility]   PHYSICIAN:  Malcolm T. Russella Dar, M.D. [facility]          DATE OF BIRTH:  16-Oct-1930   DATE OF ADMISSION:  05/20/2002  DATE OF DISCHARGE:                                HISTORY & PHYSICAL   CHIEF COMPLAINT:  Weakness and hematemesis.   HISTORY OF PRESENT ILLNESS:  The patient is a pleasant 77 year-old white  male.  On the evening prior to admission he was experiencing abdominal  bloating, epigastric soreness and nausea.  He ended up inducing vomiting for  relief of his symptoms.  Initially he threw up partially digested food.  The  next several episodes of emesis were that of bright red blood.  The episodes  occurred at about 3 a.m.  At 10 a.m., he awoke and felt very weak.  EMS was  contacted and transported the patient to the ER.  Blood pressures were  orthostatic per EMS evaluation.  The patient refused to see Dr. Alwyn Ren who  was covering for Dr. Rayburn Ma and therefore Dr. Claudette Head was called to see  the patient and proceeded to admit him.   The patient's prior GI history consists of a hiatal hernia with  gastroesophageal reflux disease in the remote past and he may have had upper  endoscopy more than five years ago.  He says that he rarely gets heartburn  and is not taking any kind of GI protective medications or antacids.  He  also has a history of colon polyps on colonoscopy probably again more than  five years ago.  This colonoscopy was performed at [facility].  There really are not any chronic GI problems.  Appetite is generally good,  no dysphagia and no significant weight fluctuation.  Neurologically, the  patient does complain of lightheadedness with standing, but he denies any  syncope.  No chest pain or palpitations.  Dyspnea is not above baseline.  The  patient does say that he uses Aleve two at a time, but no more than a  total of six pills in any given four to five week period and the use of  Aleve has not accelerated in the recent past.  The patient does use daily  full dose aspirin.   PAST MEDICAL HISTORY:  1. History of anemia.  He does get monthly B12 injections and has been     taking iron daily for quite some time.  2. Hypertension.  3. Chronic obstructive pulmonary disease with ongoing two pack per day     smoking habit.  4. Status post thoracotomy of a benign lesion on the right.  5. History of colon polyps.  6. Hiatal hernia/gastroesophageal reflux disease.  7. History of pyelonephritis.  8. Benign prostatic hypertrophy.  9. Status post left knee arthroscopy.  10.      Degenerative disk disease.  11.      Osteoarthritis, especially involving the right hip.   ALLERGIES:  No known drug allergies.   CURRENT MEDICATIONS:  1. Lotensin 40 mg one p.o. at h.s.  2. Hydrochlorothiazide 20/25 mg one p.o. q.a.m.  3. Aspirin 325 mg p.o. q. daily.  4. Lasix 20 mg p.o. q. daily.  5. Potassium chloride 10 mEq p.o. q. daily.  6. Iron 200 mg p.o. q. daily.   SOCIAL HISTORY:  The patient is married and lives with his wife in  [place].  He is retired from [employer] work at the [employer].  He drinks about two whiskeys per night.  He smokes two packs of cigarettes  per day and has been a smoker for many decades.   FAMILY HISTORY:  Both of his parents died with congestive heart failure, but  he denies any family history of GI disease, colon cancer, coronary artery  disease, renal disease or cancers.   REVIEW OF SYMPTOMS:  NEUROLOGIC:  The patient denies headaches.  He does get  compressive numbness in his lower arms and hands at night when he is  sleeping. He also gets numbness in his buttock and into his groin associated  with walking on hard surfaces; this eases when he rests.  No tingling or  paresthesias.   CARDIOVASCULAR:  He has had prior stress tests which  apparently he did well on.  No history of catheterizations or MI's.  No  chest pain.  No extremity edema.  PULMONARY:  He does have a morning chronic  cough which produces clear phlegm.  He denies significant activity limiting  dyspnea.  It seems that his hip is more limiting to activity than his  breathing.  GU:  He has about three episodes of nocturia per night.  No  hesitancy.  GI:  As above.  The patient does say that his stools are  chronically dark because of the iron he uses.  ENDOCRINE:  Occasional cold  sweats not necessarily related to activity or time of day; these are  infrequent.  GENERAL:  Appetite is good.  Weight fluctuates within about an  eight to ten pound range over the last ten years.  MUSCULOSKELETAL:  Right  hip pain.  Chronic deformity and swelling of the left knee where he had  surgery. All other systems were reviewed and were negative.   PHYSICAL EXAMINATION:  GENERAL:  The patient is an obese and somewhat  chronically ill appearing white male with mild dyspnea.  VITAL SIGNS:  Blood pressure 144/78, pulse 93, respirations 28, temperature  99.0, weight 92.6 kilos.  HEENT EXAM:  Sclerae are non-icteric.  Conjunctivae are pink.  Extraocular  movements are intact.  Oropharynx:  Mucous membranes are dry.  The neck is  supple without thyromegaly.  No bruits.  No JVD.  CHEST:  Breath sounds are clear but diminished on the right side greater  than the left side. No cough with breathing.  CARDIOVASCULAR:  There is a regular rate and rhythm.  No murmurs, rubs or  gallops.  ABDOMEN:  Large, soft, non-tender and non-distended.  Bowel sounds are  normoactive.  No hepatosplenomegaly or masses.  RECTAL EXAM: Stool is heme positive per Dr. Vicente Males, the emergency room  physician.  No melena.  No rectal masses.  NEUROLOGIC:  He is alert and oriented times three with no tremor.  Grip is 5/5 bilaterally.  MUSCULOSKELETAL:  The  left knee is  somewhat deformed and has some crepitus.  EXTREMITIES:  There is slight edema of the hand on the right.  No pedal  edema.  DERMATOLOGIC:  No significant bruising.  No obvious tattoos.   LABORATORY DATA:  Hemoglobin 9.9, white blood cell count 11.5, platelets  366,000.  PT 13.5, INR 1.0, PTT 28.0. BUN 44, creatinine 1.0, albumin 3.4.  Amylase is 62 and lipase is 31.  CK 57, CK MB 2.6, Troponin I less than  0.01.   IMPRESSION:  1. Acute upper gastrointestinal bleed with hematemesis, likely this is     secondary to a Mallory-Weiss tear.  Need to rule out peptic ulcer     disease, esophagitis, gastritis, etc.  2. Anemia, acute on chronic.  3. Chronic obstructive pulmonary disease.  4. Hypertension.  5. Azotemia secondary to gastrointestinal bleed.   PLAN:  1. The patient is admitted to Dr. Derl Barrow service. We plan to     perform upper endoscopy within the next 24 to 48 hours to evaluate for     etiology of the gastrointestinal bleed.  2. Serial CBC's with plans for consideration of transfusion if hemoglobin     drops below 8.0.     Brett Canales, P.A. [facility]                    Malcolm T. Russella Dar, M.D. [facility]    SG/MEDQ  D:  05/21/2002  T:  05/21/2002  Job:  [id number]

## 2010-08-29 NOTE — Op Note (Signed)
[name], [name]             ACCOUNT NO.:  [id number]   MEDICAL RECORD NO.:  [id number]          PATIENT TYPE:  AMB   LOCATION:  ENDO                         FACILITY:  [facility]   PHYSICIAN:  Iva Boop, MD,FACGDATE OF BIRTH:  1930-09-14   DATE OF PROCEDURE:  02/26/2006  DATE OF DISCHARGE:  02/26/2006                                 OPERATIVE REPORT   GASTROENTEROLOGIST:  Iva Boop, MD,FACG.   PROCEDURE:  Small bowel capsule endoscopy.   REASON FOR REFERRAL:  A 77 year old man with heme-positive stool and iron  deficiency anemia.  EGD 11/07 unrevealing.  Colonoscopy 3/06 showed  diverticulosis, internal hemorrhoids and colon polyps.   PROCEDURE DATA:  Height 68 inches, weight 195 pounds, waist 40 inches,  normal build, gastric passage time 4 minutes.   FINDINGS:  1. Incomplete exam.  The images stopped at 4 hours 30 minutes.  There was      a fair prep.  2. There were 3 AVMs noted.   SUMMARY AND RECOMMENDATIONS:  Iron deficiency anemia and heme-positive stool  could be caused by small bowel AVMs.  I do not think a push enteroscopy  would help, i.e., probably not within reach.   Some irregular mucosa was also seen of unclear significance.  Duodenal  biopsies were negative for sprue at EGD.   Possible early battery failure causing loss of images at 4 hours 34 seconds.      Iva Boop, MD,FACG  Electronically Signed     CEG/MEDQ  D:  03/02/2006  T:  03/03/2006  Job:  [id number]   cc:   Hedwig Morton. Juanda Chance, MD
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
## 2010-08-29 NOTE — Assessment & Plan Note (Signed)
[name] OFFICE NOTE   DEMONI, GERGEN                    MRN:          [id number]  DATE:07/30/2006                            DOB:          [birth year]    PRIMARY CARE PHYSICIAN:  Kerby Nora, M.D.  Arta Silence, M.D.   INTERVAL HISTORY:  Mr. Keimig is a delightful 77 year old male with a  history of congestive heart failure, secondary to non-ischemic  cardiomyopathy, with ejection fraction of 20-25%, which is decreased  over the past six months.  He also has severe mitral regurgitation,  chronic atrial fibrillation, severe COPD, diabetes and left bundle  branch block.  He was previously treated with Coumadin for his atrial  fibrillation, but developed a significant GI bleed, requiring  transfusion.  He had a negative EGD and capsule endoscopy.  He has been  since maintained on aspirin.   Recently, he has been complaining of Class 3-B symptoms with shortness  of breath and fatigue with just walking to his mail box.  He has also  had mild orthopnea, but no PND, no chest pain.  We have increased his  Lasix to 80 a day and that has helped greatly with his volume, but he  has continued to have persistent heart failure symptoms.  He underwent  CPX testing two weeks ago, which showed a peak VO2 of 11.9 with a VEVCO2  slope of 46.1 and a peak RAR of 1.12.  His FEV1 was 60% of predicted, at  one point 6 L.   CURRENT MEDICATIONS:  1. Lisinopril 20 a day.  2. Digoxin 0.125 a day.  3. Aspirin 325 a day.  4. Protonix 40 a day.  5. Flomax.  6. Potassium 20 a day.  7. Lipitor 20 a day.  8. Spiriva.  9. Iron 325 t.i.d.  10.Lasix 80 a day.  11.Coreg 18.75 b.i.d.   PHYSICAL EXAM:  He is an elderly male, who walks around the clinic  slowly, but without any respiratory distress.  Blood pressure is 104/62,  heart rate 63, his weight is 199, which is stable for him.  HEENT:  Normal.  NECK:  Supple,  there is no JVD.  Carotids are 2+ bilaterally without any  obvious bruits.  CARDIAC:  He has very distant heart sounds.  He is irregular.  No  obvious murmur or gallop.  LUNGS:  Clear with decreased air movement throughout, much improved from  previous.  ABDOMEN:  Obese, nontender, nondistended.  No obvious  hepatosplenomegaly, no bruits, no masses.  EXTREMITIES:  Warm with no cyanosis, clubbing or edema, no rash.  NEUROLOGIC:  He is alert and oriented times three.  Cranial nerves II  through XII are intact.  Moves all four extremities without difficulty.  Affect is pleasant.   ASSESSMENT AND PLAN:  1. Congestive heart failure, secondary to non-ischemic cardiomyopathy.      His CPX testing indicates a clear heart failure limitation with a      very low VO2 in the face of good medical therapy.  Given his left      bundle  branch block and mitral regurgitation, I think he would be a      very good candidate for a bi-V ICD and I will have him see Dr.      Berton Mount in the near future.  We also did discuss the      possibility of trying to put him back in normal rhythm.  However,      he has been intolerant of Coumadin in the past and this complicates      matters somewhat.  I think it is reasonable to see how he responds      to bi-V pacing and then decide again on the risks and benefits of      cardioversion.  I will also discuss this with Dr. Graciela Husbands.  2. Atrial fibrillation, as above.  Continue rate control and aspirin.  3. COPD.  He has been stopped smoking for six months and I      congratulated him again on this.   DISPOSITION:  I will see him back in the near future, after he has his  appointment with Dr. Graciela Husbands.     [name], MD  Electronically Signed    DRB/MedQ  DD: 07/30/2006  DT: 07/30/2006  Job #: [id number]   cc:   Kerby Nora, MD  Arta Silence, MD

## 2010-08-29 NOTE — Assessment & Plan Note (Signed)
[facility]                         GASTROENTEROLOGY OFFICE NOTE   [name]                    MRN:          [id number]  DATE:03/11/2006                            DOB:          01-20-1931    REASON FOR CONSULTATION:  Anemia post hospitalization.   HISTORY:  This is a pleasant 77 year old white male with multiple  significant medical problems including chronic nonischemic  cardiomyopathy with systolic congestive heart failure, chronic atrial  fibrillation, tobacco-induced COPD, chronic anemia, glucose intolerance,  dyslipidemia, and benign prostatic hypertrophy.  He is referred through  the courtesy of Dr. Hetty Diaz after recently being hospitalization with  symptomatic anemia.  I have seen the patient on two occasions as an  outpatient for colonoscopy, in 2004 and again in 2006.  He was found to  have multiple diminutive adenomatous colon polyps which were removed.  The patient reports to me longstanding problems with anemia for which he  has been on regular iron.  He has not seen a hematologist.  His present  history dates back to January 21, 2006, when he was hospitalized with an  exacerbation of his congestive heart failure in the setting of atrial  fibrillation and COPD.  He was hospitalized for approximately 1 week.  He did undergo cardiac catheterization with no significant obstructive  disease found.  At that time, for the first time, he was placed on  Coumadin.  Over the next month his hemoglobin drifted from 9.5 to 7.5.  At 7.5 the patient felt weak and had dyspnea on exertion.  For this, on  February 18, 2006, he was hospitalized.  While on Coumadin the patient  denied any interval melena or hematochezia.  However, he states his  stools are chronically dark due to iron.  His stools were checked for  occult blood in the hospital and returned negative.  His admission  hemoglobin was 8.0 with a normal MCV at 91.7.  Platelets  slightly  elevated 446,000.  His prothrombin time was 20.1 seconds with an INR of  1.6.  Ferritin level was normal at 24 and his iron level depressed at  35.  He was transfused.  He underwent upper endoscopy with Dr. Lina Diaz.  This revealed a hiatal hernia and mild esophagitis.  No lesions  to explain bleeding.  His Coumadin was held and on February 22, 2006, he  was discharged home.  He is currently on 65 mg of iron three times  daily.  He remains on a baby aspirin daily.  Capsule endoscopy was  performed February 26, 2006.  Unfortunately, there was early battery  failure causing loss of images beginning at 4 hours 34 seconds.  Despite  this, he was noted to have several focal erythematous lesions consistent  with vascular malformations.  Since his hospital discharge he has seen  both Dr. Gala Diaz and Dr. Hetty Diaz.  CBC 3 days ago in Dr. Lorenza Diaz  office revealed a hemoglobin of 10.9 (compared to 10.0 on November 12 at  hospital discharge).  The patient's GI review of systems is  unremarkable.  In particular, no heartburn, dysphagia, abdominal pain,  change in bowel habits, melena, or hematochezia.  He is due for  surveillance colonoscopy in March 2009.   PAST MEDICAL HISTORY:  Extensive, as outlined above.   ALLERGIES:  No known drug allergies.   CURRENT MEDICATIONS:  1. Aspirin 81 mg daily.  2. Xopenex 45 mcg q.6h.  3. Spiriva inhaler.  4. Potassium chloride ER 20 mEq daily.  5. Protonix 40 mg daily.  6. Lisinopril 10 mg daily.  7. Furosemide 40 mg daily.  8. Digitek 0.25 mg daily.  9. Carvedilol 12.5 mg b.i.d.  10.Ferrous sulfate 65 mg t.i.d.  11.Flomax 4 mg b.i.d.  12.Lipitor 20 mg daily.   FAMILY HISTORY:  No family history of gastrointestinal malignancy.   SOCIAL HISTORY:  The patient is married with one daughter, lives with  his wife.  He is retired from [employer], has a Education administrator.  He is reformed smoker, does not use alcohol.   REVIEW OF  SYSTEMS:  Per diagnostic evaluation form.   PHYSICAL EXAMINATION:  GENERAL:  Pleasant but chronically-ill-appearing  male in no acute distress.  VITAL SIGNS:  Blood pressure is 120/70, heart rate 60 and irregular,  weight is 200.2 pounds, he is 5 feet 7 inches in height.  HEENT:  Sclerae are anicteric, conjunctivae are pink.  Oral mucosa is  intact.  There were no telangiectasias on the buccal mucosa. There is no  adenopathy.  LUNGS:  Reveal somewhat distant breath sounds but are otherwise clear.  HEART:  Irregular without appreciable murmur.  The heart sounds are also  a bit distant.  ABDOMEN:  Obese and soft without tenderness, mass, or hernia.  Good  bowel sounds heard.  EXTREMITIES:  Reveal trace edema in the right lower extremity only, good  pulses bilaterally.  No ecchymoses, slight stasis changes.  NEUROLOGIC:  He is grossly intact.   IMPRESSION:  This is a 77 year old gentleman with multiple significant  medical problems who presents regarding anemia after a hospitalization  for the same.  It appears that he has had chronic anemia for some time.  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  I do, however, suspect that he may have other factors  contributing to his anemia.  In any event, off Coumadin and on higher  dose iron therapy he has had improvement in his blood counts.  If his  issue is indeed that of small-intestinal vascular malformations then one  might anticipate accelerated blood loss with Coumadin, Plavix, or other  antiplatelet therapies.  To this end, it is a bit discouraging that he  experienced a significant decrease in his blood counts while on Coumadin for only 3 weeks.  There is no proven medical therapy that would help.   Furthermore,the lesions are a bit too distal (by the capsule study) to  make endoscopic ablation an option.   RECOMMENDATIONS:  1. Continue iron therapy three times daily.  2. Have blood counts checked periodically (initially monthly) with Dr.      Hetty Diaz.  3. If recurrent anemia on iron without obvious GI bleeding, then      hematology input, looking for other contributors to anemia, would      be desirable and could be arranged by Dr. Hetty Diaz.  4. Decisions regarding Coumadin therapy based on the  above information      to be made by the patient's cardiologist or primary care      physician.They are in the best position to weigh the risk/benefit      ratio.  Certainly, if he were to go on Coumadin then his stools and      blood counts would need to be watched quite closely.  He has plans      to return to the care of Dr. Hetty Diaz and Dr. Gala Diaz in the near      future.     Wilhemina Bonito. Marina Goodell, MD  Electronically Signed    JNP/MedQ  DD: 03/11/2006  DT: 03/11/2006  Job #: [id number]   cc:   Arta Silence, MD  [name], MD

## 2010-08-29 NOTE — Assessment & Plan Note (Signed)
[facility] OFFICE NOTE   CASTULO, SCARPELLI                    MRN:          [id number]  DATE:04/27/2006                            DOB:          16-Aug-1930    PRIMARY CARE PHYSICIAN:  Dr. Kerby Nora.   PATIENT IDENTIFICATION:  [name] is a delightful 77 year old male  with a history of congestive heart failure secondary to nonischemic  cardiomyopathy EF 35% - 40%, chronic atrial fibrillation, and COPD who  returns for a routine followup.   PAST MEDICAL HISTORY:  Also notable for upper GI bleed with EGD showing  no active bleeding and a capsule endoscopy showing just oozing.  This  was done by Dr. Marina Goodell.   INTERVAL HISTORY:  Mr. Stebner has said that over Christmas he had a  head cold and was very short of breath and felt like he was choking.  He  was put on cough syrup which made him feel much better.  He also had  some fluid overload and initially increased his Lasix to 80 mg a day,  however, he subsequently himself reduced this to 40 mg a day as he felt  he was getting dehydrated.  He is now doing well.  He denies any  orthopnea, PND, no chest pain, no lower extremity edema, no  palpitations.   PROBLEM LIST:  As past medical history as above, please my March 08, 2006 note for complete details.   CURRENT MEDICATIONS:  1. Aspirin 81 mg a day.  2. Digoxin 0.25 mg a day.  3. Coreg 12.5 mg b.i.d.  4. Protonix 40 mg a day.  5. Flomax 0.4 mg a day.  6. Potassium 20 mEq a day.  7. Lisinopril 10 mg a day.  8. Lipitor 20 mg a day.  9. Spiriva once a day.  10.B-12.  11.Iron.  12.Lasix 40 mg a day.  13.Xopenex nebulizers.   PHYSICAL EXAMINATION:  He is well-appearing and in no acute distress.  He ambulates around the clinic without any respiratory difficulty.  Blood pressure is 116/65, heart rate is 52.  HEENT:  Sclerae are anicteric, EOMI, there are no xanthelasmas, mucus  membranes are  moist, oropharynx is clear.  NECK:  Supple, JVP is flat, carotids are 2+ bilaterally, there is a soft  bruit on the right.  CARDIAC:  Distant heart sounds, he is irregular and bradycardic, there  is no S3, there is a soft systolic murmur at the left sternal border as  well as a soft apical systolic murmur.  LUNGS:  Have decreased air movement throughout, no wheezes or rales.  ABDOMEN:  Soft, nontender, nondistended, no hepatosplenomegaly, no  bruits, no mass.  EXTREMITIES:  Warm with no cyanosis, clubbing, or edema.  NEUROLOGY:  He is alert and oriented x3, cranial nerves II-XII are  intact, moves all 4 extremities without difficulty.  Affect is bright.   ASSESSMENT:  1. Congestive heart failure, he is well compensated.  Currently [place]      [place] Heart Association Class II-III.  We will continue to titrate  his lisinopril to 20 mg a day and check a BMET in one week.  I      suspect that his cardiomyopathy is primarily due to his atrial      fibrillation, and now that he is rate controlled I suspect he will      have a complete recovery of his left ventricular function.  We will      recheck his ejection fraction in 2 months.  2. Atrial fibrillation, this is chronic.  He actually has a slow      ventricular response today.  We will cut back on his Digoxin to      0.125 a day.  Given that he seems to have some gastrointestinal      oozing we will not keep him on Coumadin and instead switch to 325      mg of aspirin.  3. Hypertension, well controlled.  4. Hyperlipidemia, continue Lipitor.  We will check lipid panel soon.  5. Diabetes, stable as per Dr. Ermalene Searing.     [name], MD  Electronically Signed    DRB/MedQ  DD: 04/27/2006  DT: 04/27/2006  Job #: [id number]   cc:   Kerby Nora, MD

## 2010-08-29 NOTE — H&P (Signed)
[name], [name]             ACCOUNT NO.:  [id number]   MEDICAL RECORD NO.:  [id number]          PATIENT TYPE:  INP   LOCATION:  4740                         FACILITY:  [facility]   PHYSICIAN:  Valerie A. Felicity Coyer, MDDATE OF BIRTH:  [birth year]   DATE OF ADMISSION:  02/18/2006  DATE OF DISCHARGE:                                HISTORY & PHYSICAL   CHIEF COMPLAINT:  Anemia (sent from cardiology office).   HISTORY OF PRESENT ILLNESS:  The patient is a 77 year old pleasant white  gentleman who was recently hospitalized 01/21/2006 through 01/28/2006, for a  systolic exacerbation of CHF in a setting of rapid AFib and COPD  exacerbation.  After evaluation during that hospitalization including  catheterization he was found to have a nonischemic cardiomyopathy and began  on Coumadin for risk reduction related to his AFib.  He has been monitored  on an outpatient basis for CHF and had a slight exacerbation in his  shortness of breath symptoms last week at which time he was seen by  cardiology, Dr. Gala Romney, as well as his primary MD and his meds were  adjusted to include an increase in Lasix from 40 to 80 mg daily.  Despite  this adjustment in medication, the patient remained short of breath and weak  and reevaluation in cardiology office today by Dr. Gala Romney revealed a  hemoglobin of 7.5 and patient was thus referred for evaluation of acute  blood loss anemia.  Of note, patient has a history of upper GI bleed in  05/2002, undergoing endoscopy at that time by Dr. Juanda Chance showing only  esophagitis and no acute cause for bleed identified.  The patient has a  history of chronic iron deficiency anemia and takes iron three times daily  but has never had any source identified for his chronic GI loss according to  him.  He has been on and off Prednisone for the last several weeks related  to his COPD.  He does take aspirin daily.  He has recently quit smoking  during his October hospitalization.   He denies any bright red blood per  rectum or melena.  Denies diarrhea and in fact says he has been constipated  the last week.  No nausea, vomiting or abdominal pain.  No other over-the-  counter NSAIDs.   PAST MEDICAL HISTORY:  Please see above.  The patient has a history of:  1. Systolic CHF, chronic, due to nonischemic cardiomyopathy.  2. Chronic AFib initiated on Coumadin, 01/2006.  3. COPD with recent discontinuation of tobacco.  4. Chronic iron deficiency anemia, hemoglobin of 10.3 at discharge      01/28/2006.  5. 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  Hemoglobin A1c      is 6.0 on 02/07/2006.  6. BPH on treatment.  7. Dyslipidemia on statin.   MEDICATIONS:  Current medications include:  1. Aspirin 81 mg once daily.  2. Xopenex HFA 2 inhalations every 6 hours p.r.n., last used yesterday      according to the patient.  3. Spiriva inhaler, 1 inhalation every a.m.  4. Potassium 20 mEq one p.o. daily.  5. Protonix 40 mg one p.o. daily.  6. Lisinopril 10 mg p.o. daily.  7. Lasix 80 mg daily for the last 2 days, tomorrow to resume 40 mg once      daily for maintenance fluid.  8. Digoxin 0.25 mg once daily.  9. Coreg 12.5 mg p.o. two times daily.  10.Iron sulfate 325 mg p.o. three times daily before meals.  11.Flomax 0.4 mg two tablets p.o. each bedtime.  12.Lipitor 20 mg p.o. each bedtime.  13.Coumadin, currently at 6 mg daily.  The patient unsure of his last INR,      says it was checked last week and at that time increased from 4 mg to 6      mg daily and has not been rechecked since.   ALLERGIES:  No known drug allergies.   FAMILY HISTORY:  Noncontributory.   SOCIAL HISTORY:  He quit smoking last hospitalization in October, 2007.  He  currently lives with his wife.  He is retired.   REVIEW OF SYSTEMS:  Negative except as in HPI above.  No chest pain, no  cough, no leg swelling, no fever or chills or recent ill contacts.   PHYSICAL EXAMINATION:   Temperature 97.8, blood pressure 95/53 with a pulse  of 93, respirations 20, satting 100% on room air.  In general he is a very  pleasant, though weathered, elderly white gentleman in no acute distress.  His wife at his bedside.  HEENT:  Unremarkable with normocephalic,  atraumatic exam.  PERRL. EOMI.  Oropharynx clear.  Neck is supple without  JVD, LAD or goiter.  Lungs:  Clear to auscultation bilaterally with good air  movement, no wheeze or crackle, no rhonchi.  Cardiovascular:  Is irregular  but no murmur, rub or gallop appreciated.  Abdomen:  Protuberant and firm  but nontender and nondistended with good bowel sounds, no rebound or  guarding.  Extremities:  Show trace edema bilaterally at the ankles without  swelling or erythema.  Neurologically he is awake, alert and oriented x4 and  without gross motor, sensory or cognitive deficits.   LABORATORY DATA:  Has not yet been done in the emergency room, again  reported hemoglobin of 7.5 from cardiology office but data unavailable.  Currently pending are CBC, INR and basic metabolic.   ASSESSMENT/PLAN:  1. Acute blood loss anemia with history of chronic iron deficiency anemia.      Patient now on chronic anticoagulation and suspects subacute      gastrointestinal bleed given history of same.  We will recheck his      hemoglobin now and transfuse 2 units of packed red blood cells if      hemoglobin is less than or equal to 8 given his history of CHF and      exacerbation of symptoms including dyspnea without hypoxia.  We will      check iron, ferritin and fecal occult blood.  We will monitor serial      H&H to monitor for further decline.  Note, patient is hypotensive but      has no clinical symptoms of active ongoing gastrointestinal bleed.  We      will reverse Coumadin as INR is supertherapeutic and hold until GI      evaluation is complete.  We will also notify GI of admission and have     discussed with Dr. Christella Hartigan on call tonight  should there be problems      overnight but anticipate full GI consult  and further evaluation in the      a.m.  We will place patient on liquids tonight in anticipation of      endoscopy in the morning.  Clearly, we will also hold aspirin therapy      until further evaluation. Continue proton pump inhibitor two times      daily.  We will give IV once and p.o.  2. Hypotension, question related to above versus over treatment with his      cardiac medications.  Patient is asymptomatic at this time but will      hold the beta blocker, ACE inhibitor and monitor as mentioned above.  3. History of systolic heart failure with nonischemic cardiomyopathy.      Patient is euvolemic at present.  We will watch volume, especially PRBC      transfusion, which is anticipated.  We will check BMP now at      pretransfusion as well as his lytes and renal function on the basic      metabolic.  4. Chronic obstructive pulmonary disease, no acute exacerbation. We will      continue his home medications for monitoring and maintenance.  The      patient was congratulated on his cessation of smoking.  5. Chronic atrial fibrillation.  He is rate controlled holding Coumadin as      above.  We will watch for rate exacerbation while holding his beta      blocker.  Continue digoxin, check level.  6. Steroid induced diabetes, not currently on medication.  We will monitor      his CBGs with sliding scale insulin      p.r.n.  7. Benign prostatic hypertrophy. We will continue home Flomax.  8. Dyslipidemia, continue home statin.      Valerie A. Felicity Coyer, MD  Electronically Signed     VAL/MEDQ  D:  02/18/2006  T:  02/19/2006  Job:  [id number]

## 2010-08-29 NOTE — Letter (Signed)
August 04, 2006    [name]. Bensimhon, MD  [address], [place] [address]   RE:  [name]  MRN:  [id number]  /  DOB:  11-13-1930   Dear [name],   It was a pleasure to see [name] today at your request for  consideration of ICD and CRT implantation.   He is a 77 year old gentleman with a history of congestive heart failure  persistent class 3 symptoms with shortness of breath at 50-100 feet  whose myopathy is nonischemic by catheterization. His ejection fraction  is in the 20-25% range and this is even in the context of moderate to  severe mitral regurgitation. His shortness of breath is in part  attributable to COPD which is felt to be severe from lifelong smoking.  He did undergo CPX testing and apparently both cardiac and respiratory  limitations were identified.   PAST CARDIAC HISTORY:  In addition to the above is notable for:  1. Atrial fibrillation that as best as he knows was first identified      in the fall. Coumadin was started and then he ended up getting a GI      bleed requiring a transfusion.  2. Left bundle branch block.  3. Lower extremity weakness with walking, question claudication.   PAST MEDICAL HISTORY:  In addition to the above is notable for:  1. Diabetes which is borderline.  2. BPH.  3. Anemia for which he has been treated with iron and B12 for years.  4. Arthritis.   FAMILY HISTORY:  Noncontributory.   SOCIAL HISTORY:  He lives with his wife.   REVIEW OF SYSTEMS:  Negative.   CURRENT MEDICATIONS:  1. Xopenex.  2. Spiriva.  3. Protonix.  4. Carvedilol 12.5.  5. Ferrous sulfate 65 t.i.d.  6. Flomax 8 daily.  7. Lipitor 20.  8. Lisinopril 20.  9. Furosemide 80.  10.Digitek 0.125.  11.Aspirin 325.  12.Warfarin is off.   He has no known drug allergies.   PHYSICAL EXAMINATION:  GENERAL:  He is an elderly Caucasian male  appearing his stated age of 40.  VITAL SIGNS:  His blood pressure was 102/60, his pulse was 65,  his  weight was 207.  HEENT:  Demonstrated no icterus or xanthoma.  NECK:  Neck veins were flat. Carotids were brisk and full bilaterally  without bruits.  BACK:  Without kyphosis or scoliosis.  CHEST:  Markedly increased with an AP diameter.  LUNGS:  Clear.  HEART:  Sounds were somewhat distant.  ABDOMEN:  Protuberant with active bowel sounds. There is no  hepatomegaly. Abdominal pulsation was not appreciated.  EXTREMITIES:  Femoral pulses were 2+, distal pulses were trace. There  was no clubbing, cyanosis or edema.  NEUROLOGIC:  Grossly normal.  SKIN:  WARM and dry.   Electrocardiogram dated today demonstrated atrial fibrillation at a rate  of 65 with intervals of - 0.13/0.42, the axis was 81 degrees.   Holter monitor demonstrated variations in range from 35 to 135.   IMPRESSION:  1. Nonischemic cardiomyopathy.  2. Chronic systolic heart failure.  3. Atrial fibrillation - permanent.  4. Chronic obstructive pulmonary disease.  5. Mitral regurgitation.   [name], [name], has significant cardiomyopathy, atrial fibrillation  and severe COPD with CPX testing. The CPX apparently shows a combination  defect. I think that CRT/ICD is appropriate therapy at this point.  Hopefully this will also result in reduction of mitral regurgitation. It  will also be important  though to control heart rate. There are data that  suggest that AV junction ablation is a necessary concomitant of benefit  for patients who have CRT with atrial fibrillation and I would be very  quick to pursue this especially if atrial fibrillation is going to be a  permanent thing here as the anticoagulation may preclude any attempts at  cardioversion.   I reviewed the above with him. Please do not hesitate to contact me and  thank you so much for the consultation.    Sincerely,      Johnny Salvia, MD, [facility]  Electronically Signed    SCK/MedQ  DD: 08/04/2006  DT: 08/04/2006  Job #: [id number]   CC:    Johnny Nora, MD

## 2010-08-29 NOTE — Assessment & Plan Note (Signed)
[name] OFFICE NOTE   [name], [name]                    MRN:          [id number]  DATE:06/29/2006                            DOB:          [birth year]    PRIMARY CARE PHYSICIAN:  Dr. Kerby Nora and Dr. Laurita Quint.   INTERVAL HISTORY:  [name] is a delightful 77 year old male with a  history of congestive heart failure secondary to nonischemic  cardiomyopathy.  He also has a history of chronic atrial fibrillation,  severe COPD, diabetes, left bundle branch block, and anemia secondary to  GI bleeding with negative EGD and capsule endoscopy.   He returns today for routine followup.   He is having somewhat of a difficult time.  He says he gets short of  breath with just mild exertion when walking 30 yards to the mailbox.  He  has to stop several times.  He did try to walk up the steps today at our  building and had to stop multiple times on the way up the steps.  He  says this is chronic.  He feels that his heart beats faster when he is  walking, but he denies palpitations at other times.  He has not had any  chest pain.  No syncope or pre-syncope.  He did have 1 episode of PND  several weeks ago and since that time increased his Lasix to 80 mg a day  and has not had any further problems with PND or lower extremity edema.  He denies any wheezing or coughing.  No melena or bright red blood per  rectum.   CURRENT MEDICATIONS:  1. Lisinopril 20.  2. Digoxin 0.125 a day.  3. Aspirin 325.  4. Coreg 12.5 b.i.d.  5. Protonix 40 a day.  6. Flomax.  7. Potassium 20 a day.  8. Lipitor 20 a day.  9. Spiriva.  10.Lasix 80 a day.  11.Also iron 325 t.i.d.   PHYSICAL EXAM:  He is an elderly male in no acute distress.  He  ambulates around the clinic slowly without any respiratory difficulties.  Blood pressure is 120/60, heart rate is 65.  Weight is 199.  HEENT:  Sclerae anicteric.  EOMI.  There  is no xanthelasma.  Mucous  membranes are moist.  Oropharynx clear.  NECK:  Supple.  No JVD.  There is a very soft right carotid bruit.  CARDIAC:  He is irregularly irregular with no S3.  There is a soft  apical murmur.  LUNGS:  Decreased aim movement throughout, but no wheezes or rales.  ABDOMEN:  Obese, soft, nontender.  Nondistended.  No hepatosplenomegaly.  No bruits or masses appreciated.  EXTREMITIES:  Warm with no cyanosis, clubbing, or edema.  NEUROLOGIC:  Alert and oriented x3.  Cranial nerves 2-12 are intact.  Moves all 4 extremities without difficulty.  Affect is very pleasant.   EKG:  Shows atrial fibrillation with a left bundle branch block with a  ventricular rate of 73 beats per minute.  QRS duration is 142 ms.   ACCESSORY DATA:  Echocardiogram from June 21 2006  showed an EF of 20-  25% with moderate mitral regurgitation.  EF is down from 35-40%  previously.   ASSESSMENT AND PLAN:  1. Congestive heart failure secondary to nonischemic cardiomyopathy.      His left ventricular function appears to be worsening.  He      currently has New [place] Heart Association class III to IIIb      symptoms.  We will proceed with a cardiopulmonary stress test to      sort out if this is primarily pulmonary or congestive heart      failure.  Suspect he does have a significant component of heart      failure.  Given his left bundle branch block, he may be a good      candidate for cardiac resynchronization therapy with a      biventricular implantable cardioverter defibrillator.  We will      attempt to increase his Coreg to 18.75 mg b.i.d.  However, I warned      him that he may have some worsening of his heart failure symptoms      and to watch for fluid overload.  2. Atrial fibrillation.  Seems well-controlled.  We will put a 48 hour      monitor on him to make sure that he is not having episodes of rapid      ventricular response or significant bradycardia.   DISPOSITION:  I will  have him return to clinic in 4 weeks for further  evaluation.     Johnny Buckles. Bensimhon, MD  Electronically Signed    DRB/MedQ  DD: 06/29/2006  DT: 06/29/2006  Job #: [id number]   cc:   Kerby Nora, MD  Arta Silence, MD

## 2010-08-29 NOTE — Op Note (Signed)
NAME:  RAAD, CLAYSON                       ACCOUNT NO.:  [id number]   MEDICAL RECORD NO.:  [id number]                   PATIENT TYPE:  INP   LOCATION:  5529                                 FACILITY:  [facility]   PHYSICIAN:  Lina Sar, M.D. [facility]               DATE OF BIRTH:  [birth year]   DATE OF PROCEDURE:  DATE OF DISCHARGE:                                 OPERATIVE REPORT   PROCEDURE:  Upper endoscopy.   INDICATIONS FOR PROCEDURE:  This 77 year old gentleman who was admitted with  hematemesis and weakness, history of bloating and epigastric soreness.  He  induced vomiting on the night of admission.  After several bouts of  retching, he developed bright red hematemesis.  His hemoglobin on admission  was 11 g, BUN 49.  He was transfused two units of packed cells.  He is now  undergoing upper endoscopy to further evaluate his hematemesis.   ENDOSCOPE:  Fujinon single channel video endoscope.   SEDATION:  Versed 10 mg IV.   FINDINGS:  Esophagus:  Fujinon single channel video endoscope was passed  into the pharynx and then posterior pharynx into the esophagus. The patient  was monitored by pulse oximeter.  His oxygen saturation was satisfactory.  He was quite anxious during the procedure and poorly cooperative.  Vallecula, piriform sinus and glottis were normal.  Normal esophageal mucosa  throughout the proximal and mid esophagus.  Squamocolumnar junction at 35 cm  from the incisors was rather indiscrete.  It was difficult to see the entire  GE junction because of irregularity of the squamocolumnar junction.  Multiple biopsies were taken to rule out Barrett's esophagus.  There was no  mass and no bleeding.   Stomach:  The stomach was insufflated with air and showed a 3 cm of  esophagitis going into a sliding hiatal hernia extending from 35 to 38 cm  from the incisors.  Gastric folds were normal.  Gastric antrum and pyloric  outlet was unremarkable.  Retroflexion of endoscope  confirmed presence of  hiatal hernia.  There was no evidence of Mallory-Weiss tear from forward or  retroflex view.   Duodenum:  Duodenum, duodenal bulb and descending duodenum was unremarkable.   IMPRESSION:  1. Chronic esophagitis, status post biopsies to rule out Barrett's     esophagitis.  2. A 3 cm hiatal hernia.  No evidence of active bleeding.    PLAN:  1. We will observe the patient's H&H today and stool Hemoccults.  He may     possibly be discharged tomorrow.  2. If the blood count begins to drop, we will have to proceed with     colonoscopy.  Lina Sar, M.D. [facility]    DB/MEDQ  D:  05/22/2002  T:  05/22/2002  Job:  [id number]   cc:   Venita Lick. Pleas Koch., M.D. [facility]  [address]  [place]  [place] [address]  Fax: 1

## 2010-08-29 NOTE — Assessment & Plan Note (Signed)
[facility] OFFICE NOTE   [name]                    MRN:          [id number]  DATE:03/08/2006                            DOB:          16-Mar-1931    PRIMARY CARE PHYSICIAN:  Kerby Nora, M.D. at [facility].   GASTROENTEROLOGIST:  Hedwig Morton. Juanda Diaz, M.D.   PATIENT IDENTIFICATION:  [name] is a very pleasant 77 year old male  who returns today for a post hospitalization follow-up.   PROBLEM LIST:  1. Congestive heart failure secondary to nonischemic cardiomyopathy.      a.     Echocardiogram in October of 2007 with an EF of 20-25%.      b.     Most recent echo on March 02, 2006, with EF of 35-40% with       moderate mitral regurgitation.      c.     Cardiac catheterization in October of 2007, mild nonobstructive       coronary artery disease.  2. Moderate to severe mitral regurgitation.  3. Atrial fibrillation, rate controlled.  4. Iron deficiency anemia with recent hospitalization for worsening anemia      in November of 2007.      a.     Esophagogastroduodenoscopy showed a hiatal hernia, no active       bleeding.      b.     Status post capsule endoscopy, results pending.  5. Chronic obstructive pulmonary disease.  Quit smoking since October of      2007.  6. Hypertension.  7. Hyperlipidemia.  8. Left bundle branch block.  9. Previous thoracotomy for lung nodule.  10.Hyperglycemia/glucose intolerance.  Likely new onset diabetes.  11.Right carotid bruit.      a.     Status post carotid ultrasound showed 0-39% lesions bilaterally.       There was some question of abnormal right vertebral artery flow.  This       was in November of 2007.   CURRENT MEDICATIONS:  1. Digoxin 0.25 mg a day.  2. Coreg 12.5 b.i.d.  3. Protonix 40 a day.  4. Flomax 0.4 a day.  5. Potassium 20 a day.  6. Lisinopril 10 a day.  7. Lipitor 20 a day.  8. Spiriva one a day.  9. Iron 325 mg  t.i.d.  10.Aspirin and Coumadin on hold.  11.Xopenex inhaler p.r.n.   INTERVAL HISTORY:  [name] returns today for a post hospital follow-up.  He was admitted for weakness and severe iron deficiency anemia with a  hemoglobin of 7.5.  He received 2 units of packed cells and underwent EGD by  [name] which did not show any evidence of bleeding.  He also then  underwent a capsule endoscopy and results are still pending.  He has not had  any further bleeding.  He feels much better, no shortness of breath, no  chest pain.  Exercise tolerance is much improved.  He does note minimal  lower extremity edema at night, but this is resolved in the morning.  No  orthopnea  or PND.  He does have occasional palpitations.   PHYSICAL EXAMINATION:  VITAL SIGNS:  Blood pressure 116/64 with heart rate  87, weight 198 which is stable.  GENERAL:  Well-appearing in no acute distress.  Ambulates in the clinic  without any respiratory difficulty.  HEENT:  Sclerae anicteric, EOMI.  There are no xanthelasmas.  Mucous  membranes are moist.  Oropharynx is clear.  NECK:  Supple.  JVP is about 7 cm of water.  Carotids are 2+ bilaterally.  There is a soft bruit on the right.  HEART:  Distant heart sounds.  He is irregularly irregular.  There is no S3.  Soft apical systolic murmur.  LUNGS:  Decreased movement throughout, but no wheezes or rales.  ABDOMEN:  Soft, nontender, nondistended.  No hepatosplenomegaly, no bruits,  no masses.  EXTREMITIES:  Warm with no cyanosis or clubbing.  There is trace edema  bilaterally.  NEUROLOGY:  Alert and oriented x3.  Cranial nerves II-XII grossly intact.  Moves all four extremities without difficulty.  Affect is bright.   ASSESSMENT:  1. Congestive heart failure.  This is well compensated.  We will not make      any medicine changes at this point until we make sure his      gastrointestinal bleeding issues are resolved.  He has had some      improvement in his ejection  fraction.  I would consider him NYHA class      2-3.  2. Atrial fibrillation.  This is chronic.  His rate is well controlled.  I      would like to get him back on Coumadin if at all possible or at least      325mg  ASA.  However, understandably he is quite hesitant about this.      He will go see [name]. [name], M.D. on Thursday in GI, to discuss this      further.  We will get him back on aspirin 81 mg a day pending Dr.      Lamar Diaz decision.  3. Hypertension.  This is well controlled.  4. Hyperlipidemia.  He will be due for lipids at the next visit.  5. Gastrointestinal bleeding as per [name] and [name].  6. New onset diabetes.  He is due to follow up with [name],      M.D. today and hopefully they will discuss this further.   DISPOSITION:  Return to clinic in 3-4 weeks for routine follow-up and  hopefully we can begin to titrate his heart failure medications.     Johnny Buckles. Bensimhon, MD  Electronically Signed    DRB/MedQ  DD: 03/08/2006  DT: 03/08/2006  Job #: [id number]   cc:   Kerby Nora, MD  Hedwig Morton. Juanda Chance, MD

## 2010-08-29 NOTE — Cardiovascular Report (Signed)
[name], [name]             ACCOUNT NO.:  [id number]   MEDICAL RECORD NO.:  [id number]          PATIENT TYPE:  INP   LOCATION:  2925                         FACILITY:  [facility]   PHYSICIAN:  [name], MDDATE OF BIRTH:  [birth year]   DATE OF PROCEDURE:  01/25/2006  DATE OF DISCHARGE:                              CARDIAC CATHETERIZATION   PATIENT IDENTIFICATION:  [name] is a delightful 77 year old male with  history of COPD and ongoing tobacco use who was admitted with heart failure  in the setting of atrial fibrillation with rapid ventricular response.  He  has been rate controlled.  Echocardiogram showed an EF of 20-25% and he is  referred for diagnostic angiography to rule out underlying coronary disease.   PROCEDURES PERFORMED:  1. Right heart catheterization.  2. Left heart catheterization.  3. Abdominal aortogram.  4. Selective coronary angiography.  5. Femoral artery Angio-Seal.   DESCRIPTION OF PROCEDURE:  The risks, benefits of the catheterization were  explained.  Consent was signed and placed on the chart.  A 5-French arterial  sheath was placed in the right femoral artery using a modified Seldinger  technique.  A JL-4 was used to image the left coronary system.  Angled  pigtail was used for left ventriculogram and abdominal aortogram.  There was  significant tortuosity of the iliac system and catheter manipulation was  difficult.  We tried multiple catheters to intubate the right coronary  artery which were unsuccessful.  We were finally able to image the RCA with  multipurpose A2 catheter.  At the end of the procedure, the right femoral  arteriotomy site was closed with an Angio-Seal closure device.  There was  good hemostasis.  A 7-French venous sheath was placed in the right femoral  artery using modified Seldinger technique and standard Swan-Ganz catheter  was used for the right heart catheterization.  There were no apparent  complications.   HEMODYNAMIC RESULTS:  Right atrial pressure mean of 18.  RV pressure 46/12.  PA pressure 49/27 with a mean of 40.  Pulmonary capillary wedge pressure was  mean of 32 with V-waves up to 45.  Fick cardiac output was 5.8 liters per  minute.  Cardiac index was 3.0 liters per minute per meter squared.  Pulmonary arterial saturation over 58% at 63% per an average of 61%.  Saturation was 91% on room air.  Pulmonary vascular resistance was 1.4 Woods  units.   Left main was normal.   LAD was a long vessel coursing to the apex.  It gave off three small to  moderate-sized diagonals.  There was some calcification in the mid-LAD.  There was a 20% mid-stenosis.   Left circumflex was a large system.  It gave off a large branching OM1 and a  small OM2.  There was a 20-30% lesion in the proximal portion of the left  circumflex.   Right coronary artery was a moderate-sized vessel with what appeared to be a  low anterior takeoff.  There was a moderate-sized branching PDA and a small  posterolateral.  There was a 30% lesion in the mid-right coronary.  The left ventriculogram done in the RAO position showed an EF of 25%.  This  was somewhat a bit to ascertain given the significant ectopy.  There was a  3+ mitral regurgitation.   Abdominal aortogram showed patent renal arteries bilaterally with tortuous  abdominal aortoiliac system.  No obvious aneurysm.   ASSESSMENT:  1. Minimal nonobstructive coronary artery disease.  2. Severe left ventricular dysfunction as described above.  This is likely      a tachycardiac-induced cardiomyopathy.  3. 3+ mitral regurgitation.  4. Increased filling pressures.   PLAN:  1. We will proceed with medical therapy of his atrial fibrillation and his      nonischemic cardiomyopathy including ACE inhibitor and beta blocker.  2. We will plan possible cardioversion as an outpatient after 4 weeks of      therapeutic anticoagulation.  3. He will need more aggressive  diuresis watching his BUN and creatinine      closely.      [name], MD  Electronically Signed     DRB/MEDQ  D:  01/25/2006  T:  01/26/2006  Job:  [id number]
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
## 2010-08-29 NOTE — Discharge Summary (Signed)
NAMEGERARD, [name]             ACCOUNT NO.:  [id number]   MEDICAL RECORD NO.:  [id number]          PATIENT TYPE:  INP   LOCATION:  4740                         FACILITY:  [facility]   PHYSICIAN:  Sandford Craze, NP DATE OF BIRTH:  08-26-1930   DATE OF ADMISSION:  02/18/2006  DATE OF DISCHARGE:  02/22/2006                                 DISCHARGE SUMMARY   DISCHARGE DIAGNOSES:  1. Acute blood loss anemia with history of iron deficiency on Coumadin.      Suspect subacute GI bleed.  2. History of systolic heart failure/ischemic cardiomyopathy.  3. Hypotension, like exacerbated by anemia.  4. Hyperglycemia.  5. Chronic atrial fibrillation.  Coumadin on hold pending GI evaluation as      an outpatient.   PAST MEDICAL HISTORY:  1. Systolic CHF, chronic, secondary to nonischemic cardiomyopathy.  2. Atrial fibrillation.  3. COPD.  4. Chronic anemia.  5. 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  6. BPH.  7. Dyslipidemia.   HOSPITAL COURSE:  1. Acute blood loss anemia with history of iron deficiency on Coumadin,      suspect subacute GI bleed.  The patient was admitted and was noted on      admission to have a hemoglobin of 8.0 on admission.  The patient had 2      units of packed red blood cells and his hemoglobin rose to 9.5.  He was      seen in consultation by [facility], Dr. Lina Sar, and underwent an      endoscopy performed on February 19, 2006, which showed a hiatal hernia      and no active bleeding.  At this time, he remains hemodynamically      stable and he has been scheduled for an outpatient capsule endoscopy on      February 26, 2006, with a followup on March 11, 2006, with Dr. Yancey Flemings.  He was noted to be iron deficient and was given IV dextran      during this admission of 500 mg IV daily for 3 doses.  Coumadin has      been held and will continue to be held until further evaluation by GI      and further  instructions per Dr. Gala Romney.  2. Hypotension.  The patient was noted to have some mild hypotension      during this admission, which was asymptomatic and likely secondary to      chronic Lasix and beta blocker/ACE inhibitor.  We will defer any      changes in cardiac meds/dosing to Dr. Nicholes Mango, as the patient is      asymptomatic.  3. Hyperglycemia.  The patient was placed on sliding scale during this      admission and his blood sugars have remained under 200.  This will need      continued outpatient monitoring and has been exacerbated in the past by      steroids.   DISCHARGE MEDICATIONS:  1. The patient was  instructed to hold Coumadin until further instructions      from Dr. Gala Romney of cardiology.  He was instructed not to take any      iron until after his capsule study on February 26, 2006.  2. Spiriva 18 mcg inhalation once daily.  3. K-Dur 20 mEq p.o. daily.  4. Lisinopril 10 mg p.o. daily.  5. Digoxin 0.25 mg p.o. daily.  6. Coreg 12.5 mg p.o. b.i.d.  7. Flomax 0.8 mg p.o. daily.  8. Lipitor 20 p.o. mg p.o. daily.  9. Aspirin 81 mg p.o. daily.  10.Lasix 40 mg p.o. daily.   PERTINENT DISCHARGE LABORATORY DATA:  Hemoglobin is 10.  Hematocrit is 31.  INR is 1.1.   FOLLOWUP:  The patient was instructed to follow up with Dr. Clelia Croft in 1 to 2  weeks and call for an appointment.  He is instructed to follow up with [facility] for a capsule endoscopy on February 26, 2006.  He is also to  follow up with Dr. Yancey Flemings on March 11, 2006, at 8:45.  The patient  had to reschedule his cardiology appointment due to a family obligation and  a followup appointment has been scheduled with Dr. Nicholes Mango at 10:15  a.m. on March 08, 2006, at the office in [place].   CONDITION ON DISCHARGE:  Condition at time of discharge is noted to be  stable and improved.      Sandford Craze, NP     MO/MEDQ  D:  02/22/2006  T:  02/23/2006  Job:  [id number]   cc:    [name], MD  E. Graceann Congress, MD, [facility]  Wilhemina Bonito. Eda Keys., MD  Arta Silence, MD

## 2010-09-12 LAB — PSA: PSA: 0.85

## 2010-09-24 ENCOUNTER — Encounter: Payer: Self-pay | Admitting: Internal Medicine

## 2010-09-26 ENCOUNTER — Encounter: Payer: Self-pay | Admitting: Internal Medicine

## 2010-09-30 ENCOUNTER — Ambulatory Visit: Payer: Self-pay | Admitting: Internal Medicine

## 2010-10-01 ENCOUNTER — Encounter: Payer: Self-pay | Admitting: Internal Medicine

## 2010-10-13 ENCOUNTER — Ambulatory Visit (INDEPENDENT_AMBULATORY_CARE_PROVIDER_SITE_OTHER): Payer: Medicare Other | Admitting: *Deleted

## 2010-10-13 ENCOUNTER — Other Ambulatory Visit: Payer: Self-pay

## 2010-10-13 DIAGNOSIS — I509 Heart failure, unspecified: Secondary | ICD-10-CM

## 2010-10-13 DIAGNOSIS — I5023 Acute on chronic systolic (congestive) heart failure: Secondary | ICD-10-CM

## 2010-10-13 DIAGNOSIS — I428 Other cardiomyopathies: Secondary | ICD-10-CM

## 2010-10-13 LAB — REMOTE ICD DEVICE
AL IMPEDENCE ICD: 494 Ohm
ATRIAL PACING ICD: 0 pct
BATTERY VOLTAGE: 3.0639 V
BRDY-0002LV: 70 {beats}/min
LV LEAD IMPEDENCE ICD: 475 Ohm
PACEART VT: 0
TOT-0002: 1
TOT-0006: 20110107000000
TZAT-0001ATACH: 2
TZAT-0001FASTVT: 1
TZAT-0004SLOWVT: 8
TZAT-0004SLOWVT: 8
TZAT-0005SLOWVT: 88 pct
TZAT-0005SLOWVT: 91 pct
TZAT-0011SLOWVT: 10 ms
TZAT-0011SLOWVT: 10 ms
TZAT-0012ATACH: 150 ms
TZAT-0012ATACH: 150 ms
TZAT-0012FASTVT: 200 ms
TZAT-0012SLOWVT: 200 ms
TZAT-0012SLOWVT: 200 ms
TZAT-0013SLOWVT: 2
TZAT-0018ATACH: NEGATIVE
TZAT-0018FASTVT: NEGATIVE
TZAT-0020ATACH: 1.5 ms
TZAT-0020ATACH: 1.5 ms
TZAT-0020FASTVT: 1.5 ms
TZAT-0020SLOWVT: 1.5 ms
TZON-0003ATACH: 350 ms
TZON-0003SLOWVT: 350 ms
TZON-0003VSLOWVT: 450 ms
TZON-0004SLOWVT: 28
TZON-0004VSLOWVT: 20
TZST-0001ATACH: 4
TZST-0001FASTVT: 2
TZST-0001FASTVT: 6
TZST-0001SLOWVT: 5
TZST-0001SLOWVT: 6
TZST-0002FASTVT: NEGATIVE
TZST-0002FASTVT: NEGATIVE
TZST-0003SLOWVT: 35 J
TZST-0003SLOWVT: 35 J
VENTRICULAR PACING ICD: 98.98 pct

## 2010-10-14 NOTE — Progress Notes (Signed)
icd w/icm remote check

## 2010-10-16 ENCOUNTER — Encounter: Payer: Medicare Other | Admitting: *Deleted

## 2010-10-20 ENCOUNTER — Encounter: Payer: Self-pay | Admitting: *Deleted

## 2010-11-03 ENCOUNTER — Ambulatory Visit: Payer: Medicare Other | Admitting: Family Medicine

## 2010-11-05 ENCOUNTER — Encounter: Payer: Self-pay | Admitting: Family Medicine

## 2010-11-05 ENCOUNTER — Ambulatory Visit (INDEPENDENT_AMBULATORY_CARE_PROVIDER_SITE_OTHER): Payer: Medicare Other | Admitting: Family Medicine

## 2010-11-05 DIAGNOSIS — G609 Hereditary and idiopathic neuropathy, unspecified: Secondary | ICD-10-CM

## 2010-11-05 DIAGNOSIS — M79609 Pain in unspecified limb: Secondary | ICD-10-CM

## 2010-11-05 DIAGNOSIS — M79604 Pain in right leg: Secondary | ICD-10-CM

## 2010-11-05 DIAGNOSIS — D649 Anemia, unspecified: Secondary | ICD-10-CM

## 2010-11-05 DIAGNOSIS — M79605 Pain in left leg: Secondary | ICD-10-CM | POA: Insufficient documentation

## 2010-11-05 DIAGNOSIS — E785 Hyperlipidemia, unspecified: Secondary | ICD-10-CM

## 2010-11-05 DIAGNOSIS — E119 Type 2 diabetes mellitus without complications: Secondary | ICD-10-CM

## 2010-11-05 LAB — CBC WITH DIFFERENTIAL/PLATELET
Basophils Relative: 0.5 % (ref 0.0–3.0)
Eosinophils Absolute: 0.1 10*3/uL (ref 0.0–0.7)
Hemoglobin: 13.1 g/dL (ref 13.0–17.0)
MCHC: 33.5 g/dL (ref 30.0–36.0)
MCV: 94.5 fl (ref 78.0–100.0)
Monocytes Absolute: 1.3 10*3/uL — ABNORMAL HIGH (ref 0.1–1.0)
Neutro Abs: 5.6 10*3/uL (ref 1.4–7.7)
RBC: 4.13 Mil/uL — ABNORMAL LOW (ref 4.22–5.81)

## 2010-11-05 LAB — IBC PANEL
Iron: 57 ug/dL (ref 42–165)
Saturation Ratios: 15.6 % — ABNORMAL LOW (ref 20.0–50.0)

## 2010-11-05 MED ORDER — LISINOPRIL 10 MG PO TABS: 10.0000 mg | ORAL_TABLET | Freq: Every day | ORAL | Status: DC

## 2010-11-05 MED ORDER — CARVEDILOL 6.25 MG PO TABS: 6.2500 mg | ORAL_TABLET | Freq: Two times a day (BID) | ORAL | Status: DC

## 2010-11-05 MED ORDER — GABAPENTIN 300 MG PO CAPS: 300.0000 mg | ORAL_CAPSULE | Freq: Every day | ORAL | Status: DC

## 2010-11-05 NOTE — Patient Instructions (Addendum)
Wear sneakers for better balance and foot pain. Daily stretching exercsies. Stop at front desk to set up arterial dopplers. Continue gabapentin at 300 mg dose. Make note that changes in tablet size for lisinopril, coreg. Also correction made to potassium.

## 2010-11-05 NOTE — Progress Notes (Signed)
  Subjective:    Patient ID: [name], male    DOB: Feb 14, 1931, 77 y.o.   MRN: [id number]  HPI  77 year old male presents for routine follow up.  Peripheral neuropathy: Improvement in burning with higher dose.   Seperately he has had years of intermittant pain in dorsal right foot and pain in right leg with walking. Goes away with rest. Did have nml foot film in 2011. Pain in foot occ occurs when sitting a while in seats with pressure on posterior leg.  DM... Not reevaled today. Due for A1C in 3 months. High cholesterol.. reeval due in 3 months.  Since neck surgery balance has been terrible.  Larey Seat few months ago... Broke tailbone, gradually improving.    Review of Systems  Constitutional: Negative for fever.  HENT: Negative for ear pain.   Eyes: Negative for pain.  Respiratory: Negative for cough, chest tightness and shortness of breath.   Cardiovascular: Negative for chest pain, palpitations and leg swelling.  Gastrointestinal: Negative for diarrhea, constipation and abdominal distention.       Objective:   Physical Exam  Constitutional: Vital signs are normal. He appears well-developed and well-nourished.  HENT:  Head: Normocephalic.  Right Ear: Hearing normal.  Left Ear: Hearing normal.  Nose: Nose normal.  Mouth/Throat: Oropharynx is clear and moist and mucous membranes are normal.  Neck: Trachea normal. Carotid bruit is not present. No mass and no thyromegaly present.  Cardiovascular: Normal rate, regular rhythm and normal pulses.  Exam reveals no gallop, no distant heart sounds and no friction rub.   No murmur heard.      No peripheral edema  Pulmonary/Chest: Effort normal and breath sounds normal. No respiratory distress.  Skin: Skin is warm, dry and intact. No rash noted.  Psychiatric: He has a normal mood and affect. His speech is normal and behavior is normal. Thought content normal.   Diabetic foot exam: Normal inspection No skin breakdown No  calluses  Normal DP pulses Decreased sensation to light touch and monofilament Nails normal        Assessment & Plan:

## 2010-11-10 ENCOUNTER — Ambulatory Visit
Admission: RE | Admit: 2010-11-10 | Discharge: 2010-11-10 | Disposition: A | Discharge status: Home / Self Care | Payer: Medicare Other | Source: Ambulatory Visit | Attending: Neurological Surgery | Admitting: Neurological Surgery

## 2010-11-10 ENCOUNTER — Ambulatory Visit: Payer: Medicare Other | Admitting: Family Medicine

## 2010-11-10 ENCOUNTER — Other Ambulatory Visit: Payer: Self-pay | Admitting: Neurological Surgery

## 2010-11-10 DIAGNOSIS — IMO0002 Reserved for concepts with insufficient information to code with codable children: Secondary | ICD-10-CM

## 2010-11-10 DIAGNOSIS — M545 Low back pain, unspecified: Secondary | ICD-10-CM

## 2010-11-11 ENCOUNTER — Encounter: Payer: Self-pay | Admitting: Internal Medicine

## 2010-11-11 ENCOUNTER — Ambulatory Visit (INDEPENDENT_AMBULATORY_CARE_PROVIDER_SITE_OTHER): Payer: Medicare Other | Admitting: Family Medicine

## 2010-11-11 ENCOUNTER — Encounter: Payer: Self-pay | Admitting: Family Medicine

## 2010-11-11 DIAGNOSIS — R059 Cough, unspecified: Secondary | ICD-10-CM | POA: Insufficient documentation

## 2010-11-11 DIAGNOSIS — R05 Cough: Secondary | ICD-10-CM

## 2010-11-11 DIAGNOSIS — R062 Wheezing: Secondary | ICD-10-CM

## 2010-11-11 MED ORDER — ALBUTEROL SULFATE HFA 108 (90 BASE) MCG/ACT IN AERS: 2.0000 | INHALATION_SPRAY | Freq: Four times a day (QID) | RESPIRATORY_TRACT | Status: DC | PRN

## 2010-11-11 MED ORDER — AZITHROMYCIN 250 MG PO TABS: ORAL_TABLET | ORAL | Status: AC

## 2010-11-11 NOTE — Progress Notes (Signed)
Subjective:    Patient ID: [name], male    DOB: 03/01/1931, 77 y.o.   MRN: [id number]  HPI 77 yo new to me with several weeks of worsening productive cough, SOB. No CP. Can hear a little wheezing. Mucinex helping a little. No fevers or chills.  Pt is a smoker and has had pneumonia several times.  Patient Active Problem List  Diagnoses  . ADENOCARCINOMA, PROSTATE  . DIABETES MELLITUS, TYPE II  . UNSPECIFIED VITAMIN D DEFICIENCY  . HYPERLIPIDEMIA  . ANEMIA-IRON DEFICIENCY  . TOBACCO ABUSE  . CAROTIDYNIA  . PERIPHERAL NEUROPATHY  . VENTRICULAR FIBRILLATION  . HEMORRHOIDS, EXTERNAL THROMBOSED  . COPD  . PULMONARY NODULE, RIGHT LOWER LOBE  . BENIGN PROSTATIC HYPERTROPHY  . PNEUMONIA, HX OF  . ABSCESS, PERIRECTAL, HX OF  . CONSTIPATION, CHRONIC  . Pain in joint, ankle and foot  . Pain in limb  . CLOSED FRACTURE OF LATERAL MALLEOLUS  . CLOSED FRACTURE OF CUBOID BONE  . OTHER CLOSED FRACTURE OF TARSAL&METATARSAL BONES  . FRACTURE, TOE  . Leg pain, bilateral  . Cough   Past Medical History  Diagnosis Date  . COPD (chronic obstructive pulmonary disease)     GOLD II; Spirometry 07/10/2008 >FEV1 1.46 56% predicted, ratio of 66%  . Anemia     iron deficiency anemia with previous severe GI bleed   . Hypertension   . Hyperlipidemia   . Left bundle branch block   . Diabetes mellitus   . Prostatic hypertrophy 09-11-97    Benign  . Pneumonia     hx of, 2-3 times as a child   . Dyspnea     dyspnea  . Obesity   . CHF (congestive heart failure)     secondary to nonischemic cardiomyopathy; ECHO 10/07 EF 20-25%, mod to severe MR; ECHO 1/10 EF 55-60%, mild MR; ECHO 2/11 55-65%, S/P Medtronic BiV ICD with biventricular function now turned off due to diahragmatic simulation  . CAD (coronary artery disease)     Mild, nonobstructive  . Atrial fibrillation     s/p AV node ablation; not on coumadin due to GIB  . Cardiac arrest - ventricular fibrillation 02/2008    Aborted,  shocked by ICD   Past Surgical History  Procedure Date  . Lobectomy 01-21-98    lung  . Rotator cuff repair 1994 and 1995  . Knee surgery 1996    Left  . Cardiac catheterization 01/2006    Showed mild nonobstructive CAD  . Cardiac catheterization 11/05/2006    Right atrial pressure mean of 12, RV pressure 36/8, PA pressure 39/16 witha mean of 28, wedge pressure was 20. Fick cardiac output was 5 liters per minute, cardiac index was 2.4  . Esophagogastroduodenoscopy 09/1997    Prepylor ulcer, esoph ring, duod avm  . Colonoscopy w/ polypectomy 11/1997    Divertics, int hemms  . Mch gi bleed 02/07-02/12/2002  . Esophagogastroduodenoscopy 05/22/2002    Poss Barrett's   . Colonoscopy 06/26/2002    Multip (neg) divertics, int hemm  . Colonoscopy 07/10/2004    Poyps, divertics, int hemms  . Mch sob 10/11-10/18/2007    A fib, CHF  . Esophagogastroduodenoscopy 02/19/2006    HH No active bleeding  . Ct head limited w/o cm 10/03/2006    No acute abnmlty  . Coronary angioplasty     Min abstrut zd severe LB dystn EF 25%  . Mch x 11/08-11/03/2006    Acute blood loss, anemia, sys HF, isch cardiomyopathy  .  Hosp 8/14-8/23/2008    Acute on chronis CHF IIIB [facility] Cardiomyop EF 20-25% Mod-Sev MR  . Colonoscopy 06/19/2008    2 polyps divertics int hemms (Dr Marina Goodell)   History  Substance Use Topics  . Smoking status: Current Everyday Smoker -- 1.0 packs/day  . Smokeless tobacco: Not on file   Comment: Smoker since 69, quit for 2 years and started back in 03/2008  . Alcohol Use: Yes   Family History  Problem Relation Age of Onset  . Heart failure Mother     CHF  . Heart failure Father     CHF  . Hypertension Sister   . Obesity Sister   . Cancer Brother     lung cancer  . Lung cancer Brother   . Liver disease Brother     ETOH  . Alcohol abuse Brother   . Cancer Brother   . COPD Brother   . Cancer Brother   . Anxiety disorder Brother   . COPD Brother   . Hypertension Sister      No Known Allergies Current Outpatient Prescriptions on File Prior to Visit  Medication Sig Dispense Refill  . aspirin buffered (ADPRIN B) 325 MG TABS tablet Take 325 mg by mouth daily.        Marland Kitchen atorvastatin (LIPITOR) 20 MG tablet Take 1 tablet (20 mg total) by mouth daily.  90 tablet  3  . carvedilol (COREG) 6.25 MG tablet Take 1 tablet (6.25 mg total) by mouth 2 (two) times daily with a meal.  180 tablet  3  . Cholecalciferol (TH VITAMIN D3) 2000 UNITS CAPS Take by mouth daily.        . colchicine 0.6 MG tablet Take 0.6 mg by mouth. 1 tab by mouth two times a day as needed gout pain       . Cyanocobalamin (VITAMIN B-12) 2500 MCG SUBL Place under the tongue daily.        Marland Kitchen dutasteride (AVODART) 0.5 MG capsule Take 0.5 mg by mouth daily.        Marland Kitchen eplerenone (INSPRA) 25 MG tablet Take 25 mg by mouth. Take 1/2 by mouth daily       . ferrous gluconate (FERGON) 325 MG tablet Take 325 mg by mouth. 1 in the morning and 1 in the evening      . furosemide (LASIX) 40 MG tablet Take 1 tablet (40 mg total) by mouth daily. Take one tablet by mouth daily  90 tablet  3  . gabapentin (NEURONTIN) 300 MG capsule Take 1 capsule (300 mg total) by mouth at bedtime.  90 capsule  3  . glimepiride (AMARYL) 4 MG tablet Take 1 tablet (4 mg total) by mouth daily.  90 tablet  3  . glucose blood (ACCU-CHEK AVIVA) test strip 1 each by Other route. Use 1 daily and as needed for diabetes       . lisinopril (PRINIVIL,ZESTRIL) 10 MG tablet Take 1 tablet (10 mg total) by mouth daily.  90 tablet  3  . Misc. Devices [facility]) MISC by Does not apply route. Length of need= 99 years (perm) DX codes: 825.23, 824.0, 826.0, V45.02       . pantoprazole (PROTONIX) 40 MG tablet Take 40 mg by mouth daily.        . potassium chloride SA (K-DUR,KLOR-CON) 20 MEQ tablet Take 20 mEq by mouth daily.       . Tamsulosin HCl (FLOMAX) 0.4 MG CAPS Take by mouth daily.        Marland Kitchen  tiotropium (SPIRIVA HANDIHALER) 18 MCG inhalation capsule Place 18  mcg into inhaler and inhale daily. 1 inhale every morning          Review of Systems See HPI    Objective:   Physical Exam BP 132/70  Pulse 60  Temp(Src) 97.5 F (36.4 C) (Oral)  Wt 186 lb 4 oz (84.482 kg)  Gen:  Alert, elderly male, no acute distress HEENT:  +PND, no obstruction Resp:  Bilateral basilar crackles, ronchi, faint exp wheezes CVS:  RRR Ext:  No edema      Assessment & Plan:   1. Cough   New - in a smoker with h/o pneumonia. Also complicated by recent abx for kidney infection by urology (?cipro). Advised to restart Spiriva. Start Zpack and proair. Pt to call in 2 days if no improvement of symptoms.

## 2010-11-13 ENCOUNTER — Telehealth: Payer: Self-pay | Admitting: *Deleted

## 2010-11-13 NOTE — Telephone Encounter (Signed)
Spoke with patient and advised as instructed.  He will keep [place] updated.

## 2010-11-13 NOTE — Telephone Encounter (Signed)
Patient saw you recently and was diagnosed with Pneumonia and was told to call with a progress report today.  Daughter Sheralyn Boatman) says he is maybe a little better but not much.  Symptoms are pretty much the same as when he was in the office but no new or worse symptoms.  Please advise.

## 2010-11-13 NOTE — Telephone Encounter (Signed)
Ok symptoms take time and it's a great sign that he is a little better and definitely not worse.  Thank you for the update.  Please keep [place] posted.

## 2010-11-19 ENCOUNTER — Encounter: Payer: Self-pay | Admitting: Internal Medicine

## 2010-11-19 ENCOUNTER — Ambulatory Visit (INDEPENDENT_AMBULATORY_CARE_PROVIDER_SITE_OTHER): Payer: Medicare Other | Admitting: Internal Medicine

## 2010-11-19 VITALS — BP 107/72 | HR 73 | Ht 67.0 in | Wt 182.0 lb

## 2010-11-19 DIAGNOSIS — I420 Dilated cardiomyopathy: Secondary | ICD-10-CM

## 2010-11-19 DIAGNOSIS — I4891 Unspecified atrial fibrillation: Secondary | ICD-10-CM | POA: Insufficient documentation

## 2010-11-19 DIAGNOSIS — I428 Other cardiomyopathies: Secondary | ICD-10-CM

## 2010-11-19 DIAGNOSIS — I5032 Chronic diastolic (congestive) heart failure: Secondary | ICD-10-CM | POA: Insufficient documentation

## 2010-11-19 DIAGNOSIS — F17200 Nicotine dependence, unspecified, uncomplicated: Secondary | ICD-10-CM

## 2010-11-19 NOTE — Progress Notes (Signed)
HPI:  [name] is a 77 year old male with a history of a congestive heart failure secondary to nonischemic cardiac myopathy with a recovered ejection fraction he is also has h/o chronic atrial fib status post AV node ablation and  BIV ICD implantation (not on coumadin due to h/o GIB). Remainder of medical history is notable for COPD, diabetes, hypertension and hyperlipidemia. He returns today for routine followup.  In November 2010, he had syncopal episode and ICD showed VF with appropriate therapy.   Underwent ICD change out in Jan 2011 for ERI without problem. Had echo Feb 2011 ef 55-65%. Had some soreness over site but no drainage or skin breakdown.   Here for f/u. Says overall feels well. No CP. Chronic DOE. No edema. Had neck surgery with Marikay Alar and since that time has had problems with tingling and numbness in arms and legs.   Still smoke 1 PPD.   ROS: All systems negative except as listed in HPI, PMH and Problem List.  Past Medical History  Diagnosis Date  . COPD (chronic obstructive pulmonary disease)     GOLD II; Spirometry 07/10/2008 >FEV1 1.46 56% predicted, ratio of 66%  . Anemia     iron deficiency anemia with previous severe GI bleed   . Hypertension   . Hyperlipidemia   . Left bundle branch block   . Diabetes mellitus   . Prostatic hypertrophy 09-11-97    Benign  . Pneumonia     hx of, 2-3 times as a child   . Dyspnea     dyspnea  . Obesity   . CHF (congestive heart failure)     secondary to nonischemic cardiomyopathy; ECHO 10/07 EF 20-25%, mod to severe MR; ECHO 1/10 EF 55-60%, mild MR; ECHO 2/11 55-65%, S/P [employer] BiV ICD with biventricular function now turned off due to diahragmatic simulation  . CAD (coronary artery disease)     Mild, nonobstructive  . Atrial fibrillation     s/p AV node ablation; not on coumadin due to GIB  . Cardiac arrest - ventricular fibrillation 02/2008    Aborted, shocked by ICD    Current Outpatient Prescriptions    Medication Sig Dispense Refill  . albuterol (PROAIR HFA) 108 (90 BASE) MCG/ACT inhaler Inhale 2 puffs into the lungs every 6 (six) hours as needed for wheezing.  1 Inhaler  0  . aspirin buffered (ADPRIN B) 325 MG TABS tablet Take 325 mg by mouth daily.        Marland Kitchen atorvastatin (LIPITOR) 20 MG tablet Take 1 tablet (20 mg total) by mouth daily.  90 tablet  3  . carvedilol (COREG) 6.25 MG tablet Take 1 tablet (6.25 mg total) by mouth 2 (two) times daily with a meal.  180 tablet  3  . Cholecalciferol (TH VITAMIN D3) 2000 UNITS CAPS Take by mouth daily.        . colchicine 0.6 MG tablet Take 0.6 mg by mouth. 1 tab by mouth two times a day as needed gout pain       . Cyanocobalamin (VITAMIN B-12) 2500 MCG SUBL Place under the tongue daily.        Marland Kitchen dutasteride (AVODART) 0.5 MG capsule Take 0.5 mg by mouth daily.        Marland Kitchen eplerenone (INSPRA) 25 MG tablet Take 25 mg by mouth. Take 1/2 by mouth daily       . ferrous gluconate (FERGON) 325 MG tablet Take 325 mg by mouth. 1 in the morning and 1  in the evening      . furosemide (LASIX) 40 MG tablet Take 1 tablet (40 mg total) by mouth daily. Take one tablet by mouth daily  90 tablet  3  . gabapentin (NEURONTIN) 300 MG capsule Take 1 capsule (300 mg total) by mouth at bedtime.  90 capsule  3  . glimepiride (AMARYL) 4 MG tablet Take 1 tablet (4 mg total) by mouth daily.  90 tablet  3  . glucose blood (ACCU-CHEK AVIVA) test strip 1 each by Other route. Use 1 daily and as needed for diabetes       . lisinopril (PRINIVIL,ZESTRIL) 10 MG tablet Take 1 tablet (10 mg total) by mouth daily.  90 tablet  3  . Misc. Devices [facility]) MISC by Does not apply route. Length of need= 99 years (perm) DX codes: 825.23, 824.0, 826.0, V45.02       . pantoprazole (PROTONIX) 40 MG tablet Take 40 mg by mouth daily.        . potassium chloride SA (K-DUR,KLOR-CON) 20 MEQ tablet Take 20 mEq by mouth daily.       . Tamsulosin HCl (FLOMAX) 0.4 MG CAPS Take by mouth daily.        Marland Kitchen  tiotropium (SPIRIVA HANDIHALER) 18 MCG inhalation capsule Place 18 mcg into inhaler and inhale daily. 1 inhale every morning          PHYSICAL EXAM: Filed Vitals:   11/19/10 1211  BP: 107/72  Pulse: 73   General:  Elderly. Well appearing. No resp difficulty. Walks with cane. HEENT: normal Neck: supple. JVP flat. Carotids 2+ bilaterally; no bruits. No lymphadenopathy or thryomegaly appreciated. Cor: PMI normal. Regular rate & rhythm. No rubs, gallops or murmurs. Lungs: clear with decreased breath sounds throughout Abdomen: soft, nontender, nondistended. No hepatosplenomegaly. No bruits or masses. Good bowel sounds. Extremities: no cyanosis, clubbing, rash, edema Neuro: alert & orientedx3, cranial nerves grossly intact. Moves all 4 extremities w/o difficulty. Affect pleasant.    ECG: AF with V-pacing 73   ASSESSMENT & PLAN:

## 2010-11-19 NOTE — Assessment & Plan Note (Signed)
Chronic. Does not want coumadin due to h/o GIB. Continue ASA 325.

## 2010-11-19 NOTE — Assessment & Plan Note (Signed)
Counseled on need to quit.  

## 2010-11-19 NOTE — Assessment & Plan Note (Addendum)
Resolved. No evidence of symptomatic HF. Will d/c him from HF clinic and he can f/u with Dr. Graciela Husbands for his EP issues.

## 2010-11-19 NOTE — Patient Instructions (Signed)
Your physician wants you to follow-up in: January with Dr Graciela Husbands in [place].  You will receive a reminder letter in the mail two months in advance. If you don't receive a letter, please call our office to schedule the follow-up appointment.

## 2010-11-25 ENCOUNTER — Encounter: Payer: Medicare Other | Admitting: *Deleted

## 2010-12-01 ENCOUNTER — Ambulatory Visit
Admission: RE | Admit: 2010-12-01 | Discharge: 2010-12-01 | Disposition: A | Discharge status: Home / Self Care | Payer: Medicare Other | Source: Ambulatory Visit | Attending: Neurological Surgery | Admitting: Neurological Surgery

## 2010-12-01 DIAGNOSIS — M545 Low back pain: Secondary | ICD-10-CM

## 2010-12-01 NOTE — Assessment & Plan Note (Signed)
Previously well controlled. Re-eval due in 3 months.

## 2010-12-01 NOTE — Assessment & Plan Note (Signed)
IMproved symtpoms with higher dose of

## 2010-12-01 NOTE — Assessment & Plan Note (Addendum)
Re-eval due in 3 months.

## 2010-12-01 NOTE — Assessment & Plan Note (Signed)
Wear sneakers for better balance and foot pain. Daily stretching exercises. Stop at front desk to set up arterial dopplers to rule out PAD.

## 2010-12-02 ENCOUNTER — Encounter: Payer: Medicare Other | Admitting: *Deleted

## 2010-12-23 ENCOUNTER — Encounter (INDEPENDENT_AMBULATORY_CARE_PROVIDER_SITE_OTHER): Payer: Medicare Other | Admitting: *Deleted

## 2010-12-23 DIAGNOSIS — I739 Peripheral vascular disease, unspecified: Secondary | ICD-10-CM

## 2010-12-23 DIAGNOSIS — E1159 Type 2 diabetes mellitus with other circulatory complications: Secondary | ICD-10-CM

## 2010-12-23 DIAGNOSIS — M79605 Pain in left leg: Secondary | ICD-10-CM

## 2011-01-15 ENCOUNTER — Ambulatory Visit (INDEPENDENT_AMBULATORY_CARE_PROVIDER_SITE_OTHER): Payer: Medicare Other | Admitting: *Deleted

## 2011-01-15 ENCOUNTER — Encounter: Payer: Self-pay | Admitting: Internal Medicine

## 2011-01-15 ENCOUNTER — Other Ambulatory Visit: Payer: Self-pay | Admitting: Internal Medicine

## 2011-01-15 DIAGNOSIS — I4891 Unspecified atrial fibrillation: Secondary | ICD-10-CM

## 2011-01-15 DIAGNOSIS — I509 Heart failure, unspecified: Secondary | ICD-10-CM

## 2011-01-15 DIAGNOSIS — I420 Dilated cardiomyopathy: Secondary | ICD-10-CM

## 2011-01-15 DIAGNOSIS — I428 Other cardiomyopathies: Secondary | ICD-10-CM

## 2011-01-16 ENCOUNTER — Encounter: Payer: Self-pay | Admitting: *Deleted

## 2011-01-16 LAB — REMOTE ICD DEVICE
BATTERY VOLTAGE: 3.0366 V
BRDY-0002LV: 70 {beats}/min
BRDY-0004LV: 120 {beats}/min
FVT: 0
LV LEAD IMPEDENCE ICD: 475 Ohm
TOT-0006: 20110107000000
TZAT-0001ATACH: 2
TZAT-0001ATACH: 3
TZAT-0001FASTVT: 1
TZAT-0001SLOWVT: 1
TZAT-0001SLOWVT: 2
TZAT-0002ATACH: NEGATIVE
TZAT-0002FASTVT: NEGATIVE
TZAT-0004SLOWVT: 8
TZAT-0004SLOWVT: 8
TZAT-0012ATACH: 150 ms
TZAT-0012ATACH: 150 ms
TZAT-0012ATACH: 150 ms
TZAT-0012FASTVT: 200 ms
TZAT-0013SLOWVT: 2
TZAT-0018ATACH: NEGATIVE
TZAT-0018ATACH: NEGATIVE
TZAT-0018FASTVT: NEGATIVE
TZAT-0019ATACH: 6 V
TZAT-0020ATACH: 1.5 ms
TZAT-0020SLOWVT: 1.5 ms
TZAT-0020SLOWVT: 1.5 ms
TZON-0003ATACH: 350 ms
TZON-0003VSLOWVT: 450 ms
TZON-0004VSLOWVT: 20
TZST-0001ATACH: 6
TZST-0001FASTVT: 2
TZST-0001FASTVT: 4
TZST-0001FASTVT: 6
TZST-0001SLOWVT: 3
TZST-0001SLOWVT: 5
TZST-0002ATACH: NEGATIVE
TZST-0002ATACH: NEGATIVE
TZST-0002FASTVT: NEGATIVE
TZST-0002FASTVT: NEGATIVE
TZST-0003SLOWVT: 35 J
TZST-0003SLOWVT: 35 J
VENTRICULAR PACING ICD: 98.61 pct
VF: 0

## 2011-01-19 ENCOUNTER — Other Ambulatory Visit: Payer: Self-pay | Admitting: Family Medicine

## 2011-01-20 ENCOUNTER — Encounter: Payer: Self-pay | Admitting: *Deleted

## 2011-01-23 LAB — BASIC METABOLIC PANEL
CO2: 23
Calcium: 8.8
Calcium: 9.2
Chloride: 105
Chloride: 109
Creatinine, Ser: 1.15
Creatinine, Ser: 1.22
GFR calc Af Amer: >60
GFR calc Af Amer: >60
GFR calc Af Amer: >60
GFR calc non Af Amer: 52 — ABNORMAL LOW
GFR calc non Af Amer: >60
Potassium: 4.3
Potassium: 4.4
Sodium: 138
Sodium: 138

## 2011-01-23 LAB — CBC
HCT: 28.3 — ABNORMAL LOW
Hemoglobin: 9.1 — ABNORMAL LOW
Hemoglobin: 9.4 — ABNORMAL LOW
MCHC: 33.3
RBC: 3.16 — ABNORMAL LOW
RBC: 3.28 — ABNORMAL LOW
RDW: 15.7 — ABNORMAL HIGH
WBC: 10.4

## 2011-01-23 LAB — PROTIME-INR
INR: 1.1
Prothrombin Time: 14.1

## 2011-01-26 LAB — COMPREHENSIVE METABOLIC PANEL
AST: 18
BUN: 27 — ABNORMAL HIGH
CO2: 25
Calcium: 9
Chloride: 104
Creatinine, Ser: 1.36
GFR calc Af Amer: >60
GFR calc non Af Amer: 51 — ABNORMAL LOW
Glucose, Bld: 128 — ABNORMAL HIGH
Total Bilirubin: 0.4

## 2011-01-26 LAB — POCT I-STAT 3, VENOUS BLOOD GAS (G3P V)
Acid-base deficit: 7 — ABNORMAL HIGH
Bicarbonate: 18.4 — ABNORMAL LOW
Bicarbonate: 22.2
Operator id: 194801
Operator id: 194801
pCO2, Ven: 38.1 — ABNORMAL LOW
pH, Ven: 7.32 — ABNORMAL HIGH
pH, Ven: 7.373 — ABNORMAL HIGH
pO2, Ven: 36

## 2011-01-26 LAB — CBC
HCT: 29.1 — ABNORMAL LOW
Hemoglobin: 9.9 — ABNORMAL LOW
MCHC: 34
MCV: 85.7
RBC: 3.4 — ABNORMAL LOW
WBC: 10.6 — ABNORMAL HIGH

## 2011-01-26 LAB — POCT I-STAT 3, ART BLOOD GAS (G3+)
Operator id: 194801
TCO2: 21
pCO2 arterial: 33.1 — ABNORMAL LOW
pH, Arterial: 7.394

## 2011-01-26 LAB — TSH: TSH: 1.58

## 2011-01-26 NOTE — Progress Notes (Signed)
icd remote check  

## 2011-01-28 LAB — DIFFERENTIAL
Eosinophils Relative: 2
Lymphocytes Relative: 19
Monocytes Absolute: 1.2 — ABNORMAL HIGH
Monocytes Relative: 11
Neutro Abs: 7.2

## 2011-01-28 LAB — POCT I-STAT CREATININE
Creatinine, Ser: 1.3
Operator id: 270111

## 2011-01-28 LAB — POCT CARDIAC MARKERS
CKMB, poc: 3
Myoglobin, poc: 111
Operator id: 270111
Troponin i, poc: <0.05

## 2011-01-28 LAB — I-STAT 8, (EC8 V) (CONVERTED LAB)
Acid-Base Excess: 2
BUN: 18
Bicarbonate: 28.6 — ABNORMAL HIGH
Chloride: 104
Glucose, Bld: 82
HCT: 37 — ABNORMAL LOW
Hemoglobin: 12.6 — ABNORMAL LOW
Operator id: 270111
Potassium: 3.8
Sodium: 140
TCO2: 30
pCO2, Ven: 49.9
pH, Ven: 7.366 — ABNORMAL HIGH

## 2011-01-28 LAB — CBC
HCT: 34 — ABNORMAL LOW
Hemoglobin: 11.2 — ABNORMAL LOW
RBC: 4.04 — ABNORMAL LOW

## 2011-02-05 ENCOUNTER — Encounter: Payer: Self-pay | Admitting: Family Medicine

## 2011-02-05 ENCOUNTER — Ambulatory Visit (INDEPENDENT_AMBULATORY_CARE_PROVIDER_SITE_OTHER): Payer: Medicare Other | Admitting: Family Medicine

## 2011-02-05 DIAGNOSIS — C61 Malignant neoplasm of prostate: Secondary | ICD-10-CM

## 2011-02-05 DIAGNOSIS — J449 Chronic obstructive pulmonary disease, unspecified: Secondary | ICD-10-CM

## 2011-02-05 DIAGNOSIS — M79609 Pain in unspecified limb: Secondary | ICD-10-CM

## 2011-02-05 DIAGNOSIS — J4489 Other specified chronic obstructive pulmonary disease: Secondary | ICD-10-CM

## 2011-02-05 DIAGNOSIS — Z23 Encounter for immunization: Secondary | ICD-10-CM

## 2011-02-05 DIAGNOSIS — G609 Hereditary and idiopathic neuropathy, unspecified: Secondary | ICD-10-CM

## 2011-02-05 DIAGNOSIS — E785 Hyperlipidemia, unspecified: Secondary | ICD-10-CM

## 2011-02-05 DIAGNOSIS — M79604 Pain in right leg: Secondary | ICD-10-CM

## 2011-02-05 DIAGNOSIS — I4891 Unspecified atrial fibrillation: Secondary | ICD-10-CM

## 2011-02-05 DIAGNOSIS — E119 Type 2 diabetes mellitus without complications: Secondary | ICD-10-CM

## 2011-02-05 NOTE — Progress Notes (Signed)
  Subjective:    Patient ID: [name], male    DOB: Aug 19, 1930, 77 y.o.   MRN: [id number]  HPI [name] is a 77 year old male with a history of a congestive heart failure secondary to nonischemic cardiac myopathy with a recovered ejection fraction he is also has h/o chronic atrial fib status post AV node ablation and BIV ICD implantation (not on coumadin due to h/o GIB). Remainder of medical history is notable for COPD, diabetes, hypertension and hyperlipidemia.  Recently saw Dr. B cardiology on 11/2010...reviewed OV note in detail. Has appt with Dr. Graciela Husbands on 04/2011. Cardiomyopathy considered resolved. Afib RVR: Chronic. Does not want coumadin due to h/o GIB. Continue ASA 325.  Diabetes:  Due for A1C... Using medications without difficulties: Hypoglycemic episodes:None Hyperglycemic episodes:None Feet problems: peripheral neuropathy, pain, no lesions Blood Sugars averaging:FBS 101 eye exam within last year:no  Elevated Cholesterol: due for lipids. Was well controlled at last check on lipitor 20 mg daily. Using medications without problems:no.. No improvement in leg pain off statin. Muscle aches: None Diet compliance:Moderate Exercise:occ Other complaints:  Hypertension:   At goal BP less than 130/80 On multiple meds. Using medication without problems or lightheadedness:  Chest pain with exertion: None Edema:None Short of breath:stable. Average home BPs: Other issues:   COPD:  He feels like his lungs are doing well. On spiriva after COPD exac in 10/2010. Continues to smoke. Using proair prn.  Not interested in quitting smoking.  HAs appt with Dr. Yetta Barre for low back pain with radiculopathy to right leg next week. Has chronic leg and back pain. Peripheral neuropathy: stable on neurontin. ABIs in legs nml.    Prostate cancer: PSA followed by urologist every 90 days.   Review of Systems  Constitutional: Negative for fever and fatigue.  HENT: Negative for ear pain.    Eyes: Negative for pain.  Respiratory: Negative for shortness of breath and wheezing.   Cardiovascular: Negative for chest pain.  Gastrointestinal: Negative for abdominal distention.  Genitourinary: Negative for dysuria.  Musculoskeletal: Positive for back pain, arthralgias and gait problem.       Objective:   Physical Exam  Constitutional: He is oriented to person, place, and time. Vital signs are normal. He appears well-developed and well-nourished.  HENT:  Head: Normocephalic.  Right Ear: Hearing normal.  Left Ear: Hearing normal.  Nose: Nose normal.  Mouth/Throat: Oropharynx is clear and moist and mucous membranes are normal.  Neck: Trachea normal. Carotid bruit is not present. No mass and no thyromegaly present.  Cardiovascular: Normal rate, regular rhythm and normal pulses.  Exam reveals no gallop, no distant heart sounds and no friction rub.   No murmur heard.      No peripheral edema  Pulmonary/Chest: Effort normal and breath sounds normal. No respiratory distress.  Neurological: He is alert and oriented to person, place, and time.  Skin: Skin is warm, dry and intact. No rash noted.  Psychiatric: He has a normal mood and affect. His speech is normal and behavior is normal. Thought content normal.    Diabetic foot exam: Normal inspection except 2nd digit crosses over great toe on right foot No skin breakdown No calluses  Normal DP pulses decreased sensation to light touch and monofilament Nails thickened.      Assessment & Plan:

## 2011-02-05 NOTE — Assessment & Plan Note (Signed)
Likely well controlled... Due for A1C. Encouraged exercise, weight loss, healthy eating habits.

## 2011-02-05 NOTE — Assessment & Plan Note (Signed)
Hx of GIB, anticoag on asa 325.

## 2011-02-05 NOTE — Assessment & Plan Note (Signed)
Due for re-eval.. LDL  <70 at last check on statin.

## 2011-02-05 NOTE — Assessment & Plan Note (Signed)
Stable control on Spiriva. Refuses smoking cessation. Check PFTS at next CPX in 6 months.

## 2011-02-05 NOTE — Patient Instructions (Addendum)
Return in next few days for fasting labs. We will call with the results.  Make sure to see eye MD once a year. Work on exercise, healthy diet. Discuss with Dr. Yetta Barre possibility of leg pain being from back... Would he agree with increasing neurontin further?

## 2011-02-05 NOTE — Assessment & Plan Note (Signed)
Stable control, but may benefit from increase in neurontin. He will discuss with Dr. Yetta Barre at appt to eval low back pain, chronic.

## 2011-02-05 NOTE — Assessment & Plan Note (Signed)
Has appt in 1 month for PSA.

## 2011-02-05 NOTE — Assessment & Plan Note (Addendum)
Normal ABIs.. Likely due to neuropathy and radiculopathy.  no improvement off statin med temporarily.

## 2011-02-10 ENCOUNTER — Other Ambulatory Visit: Payer: Medicare Other

## 2011-03-12 ENCOUNTER — Other Ambulatory Visit: Payer: Self-pay | Admitting: *Deleted

## 2011-03-12 MED ORDER — POTASSIUM CHLORIDE CRYS ER 20 MEQ PO TBCR: 20.0000 meq | EXTENDED_RELEASE_TABLET | Freq: Every day | ORAL | Status: DC

## 2011-03-12 MED ORDER — DUTASTERIDE 0.5 MG PO CAPS: 0.5000 mg | ORAL_CAPSULE | Freq: Every day | ORAL | Status: DC

## 2011-03-12 MED ORDER — GABAPENTIN 300 MG PO CAPS: 300.0000 mg | ORAL_CAPSULE | Freq: Every day | ORAL | Status: DC

## 2011-03-12 MED ORDER — TIOTROPIUM BROMIDE MONOHYDRATE 18 MCG IN CAPS: 18.0000 ug | ORAL_CAPSULE | Freq: Every day | RESPIRATORY_TRACT | Status: DC

## 2011-03-12 MED ORDER — COLCHICINE 0.6 MG PO TABS: ORAL_TABLET | ORAL | Status: DC

## 2011-03-12 MED ORDER — ATORVASTATIN CALCIUM 20 MG PO TABS: 20.0000 mg | ORAL_TABLET | Freq: Every day | ORAL | Status: DC

## 2011-03-12 MED ORDER — PANTOPRAZOLE SODIUM 40 MG PO TBEC: 40.0000 mg | DELAYED_RELEASE_TABLET | Freq: Every day | ORAL | Status: DC

## 2011-03-12 MED ORDER — TAMSULOSIN HCL 0.4 MG PO CAPS: 0.4000 mg | ORAL_CAPSULE | Freq: Every day | ORAL | Status: DC

## 2011-03-12 MED ORDER — FUROSEMIDE 40 MG PO TABS: 40.0000 mg | ORAL_TABLET | Freq: Every day | ORAL | Status: DC

## 2011-03-12 MED ORDER — LISINOPRIL 10 MG PO TABS: 10.0000 mg | ORAL_TABLET | Freq: Every day | ORAL | Status: DC

## 2011-03-12 MED ORDER — FERROUS GLUCONATE 325 MG PO TABS: 325.0000 mg | ORAL_TABLET | Freq: Two times a day (BID) | ORAL | Status: DC

## 2011-03-12 MED ORDER — GLIMEPIRIDE 4 MG PO TABS: 4.0000 mg | ORAL_TABLET | Freq: Every day | ORAL | Status: DC

## 2011-03-12 MED ORDER — EPLERENONE 25 MG PO TABS: ORAL_TABLET | ORAL | Status: DC

## 2011-03-12 MED ORDER — ALBUTEROL SULFATE HFA 108 (90 BASE) MCG/ACT IN AERS: 2.0000 | INHALATION_SPRAY | Freq: Four times a day (QID) | RESPIRATORY_TRACT | Status: DC | PRN

## 2011-03-12 NOTE — Telephone Encounter (Signed)
Verify coreg dose.. Two different doses listed in med list.  Okay to refill dose he is taking. Others verified and sent.

## 2011-03-12 NOTE — Telephone Encounter (Signed)
Pt received call from mail order pharmacy that all her meds need refills. I called pharmacy and it was verified that all auto refills on rx's are expired and pt does need refills.  FYI this is a [name] pt that is planing on switching to Dr Reece Agar, but has not established with him yet.

## 2011-03-16 ENCOUNTER — Other Ambulatory Visit: Payer: Self-pay | Admitting: Family Medicine

## 2011-03-16 MED ORDER — CARVEDILOL 6.25 MG PO TABS: 6.2500 mg | ORAL_TABLET | Freq: Two times a day (BID) | ORAL | Status: DC

## 2011-03-17 ENCOUNTER — Telehealth: Payer: Self-pay | Admitting: Internal Medicine

## 2011-03-17 MED ORDER — FERROUS GLUCONATE 325 MG PO TABS: 325.0000 mg | ORAL_TABLET | Freq: Two times a day (BID) | ORAL | Status: DC

## 2011-03-17 MED ORDER — PANTOPRAZOLE SODIUM 40 MG PO TBEC: 40.0000 mg | DELAYED_RELEASE_TABLET | Freq: Every day | ORAL | Status: DC

## 2011-03-17 MED ORDER — EPLERENONE 25 MG PO TABS: ORAL_TABLET | ORAL | Status: DC

## 2011-03-17 MED ORDER — POTASSIUM CHLORIDE CRYS ER 20 MEQ PO TBCR: 20.0000 meq | EXTENDED_RELEASE_TABLET | Freq: Every day | ORAL | Status: DC

## 2011-03-17 MED ORDER — GLIMEPIRIDE 4 MG PO TABS: 4.0000 mg | ORAL_TABLET | Freq: Every day | ORAL | Status: DC

## 2011-03-17 MED ORDER — DUTASTERIDE 0.5 MG PO CAPS: 0.5000 mg | ORAL_CAPSULE | Freq: Every day | ORAL | Status: DC

## 2011-03-17 MED ORDER — FUROSEMIDE 40 MG PO TABS: 40.0000 mg | ORAL_TABLET | Freq: Every day | ORAL | Status: DC

## 2011-03-17 MED ORDER — TAMSULOSIN HCL 0.4 MG PO CAPS: 0.4000 mg | ORAL_CAPSULE | Freq: Every day | ORAL | Status: DC

## 2011-03-17 MED ORDER — ATORVASTATIN CALCIUM 20 MG PO TABS: 20.0000 mg | ORAL_TABLET | Freq: Every day | ORAL | Status: DC

## 2011-03-17 MED ORDER — TIOTROPIUM BROMIDE MONOHYDRATE 18 MCG IN CAPS: 18.0000 ug | ORAL_CAPSULE | Freq: Every day | RESPIRATORY_TRACT | Status: DC

## 2011-03-17 MED ORDER — CARVEDILOL 6.25 MG PO TABS: 6.2500 mg | ORAL_TABLET | Freq: Two times a day (BID) | ORAL | Status: DC

## 2011-03-17 MED ORDER — LISINOPRIL 10 MG PO TABS: 10.0000 mg | ORAL_TABLET | Freq: Every day | ORAL | Status: DC

## 2011-03-17 MED ORDER — GABAPENTIN 300 MG PO CAPS: 300.0000 mg | ORAL_CAPSULE | Freq: Every day | ORAL | Status: DC

## 2011-03-17 NOTE — Telephone Encounter (Signed)
Forwarded to Heather/Dr. Ermalene Searing

## 2011-03-17 NOTE — Telephone Encounter (Signed)
Patient's daughter called and stated some of his Rx's(14) total were sent to [facility] [address] [facility] instead of [facility].  She is going to cancel the orders at [facility] [address] [facility].  She would like for you to reorder all of his medications for a 90 day supply to [facility].

## 2011-03-17 NOTE — Telephone Encounter (Signed)
Reordered and sent to mail order pharmacy 

## 2011-03-25 ENCOUNTER — Other Ambulatory Visit (INDEPENDENT_AMBULATORY_CARE_PROVIDER_SITE_OTHER): Payer: Medicare Other

## 2011-03-25 DIAGNOSIS — E119 Type 2 diabetes mellitus without complications: Secondary | ICD-10-CM

## 2011-03-25 DIAGNOSIS — E785 Hyperlipidemia, unspecified: Secondary | ICD-10-CM

## 2011-03-25 LAB — COMPREHENSIVE METABOLIC PANEL
ALT: 37 U/L (ref 0–53)
AST: 30 U/L (ref 0–37)
Albumin: 4 g/dL (ref 3.5–5.2)
Alkaline Phosphatase: 100 U/L (ref 39–117)
Potassium: 4.1 mEq/L (ref 3.5–5.1)
Sodium: 140 mEq/L (ref 135–145)
Total Protein: 6.5 g/dL (ref 6.0–8.3)

## 2011-03-25 LAB — LIPID PANEL
LDL Cholesterol: 31 mg/dL (ref 0–99)
Total CHOL/HDL Ratio: 3
VLDL: 22.4 mg/dL (ref 0.0–40.0)

## 2011-03-26 ENCOUNTER — Other Ambulatory Visit: Payer: Self-pay | Admitting: Internal Medicine

## 2011-03-26 DIAGNOSIS — E119 Type 2 diabetes mellitus without complications: Secondary | ICD-10-CM

## 2011-03-26 DIAGNOSIS — J449 Chronic obstructive pulmonary disease, unspecified: Secondary | ICD-10-CM

## 2011-03-26 DIAGNOSIS — E785 Hyperlipidemia, unspecified: Secondary | ICD-10-CM

## 2011-03-26 MED ORDER — COLCHICINE 0.6 MG PO TABS: ORAL_TABLET | ORAL | Status: DC

## 2011-03-26 MED ORDER — TIOTROPIUM BROMIDE MONOHYDRATE 18 MCG IN CAPS: 18.0000 ug | ORAL_CAPSULE | Freq: Every day | RESPIRATORY_TRACT | Status: DC

## 2011-03-26 MED ORDER — PANTOPRAZOLE SODIUM 40 MG PO TBEC: 40.0000 mg | DELAYED_RELEASE_TABLET | Freq: Every day | ORAL | Status: DC

## 2011-03-26 MED ORDER — GLUCOSE BLOOD VI STRP: ORAL_STRIP | Status: DC

## 2011-03-26 MED ORDER — DUTASTERIDE 0.5 MG PO CAPS: 0.5000 mg | ORAL_CAPSULE | Freq: Every day | ORAL | Status: DC

## 2011-03-26 NOTE — Telephone Encounter (Signed)
Rx sent to wrong pharmacy.  Cancelled and sent to [facility].

## 2011-04-13 ENCOUNTER — Encounter (HOSPITAL_COMMUNITY): Payer: Self-pay | Admitting: Pharmacy Technician

## 2011-04-21 ENCOUNTER — Ambulatory Visit (HOSPITAL_COMMUNITY)
Admission: RE | Admit: 2011-04-21 | Discharge: 2011-04-21 | Disposition: A | Discharge status: Home / Self Care | Payer: Medicare Other | Source: Ambulatory Visit | Attending: Neurological Surgery | Admitting: Neurological Surgery

## 2011-04-21 ENCOUNTER — Encounter (HOSPITAL_COMMUNITY): Payer: Self-pay

## 2011-04-21 ENCOUNTER — Encounter (HOSPITAL_COMMUNITY)
Admission: RE | Admit: 2011-04-21 | Discharge: 2011-04-21 | Disposition: A | Discharge status: Home / Self Care | Payer: Medicare Other | Source: Ambulatory Visit | Attending: Neurological Surgery | Admitting: Neurological Surgery

## 2011-04-21 DIAGNOSIS — Z01811 Encounter for preprocedural respiratory examination: Secondary | ICD-10-CM | POA: Diagnosis not present

## 2011-04-21 DIAGNOSIS — I10 Essential (primary) hypertension: Secondary | ICD-10-CM | POA: Diagnosis not present

## 2011-04-21 DIAGNOSIS — I517 Cardiomegaly: Secondary | ICD-10-CM | POA: Insufficient documentation

## 2011-04-21 DIAGNOSIS — Z01818 Encounter for other preprocedural examination: Secondary | ICD-10-CM | POA: Insufficient documentation

## 2011-04-21 DIAGNOSIS — F17200 Nicotine dependence, unspecified, uncomplicated: Secondary | ICD-10-CM | POA: Insufficient documentation

## 2011-04-21 DIAGNOSIS — Z95810 Presence of automatic (implantable) cardiac defibrillator: Secondary | ICD-10-CM | POA: Insufficient documentation

## 2011-04-21 DIAGNOSIS — E119 Type 2 diabetes mellitus without complications: Secondary | ICD-10-CM | POA: Diagnosis not present

## 2011-04-21 DIAGNOSIS — Z01812 Encounter for preprocedural laboratory examination: Secondary | ICD-10-CM | POA: Insufficient documentation

## 2011-04-21 HISTORY — DX: Unspecified osteoarthritis, unspecified site: M19.90

## 2011-04-21 HISTORY — DX: Malignant (primary) neoplasm, unspecified: C80.1

## 2011-04-21 HISTORY — DX: Gastro-esophageal reflux disease without esophagitis: K21.9

## 2011-04-21 HISTORY — DX: Acute infarction of spinal cord (embolic) (nonembolic): G95.11

## 2011-04-21 LAB — CBC
Hemoglobin: 14.4 g/dL (ref 13.0–17.0)
MCH: 31.2 pg (ref 26.0–34.0)
MCV: 92.2 fL (ref 78.0–100.0)
Platelets: 158 10*3/uL (ref 150–400)
RBC: 4.61 MIL/uL (ref 4.22–5.81)

## 2011-04-21 LAB — DIFFERENTIAL
Basophils Relative: 1 % (ref 0–1)
Eosinophils Relative: 2 % (ref 0–5)
Lymphs Abs: 5.3 10*3/uL — ABNORMAL HIGH (ref 0.7–4.0)
Monocytes Absolute: 1.2 10*3/uL — ABNORMAL HIGH (ref 0.1–1.0)
Neutro Abs: 6 10*3/uL (ref 1.7–7.7)

## 2011-04-21 LAB — COMPREHENSIVE METABOLIC PANEL
ALT: 53 U/L (ref 0–53)
Alkaline Phosphatase: 125 U/L — ABNORMAL HIGH (ref 39–117)
CO2: 29 mEq/L (ref 19–32)
Chloride: 104 mEq/L (ref 96–112)
GFR calc Af Amer: 72 mL/min — ABNORMAL LOW (ref >90)
GFR calc non Af Amer: 62 mL/min — ABNORMAL LOW (ref >90)
Glucose, Bld: 58 mg/dL — ABNORMAL LOW (ref 70–99)
Potassium: 4.4 mEq/L (ref 3.5–5.1)
Sodium: 142 mEq/L (ref 135–145)

## 2011-04-21 NOTE — Pre-Procedure Instructions (Signed)
20 KYLAN LIBERATI  04/21/2011   Your procedure is scheduled on:  *Wednesday, Jan 16**  Report to [facility] at *0630** AM.  Call this number if you have problems the morning of surgery: [phone]   Remember:   Do not eat food:After Midnight.  May have clear liquids: up to 4 Hours before arrival.  Clear liquids include soda, tea, black coffee, apple or grape juice, broth.  Take these medicines the morning of surgery with A SIP OF WATER: *Inhalers,Carvedilol,Protonix**   Do not wear jewelry, make-up or nail polish.  Do not wear lotions, powders, or perfumes. You may wear deodorant.  Do not shave 48 hours prior to surgery.  Do not bring valuables to the hospital.  Contacts, dentures or bridgework may not be worn into surgery.  Leave suitcase in the car. After surgery it may be brought to your room.  For patients admitted to the hospital, checkout time is 11:00 AM the day of discharge.   Patients discharged the day of surgery will not be allowed to drive home.  Name and phone number of your driver: *n/a**  Special Instructions: CHG Shower Use Special Wash: 1/2 bottle night before surgery and 1/2 bottle morning of surgery.   Please read over the following fact sheets that you were given: Pain Booklet, Coughing and Deep Breathing, MRSA Information and Surgical Site Infection Prevention

## 2011-04-22 NOTE — Consult Note (Signed)
Anesthesia:  Patient is a 78 year old male scheduled for decompressive laminectomoy L2-3, 3-4, 5-6 on 04/29/11.  His history includes CHF secondary to non-ischemic CM with a recovered EF, chronic afib s/p AV node ablation and BIV ICD implantation (not on Coumadin due to h/o GIB), vfib arrest with ICD shock in Jan '11 and Nov '09, COPD, left BBB, DM2, HTN, HLD, BPH,and smoking.  Pre-operative labs noted.  WBC elevated at 12.9, glucose 58, and Alk Phos and AST mildly elevated at 125 and 39 respectively.  PLT and coags are WNL.  CXR showed AICD in place. Stable cardiac silhouette enlargement. Overall hyperinflation consistent with COPD. Stable fibrotic changes in right upper lobe. No acute cardiopulmonary or pleural abnormality is evident.   E[facility] from 11/19/10 at [facility] showed a fib with v-paced rhythm.  His primary Cardiologist is Dr. Gala Romney.  Dr. Graciela Husbands is his EP Cardiologist.  After his last appointment with Dr. Gala Romney in August, he was actually d/c'd from the heart failure clinic.  His last cath in May of 2012 showed minimal nonobstructive CAD. EF was only 25 % at that time.  MR 3+.  However, his last echo from 05/14/09 showed: - Left ventricle: The cavity size was normal. Wall thickness was normal. Systolic function was normal. The estimated ejection fraction was in the range of 55% to 65%. Wall motion was normal; there were no regional wall motion abnormalities. Doppler parameters are consistent with abnormal left ventricular relaxation (grade 1 diastolic dysfunction). - Aortic valve: Trileaflet; normal thickness, mildly calcified leaflets. - Left atrium: The atrium was mild to moderately dilated. - Right ventricle: The cavity size was normal. Wall thickness was normal. Systolic function was normal. - Tricuspid valve: Mild regurgitation. - No MR.  A right heart cath was done on 11/05/06 and showed relatively normal filling pressures with preserved cardiac output.    If no new issues  from a CV standpoint then plan to proceed.  I have re-faxed the ICD peri-operative RX to [facility] to be completed by Dr. Graciela Husbands.  The chart will be placed in the Short Stay RN follow-up cabinet.

## 2011-04-23 NOTE — Progress Notes (Signed)
Spoke with Leta Jungling with [employer] at [phone] for device reprogramming before surgery. States she has placed pt on calender and will be here to take care of device.

## 2011-04-27 ENCOUNTER — Telehealth: Payer: Self-pay | Admitting: Internal Medicine

## 2011-04-27 NOTE — Telephone Encounter (Signed)
[facility] faxed a form stating that the Ferrous Gluconate 324mg   isn't covered.  Would like you to Rx an alternative.

## 2011-04-28 MED ORDER — FERROUS SULFATE 325 (65 FE) MG PO TABS: 325.0000 mg | ORAL_TABLET | Freq: Every day | ORAL | Status: DC

## 2011-04-28 MED ORDER — CEFAZOLIN SODIUM-DEXTROSE 2-3 GM-% IV SOLR
2.0000 g | INTRAVENOUS | Status: AC
  Administered 2011-04-29: 2 g via INTRAVENOUS
  Filled 2011-04-28: qty 50

## 2011-04-28 NOTE — Telephone Encounter (Signed)
Sent in ferrous sulfate °

## 2011-04-29 ENCOUNTER — Encounter (HOSPITAL_COMMUNITY): Payer: Self-pay | Admitting: Vascular Surgery

## 2011-04-29 ENCOUNTER — Encounter (HOSPITAL_COMMUNITY): Payer: Self-pay | Admitting: *Deleted

## 2011-04-29 ENCOUNTER — Ambulatory Visit (HOSPITAL_COMMUNITY): Payer: Medicare Other

## 2011-04-29 ENCOUNTER — Inpatient Hospital Stay (HOSPITAL_COMMUNITY)
Admission: RE | Admit: 2011-04-29 | Discharge: 2011-05-04 | DRG: 491 | Disposition: A | Discharge status: Home / Self Care | Payer: Medicare Other | Source: Ambulatory Visit | Attending: Neurological Surgery | Admitting: Neurological Surgery

## 2011-04-29 ENCOUNTER — Ambulatory Visit (HOSPITAL_COMMUNITY): Payer: Medicare Other | Admitting: Vascular Surgery

## 2011-04-29 ENCOUNTER — Encounter (HOSPITAL_COMMUNITY)
Admission: RE | Disposition: A | Discharge status: Home / Self Care | Payer: Self-pay | Source: Ambulatory Visit | Attending: Neurological Surgery

## 2011-04-29 DIAGNOSIS — I4891 Unspecified atrial fibrillation: Secondary | ICD-10-CM | POA: Diagnosis present

## 2011-04-29 DIAGNOSIS — J449 Chronic obstructive pulmonary disease, unspecified: Secondary | ICD-10-CM | POA: Diagnosis not present

## 2011-04-29 DIAGNOSIS — E119 Type 2 diabetes mellitus without complications: Secondary | ICD-10-CM | POA: Diagnosis present

## 2011-04-29 DIAGNOSIS — Z01812 Encounter for preprocedural laboratory examination: Secondary | ICD-10-CM

## 2011-04-29 DIAGNOSIS — E785 Hyperlipidemia, unspecified: Secondary | ICD-10-CM | POA: Diagnosis present

## 2011-04-29 DIAGNOSIS — R339 Retention of urine, unspecified: Secondary | ICD-10-CM | POA: Diagnosis not present

## 2011-04-29 DIAGNOSIS — M5137 Other intervertebral disc degeneration, lumbosacral region: Secondary | ICD-10-CM | POA: Diagnosis not present

## 2011-04-29 DIAGNOSIS — J4489 Other specified chronic obstructive pulmonary disease: Secondary | ICD-10-CM | POA: Diagnosis not present

## 2011-04-29 DIAGNOSIS — I2510 Atherosclerotic heart disease of native coronary artery without angina pectoris: Secondary | ICD-10-CM | POA: Diagnosis not present

## 2011-04-29 DIAGNOSIS — I10 Essential (primary) hypertension: Secondary | ICD-10-CM | POA: Diagnosis present

## 2011-04-29 DIAGNOSIS — Z95810 Presence of automatic (implantable) cardiac defibrillator: Secondary | ICD-10-CM | POA: Diagnosis not present

## 2011-04-29 DIAGNOSIS — Z9861 Coronary angioplasty status: Secondary | ICD-10-CM | POA: Diagnosis not present

## 2011-04-29 DIAGNOSIS — E669 Obesity, unspecified: Secondary | ICD-10-CM | POA: Diagnosis present

## 2011-04-29 DIAGNOSIS — Z860100 Personal history of colon polyps, unspecified: Secondary | ICD-10-CM

## 2011-04-29 DIAGNOSIS — D649 Anemia, unspecified: Secondary | ICD-10-CM | POA: Diagnosis not present

## 2011-04-29 DIAGNOSIS — M48061 Spinal stenosis, lumbar region without neurogenic claudication: Principal | ICD-10-CM | POA: Diagnosis present

## 2011-04-29 DIAGNOSIS — M519 Unspecified thoracic, thoracolumbar and lumbosacral intervertebral disc disorder: Secondary | ICD-10-CM | POA: Diagnosis not present

## 2011-04-29 DIAGNOSIS — K219 Gastro-esophageal reflux disease without esophagitis: Secondary | ICD-10-CM | POA: Diagnosis present

## 2011-04-29 DIAGNOSIS — M5126 Other intervertebral disc displacement, lumbar region: Secondary | ICD-10-CM | POA: Diagnosis not present

## 2011-04-29 DIAGNOSIS — I509 Heart failure, unspecified: Secondary | ICD-10-CM | POA: Diagnosis present

## 2011-04-29 DIAGNOSIS — Z9889 Other specified postprocedural states: Secondary | ICD-10-CM

## 2011-04-29 DIAGNOSIS — M545 Low back pain, unspecified: Secondary | ICD-10-CM | POA: Diagnosis not present

## 2011-04-29 DIAGNOSIS — M79609 Pain in unspecified limb: Secondary | ICD-10-CM | POA: Diagnosis not present

## 2011-04-29 HISTORY — PX: LUMBAR LAMINECTOMY/DECOMPRESSION MICRODISCECTOMY: SHX5026

## 2011-04-29 LAB — GLUCOSE, CAPILLARY
Glucose-Capillary: 100 mg/dL — ABNORMAL HIGH (ref 70–99)
Glucose-Capillary: 86 mg/dL (ref 70–99)
Glucose-Capillary: 70 mg/dL (ref 70–99)

## 2011-04-29 SURGERY — LUMBAR LAMINECTOMY/DECOMPRESSION MICRODISCECTOMY
Anesthesia: General | Site: Back | Wound class: Clean

## 2011-04-29 MED ORDER — LISINOPRIL 10 MG PO TABS
10.0000 mg | ORAL_TABLET | Freq: Every day | ORAL | Status: DC
  Administered 2011-04-30 – 2011-05-01 (×2): 10 mg via ORAL
  Filled 2011-04-29 (×4): qty 1

## 2011-04-29 MED ORDER — FUROSEMIDE 40 MG PO TABS
40.0000 mg | ORAL_TABLET | Freq: Every day | ORAL | Status: DC
  Administered 2011-04-29 – 2011-05-01 (×3): 40 mg via ORAL
  Filled 2011-04-29 (×4): qty 1

## 2011-04-29 MED ORDER — ACETAMINOPHEN 650 MG RE SUPP: 650.0000 mg | RECTAL | Status: DC | PRN

## 2011-04-29 MED ORDER — DUTASTERIDE 0.5 MG PO CAPS
0.5000 mg | ORAL_CAPSULE | Freq: Every day | ORAL | Status: DC
  Administered 2011-04-29 – 2011-05-04 (×6): 0.5 mg via ORAL
  Filled 2011-04-29 (×7): qty 1

## 2011-04-29 MED ORDER — CARVEDILOL 6.25 MG PO TABS
6.2500 mg | ORAL_TABLET | Freq: Two times a day (BID) | ORAL | Status: DC
  Administered 2011-04-30 – 2011-05-04 (×6): 6.25 mg via ORAL
  Filled 2011-04-29 (×12): qty 1

## 2011-04-29 MED ORDER — DEXAMETHASONE SODIUM PHOSPHATE 10 MG/ML IJ SOLN
10.0000 mg | Freq: Once | INTRAMUSCULAR | Status: DC
  Filled 2011-04-29 (×2): qty 1

## 2011-04-29 MED ORDER — ALBUTEROL SULFATE HFA 108 (90 BASE) MCG/ACT IN AERS
2.0000 | INHALATION_SPRAY | Freq: Four times a day (QID) | RESPIRATORY_TRACT | Status: DC | PRN
  Filled 2011-04-29: qty 6.7

## 2011-04-29 MED ORDER — CEFAZOLIN SODIUM 1-5 GM-% IV SOLN
1.0000 g | Freq: Three times a day (TID) | INTRAVENOUS | Status: AC
  Administered 2011-04-29 – 2011-04-30 (×2): 1 g via INTRAVENOUS
  Filled 2011-04-29 (×2): qty 50

## 2011-04-29 MED ORDER — ROCURONIUM BROMIDE 100 MG/10ML IV SOLN
INTRAVENOUS | Status: DC | PRN
  Administered 2011-04-29: 50 mg via INTRAVENOUS

## 2011-04-29 MED ORDER — ONDANSETRON HCL 4 MG/2ML IJ SOLN
INTRAMUSCULAR | Status: DC | PRN
  Administered 2011-04-29: 4 mg via INTRAVENOUS

## 2011-04-29 MED ORDER — COLCHICINE 0.6 MG PO TABS
0.6000 mg | ORAL_TABLET | Freq: Every day | ORAL | Status: DC
  Administered 2011-04-29 – 2011-05-04 (×6): 0.6 mg via ORAL
  Filled 2011-04-29 (×6): qty 1

## 2011-04-29 MED ORDER — FENTANYL CITRATE 0.05 MG/ML IJ SOLN
INTRAMUSCULAR | Status: DC | PRN
  Administered 2011-04-29: 150 ug via INTRAVENOUS

## 2011-04-29 MED ORDER — GLYCOPYRROLATE 0.2 MG/ML IJ SOLN
INTRAMUSCULAR | Status: DC | PRN
  Administered 2011-04-29: .4 mg via INTRAVENOUS

## 2011-04-29 MED ORDER — FERROUS SULFATE 325 (65 FE) MG PO TABS
325.0000 mg | ORAL_TABLET | Freq: Every day | ORAL | Status: DC
  Administered 2011-04-30 – 2011-05-04 (×5): 325 mg via ORAL
  Filled 2011-04-29 (×6): qty 1

## 2011-04-29 MED ORDER — ONDANSETRON HCL 4 MG/2ML IJ SOLN: 4.0000 mg | INTRAMUSCULAR | Status: DC | PRN

## 2011-04-29 MED ORDER — MENTHOL 3 MG MT LOZG: 1.0000 | LOZENGE | OROMUCOSAL | Status: DC | PRN

## 2011-04-29 MED ORDER — ASPIRIN BUF(CACARB-MGCARB-MGO) 325 MG PO TABS: 325.0000 mg | ORAL_TABLET | Freq: Every day | ORAL | Status: DC

## 2011-04-29 MED ORDER — SODIUM CHLORIDE 0.9 % IR SOLN
Status: DC | PRN
  Administered 2011-04-29: 13:00:00

## 2011-04-29 MED ORDER — GLIMEPIRIDE 4 MG PO TABS
4.0000 mg | ORAL_TABLET | Freq: Every day | ORAL | Status: DC
  Administered 2011-04-29 – 2011-05-01 (×3): 4 mg via ORAL
  Filled 2011-04-29 (×4): qty 1

## 2011-04-29 MED ORDER — ASPIRIN EC 325 MG PO TBEC
325.0000 mg | DELAYED_RELEASE_TABLET | Freq: Every day | ORAL | Status: DC
  Administered 2011-04-29 – 2011-05-04 (×6): 325 mg via ORAL
  Filled 2011-04-29 (×7): qty 1

## 2011-04-29 MED ORDER — HYDROCODONE-ACETAMINOPHEN 5-325 MG PO TABS
1.0000 | ORAL_TABLET | ORAL | Status: DC | PRN
  Administered 2011-04-30 – 2011-05-02 (×8): 2 via ORAL
  Administered 2011-05-02: 1 via ORAL
  Filled 2011-04-29 (×9): qty 2

## 2011-04-29 MED ORDER — SODIUM CHLORIDE 0.9 % IV SOLN
10.0000 mg | INTRAVENOUS | Status: DC | PRN
  Administered 2011-04-29: 5 ug/min via INTRAVENOUS

## 2011-04-29 MED ORDER — SODIUM CHLORIDE 0.9 % IJ SOLN
3.0000 mL | Freq: Two times a day (BID) | INTRAMUSCULAR | Status: DC
  Administered 2011-04-29 – 2011-05-01 (×4): 3 mL via INTRAVENOUS

## 2011-04-29 MED ORDER — THROMBIN 20000 UNITS EX KIT
PACK | CUTANEOUS | Status: DC | PRN
  Administered 2011-04-29: 13:00:00 via TOPICAL

## 2011-04-29 MED ORDER — THROMBIN 5000 UNITS EX SOLR
OROMUCOSAL | Status: DC | PRN
  Administered 2011-04-29: 13:00:00 via TOPICAL

## 2011-04-29 MED ORDER — POTASSIUM CHLORIDE IN NACL 20-0.9 MEQ/L-% IV SOLN
INTRAVENOUS | Status: DC
  Administered 2011-04-29 – 2011-05-02 (×2): via INTRAVENOUS
  Filled 2011-04-29 (×7): qty 1000

## 2011-04-29 MED ORDER — ACETAMINOPHEN 325 MG PO TABS: 650.0000 mg | ORAL_TABLET | ORAL | Status: DC | PRN

## 2011-04-29 MED ORDER — POTASSIUM CHLORIDE CRYS ER 20 MEQ PO TBCR
20.0000 meq | EXTENDED_RELEASE_TABLET | Freq: Every day | ORAL | Status: DC
  Administered 2011-04-29 – 2011-05-04 (×6): 20 meq via ORAL
  Filled 2011-04-29 (×6): qty 1

## 2011-04-29 MED ORDER — SODIUM CHLORIDE 0.9 % IV SOLN
INTRAVENOUS | Status: AC
  Filled 2011-04-29: qty 500

## 2011-04-29 MED ORDER — SODIUM CHLORIDE 0.9 % IR SOLN
Status: DC | PRN
  Administered 2011-04-29: 1000 mL

## 2011-04-29 MED ORDER — SODIUM CHLORIDE 0.9 % IJ SOLN
3.0000 mL | INTRAMUSCULAR | Status: DC | PRN
  Administered 2011-04-30: 3 mL via INTRAVENOUS

## 2011-04-29 MED ORDER — HYDROMORPHONE HCL PF 1 MG/ML IJ SOLN
INTRAMUSCULAR | Status: AC
  Filled 2011-04-29: qty 1

## 2011-04-29 MED ORDER — NEOSTIGMINE METHYLSULFATE 1 MG/ML IJ SOLN
INTRAMUSCULAR | Status: DC | PRN
  Administered 2011-04-29: 3 mg via INTRAVENOUS

## 2011-04-29 MED ORDER — BUPIVACAINE HCL (PF) 0.25 % IJ SOLN
INTRAMUSCULAR | Status: DC | PRN
  Administered 2011-04-29: 16 mL

## 2011-04-29 MED ORDER — BACITRACIN 50000 UNITS IM SOLR
INTRAMUSCULAR | Status: AC
  Filled 2011-04-29: qty 1

## 2011-04-29 MED ORDER — TAMSULOSIN HCL 0.4 MG PO CAPS
0.4000 mg | ORAL_CAPSULE | Freq: Every day | ORAL | Status: DC
  Administered 2011-04-29 – 2011-05-04 (×6): 0.4 mg via ORAL
  Filled 2011-04-29 (×6): qty 1

## 2011-04-29 MED ORDER — MORPHINE SULFATE 4 MG/ML IJ SOLN
1.0000 mg | INTRAMUSCULAR | Status: DC | PRN
  Administered 2011-04-29: 2 mg via INTRAVENOUS
  Administered 2011-04-30 – 2011-05-01 (×2): 4 mg via INTRAVENOUS
  Filled 2011-04-29 (×3): qty 1

## 2011-04-29 MED ORDER — SENNA 8.6 MG PO TABS
1.0000 | ORAL_TABLET | Freq: Two times a day (BID) | ORAL | Status: DC
  Administered 2011-04-30 – 2011-05-04 (×9): 8.6 mg via ORAL
  Filled 2011-04-29 (×11): qty 1

## 2011-04-29 MED ORDER — LACTATED RINGERS IV SOLN
INTRAVENOUS | Status: DC | PRN
  Administered 2011-04-29 (×2): via INTRAVENOUS

## 2011-04-29 MED ORDER — PROPOFOL 10 MG/ML IV EMUL
INTRAVENOUS | Status: DC | PRN
  Administered 2011-04-29: 130 mg via INTRAVENOUS

## 2011-04-29 MED ORDER — SODIUM CHLORIDE 0.9 % IV SOLN: 250.0000 mL | INTRAVENOUS | Status: DC

## 2011-04-29 MED ORDER — PHENOL 1.4 % MT LIQD: 1.0000 | OROMUCOSAL | Status: DC | PRN

## 2011-04-29 MED ORDER — DROPERIDOL 2.5 MG/ML IJ SOLN: 0.6250 mg | INTRAMUSCULAR | Status: DC | PRN

## 2011-04-29 MED ORDER — HYDROMORPHONE HCL PF 1 MG/ML IJ SOLN
0.2500 mg | INTRAMUSCULAR | Status: DC | PRN
  Administered 2011-04-29 (×2): 0.5 mg via INTRAVENOUS

## 2011-04-29 MED ORDER — GABAPENTIN 300 MG PO CAPS
300.0000 mg | ORAL_CAPSULE | Freq: Every day | ORAL | Status: DC
  Administered 2011-04-29 – 2011-05-03 (×6): 300 mg via ORAL
  Filled 2011-04-29 (×7): qty 1

## 2011-04-29 MED ORDER — PANTOPRAZOLE SODIUM 40 MG PO TBEC
40.0000 mg | DELAYED_RELEASE_TABLET | Freq: Every day | ORAL | Status: DC
  Administered 2011-04-29 – 2011-05-04 (×6): 40 mg via ORAL
  Filled 2011-04-29 (×5): qty 1

## 2011-04-29 MED ORDER — TIOTROPIUM BROMIDE MONOHYDRATE 18 MCG IN CAPS
18.0000 ug | ORAL_CAPSULE | Freq: Every day | RESPIRATORY_TRACT | Status: DC
  Administered 2011-04-30 – 2011-05-04 (×5): 18 ug via RESPIRATORY_TRACT
  Filled 2011-04-29 (×2): qty 5

## 2011-04-29 MED ORDER — SPIRONOLACTONE 25 MG PO TABS
25.0000 mg | ORAL_TABLET | Freq: Every day | ORAL | Status: DC
  Administered 2011-04-29 – 2011-05-04 (×6): 25 mg via ORAL
  Filled 2011-04-29 (×6): qty 1

## 2011-04-29 SURGICAL SUPPLY — 48 items
APL SKNCLS STERI-STRIP NONHPOA (GAUZE/BANDAGES/DRESSINGS) ×1
BAG DECANTER FOR FLEXI CONT (MISCELLANEOUS) ×2 IMPLANT
BENZOIN TINCTURE PRP APPL 2/3 (GAUZE/BANDAGES/DRESSINGS) ×2 IMPLANT
BUR MATCHSTICK NEURO 3.0 LAGG (BURR) ×2 IMPLANT
CANISTER SUCTION 2500CC (MISCELLANEOUS) ×2 IMPLANT
CLOSURE STERI STRIP 1/2 X4 (GAUZE/BANDAGES/DRESSINGS) ×1 IMPLANT
CLOTH BEACON ORANGE TIMEOUT ST (SAFETY) ×2 IMPLANT
CONT SPEC 4OZ CLIKSEAL STRL BL (MISCELLANEOUS) ×2 IMPLANT
DRAPE LAPAROTOMY 100X72X124 (DRAPES) ×2 IMPLANT
DRAPE MICROSCOPE ZEISS OPMI (DRAPES) ×1 IMPLANT
DRAPE POUCH INSTRU U-SHP 10X18 (DRAPES) ×2 IMPLANT
DRAPE SURG 17X23 STRL (DRAPES) ×2 IMPLANT
DRESSING TELFA 8X3 (GAUZE/BANDAGES/DRESSINGS) ×2 IMPLANT
DRSG OPSITE 4X5.5 SM (GAUZE/BANDAGES/DRESSINGS) ×2 IMPLANT
DURAPREP 26ML APPLICATOR (WOUND CARE) ×2 IMPLANT
ELECT REM PT RETURN 9FT ADLT (ELECTROSURGICAL) ×2
ELECTRODE REM PT RTRN 9FT ADLT (ELECTROSURGICAL) ×1 IMPLANT
EVACUATOR 1/8 PVC DRAIN (DRAIN) ×1 IMPLANT
GAUZE SPONGE 4X4 16PLY XRAY LF (GAUZE/BANDAGES/DRESSINGS) IMPLANT
GLOVE BIO SURGEON STRL SZ8 (GLOVE) ×2 IMPLANT
GLOVE BIOGEL PI IND STRL 7.0 (GLOVE) IMPLANT
GLOVE BIOGEL PI INDICATOR 7.0 (GLOVE) ×1
GLOVE SURG SS PI 6.5 STRL IVOR (GLOVE) ×1 IMPLANT
GOWN BRE IMP SLV AUR LG STRL (GOWN DISPOSABLE) ×1 IMPLANT
GOWN BRE IMP SLV AUR XL STRL (GOWN DISPOSABLE) ×3 IMPLANT
GOWN STRL REIN 2XL LVL4 (GOWN DISPOSABLE) IMPLANT
HEMOSTAT POWDER KIT SURGIFOAM (HEMOSTASIS) IMPLANT
KIT BASIN OR (CUSTOM PROCEDURE TRAY) ×2 IMPLANT
KIT ROOM TURNOVER OR (KITS) ×2 IMPLANT
NDL HYPO 25X1 1.5 SAFETY (NEEDLE) ×1 IMPLANT
NDL SPNL 20GX3.5 QUINCKE YW (NEEDLE) IMPLANT
NEEDLE HYPO 25X1 1.5 SAFETY (NEEDLE) ×2 IMPLANT
NEEDLE SPNL 20GX3.5 QUINCKE YW (NEEDLE) ×2 IMPLANT
NS IRRIG 1000ML POUR BTL (IV SOLUTION) ×2 IMPLANT
PACK LAMINECTOMY NEURO (CUSTOM PROCEDURE TRAY) ×2 IMPLANT
PAD ARMBOARD 7.5X6 YLW CONV (MISCELLANEOUS) ×6 IMPLANT
RUBBERBAND STERILE (MISCELLANEOUS) ×4 IMPLANT
SPONGE SURGIFOAM ABS GEL SZ50 (HEMOSTASIS) ×2 IMPLANT
STRIP CLOSURE SKIN 1/2X4 (GAUZE/BANDAGES/DRESSINGS) ×2 IMPLANT
SUT VIC AB 0 CT1 18XCR BRD8 (SUTURE) ×1 IMPLANT
SUT VIC AB 0 CT1 8-18 (SUTURE) ×2
SUT VIC AB 2-0 CP2 18 (SUTURE) ×2 IMPLANT
SUT VIC AB 3-0 SH 8-18 (SUTURE) ×2 IMPLANT
SYR 20ML ECCENTRIC (SYRINGE) ×2 IMPLANT
TOWEL OR 17X24 6PK STRL BLUE (TOWEL DISPOSABLE) ×2 IMPLANT
TOWEL OR 17X26 10 PK STRL BLUE (TOWEL DISPOSABLE) ×2 IMPLANT
TRAY FOLEY CATH 14FRSI W/METER (CATHETERS) ×1 IMPLANT
WATER STERILE IRR 1000ML POUR (IV SOLUTION) ×2 IMPLANT

## 2011-04-29 NOTE — Anesthesia Preprocedure Evaluation (Addendum)
Anesthesia Evaluation  Patient identified by MRN, date of birth, ID band Patient awake    Reviewed: Allergy & Precautions, H&P , NPO status , Patient's Chart, lab work & pertinent test results, reviewed documented beta blocker date and time   History of Anesthesia Complications Negative for: history of anesthetic complications  Airway Mallampati: II TM Distance: >3 FB Neck ROM: Full    Dental  (+) Edentulous Upper, Edentulous Lower and Dental Advisory Given   Pulmonary shortness of breath and with exertion, pneumonia , COPD COPD inhaler,  clear to auscultation  Pulmonary exam normal       Cardiovascular hypertension, Pt. on medications + CAD and +CHF + dysrhythmias + pacemaker + Cardiac Defibrillator Regular - Systolic murmurs AICD no recent activity. Pacemaker dependent. Aicd function turned off and pacer changed to DOO for procedure.    Neuro/Psych  Neuromuscular disease Negative Psych ROS   GI/Hepatic Neg liver ROS, GERD-  ,  Endo/Other  Diabetes mellitus-, Oral Hypoglycemic Agents  Renal/GU negative Renal ROS     Musculoskeletal   Abdominal   Peds  Hematology   Anesthesia Other Findings   Reproductive/Obstetrics                           Anesthesia Physical Anesthesia Plan  ASA: III  Anesthesia Plan: General   Post-op Pain Management:    Induction: Intravenous  Airway Management Planned: Oral ETT  Additional Equipment: Arterial line  Intra-op Plan:   Post-operative Plan: Possible Post-op intubation/ventilation  Informed Consent: I have reviewed the patients History and Physical, chart, labs and discussed the procedure including the risks, benefits and alternatives for the proposed anesthesia with the patient or authorized representative who has indicated his/her understanding and acceptance.   Dental advisory given  Plan Discussed with: CRNA, Anesthesiologist and  Surgeon  Anesthesia Plan Comments:         Anesthesia Quick Evaluation

## 2011-04-29 NOTE — Op Note (Signed)
04/29/2011  2:45 PM  PATIENT:  [name]  78 y.o. male  PRE-OPERATIVE DIAGNOSIS:  Severe lumbar spinal stenosis L2-3 L3-4 L4-5 with back and leg pain  POST-OPERATIVE DIAGNOSIS:  Same  PROCEDURE:  Decompressive lumbar laminectomy, medial facetectomy and foraminotomies at L2-3 L3-4 L4-5 for decompression of the L2, L3, L4 and L5 nerve roots bilaterally  SURGEON:  Marikay Alar, MD  ASSISTANTS: Dr. Newell Coral  ANESTHESIA:   General  EBL: 50 ml  Total I/O In: 1000 [I.V.:1000] Out: 625 [Urine:550; Blood:75]  BLOOD ADMINISTERED:none  DRAINS: jp   SPECIMEN:  No Specimen  INDICATION FOR PROCEDURE: This is a man who had long history of severe spinal stenosis with back and leg pain. It CT myelogram showed severe spinal stenosis L2-3 L3-4 L4-5. I recommended a decompressive laminectomy.  Patient understood the risks, benefits, and alternatives and potential outcomes and wished to proceed.  PROCEDURE DETAILS: The patient was taken to the operating room and after induction of adequate generalized endotracheal anesthesia, the patient was rolled into the prone position on chest rolls and all pressure points were padded. The lumbar region was cleaned and then prepped with DuraPrep and draped in the usual sterile fashion. 5 cc of local anesthesia was injected and then a dorsal midline incision was made and carried down to the lumbo sacral fascia. The fascia was opened and the paraspinous musculature was taken down in a subperiosteal fashion to expose L2-3 L3-4 and L4-5 bilaterally. Intraoperative x-ray confirmed my levels, and then I used a combination of the high-speed drill and the Kerrison punches to perform a complete laminectomy, medial facetectomy, and foraminotomy at all 3 levels bilaterally. The underlying yellow ligament was opened and removed in a piecemeal fashion to expose the underlying dura and exiting nerve roots at each level. I undercut the lateral recess and dissected down until  I was medial to and distal to the pedicle at each level. The ligament was significantly overgrown, the facets were hypertrophic, and the lateral recesses were significantly stenotic. I spent considerable time undercutting the lateral recesses on the patient's right side and marched distally along each nerve root. Large osteophytes were removed over the L4 and L3 nerve roots on the right from within the foramen.  I then palpated with a coronary dilator along the nerve roots and into the foramen to assure adequate decompression. I felt no more compression of the nerve roots. I irrigated with saline solution containing bacitracin. Achieved hemostasis with bipolar cautery and with Surgifoam, lined the dura with Gelfoam, and then closed the fascia with 0 Vicryl. I closed the subcutaneous tissues with 2-0 Vicryl and the subcuticular tissues with 3-0 Vicryl. The skin was then closed with benzoin and Steri-Strips. The drapes were removed, a sterile dressing was applied. The patient was awakened from general anesthesia and transferred to the recovery room in stable condition. At the end of the procedure all sponge, needle and instrument counts were correct.  PLAN OF CARE: Admit to inpatient   PATIENT DISPOSITION:  PACU - hemodynamically stable.   Delay start of Pharmacological VTE agent (>24hrs) due to surgical blood loss or risk of bleeding:  yes

## 2011-04-29 NOTE — Anesthesia Postprocedure Evaluation (Signed)
Anesthesia Post Note  Patient: [name]  Procedure(s) Performed:  LUMBAR LAMINECTOMY/DECOMPRESSION MICRODISCECTOMY - Lumbar Two, Three, Four, Five Decompressive Lumbar Laminectomies  Anesthesia type: general  Patient location: PACU  Post pain: Pain level controlled  Post assessment: Patient's Cardiovascular Status Stable  Last Vitals:  Filed Vitals:   04/29/11 1445  BP:   Pulse:   Temp: 36.3 C  Resp:     Post vital signs: Reviewed and stable  Level of consciousness: sedated  Complications: No apparent anesthesia complications

## 2011-04-29 NOTE — H&P (Signed)
Subjective: Patient is a 78 y.o. male admitted for DLL L2-3, L3-4. L4-5. Onset of symptoms was several years ago, gradually worsening since that time.  The pain is rated mild, moderate, and is located at the across the lower back and radiates to R leg. The pain is described as burning, pinching and throbbing and occurs intermittently. The symptoms have been progressive. Symptoms are exacerbated by walking. MRI or CT showed spinal stenosis.   Past Medical History  Diagnosis Date  . COPD (chronic obstructive pulmonary disease)     GOLD II; Spirometry 07/10/2008 >FEV1 1.46 56% predicted, ratio of 66%  . Anemia     iron deficiency anemia with previous severe GI bleed   . Hypertension   . Hyperlipidemia   . Left bundle branch block   . Prostatic hypertrophy 09-11-97    Benign  . Obesity   . CHF (congestive heart failure)     secondary to nonischemic cardiomyopathy; ECHO 10/07 EF 20-25%, mod to severe MR; ECHO 1/10 EF 55-60%, mild MR; ECHO 2/11 55-65%, S/P Medtronic BiV ICD with biventricular function now turned off due to diahragmatic simulation  . CAD (coronary artery disease)     Mild, nonobstructive  . Atrial fibrillation     s/p AV node ablation; not on coumadin due to GIB  . Cardiac arrest - ventricular fibrillation 02/2008    Aborted, shocked by ICD  . Dyspnea     exertional  . Pneumonia     01/2011  . Diabetes mellitus     type 2 NIDDM x 5-6 yrs  . GERD (gastroesophageal reflux disease)   . Cancer     prostate  . Arthritis     back and knees  . 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 Inguinal hernia     left; asymptomatic    Past Surgical History  Procedure Date  . Lobectomy 01-21-98    lung  . Rotator cuff repair 1994 and 1995  . Knee surgery 1996    Left  . Cardiac catheterization 01/2006    Showed mild nonobstructive CAD  . Cardiac catheterization 11/05/2006    Right atrial pressure mean of 12, RV pressure 36/8, PA pressure 39/16 witha mean of 28,  wedge pressure was 20. Fick cardiac output was 5 liters per minute, cardiac index was 2.4  . Esophagogastroduodenoscopy 09/1997    Prepylor ulcer, esoph ring, duod avm  . Colonoscopy w/ polypectomy 11/1997    Divertics, int hemms  . Mch gi bleed 02/07-02/12/2002  . Esophagogastroduodenoscopy 05/22/2002    Poss Barrett's   . Colonoscopy 06/26/2002    Multip (neg) divertics, int hemm  . Colonoscopy 07/10/2004    Poyps, divertics, int hemms  . Mch sob 10/11-10/18/2007    A fib, CHF  . Esophagogastroduodenoscopy 02/19/2006    HH No active bleeding  . Ct head limited w/o cm 10/03/2006    No acute abnmlty  . Coronary angioplasty     Min abstrut zd severe LB dystn EF 25%  . Mch x 11/08-11/03/2006    Acute blood loss, anemia, sys HF, isch cardiomyopathy  . Hosp 8/14-8/23/2008    Acute on chronis CHF IIIB NOnisch Cardiomyop EF 20-25% Mod-Sev MR  . Colonoscopy 06/19/2008    2 polyps divertics int hemms (Dr Marina Goodell)  . Insert / replace / remove pacemaker 2007  . Eye surgery     cataract, bilateral  . Anterior cervical discectomy 02/2010  . Knee arthroscopy 1988  Prior to Admission medications   Medication Sig Start Date End Date Taking? Authorizing Provider  acetaminophen (TYLENOL) 500 MG tablet Take 500 mg by mouth every 6 (six) hours as needed.     Yes Historical Provider, MD  albuterol (PROAIR HFA) 108 (90 BASE) MCG/ACT inhaler Inhale 2 puffs into the lungs every 6 (six) hours as needed for wheezing. 03/12/11 03/11/12 Yes Amy Bedsole, MD  aspirin buffered (ADPRIN B) 325 MG TABS tablet Take 325 mg by mouth daily.     Yes Historical Provider, MD  atorvastatin (LIPITOR) 20 MG tablet Take 1 tablet (20 mg total) by mouth daily. 03/17/11  Yes Amy Ermalene Searing, MD  carvedilol (COREG) 6.25 MG tablet Take 1 tablet (6.25 mg total) by mouth 2 (two) times daily with a meal. 03/17/11 03/16/12 Yes Amy Bedsole, MD  Cholecalciferol (TH VITAMIN D3) 2000 UNITS CAPS Take by mouth daily.     Yes Historical  Provider, MD  colchicine 0.6 MG tablet 1 tab by mouth two times a day as needed gout pain 03/26/11  Yes Amy Bedsole, MD  Cyanocobalamin (VITAMIN B-12) 2500 MCG SUBL Place under the tongue daily.     Yes Historical Provider, MD  dutasteride (AVODART) 0.5 MG capsule Take 1 capsule (0.5 mg total) by mouth daily. 03/26/11  Yes Amy Ermalene Searing, MD  eplerenone (INSPRA) 25 MG tablet Take 1/2 by mouth daily 03/17/11  Yes Amy Bedsole, MD  ferrous sulfate 325 (65 FE) MG tablet Take 1 tablet (325 mg total) by mouth daily with breakfast. 04/28/11 04/27/12 Yes Amy Ermalene Searing, MD  furosemide (LASIX) 40 MG tablet Take 1 tablet (40 mg total) by mouth daily. Take one tablet by mouth daily 03/17/11  Yes Amy Bedsole, MD  gabapentin (NEURONTIN) 300 MG capsule Take 1 capsule (300 mg total) by mouth at bedtime. 03/17/11  Yes Amy Bedsole, MD  glimepiride (AMARYL) 4 MG tablet Take 1 tablet (4 mg total) by mouth daily. 03/17/11  Yes Amy Bedsole, MD  glucose blood (ACCU-CHEK AVIVA) test strip Use 1 daily and as needed for diabetes 03/26/11  Yes Amy Bedsole, MD  guaiFENesin (MUCINEX) 600 MG 12 hr tablet Take 1,200 mg by mouth 2 (two) times daily.     Yes Historical Provider, MD  lisinopril (PRINIVIL,ZESTRIL) 10 MG tablet Take 1 tablet (10 mg total) by mouth daily. 03/17/11  Yes Kerby Nora, MD  Misc. Devices [facility]) MISC by Does not apply route. Length of need= 99 years (perm) DX codes: 825.23, 824.0, 826.0, V45.02    Yes Historical Provider, MD  pantoprazole (PROTONIX) 40 MG tablet Take 1 tablet (40 mg total) by mouth daily. 03/26/11  Yes Amy Bedsole, MD  potassium chloride SA (K-DUR,KLOR-CON) 20 MEQ tablet Take 1 tablet (20 mEq total) by mouth daily. 03/17/11  Yes Amy Bedsole, MD  Tamsulosin HCl (FLOMAX) 0.4 MG CAPS Take 1 capsule (0.4 mg total) by mouth daily. 03/17/11  Yes Amy Bedsole, MD  tiotropium (SPIRIVA HANDIHALER) 18 MCG inhalation capsule Place 1 capsule (18 mcg total) into inhaler and inhale daily. 1 inhale every morning  03/26/11  Yes Kerby Nora, MD   No Known Allergies  History  Substance Use Topics  . Smoking status: Current Everyday Smoker -- 1.0 packs/day for 60 years    Types: Cigarettes  . Smokeless tobacco: Not on file   Comment: Smoker since 63, quit for 2 years and started back in 03/2008  . Alcohol Use: No    Family History  Problem Relation Age of Onset  . Heart failure  Mother     CHF  . Heart failure Father     CHF  . Hypertension Sister   . Obesity Sister   . Cancer Brother     lung cancer  . Lung cancer Brother   . Liver disease Brother     ETOH  . Alcohol abuse Brother   . Cancer Brother   . COPD Brother   . Cancer Brother   . Anxiety disorder Brother   . COPD Brother   . Hypertension Sister      Review of Systems  Positive ROS: neg  All other systems have been reviewed and were otherwise negative with the exception of those mentioned in the HPI and as above.  Objective: Vital signs in last 24 hours: Temp:  [97.5 F (36.4 C)] 97.5 F (36.4 C) (01/16 1020) Pulse Rate:  [83] 83  (01/16 1020) Resp:  [18] 18  (01/16 1020) BP: (125)/(76) 125/76 mmHg (01/16 1020) SpO2:  [96 %] 96 % (01/16 1020)  General Appearance: Alert, cooperative, no distress, appears stated age Head: Normocephalic, without obvious abnormality, atraumatic Eyes: PERRL, conjunctiva/corneas clear, EOM's intact, fundi benign, both eyes      Ears: Normal TM's and external ear canals, both ears Throat: Lips, mucosa, and tongue normal; teeth and gums normal Neck: Supple, symmetrical, trachea midline, no adenopathy; thyroid: No enlargement/tenderness/nodules; no carotid bruit or JVD Back: Symmetric, no curvature, ROM normal, no CVA tenderness Lungs: Clear to auscultation bilaterally, respirations unlabored Heart: Regular rate and rhythm, S1 and S2 normal, no murmur, rub or gallop Abdomen: Soft, non-tender, bowel sounds active all four quadrants, no masses, no organomegaly Extremities: Extremities  normal, atraumatic, no cyanosis or edema Pulses: 2+ and symmetric all extremities Skin: Skin color, texture, turgor normal, no rashes or lesions  NEUROLOGIC:   Mental status: Alert and oriented x4,  no aphasia, good attention span, fund of knowledge, and memory Motor Exam - grossly normal, mild spasticity, grips 4/5 Sensory Exam - grossly normal Reflexes: decreased Coordination - grossly normal Gait - grossly normal Balance - grossly normal Cranial Nerves: I: smell Not tested  II: visual acuity  OS: nl    OD: nl  II: visual fields Full to confrontation  II: pupils Equal, round, reactive to light  III,VII: ptosis None  III,IV,VI: extraocular muscles  Full ROM  V: mastication Normal  V: facial light touch sensation  Normal  V,VII: corneal reflex  Present  VII: facial muscle function - upper  Normal  VII: facial muscle function - lower Normal  VIII: hearing Not tested  IX: soft palate elevation  Normal  IX,X: gag reflex Present  XI: trapezius strength  5/5  XI: sternocleidomastoid strength 5/5  XI: neck flexion strength  5/5  XII: tongue strength  Normal    Data Review Lab Results  Component Value Date   WBC 12.9* 04/21/2011   HGB 14.4 04/21/2011   HCT 42.5 04/21/2011   MCV 92.2 04/21/2011   PLT 158 04/21/2011   Lab Results  Component Value Date   NA 142 04/21/2011   K 4.4 04/21/2011   CL 104 04/21/2011   CO2 29 04/21/2011   BUN 18 04/21/2011   CREATININE 1.10 04/21/2011   GLUCOSE 58* 04/21/2011   Lab Results  Component Value Date   INR 1.05 04/21/2011    Assessment/Plan: Patient admitted for lumbar stenosis and DLL. Patient has failed conservative therapy.  I explained the condition and procedure to the patient and answered any questions.  Patient wishes to  proceed with procedure as planned. Understands risks/ benefits and typical outcomes of procedure.   Mohamad Bruso S 04/29/2011 11:37 AM

## 2011-04-29 NOTE — OR Nursing (Signed)
Foley was inserted in OR per Surgeon and foreskin was hard to retract, mild bleeding from tip of foreskin, Surgeon notified.

## 2011-04-29 NOTE — Transfer of Care (Signed)
Immediate Anesthesia Transfer of Care Note  Patient: [name]  Procedure(s) Performed:  LUMBAR LAMINECTOMY/DECOMPRESSION MICRODISCECTOMY - Lumbar Two, Three, Four, Five Decompressive Lumbar Laminectomies  Patient Location: PACU  Anesthesia Type: General  Level of Consciousness: awake, alert , oriented and patient cooperative  Airway & Oxygen Therapy: Patient Spontanous Breathing and Patient connected to face mask oxygen  Post-op Assessment: Report given to PACU RN, Post -op Vital signs reviewed and stable and Patient moving all extremities  Post vital signs: Reviewed and stable Filed Vitals:   04/29/11 1445  BP:   Pulse:   Temp: 36.3 C  Resp:     Complications: No apparent anesthesia complications

## 2011-04-29 NOTE — Preoperative (Signed)
Beta Blockers   Reason not to administer Beta Blockers:Not Applicable, Took coreg 1/16 am

## 2011-04-29 NOTE — Progress Notes (Signed)
sppoke with Leta Jungling (medtr[employer] rep) ... She will come ... Her cell phone number is [phone]

## 2011-04-30 ENCOUNTER — Encounter (HOSPITAL_COMMUNITY): Payer: Self-pay | Admitting: Neurological Surgery

## 2011-04-30 LAB — GLUCOSE, CAPILLARY
Glucose-Capillary: 91 mg/dL (ref 70–99)
Glucose-Capillary: 113 mg/dL — ABNORMAL HIGH (ref 70–99)

## 2011-04-30 MED FILL — Dexamethasone Sodium Phosphate Inj 10 MG/ML: INTRAMUSCULAR | Qty: 1 | Status: AC

## 2011-04-30 NOTE — Progress Notes (Signed)
PT. UP TO BATHROOM AT 0445 AND VOIDING 350CC OF BLOOD TINGED URINE WITHOUT ANY DIFFICULTIES.   CHRIS Leeandra Ellerson RN

## 2011-04-30 NOTE — Progress Notes (Signed)
UR COMPLETED  

## 2011-04-30 NOTE — Progress Notes (Signed)
Physical Therapy Evaluation Patient Details Name: [name] MRN: [id number] DOB: 1930-05-16 Today's Date: 04/30/2011 [id number] Ev2  Problem List:  Patient Active Problem List  Diagnoses  . ADENOCARCINOMA, PROSTATE  . DIABETES MELLITUS, TYPE II  . UNSPECIFIED VITAMIN D DEFICIENCY  . HYPERLIPIDEMIA  . ANEMIA-IRON DEFICIENCY  . TOBACCO ABUSE  . CAROTIDYNIA  . PERIPHERAL NEUROPATHY  . VENTRICULAR FIBRILLATION  . COPD  . PULMONARY NODULE, RIGHT LOWER LOBE  . BENIGN PROSTATIC HYPERTROPHY  . PNEUMONIA, HX OF  . ABSCESS, PERIRECTAL, HX OF  . CONSTIPATION, CHRONIC  . Leg pain, bilateral  . Cardiomyopathy, dilated, nonischemic  . Atrial fibrillation    Past Medical History:  Past Medical History  Diagnosis Date  . COPD (chronic obstructive pulmonary disease)     GOLD II; Spirometry 07/10/2008 >FEV1 1.46 56% predicted, ratio of 66%  . Anemia     iron deficiency anemia with previous severe GI bleed   . Hypertension   . Hyperlipidemia   . Left bundle branch block   . Prostatic hypertrophy 09-11-97    Benign  . Obesity   . CHF (congestive heart failure)     secondary to nonischemic cardiomyopathy; ECHO 10/07 EF 20-25%, mod to severe MR; ECHO 1/10 EF 55-60%, mild MR; ECHO 2/11 55-65%, S/P Medtronic BiV ICD with biventricular function now turned off due to diahragmatic simulation  . CAD (coronary artery disease)     Mild, nonobstructive  . Atrial fibrillation     s/p AV node ablation; not on coumadin due to GIB  . Cardiac arrest - ventricular fibrillation 02/2008    Aborted, shocked by ICD  . Dyspnea     exertional  . Pneumonia     01/2011  . Diabetes mellitus     type 2 NIDDM x 5-6 yrs  . GERD (gastroesophageal reflux disease)   . Cancer     prostate  . Arthritis     back and knees  . 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 Inguinal hernia     left; asymptomatic   Past Surgical History:  Past Surgical History  Procedure Date  . Lobectomy  01-21-98    lung  . Rotator cuff repair 1994 and 1995  . Knee surgery 1996    Left  . Cardiac catheterization 01/2006    Showed mild nonobstructive CAD  . Cardiac catheterization 11/05/2006    Right atrial pressure mean of 12, RV pressure 36/8, PA pressure 39/16 witha mean of 28, wedge pressure was 20. Fick cardiac output was 5 liters per minute, cardiac index was 2.4  . Esophagogastroduodenoscopy 09/1997    Prepylor ulcer, esoph ring, duod avm  . Colonoscopy w/ polypectomy 11/1997    Divertics, int hemms  . Mch gi bleed 02/07-02/12/2002  . Esophagogastroduodenoscopy 05/22/2002    Poss Barrett's   . Colonoscopy 06/26/2002    Multip (neg) divertics, int hemm  . Colonoscopy 07/10/2004    Poyps, divertics, int hemms  . Mch sob 10/11-10/18/2007    A fib, CHF  . Esophagogastroduodenoscopy 02/19/2006    HH No active bleeding  . Ct head limited w/o cm 10/03/2006    No acute abnmlty  . Coronary angioplasty     Min abstrut zd severe LB dystn EF 25%  . Mch x 11/08-11/03/2006    Acute blood loss, anemia, sys HF, isch cardiomyopathy  . Hosp 8/14-8/23/2008    Acute on chronis CHF IIIB NOnisch Cardiomyop EF 20-25% Mod-Sev MR  .  Colonoscopy 06/19/2008    2 polyps divertics int hemms (Dr Marina Goodell)  . Insert / replace / remove pacemaker 2007  . Eye surgery     cataract, bilateral  . Anterior cervical discectomy 02/2010  . Knee arthroscopy 1988    PT Assessment/Plan/Recommendation PT Assessment Clinical Impression Statement: Patient s/p decompressive lumbar laminectomy at 3 lumbar levels presents with decreased mobility, acute pain, poor activity tolerance and decreased knowledge of precautions.  Will benefit from skilled PT to progess mobility as tolerated in hopes he might go home with HHPT vs. to STSNF.   PT Recommendation/Assessment: Patient will need skilled PT in the acute care venue PT Problem List: Decreased strength;Decreased range of motion;Decreased activity  tolerance;Pain;Decreased mobility;Decreased knowledge of precautions PT Therapy Diagnosis : Abnormality of gait;Difficulty walking;Acute pain PT Plan PT Frequency: Min 6X/week PT Treatment/Interventions: Functional mobility training;Stair training;Gait training;DME instruction;Patient/family education;Therapeutic activities PT Recommendation Follow Up Recommendations: Home health PT;Skilled nursing facility (depending on progress) Equipment Recommended: None recommended by PT PT Goals  Acute Rehab PT Goals PT Goal Formulation: With patient Time For Goal Achievement: 7 days Pt will Roll Supine to Right Side: with modified independence;with rail PT Goal: Rolling Supine to Right Side - Progress: Goal set today Pt will Roll Supine to Left Side: with modified independence;with rail PT Goal: Rolling Supine to Left Side - Progress: Goal set today Pt will go Supine/Side to Sit: with modified independence;with rail PT Goal: Supine/Side to Sit - Progress: Goal set today Pt will Sit at Edge of Bed: Independently;3-5 min;with unilateral upper extremity support (during simple funtional task) PT Goal: Sit at [place] Of Bed - Progress: Goal set today Pt will go Sit to Stand: with modified independence PT Goal: Sit to Stand - Progress: Goal set today Pt will go Stand to Sit: with modified independence PT Goal: Stand to Sit - Progress: Goal set today Pt will Ambulate: >150 feet;with supervision PT Goal: Ambulate - Progress: Progressing toward goal Pt will Go Up / Down Stairs: 1-2 stairs;with rail(s);with supervision PT Goal: Up/Down Stairs - Progress: Goal set today Pt will Perform Home Exercise Program: with supervision, verbal cues required/provided PT Goal: Perform Home Exercise Program - Progress: Goal set today  PT Evaluation Precautions/Restrictions  Precautions Precautions: Back Precaution Booklet Issued: No Precaution Comments: difficulty following precautions for sit to side/supine Prior  Functioning  Home Living Lives With: Spouse Receives Help From: Friend(s) (daughter) Type of Home: House Home Layout: One level Home Access: Stairs to enter Entrance Stairs-Rails: Doctor, general practice of Steps: 2 in front, 3 in back with no rails Bathroom Shower/Tub: Health visitor: Standard (vanity beside) Home Adaptive Equipment: Shower chair with back;Walker - rolling Armed forces technical officer) Prior Function Level of Independence: Independent with basic ADLs;Requires assistive device for independence;Independent with gait;Independent with transfers Driving: Yes Cognition Cognition Arousal/Alertness: Awake/alert Overall Cognitive Status: Appears within functional limits for tasks assessed Orientation Level: Oriented X4 Sensation/Coordination   Extremity Assessment RLE Assessment RLE Assessment: Exceptions to [facility] RLE AROM (degrees) Overall AROM Right Lower Extremity: Due to pain;Deficits RLE Strength RLE Overall Strength: Deficits;Due to pain RLE Overall Strength Comments: not formally tested, but reports right hip gives away at times with walking LLE Assessment LLE Assessment: Exceptions to [facility] LLE AROM (degrees) Overall AROM Left Lower Extremity: Deficits;Due to pain LLE Strength LLE Overall Strength: Deficits;Due to pain LLE Overall Strength Comments: not formally tested, but demonstrates slightly improved strength with functional activities as compared to right Mobility (including Balance) Bed Mobility Bed Mobility: Yes Sit  to Supine: 3: Mod assist;HOB elevated (comment degrees) Sit to Supine - Details (indicate cue type and reason): increased pain with transition to supine (unable to perform through sidelying despite education on precautions.)  Eventually positioned with relief in supine with pillow at knees Transfers Transfers: Yes Sit to Stand: 4: Min assist;With upper extremity assist;With armrests;From chair/3-in-1 Sit to Stand Details  (indicate cue type and reason): minguard assist Stand to Sit: 4: Min assist;With upper extremity assist;To bed Stand to Sit Details: cues for positioning Ambulation/Gait Ambulation/Gait: Yes Ambulation/Gait Assistance: 4: Min assist Ambulation/Gait Assistance Details (indicate cue type and reason): cues for safety with walker to keep closer.  Side stepped through narrow space beside foot of bed and wall Ambulation Distance (Feet): 20 Feet Assistive device: Rolling walker Gait Pattern: Trunk flexed;Decreased stride length (c/o right hip instability, but increased rt wt shift)    Exercise    End of Session PT - End of Session Equipment Utilized During Treatment: Gait belt Activity Tolerance: Patient limited by pain Patient left: in bed;with call bell in reach General Behavior During Session: [facility] for tasks performed Cognition: [facility] for tasks performed  [facility] 04/30/2011, 2:41 PM

## 2011-04-30 NOTE — Progress Notes (Signed)
PT. UNABLE TO VOID AFTER SEVERAL ATTEMPTS.  BLADDER SCAN AT 0025 SHOWING 844CC IN THE BLADDER.  IN & OUT CATH AT [phone] WITH 850CC OF URINT RECEIVED.  SOME RESISTANCE WHEN TUBE FIRST STARTED IN THE URETHA BUT PT. TOLERATED WELL.  CHRIS Alanea Woolridge RN

## 2011-04-30 NOTE — Progress Notes (Signed)
Patient ID: [name], male   DOB: November 12, 1930, 78 y.o.   MRN: [id number] Subjective: Patient reports appropriate back soreness. No leg pain. Still hasn't walked much and a little unstable on feet.  Objective: Vital signs in last 24 hours: Temp:  [97.4 F (36.3 C)-98.3 F (36.8 C)] 98.3 F (36.8 C) (01/17 0400) Pulse Rate:  [40-83] 40  (01/17 0400) Resp:  [11-20] 20  (01/17 0400) BP: (80-133)/(50-88) 94/52 mmHg (01/17 0400) SpO2:  [86 %-100 %] 95 % (01/17 0400) Arterial Line BP: (102-160)/(43-93) 159/93 mmHg (01/16 1608)  Intake/Output from previous day: 01/16 0701 - 01/17 0700 In: 1580 [P.O.:480; I.V.:1100] Out: 2540 [Urine:2075; Drains:390; Blood:75] Intake/Output this shift:    Neurologic: Mental status: Alert, oriented, thought content appropriate Motor: seems strong to in bed exam Gait: Antalgic Wound: CDI  Lab Results: Lab Results  Component Value Date   WBC 12.9* 04/21/2011   HGB 14.4 04/21/2011   HCT 42.5 04/21/2011   MCV 92.2 04/21/2011   PLT 158 04/21/2011   Lab Results  Component Value Date   INR 1.05 04/21/2011   BMET Lab Results  Component Value Date   NA 142 04/21/2011   K 4.4 04/21/2011   CL 104 04/21/2011   CO2 29 04/21/2011   GLUCOSE 58* 04/21/2011   BUN 18 04/21/2011   CREATININE 1.10 04/21/2011   CALCIUM 9.7 04/21/2011    Studies/Results: Dg Lumbar Spine 2-3 Views  04/29/2011  *RADIOLOGY REPORT*  Clinical Data: L2-5 discectomy.  LUMBAR SPINE - 2-3 VIEW  Comparison: CT lumbar spine 12/01/2010.  Findings: We are provided with two intraoperative views of the lumbar spine in the lateral projection.  On film labeled #1, metallic probes are at the level of the L3 and L4 pedicles.  On film labeled #2, probes at the level of the L2 and L5 pedicles.  IMPRESSION: Localization as above.  Original Report Authenticated By: [name]. [name], M.D.    Assessment/Plan: Has urinary retention (PVR >999), insert foley, PT, cont hemovac   LOS: 1 day    [name]  S 04/30/2011, 7:41 AM

## 2011-05-01 ENCOUNTER — Other Ambulatory Visit: Payer: Medicare Other

## 2011-05-01 LAB — GLUCOSE, CAPILLARY
Glucose-Capillary: 56 mg/dL — ABNORMAL LOW (ref 70–99)
Glucose-Capillary: 64 mg/dL — ABNORMAL LOW (ref 70–99)
Glucose-Capillary: 90 mg/dL (ref 70–99)
Glucose-Capillary: 48 mg/dL — ABNORMAL LOW (ref 70–99)

## 2011-05-01 MED ORDER — BOOST PLUS PO LIQD
237.0000 mL | Freq: Three times a day (TID) | ORAL | Status: DC
  Administered 2011-05-02 – 2011-05-04 (×7): 237 mL via ORAL
  Filled 2011-05-01 (×14): qty 237

## 2011-05-01 NOTE — Progress Notes (Signed)
Patient ID: [name], male   DOB: May 05, 1930, 78 y.o.   MRN: [id number] NEUROSURGERY PROGRESS NOTE  Doing fair. Complains of appropriate back soreness, but also has R hip pain. Slow to mobilize, No numbness, tingling or weakness Ambulating with assistance Good strength distally Incision CDI  Temp:  [97 F (36.1 C)-98.4 F (36.9 C)] 97 F (36.1 C) (01/18 0800) Pulse Rate:  [40-80] 40  (01/18 0800) Resp:  [16-18] 18  (01/18 0800) BP: (71-119)/(36-66) 91/52 mmHg (01/18 0800) SpO2:  [93 %-99 %] 98 % (01/18 0400)  Plan: To 3000, PT, rehab?  Tia Alert, MD 05/01/2011 10:16 AM

## 2011-05-01 NOTE — Progress Notes (Signed)
Agree with SNF recommendation.  05/01/2011 Cephus Shelling, PT, DPT [phone]

## 2011-05-01 NOTE — Progress Notes (Signed)
Physical Therapy Treatment Patient Details Name: [name] MRN: [id number] DOB: 1930-06-04 Today's Date: 05/01/2011  PT Assessment/Plan  PT - Assessment/Plan Comments on Treatment Session: Pt continues to be limited by pain but when asked pain level he states he has none. Pts wife present throughout session and in no shape to take care of patient when he discharges from hospital. Nurse notified me that patients daughter does not live with them. Nurse stated that patient tends to take care of his wife. Educated patient and patients wife that he is not safe to discharge home at this time and that more rehab would be beneficial. Pt hesitant to rehab before discharging home.  PT Plan: Discharge plan remains appropriate PT Frequency: Min 6X/week Follow Up Recommendations: Skilled nursing facility Equipment Recommended: None recommended by PT PT Goals  Acute Rehab PT Goals PT Goal: Rolling Supine to Right Side - Progress: Progressing toward goal PT Goal: Rolling Supine to Left Side - Progress: Progressing toward goal PT Goal: Supine/Side to Sit - Progress: Progressing toward goal PT Goal: Sit to Stand - Progress: Progressing toward goal PT Goal: Stand to Sit - Progress: Progressing toward goal PT Goal: Ambulate - Progress: Progressing toward goal  PT Treatment Precautions/Restrictions  Precautions Precautions: Back Precaution Booklet Issued: No Precaution Comments: difficulty following precautions for sit to side/supine Mobility (including Balance) Bed Mobility Left Sidelying to Sit: 4: Min assist;HOB flat Sit to Sidelying Right: 4: Min assist;HOB flat Sit to Sidelying Right Details (indicate cue type and reason): Min A for safe log roll technique Transfers Sit to Stand: 4: Min assist;From bed Sit to Stand Details (indicate cue type and reason): x3. Pt required A to initiate stand. Pt with posterior lean. Pt with increase trunk flexion with stand Stand to Sit: 4: Min assist;To  bed;To chair/3-in-1 Stand to Sit Details: Pt with 2 sudden sits to bed. Cues for safe technique.  Ambulation/Gait Ambulation/Gait Assistance: 4: Min assist Ambulation/Gait Assistance Details (indicate cue type and reason): Cues for safety and to keep RW closer to body. Pt with flexed posture. Pt with knee buckling when attempt to stand fully upright Ambulation Distance (Feet): 10 Feet Assistive device: Rolling walker Gait Pattern: Step-to pattern;Decreased stride length;Trunk flexed;Shuffle    Exercise    End of Session PT - End of Session Equipment Utilized During Treatment: Gait belt Activity Tolerance: Patient limited by pain Patient left: in chair;with call bell in reach;with family/visitor present Nurse Communication: Mobility status for transfers;Mobility status for ambulation General Behavior During Session: [facility] for tasks performed Cognition: [facility] for tasks performed  [name], [name] 05/01/2011, 1:14 PM 05/01/2011 [name] PTA [phone] pager [phone] office

## 2011-05-02 ENCOUNTER — Inpatient Hospital Stay (HOSPITAL_COMMUNITY): Payer: Medicare Other

## 2011-05-02 DIAGNOSIS — M5126 Other intervertebral disc displacement, lumbar region: Secondary | ICD-10-CM | POA: Diagnosis not present

## 2011-05-02 DIAGNOSIS — M5137 Other intervertebral disc degeneration, lumbosacral region: Secondary | ICD-10-CM | POA: Diagnosis not present

## 2011-05-02 LAB — CBC
HCT: 31.3 % — ABNORMAL LOW (ref 39.0–52.0)
Hemoglobin: 10.6 g/dL — ABNORMAL LOW (ref 13.0–17.0)
MCV: 92.1 fL (ref 78.0–100.0)
RBC: 3.4 MIL/uL — ABNORMAL LOW (ref 4.22–5.81)
RDW: 14.3 % (ref 11.5–15.5)
WBC: 17.5 10*3/uL — ABNORMAL HIGH (ref 4.0–10.5)

## 2011-05-02 LAB — GLUCOSE, CAPILLARY
Glucose-Capillary: 47 mg/dL — ABNORMAL LOW (ref 70–99)
Glucose-Capillary: 41 mg/dL — CL (ref 70–99)
Glucose-Capillary: 236 mg/dL — ABNORMAL HIGH (ref 70–99)

## 2011-05-02 LAB — BASIC METABOLIC PANEL
BUN: 25 mg/dL — ABNORMAL HIGH (ref 6–23)
CO2: 26 mEq/L (ref 19–32)
Chloride: 99 mEq/L (ref 96–112)
Creatinine, Ser: 1.08 mg/dL (ref 0.50–1.35)
GFR calc Af Amer: 74 mL/min — ABNORMAL LOW (ref >90)
Potassium: 4.7 mEq/L (ref 3.5–5.1)

## 2011-05-02 LAB — URINALYSIS, ROUTINE W REFLEX MICROSCOPIC
Bilirubin Urine: NEGATIVE
Nitrite: NEGATIVE
Specific Gravity, Urine: 1.012 (ref 1.005–1.030)
Urobilinogen, UA: 0.2 mg/dL (ref 0.0–1.0)
pH: 6 (ref 5.0–8.0)

## 2011-05-02 LAB — URINE MICROSCOPIC-ADD ON

## 2011-05-02 MED ORDER — LISINOPRIL 10 MG PO TABS
10.0000 mg | ORAL_TABLET | Freq: Every day | ORAL | Status: DC
  Administered 2011-05-03 – 2011-05-04 (×2): 10 mg via ORAL
  Filled 2011-05-02 (×3): qty 1

## 2011-05-02 MED ORDER — DEXAMETHASONE SODIUM PHOSPHATE 4 MG/ML IJ SOLN
4.0000 mg | Freq: Four times a day (QID) | INTRAMUSCULAR | Status: AC
  Administered 2011-05-02 – 2011-05-03 (×4): 4 mg via INTRAVENOUS
  Filled 2011-05-02 (×4): qty 1

## 2011-05-02 MED ORDER — FUROSEMIDE 40 MG PO TABS
40.0000 mg | ORAL_TABLET | Freq: Every day | ORAL | Status: DC
  Administered 2011-05-03 – 2011-05-04 (×2): 40 mg via ORAL
  Filled 2011-05-02 (×3): qty 1

## 2011-05-02 MED ORDER — SODIUM CHLORIDE 0.9 % IV SOLN
INTRAVENOUS | Status: DC
  Administered 2011-05-02 – 2011-05-04 (×4): via INTRAVENOUS

## 2011-05-02 MED ORDER — OXYCODONE-ACETAMINOPHEN 5-325 MG PO TABS
1.0000 | ORAL_TABLET | ORAL | Status: DC | PRN
  Administered 2011-05-02 (×3): 2 via ORAL
  Administered 2011-05-03 (×2): 1 via ORAL
  Administered 2011-05-03: 2 via ORAL
  Filled 2011-05-02: qty 1
  Filled 2011-05-02 (×2): qty 2
  Filled 2011-05-02: qty 1
  Filled 2011-05-02 (×2): qty 2

## 2011-05-02 MED ORDER — DEXTROSE 50 % IV SOLN
INTRAVENOUS | Status: AC
  Administered 2011-05-02: 04:00:00
  Filled 2011-05-02: qty 50

## 2011-05-02 MED ORDER — POTASSIUM CHLORIDE 2 MEQ/ML IV SOLN
INTRAVENOUS | Status: DC
  Administered 2011-05-02 (×2): via INTRAVENOUS
  Filled 2011-05-02 (×2): qty 1000

## 2011-05-02 MED ORDER — GLUCOSE 40 % PO GEL
ORAL | Status: AC
  Administered 2011-05-02: 07:00:00
  Filled 2011-05-02: qty 1

## 2011-05-02 NOTE — Progress Notes (Signed)
Hypoglycemic episodes x 2 of 48, 47 & 38 between 2210 and 0300.  Initiated protocol & patient alert,oriented, conversing with spouse and staff.  Ate hs snack of yogart, graham crackers/peanut butter, milk 2% and 1 grape juice. Additional 2 choc ice-creams given, recheck revealed cbg 66f 38 and multiple attempts to restart IV per 2 staff, rapid response and IV team.  2 new sites obtained and D50 bolus IV given.  Immediately glucose rose to 191.  VS at 89/50 and 89/60/  P70-71.  Restarted IVF of NS + KCL at 75/hr.

## 2011-05-02 NOTE — Progress Notes (Signed)
PT Cancellation Note  Treatment cancelled today due to patient receiving procedure or test. Pt down for a CT scan at this time. Will follow up later today vs another day as time allows.  Sallyanne Kuster 05/02/2011, 12:24 PM  Sallyanne Kuster, PTA Office- [phone]

## 2011-05-02 NOTE — Progress Notes (Signed)
Patient ID: [name], male   DOB: 1931/02/15, 78 y.o.   MRN: [id number] Subjective: Patient reports severe back pain, denies leg pain or N/T. Voiding better.   Objective: Vital signs in last 24 hours: Temp:  [97.4 F (36.3 C)-99.3 F (37.4 C)] 99.3 F (37.4 C) (01/19 0600) Pulse Rate:  [62-71] 69  (01/19 0600) Resp:  [18-20] 18  (01/19 0600) BP: (76-99)/(50-60) 76/53 mmHg (01/19 0600) SpO2:  [96 %-98 %] 98 % (01/19 0600)  Intake/Output from previous day: 01/18 0701 - 01/19 0700 In: 480 [P.O.:480] Out: 1400 [Urine:1400] Intake/Output this shift:    Neurologic: Motor: seems strong to seated exam  Lab Results: Lab Results  Component Value Date   WBC 12.9* 04/21/2011   HGB 14.4 04/21/2011   HCT 42.5 04/21/2011   MCV 92.2 04/21/2011   PLT 158 04/21/2011   Lab Results  Component Value Date   INR 1.05 04/21/2011   BMET Lab Results  Component Value Date   NA 142 04/21/2011   K 4.4 04/21/2011   CL 104 04/21/2011   CO2 29 04/21/2011   GLUCOSE 58* 04/21/2011   BUN 18 04/21/2011   CREATININE 1.10 04/21/2011   CALCIUM 9.7 04/21/2011    Studies/Results: No results found.  Assessment/Plan: Increasing back pain...will get CT L-spine. Blood sugars low. Will hold amaryl for now and change diet. BP low, will cont IVF and hold lisinopril and lasix.    LOS: 3 days    JONES,DAVID S 05/02/2011, 8:19 AM

## 2011-05-03 LAB — GLUCOSE, CAPILLARY
Glucose-Capillary: 179 mg/dL — ABNORMAL HIGH (ref 70–99)
Glucose-Capillary: 167 mg/dL — ABNORMAL HIGH (ref 70–99)
Glucose-Capillary: 130 mg/dL — ABNORMAL HIGH (ref 70–99)

## 2011-05-03 MED ORDER — GLIMEPIRIDE 4 MG PO TABS
4.0000 mg | ORAL_TABLET | Freq: Every day | ORAL | Status: DC
  Administered 2011-05-04: 4 mg via ORAL
  Filled 2011-05-03 (×2): qty 1

## 2011-05-03 NOTE — Progress Notes (Signed)
Physical Therapy Treatment Patient Details Name: [name] MRN: [id number] DOB: 02/02/1931 Today's Date: 05/03/2011  PT Assessment/Plan  PT - Assessment/Plan Comments on Treatment Session: Pts daughter present throughout session and was able to understand the assist her father needs upon d/c. Pt stated that she can take care of her father when he discharges as needed and they are still hesitant to SNF. Explained the importance and benefits of further rehabilitation before d/c for safety. Pt with little to no improvements still requiring lots of assistance for all mobility. PT Plan: Discharge plan remains appropriate PT Frequency: Min 6X/week Follow Up Recommendations: Skilled nursing facility (24 hr support if d/c home) Equipment Recommended: None recommended by PT PT Goals  Acute Rehab PT Goals PT Goal Formulation: With patient PT Goal: Sit to Stand - Progress: Not progressing PT Goal: Stand to Sit - Progress: Progressing toward goal PT Goal: Ambulate - Progress: Progressing toward goal PT Goal: Up/Down Stairs - Progress: Not progressing  PT Treatment Precautions/Restrictions  Precautions Precautions: Back Precaution Booklet Issued: No Precaution Comments: difficulty following precautions for sit to side/supine Restrictions Weight Bearing Restrictions: No Mobility (including Balance) Bed Mobility Bed Mobility: No Transfers Transfers: Yes Sit to Stand: 3: Mod assist;From chair/3-in-1;With upper extremity assist Sit to Stand Details (indicate cue type and reason): Assist for stability into standing. VC for proper posture. pt with increased flexed posture upon standing and is unable to straighten Stand to Sit: 4: Min assist;With upper extremity assist;To chair/3-in-1 Stand to Sit Details: VC for hand placement. Pt able to control descent Ambulation/Gait Ambulation/Gait: Yes Ambulation/Gait Assistance: 3: Mod assist Ambulation/Gait Assistance Details (indicate cue type and  reason): VC for safety with RW and postural cues throughout. Pt unable to achieve any upright posture and is completely flexed over RW. Mod assist for stability while walking with daughter on other side with min guard assist. Pt with difficulty taking steps, with extremely short step lengths. Ambulation Distance (Feet): 20 Feet Assistive device: Rolling walker Gait Pattern: Step-to pattern;Decreased stride length;Trunk flexed;Shuffle    End of Session PT - End of Session Equipment Utilized During Treatment: Gait belt Activity Tolerance: Patient limited by pain Patient left: in chair;with call bell in reach;with family/visitor present Nurse Communication: Mobility status for transfers;Mobility status for ambulation General Behavior During Session: [facility] for tasks performed Cognition: [facility] for tasks performed  Milana Kidney 05/03/2011, 1:15 PM  05/03/2011 Milana Kidney DPT PAGER: [phone] OFFICE: [phone]

## 2011-05-03 NOTE — Progress Notes (Signed)
Patient ID: [name], male   DOB: [birth year], 78 y.o.   MRN: [id number] Subjective: Patient reports continued low back pain around the incision. He states he still has right hip pain, no leg pain he denies any numbness tingling in the legs. He has trouble standing because of back pain and describes a weakness in his back. He feels like his right hip will "go out."  Objective: Vital signs in last 24 hours: Temp:  [98.4 F (36.9 C)-99.2 F (37.3 C)] 98.7 F (37.1 C) (01/20 0600) Pulse Rate:  [69-75] 71  (01/20 0600) Resp:  [16-19] 19  (01/20 0600) BP: (93-177)/(58-79) 148/74 mmHg (01/20 0600) SpO2:  [94 %-99 %] 97 % (01/20 0823)  Intake/Output from previous day: 01/19 0701 - 01/20 0700 In: 895 [P.O.:120; I.V.:775] Out: 2530 [Urine:2530] Intake/Output this shift:    PE:  his incision looks fine. There is no swelling. He seems to have good strength in his lower extremities to a seated exam, but does have trouble standing and stands in a very flexed posture holding onto his walker. He has increasing back pain with standing.  Lab Results: Lab Results  Component Value Date   WBC 17.5* 05/02/2011   HGB 10.6* 05/02/2011   HCT 31.3* 05/02/2011   MCV 92.1 05/02/2011   PLT PLATELET CLUMPS NOTED ON SMEAR, COUNT APPEARS ADEQUATE 05/02/2011   Lab Results  Component Value Date   INR 1.05 04/21/2011   BMET Lab Results  Component Value Date   NA 134* 05/02/2011   K 4.7 05/02/2011   CL 99 05/02/2011   CO2 26 05/02/2011   GLUCOSE 58* 05/02/2011   BUN 25* 05/02/2011   CREATININE 1.08 05/02/2011   CALCIUM 9.5 05/02/2011    Studies/Results: Ct Lumbar Spine Wo Contrast  05/02/2011  *RADIOLOGY REPORT*  Clinical Data: Back pain status post lumbar laminectomy 04/29/2011. History of diabetes and prostate cancer.  CT LUMBAR SPINE WITHOUT CONTRAST  Technique:  Multidetector CT imaging of the lumbar spine was performed without intravenous contrast administration. Multiplanar CT image reconstructions  were also generated.  Comparison: Intraoperative radiographs of 04/29/2011.  Preoperative CT 12/01/2010.  Findings: Decompressive laminectomies have been performed from L2- L4 in the interval.  There is expected postsurgical change in the posterior paraspinal soft tissues with ill-defined fluid.  Air tracking along the posterior aspect of the dura in the laminectomy bed is probably due to Gelfoam.  The overall alignment is stable.  There is a scoliosis convex to the right and L1-L2 and to the left at L4-L5.  There is leftward listhesis of L4 on L5.  Vacuum phenomenon is present throughout all of the lumbar disc.  No acute anterior paraspinal abnormalities are identified.  There are diffuse vascular calcifications, moderate distension of the urinary bladder and probable simple left renal cyst.  L1-L2:  Stable annular disc bulging with disc desiccation and calcification.  There is resulting mild to moderate central and biforaminal stenosis which appears unchanged.  L2-L3:  Interval laminectomy with good decompression of the spinal canal.  Asymmetric disc bulging and facet hypertrophy on the left contribute to mild residual left foraminal and left lateral recess stenosis.  The right foramen appears widely patent.  L3-L4:  Interval laminectomy with good decompression of the spinal canal. There is moderate biforaminal stenosis due to calcified disc material, not significantly changed.  L4-L5:  Spinal canal appears adequately decompressed by the interval laminectomies.  There is advanced facet hypertrophy on the right with asymmetric disc bulging and osteophytes contributing  to persistent severe right foraminal stenosis.  The left foramen demonstrates stable mild narrowing.  L5-S1:  Stable annular disc bulging, facet and ligamentous hypertrophy contributing to mild central and biforaminal stenosis.  IMPRESSION:  1.  Interval L2-L4 laminectomy without demonstrated complication. There is presumed Gelfoam within the  laminectomy bed.  No suspicious fluid collection is identified. 2.  Stable alignment.  No evidence of acute fracture. 3.  The spinal canal is adequately decompressed at the operative levels.  There is residual foraminal narrowing as detailed above. 4.  No definite acute findings.  Original Report Authenticated By: Gerrianne Scale, M.D.    Assessment/Plan: He continues to struggle with postoperative pain. I am unsure of the etiology. A CT scan shows no evidence of postoperative complication. He does have advanced facet arthropathy and foraminal stenosis, but this is not new postoperatively. This could be simple postoperative pain, or could this be related to postoperative dynamic instability? This is very difficult answer. I would like to try to get him into rehabilitation or into a skilled nursing facility for continued recovery and strengthening.   LOS: 4 days    Asherah Lavoy S 05/03/2011, 8:46 AM

## 2011-05-03 NOTE — Progress Notes (Signed)
CSW met with pt and pt's family RE: SNF placement. Pt prefers to d/c home with [facility] and assist provided by family; pt's dtr lives across street, does not work, and reports able to provided as much assist as needed. Pt agreeable to SNF search as b/u plan and is requesting [facility]. FL2 on chart for MD signature. Will f/u with any offers. Dellie Burns, MSW, [place] [phone]

## 2011-05-04 LAB — GLUCOSE, CAPILLARY
Glucose-Capillary: 144 mg/dL — ABNORMAL HIGH (ref 70–99)
Glucose-Capillary: 91 mg/dL (ref 70–99)

## 2011-05-04 NOTE — Progress Notes (Signed)
Pt discharged home prior to CSW follow up. CSW informed [facility] SNF. CSW signing off as no further needs identified.   Dede Query, MSW, Theresia Majors [phone]

## 2011-05-04 NOTE — Discharge Summary (Signed)
Physician Discharge Summary  Patient ID: [name] MRN: [id number] DOB/AGE: 11-Jan-1931 78 y.o.  Admit date: 04/29/2011 Discharge date: 05/04/2011  Admission Diagnoses: spinal stenosis    Discharge Diagnoses: same   Discharged Condition: stable  Hospital Course: The patient was admitted on 04/29/2011 and taken to the operating room where the patient underwent Lum Laminectomy. The patient tolerated the procedure well and was taken to the recovery room and then to the floor in stable condition. The hospital course was routine other than urinary retention and difficulty with ambulation secondary to post-op back pain. He did require placement of a foley catheter for urinary retention, but that resolved. CT scan of the L spine showed adequate decompression without apparent complication. There were no other complications. The wound remained clean dry and intact. The patient remained afebrile with stable vital signs, and tolerated a regular diet. The patient continued to increase activities with PT/OT, and pain was well controlled with oral pain medications by 1/ 21/ 13 and patient asked for d/c home. He ambulated for me with his walker and did quite well, was much more stable.   Consults: none  Significant Diagnostic Studies:  Results for orders placed during the hospital encounter of 04/29/11  GLUCOSE, CAPILLARY      Component Value Range   Glucose-Capillary 100 (*) 70 - 99 (mg/dL)  GLUCOSE, CAPILLARY      Component Value Range   Glucose-Capillary 86  70 - 99 (mg/dL)  GLUCOSE, CAPILLARY      Component Value Range   Glucose-Capillary 70  70 - 99 (mg/dL)   Comment 1 Notify RN     Comment 2 Documented in Chart    GLUCOSE, CAPILLARY      Component Value Range   Glucose-Capillary 91  70 - 99 (mg/dL)   Comment 1 Documented in Chart     Comment 2 Notify RN    GLUCOSE, CAPILLARY      Component Value Range   Glucose-Capillary 99  70 - 99 (mg/dL)  GLUCOSE, CAPILLARY      Component  Value Range   Glucose-Capillary 94  70 - 99 (mg/dL)  GLUCOSE, CAPILLARY      Component Value Range   Glucose-Capillary 113 (*) 70 - 99 (mg/dL)  GLUCOSE, CAPILLARY      Component Value Range   Glucose-Capillary 74  70 - 99 (mg/dL)  GLUCOSE, CAPILLARY      Component Value Range   Glucose-Capillary 71  70 - 99 (mg/dL)  GLUCOSE, CAPILLARY      Component Value Range   Glucose-Capillary 64 (*) 70 - 99 (mg/dL)  GLUCOSE, CAPILLARY      Component Value Range   Glucose-Capillary 56 (*) 70 - 99 (mg/dL)  GLUCOSE, CAPILLARY      Component Value Range   Glucose-Capillary 90  70 - 99 (mg/dL)  GLUCOSE, CAPILLARY      Component Value Range   Glucose-Capillary 48 (*) 70 - 99 (mg/dL)  GLUCOSE, CAPILLARY      Component Value Range   Glucose-Capillary 47 (*) 70 - 99 (mg/dL)  GLUCOSE, CAPILLARY      Component Value Range   Glucose-Capillary 38 (*) 70 - 99 (mg/dL)  GLUCOSE, CAPILLARY      Component Value Range   Glucose-Capillary 191 (*) 70 - 99 (mg/dL)  GLUCOSE, CAPILLARY      Component Value Range   Glucose-Capillary 41 (*) 70 - 99 (mg/dL)  GLUCOSE, CAPILLARY      Component Value Range   Glucose-Capillary 101 (*)  70 - 99 (mg/dL)   Comment 1 Documented in Chart     Comment 2 Notify RN    CBC      Component Value Range   WBC 17.5 (*) 4.0 - 10.5 (K/uL)   RBC 3.40 (*) 4.22 - 5.81 (MIL/uL)   Hemoglobin 10.6 (*) 13.0 - 17.0 (g/dL)   HCT 56.2 (*) 13.0 - 52.0 (%)   MCV 92.1  78.0 - 100.0 (fL)   MCH 31.2  26.0 - 34.0 (pg)   MCHC 33.9  30.0 - 36.0 (g/dL)   RDW 86.5  78.4 - 69.6 (%)   Platelets    150 - 400 (K/uL)   Value: PLATELET CLUMPS NOTED ON SMEAR, COUNT APPEARS ADEQUATE  BASIC METABOLIC PANEL      Component Value Range   Sodium 134 (*) 135 - 145 (mEq/L)   Potassium 4.7  3.5 - 5.1 (mEq/L)   Chloride 99  96 - 112 (mEq/L)   CO2 26  19 - 32 (mEq/L)   Glucose, Bld 58 (*) 70 - 99 (mg/dL)   BUN 25 (*) 6 - 23 (mg/dL)   Creatinine, Ser 2.95  0.50 - 1.35 (mg/dL)   Calcium 9.5  8.4 - 28.4  (mg/dL)   GFR calc non Af Amer 64 (*) >90 (mL/min)   GFR calc Af Amer 74 (*) >90 (mL/min)  GLUCOSE, CAPILLARY      Component Value Range   Glucose-Capillary 60 (*) 70 - 99 (mg/dL)  URINALYSIS, ROUTINE W REFLEX MICROSCOPIC      Component Value Range   Color, Urine YELLOW  YELLOW    APPearance CLOUDY (*) CLEAR    Specific Gravity, Urine 1.012  1.005 - 1.030    pH 6.0  5.0 - 8.0    Glucose, UA 100 (*) NEGATIVE (mg/dL)   Hgb urine dipstick LARGE (*) NEGATIVE    Bilirubin Urine NEGATIVE  NEGATIVE    Ketones, ur NEGATIVE  NEGATIVE (mg/dL)   Protein, ur NEGATIVE  NEGATIVE (mg/dL)   Urobilinogen, UA 0.2  0.0 - 1.0 (mg/dL)   Nitrite NEGATIVE  NEGATIVE    Leukocytes, UA TRACE (*) NEGATIVE   GLUCOSE, CAPILLARY      Component Value Range   Glucose-Capillary 236 (*) 70 - 99 (mg/dL)   Comment 1 Documented in Chart     Comment 2 Notify RN    URINE MICROSCOPIC-ADD ON      Component Value Range   WBC, UA 0-2  <3 (WBC/hpf)   RBC / HPF 11-20  <3 (RBC/hpf)   Urine-Other AMORPHOUS URATES/PHOSPHATES    GLUCOSE, CAPILLARY      Component Value Range   Glucose-Capillary 262 (*) 70 - 99 (mg/dL)   Comment 1 Documented in Chart     Comment 2 Notify RN    GLUCOSE, CAPILLARY      Component Value Range   Glucose-Capillary 179 (*) 70 - 99 (mg/dL)   Comment 1 Documented in Chart     Comment 2 Notify RN    GLUCOSE, CAPILLARY      Component Value Range   Glucose-Capillary 167 (*) 70 - 99 (mg/dL)   Comment 1 Documented in Chart     Comment 2 Notify RN    GLUCOSE, CAPILLARY      Component Value Range   Glucose-Capillary 216 (*) 70 - 99 (mg/dL)  GLUCOSE, CAPILLARY      Component Value Range   Glucose-Capillary 130 (*) 70 - 99 (mg/dL)  GLUCOSE, CAPILLARY      Component  Value Range   Glucose-Capillary 144 (*) 70 - 99 (mg/dL)   Comment 1 Notify RN    GLUCOSE, CAPILLARY      Component Value Range   Glucose-Capillary 91  70 - 99 (mg/dL)   Comment 1 Notify RN      Dg Chest 2 View  04/21/2011   *RADIOLOGY REPORT*  Clinical Data: Preoperative evaluation.  Hypertension.  Pacemaker. Diabetes.  Smoker.  CHEST - 2 VIEW  Comparison: 02/21/2010.  Findings: Multiple lead AICD is in place with controller device on the left.  There is stable slight enlargement of the cardiac silhouette. Ectasia and nonaneurysmal calcification of the thoracic aorta are seen.  Right upper lobe fibrotic densities appear stable. There is an overall hyperinflation configuration.  No pulmonary edema, pneumonia, or pleural effusion is seen.  Anterior cervical fusion hardware is in place.  Moderate degenerative spondylosis changes are present.  IMPRESSION: AICD in place.  Stable cardiac silhouette enlargement.  Overall hyperinflation consistent with COPD.  Stable fibrotic changes in right upper lobe.  No acute cardiopulmonary or pleural abnormality is evident.  Stable chronic findings are detailed above.  Original Report Authenticated By: Crawford Givens, M.D.   Dg Lumbar Spine 2-3 Views  04/29/2011  *RADIOLOGY REPORT*  Clinical Data: L2-5 discectomy.  LUMBAR SPINE - 2-3 VIEW  Comparison: CT lumbar spine 12/01/2010.  Findings: We are provided with two intraoperative views of the lumbar spine in the lateral projection.  On film labeled #1, metallic probes are at the level of the L3 and L4 pedicles.  On film labeled #2, probes at the level of the L2 and L5 pedicles.  IMPRESSION: Localization as above.  Original Report Authenticated By: Bernadene Bell. Maricela Curet, M.D.   Ct Lumbar Spine Wo Contrast  05/02/2011  *RADIOLOGY REPORT*  Clinical Data: Back pain status post lumbar laminectomy 04/29/2011. History of diabetes and prostate cancer.  CT LUMBAR SPINE WITHOUT CONTRAST  Technique:  Multidetector CT imaging of the lumbar spine was performed without intravenous contrast administration. Multiplanar CT image reconstructions were also generated.  Comparison: Intraoperative radiographs of 04/29/2011.  Preoperative CT 12/01/2010.  Findings: Decompressive  laminectomies have been performed from L2- L4 in the interval.  There is expected postsurgical change in the posterior paraspinal soft tissues with ill-defined fluid.  Air tracking along the posterior aspect of the dura in the laminectomy bed is probably due to Gelfoam.  The overall alignment is stable.  There is a scoliosis convex to the right and L1-L2 and to the left at L4-L5.  There is leftward listhesis of L4 on L5.  Vacuum phenomenon is present throughout all of the lumbar disc.  No acute anterior paraspinal abnormalities are identified.  There are diffuse vascular calcifications, moderate distension of the urinary bladder and probable simple left renal cyst.  L1-L2:  Stable annular disc bulging with disc desiccation and calcification.  There is resulting mild to moderate central and biforaminal stenosis which appears unchanged.  L2-L3:  Interval laminectomy with good decompression of the spinal canal.  Asymmetric disc bulging and facet hypertrophy on the left contribute to mild residual left foraminal and left lateral recess stenosis.  The right foramen appears widely patent.  L3-L4:  Interval laminectomy with good decompression of the spinal canal. There is moderate biforaminal stenosis due to calcified disc material, not significantly changed.  L4-L5:  Spinal canal appears adequately decompressed by the interval laminectomies.  There is advanced facet hypertrophy on the right with asymmetric disc bulging and osteophytes contributing to persistent severe right  foraminal stenosis.  The left foramen demonstrates stable mild narrowing.  L5-S1:  Stable annular disc bulging, facet and ligamentous hypertrophy contributing to mild central and biforaminal stenosis.  IMPRESSION:  1.  Interval L2-L4 laminectomy without demonstrated complication. There is presumed Gelfoam within the laminectomy bed.  No suspicious fluid collection is identified. 2.  Stable alignment.  No evidence of acute fracture. 3.  The spinal canal  is adequately decompressed at the operative levels.  There is residual foraminal narrowing as detailed above. 4.  No definite acute findings.  Original Report Authenticated By: Gerrianne Scale, M.D.    Antibiotics:  Anti-infectives     Start     Dose/Rate Route Frequency Ordered Stop   04/29/11 2200   ceFAZolin (ANCEF) IVPB 1 g/50 mL premix        1 g 100 mL/hr over 30 Minutes Intravenous 3 times per day 04/29/11 1816 04/30/11 0637   04/29/11 1313   bacitracin 50,000 Units in sodium chloride irrigation 0.9 % 500 mL irrigation  Status:  Discontinued          As needed 04/29/11 1313 04/29/11 1448   04/29/11 1155   bacitracin 16109 UNITS injection     Comments: Reece Packer: cabinet override         04/29/11 1155 04/29/11 2359   04/28/11 1441   ceFAZolin (ANCEF) IVPB 2 g/50 mL premix        2 g 100 mL/hr over 30 Minutes Intravenous 30 min pre-op 04/28/11 1441 04/29/11 1235          Discharge Exam: Blood pressure 124/66, pulse 73, temperature 98.6 F (37 C), temperature source Oral, resp. rate 18, SpO2 96.00%. Neurologic: Grossly normal  Discharge Medications:   Current Discharge Medication List    CONTINUE these medications which have NOT CHANGED   Details  acetaminophen (TYLENOL) 500 MG tablet Take 500 mg by mouth every 6 (six) hours as needed.      albuterol (PROAIR HFA) 108 (90 BASE) MCG/ACT inhaler Inhale 2 puffs into the lungs every 6 (six) hours as needed for wheezing. Qty: 1 Inhaler, Refills: 0    aspirin buffered (ADPRIN B) 325 MG TABS tablet Take 325 mg by mouth daily.      atorvastatin (LIPITOR) 20 MG tablet Take 1 tablet (20 mg total) by mouth daily. Qty: 90 tablet, Refills: 3    carvedilol (COREG) 6.25 MG tablet Take 1 tablet (6.25 mg total) by mouth 2 (two) times daily with a meal. Qty: 180 tablet, Refills: 3    Cholecalciferol (TH VITAMIN D3) 2000 UNITS CAPS Take by mouth daily.      colchicine 0.6 MG tablet 1 tab by mouth two times a day as needed  gout pain Qty: 90 tablet, Refills: 3   Associated Diagnoses: Type II or unspecified type diabetes mellitus without mention of complication, not stated as uncontrolled; Other and unspecified hyperlipidemia; Chronic airway obstruction, not elsewhere classified    Cyanocobalamin (VITAMIN B-12) 2500 MCG SUBL Place under the tongue daily.      dutasteride (AVODART) 0.5 MG capsule Take 1 capsule (0.5 mg total) by mouth daily. Qty: 90 capsule, Refills: 3   Associated Diagnoses: Type II or unspecified type diabetes mellitus without mention of complication, not stated as uncontrolled; Other and unspecified hyperlipidemia; Chronic airway obstruction, not elsewhere classified    eplerenone (INSPRA) 25 MG tablet Take 1/2 by mouth daily Qty: 45 tablet, Refills: 3    ferrous sulfate 325 (65 FE) MG tablet Take 1 tablet (  325 mg total) by mouth daily with breakfast. Qty: 30 tablet, Refills: 11    furosemide (LASIX) 40 MG tablet Take 1 tablet (40 mg total) by mouth daily. Take one tablet by mouth daily Qty: 90 tablet, Refills: 3    gabapentin (NEURONTIN) 300 MG capsule Take 1 capsule (300 mg total) by mouth at bedtime. Qty: 90 capsule, Refills: 3    glimepiride (AMARYL) 4 MG tablet Take 1 tablet (4 mg total) by mouth daily. Qty: 90 tablet, Refills: 3    glucose blood (ACCU-CHEK AVIVA) test strip Use 1 daily and as needed for diabetes Qty: 100 each, Refills: 11   Associated Diagnoses: Type II or unspecified type diabetes mellitus without mention of complication, not stated as uncontrolled; Other and unspecified hyperlipidemia; Chronic airway obstruction, not elsewhere classified    guaiFENesin (MUCINEX) 600 MG 12 hr tablet Take 1,200 mg by mouth 2 (two) times daily.      lisinopril (PRINIVIL,ZESTRIL) 10 MG tablet Take 1 tablet (10 mg total) by mouth daily. Qty: 90 tablet, Refills: 3    Misc. Devices [facility]) MISC by Does not apply route. Length of need= 99 years (perm) DX codes: 825.23, 824.0,  826.0, V45.02     pantoprazole (PROTONIX) 40 MG tablet Take 1 tablet (40 mg total) by mouth daily. Qty: 90 tablet, Refills: 3   Associated Diagnoses: Type II or unspecified type diabetes mellitus without mention of complication, not stated as uncontrolled; Other and unspecified hyperlipidemia; Chronic airway obstruction, not elsewhere classified    potassium chloride SA (K-DUR,KLOR-CON) 20 MEQ tablet Take 1 tablet (20 mEq total) by mouth daily. Qty: 90 tablet, Refills: 3    Tamsulosin HCl (FLOMAX) 0.4 MG CAPS Take 1 capsule (0.4 mg total) by mouth daily. Qty: 90 capsule, Refills: 3    tiotropium (SPIRIVA HANDIHALER) 18 MCG inhalation capsule Place 1 capsule (18 mcg total) into inhaler and inhale daily. 1 inhale every morning Qty: 90 capsule, Refills: 3   Associated Diagnoses: Type II or unspecified type diabetes mellitus without mention of complication, not stated as uncontrolled; Other and unspecified hyperlipidemia; Chronic airway obstruction, not elsewhere classified        Disposition: home with home health PT   Final Dx: Lumbar laminectomy for stenosis  Discharge Orders    Future Appointments: Provider: Department: Dept Phone: Center:   05/06/2011 11:45 AM Duke Salvia, MD [facility] [phone] [email or web address]     Future Orders Please Complete By Expires   Diet - low sodium heart healthy      Increase activity slowly      Driving Restrictions      Comments:   2 weeks   Lifting restrictions      Comments:   Less than 10 lbs   Call MD for:  temperature >100.4      Call MD for:  persistant nausea and vomiting      Call MD for:  severe uncontrolled pain      Call MD for:  difficulty breathing, headache or visual disturbances      Call MD for:  redness, tenderness, or signs of infection (pain, swelling, redness, odor or green/yellow discharge around incision site)         Follow-up Information    Follow up with Ho Parisi S, MD in 2 weeks.   Contact information:    [address] [place] [address] [phone]           Signed: Tia Alert 05/04/2011, 8:25 AM

## 2011-05-04 NOTE — Consult Note (Signed)
Physical Medicine and Rehabilitation Consult Reason for Consult: Lumbar radiculopathy Referring Phsyician: Dr. Marikay Alar [name] is an 78 y.o. male.   HPI: 78 year old right-handed male with chronic progressive low back pain radiating to the lower extremities. X-rays and imaging showed severe lumbar stenosis lumbar 2-3 3-4 4-5 with radiculopathy. Underwent decompressive lumbar laminectomy medial facetectomy and foraminotomies of lumbar 2-3, 3-4, 4-5 on January 16 per Dr. Marikay Alar. Currently ambulating 20 feet with moderate assistance using a rolling walker and postural cues. Minimal assistance for stand to sit and moderate assist for sit to stand. Occupational therapy evaluation pending. Physical medicine and rehabilitation was consulted at the request of physical therapy to consider inpatient rehabilitation services.  Review of Systems  Constitutional: Positive for malaise/fatigue.  Eyes: Negative for double vision.  Respiratory: Negative for cough and shortness of breath.   Cardiovascular: Negative for chest pain.  Gastrointestinal: Positive for constipation. Negative for nausea.  Genitourinary: Negative.   Musculoskeletal: Positive for back pain.  Skin: Negative.   Neurological: Negative for dizziness and headaches.  Psychiatric/Behavioral: Negative.    Past Medical History  Diagnosis Date  . COPD (chronic obstructive pulmonary disease)     GOLD II; Spirometry 07/10/2008 >FEV1 1.46 56% predicted, ratio of 66%  . Anemia     iron deficiency anemia with previous severe GI bleed   . Hypertension   . Hyperlipidemia   . Left bundle branch block   . Prostatic hypertrophy 09-11-97    Benign  . Obesity   . CHF (congestive heart failure)     secondary to nonischemic cardiomyopathy; ECHO 10/07 EF 20-25%, mod to severe MR; ECHO 1/10 EF 55-60%, mild MR; ECHO 2/11 55-65%, S/P [employer] BiV ICD with biventricular function now turned off due to diahragmatic simulation  . CAD (coronary  artery disease)     Mild, nonobstructive  . Atrial fibrillation     s/p AV node ablation; not on coumadin due to GIB  . Cardiac arrest - ventricular fibrillation 02/2008    Aborted, shocked by ICD  . Dyspnea     exertional  . Pneumonia     01/2011  . Diabetes mellitus     type 2 NIDDM x 5-6 yrs  . GERD (gastroesophageal reflux disease)   . Cancer     prostate  . Arthritis     back and knees  . 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 Inguinal hernia     left; asymptomatic   Past Surgical History  Procedure Date  . Lobectomy 01-21-98    lung  . Rotator cuff repair 1994 and 1995  . Knee surgery 1996    Left  . Cardiac catheterization 01/2006    Showed mild nonobstructive CAD  . Cardiac catheterization 11/05/2006    Right atrial pressure mean of 12, RV pressure 36/8, PA pressure 39/16 witha mean of 28, wedge pressure was 20. Fick cardiac output was 5 liters per minute, cardiac index was 2.4  . Esophagogastroduodenoscopy 09/1997    Prepylor ulcer, esoph ring, duod avm  . Colonoscopy w/ polypectomy 11/1997    Divertics, int hemms  . Mch gi bleed 02/07-02/12/2002  . Esophagogastroduodenoscopy 05/22/2002    Poss Barrett's   . Colonoscopy 06/26/2002    Multip (neg) divertics, int hemm  . Colonoscopy 07/10/2004    Poyps, divertics, int hemms  . Mch sob 10/11-10/18/2007    A fib, CHF  . Esophagogastroduodenoscopy 02/19/2006    HH No active bleeding  .  Ct head limited w/o cm 10/03/2006    No acute abnmlty  . Coronary angioplasty     Min abstrut zd severe LB dystn EF 25%  . Mch x 11/08-11/03/2006    Acute blood loss, anemia, sys HF, isch cardiomyopathy  . Hosp 8/14-8/23/2008    Acute on chronis CHF IIIB NOnisch Cardiomyop EF 20-25% Mod-Sev MR  . Colonoscopy 06/19/2008    2 polyps divertics int hemms (Dr Marina Goodell)  . Insert / replace / remove pacemaker 2007  . Eye surgery     cataract, bilateral  . Anterior cervical discectomy 02/2010  . Knee arthroscopy 1988   . Lumbar laminectomy/decompression microdiscectomy 04/29/2011    Procedure: LUMBAR LAMINECTOMY/DECOMPRESSION MICRODISCECTOMY;  Surgeon: Tia Alert, MD;  Location: [facility];  Service: Neurosurgery;  Laterality: N/A;  Lumbar Two, Three, Four, Five Decompressive Lumbar Laminectomies   Family History  Problem Relation Age of Onset  . Heart failure Mother     CHF  . Heart failure Father     CHF  . Hypertension Sister   . Obesity Sister   . Cancer Brother     lung cancer  . Lung cancer Brother   . Liver disease Brother     ETOH  . Alcohol abuse Brother   . Cancer Brother   . COPD Brother   . Cancer Brother   . Anxiety disorder Brother   . COPD Brother   . Hypertension Sister    Social History:  reports that he has been smoking Cigarettes.  He has a 60 pack-year smoking history. He does not have any smokeless tobacco history on file. He reports that he does not drink alcohol or use illicit drugs. Allergies: No Known Allergies Medications Prior to Admission  Medication Dose Route Frequency Provider Last Rate Last Dose  . 0.9 %  sodium chloride infusion   Intravenous Continuous Tia Alert, MD 100 mL/hr at 05/03/11 1918    . acetaminophen (TYLENOL) tablet 650 mg  650 mg Oral Q4H PRN Tia Alert, MD       Or  . acetaminophen (TYLENOL) suppository 650 mg  650 mg Rectal Q4H PRN Tia Alert, MD      . albuterol (PROVENTIL HFA;VENTOLIN HFA) 108 (90 BASE) MCG/ACT inhaler 2 puff  2 puff Inhalation Q6H PRN Tia Alert, MD      . aspirin EC tablet 325 mg  325 mg Oral Daily Benny Lennert, PHARMD   325 mg at 05/03/11 1010  . bacitracin 29562 UNITS injection           . Boost Plus (BOOST Plus) liquid 237 mL  237 mL Oral TID WC Tia Alert, MD   237 mL at 05/03/11 1757  . carvedilol (COREG) tablet 6.25 mg  6.25 mg Oral BID WC Tia Alert, MD   6.25 mg at 05/03/11 1619  . ceFAZolin (ANCEF) IVPB 1 g/50 mL premix  1 g Intravenous Q8H Tia Alert, MD   1 g at 04/30/11 0607  .  ceFAZolin (ANCEF) IVPB 2 g/50 mL premix  2 g Intravenous 30 min Pre-Op Benny Lennert, PHARMD   2 g at 04/29/11 1235  . colchicine tablet 0.6 mg  0.6 mg Oral Daily Tia Alert, MD   0.6 mg at 05/03/11 1011  . dexamethasone (DECADRON) injection 4 mg  4 mg Intravenous Q6H Tia Alert, MD   4 mg at 05/03/11 0316  . dextrose (GLUTOSE) 40 % oral gel           .  dextrose 50 % solution           . dutasteride (AVODART) capsule 0.5 mg  0.5 mg Oral Daily Tia Alert, MD   0.5 mg at 05/03/11 1013  . ferrous sulfate tablet 325 mg  325 mg Oral Q breakfast Tia Alert, MD   325 mg at 05/03/11 0813  . furosemide (LASIX) tablet 40 mg  40 mg Oral Daily Tia Alert, MD   40 mg at 05/03/11 1012  . gabapentin (NEURONTIN) capsule 300 mg  300 mg Oral QHS Tia Alert, MD   300 mg at 05/03/11 2219  . glimepiride (AMARYL) tablet 4 mg  4 mg Oral Q breakfast Tia Alert, MD      . HYDROmorphone (DILAUDID) 1 MG/ML injection           . lisinopril (PRINIVIL,ZESTRIL) tablet 10 mg  10 mg Oral Daily Tia Alert, MD   10 mg at 05/03/11 1011  . menthol-cetylpyridinium (CEPACOL) lozenge 3 mg  1 lozenge Oral PRN Tia Alert, MD       Or  . phenol [facility]) mouth spray 1 spray  1 spray Mouth/Throat PRN Tia Alert, MD      . morphine 4 MG/ML injection 1-4 mg  1-4 mg Intravenous Q3H PRN Tia Alert, MD   4 mg at 05/01/11 0406  . ondansetron (ZOFRAN) injection 4 mg  4 mg Intravenous Q4H PRN Tia Alert, MD      . oxyCODONE-acetaminophen [facility]) 5-325 MG per tablet 1-2 tablet  1-2 tablet Oral Q4H PRN Tia Alert, MD   1 tablet at 05/03/11 1807  . pantoprazole (PROTONIX) EC tablet 40 mg  40 mg Oral Daily Tia Alert, MD   40 mg at 05/03/11 1017  . potassium chloride SA (K-DUR,KLOR-CON) CR tablet 20 mEq  20 mEq Oral Daily Tia Alert, MD   20 mEq at 05/03/11 1012  . senna (SENOKOT) tablet 8.6 mg  1 tablet Oral BID Tia Alert, MD   8.6 mg at 05/03/11 2219  . sodium chloride 0.9 % infusion            . spironolactone (ALDACTONE) tablet 25 mg  25 mg Oral Daily Tia Alert, MD   25 mg at 05/03/11 1011  . Tamsulosin HCl (FLOMAX) capsule 0.4 mg  0.4 mg Oral Daily Tia Alert, MD   0.4 mg at 05/03/11 1012  . tiotropium (SPIRIVA) inhalation capsule 18 mcg  18 mcg Inhalation Daily Tia Alert, MD   18 mcg at 05/03/11 1610  . DISCONTD: 0.9 %  sodium chloride infusion  250 mL Intravenous Continuous Tia Alert, MD      . DISCONTD: 0.9 % NaCl with KCl 20 mEq/ L  infusion   Intravenous Continuous Tia Alert, MD 75 mL/hr at 05/02/11 0411    . DISCONTD: aspirin buffered (BUFFERIN) tablet 325 mg  325 mg Oral Daily Tia Alert, MD      . DISCONTD: bacitracin 50,000 Units in sodium chloride irrigation 0.9 % 500 mL irrigation    PRN Tia Alert, MD      . DISCONTD: bupivacaine (MARCAINE) 0.25 % injection    PRN Tia Alert, MD   16 mL at 04/29/11 1427  . DISCONTD: dexamethasone (DECADRON) injection 10 mg  10 mg Intravenous Once Tia Alert, MD      . DISCONTD: dextrose 5 % and 0.9% NaCl 1,000 mL with potassium  chloride 10 mEq infusion   Intravenous Continuous Tia Alert, MD 75 mL/hr at 05/02/11 1821    . DISCONTD: droperidol (INAPSINE) injection 0.625 mg  0.625 mg Intravenous PRN Remonia Richter, MD      . DISCONTD: furosemide (LASIX) tablet 40 mg  40 mg Oral Daily Tia Alert, MD   40 mg at 05/01/11 1114  . DISCONTD: glimepiride (AMARYL) tablet 4 mg  4 mg Oral Daily Tia Alert, MD   4 mg at 05/01/11 1116  . DISCONTD: HYDROcodone-acetaminophen (NORCO) 5-325 MG per tablet 1-2 tablet  1-2 tablet Oral Q4H PRN Tia Alert, MD   2 tablet at 05/02/11 0815  . DISCONTD: HYDROmorphone (DILAUDID) injection 0.25-0.5 mg  0.25-0.5 mg Intravenous Q5 min PRN Remonia Richter, MD   0.5 mg at 04/29/11 1519  . DISCONTD: lisinopril (PRINIVIL,ZESTRIL) tablet 10 mg  10 mg Oral Daily Tia Alert, MD   10 mg at 05/01/11 1116  . DISCONTD: sodium chloride 0.9 % injection 3 mL  3 mL Intravenous  Q12H Tia Alert, MD   3 mL at 05/01/11 2214  . DISCONTD: sodium chloride 0.9 % injection 3 mL  3 mL Intravenous PRN Tia Alert, MD   3 mL at 04/30/11 1703  . DISCONTD: sodium chloride irrigation 0.9 %    PRN Tia Alert, MD   1,000 mL at 04/29/11 1313  . DISCONTD: Surgifoam 1 Gm with Thrombin 5,000 units (5 ml) topical solution    PRN Tia Alert, MD      . DISCONTD: Surgifoam 100 with Thrombin 20,000 units (20 ml) topical solution    PRN Tia Alert, MD       Medications Prior to Admission  Medication Sig Dispense Refill  . acetaminophen (TYLENOL) 500 MG tablet Take 500 mg by mouth every 6 (six) hours as needed.        Marland Kitchen aspirin buffered (ADPRIN B) 325 MG TABS tablet Take 325 mg by mouth daily.        . Cholecalciferol (TH VITAMIN D3) 2000 UNITS CAPS Take by mouth daily.        . Cyanocobalamin (VITAMIN B-12) 2500 MCG SUBL Place under the tongue daily.        Marland Kitchen guaiFENesin (MUCINEX) 600 MG 12 hr tablet Take 1,200 mg by mouth 2 (two) times daily.        . Misc. Devices [facility]) MISC by Does not apply route. Length of need= 99 years (perm) DX codes: 825.23, 824.0, 826.0, V45.02         Home: Home Living Lives With: Spouse Receives Help From: Friend(s) (daughter) Type of Home: House Home Layout: One level Home Access: Stairs to enter Entrance Stairs-Rails: Doctor, general practice of Steps: 2 in front, 3 in back with no rails Bathroom Shower/Tub: Health visitor: Standard (vanity beside) Home Adaptive Equipment: Shower chair with back;Walker - rolling [employer] forces technical officer)  Functional History: Prior Function Level of Independence: Independent with basic ADLs;Requires assistive device for independence;Independent with gait;Independent with transfers Driving: Yes Functional Status:  Mobility: Bed Mobility Bed Mobility: No Left Sidelying to Sit: 4: Min assist;HOB flat Sit to Supine: 3: Mod assist;HOB elevated (comment degrees) Sit to Supine -  Details (indicate cue type and reason): increased pain with transition to supine (unable to perform through sidelying despite education on precautions.)  Eventually positioned with relief in supine with pillow at knees Sit to Sidelying Right: 4: Min assist;HOB flat Sit to Sidelying Right  Details (indicate cue type and reason): Min A for safe log roll technique Transfers Transfers: Yes Sit to Stand: 3: Mod assist;From chair/3-in-1;With upper extremity assist Sit to Stand Details (indicate cue type and reason): Assist for stability into standing. VC for proper posture. pt with increased flexed posture upon standing and is unable to straighten Stand to Sit: 4: Min assist;With upper extremity assist;To chair/3-in-1 Stand to Sit Details: VC for hand placement. Pt able to control descent Ambulation/Gait Ambulation/Gait: Yes Ambulation/Gait Assistance: 3: Mod assist Ambulation/Gait Assistance Details (indicate cue type and reason): VC for safety with RW and postural cues throughout. Pt unable to achieve any upright posture and is completely flexed over RW. Mod assist for stability while walking with daughter on other side with min guard assist. Pt with difficulty taking steps, with extremely short step lengths. Ambulation Distance (Feet): 20 Feet Assistive device: Rolling walker Gait Pattern: Step-to pattern;Decreased stride length;Trunk flexed;Shuffle    ADL:    Cognition: Cognition Arousal/Alertness: Awake/alert Orientation Level: Oriented X4;Oriented to place;Oriented to time;Oriented to situation Cognition Arousal/Alertness: Awake/alert Overall Cognitive Status: Appears within functional limits for tasks assessed Orientation Level: Oriented X4;Oriented to place;Oriented to time;Oriented to situation  Blood pressure 124/66, pulse 73, temperature 98.6 F (37 C), temperature source Oral, resp. rate 18, SpO2 96.00%. Physical Exam  Constitutional: He is oriented to person, place, and time.  He appears well-developed.  HENT:  Head: Normocephalic.  Neck: Normal range of motion. Neck supple. No thyromegaly present.  Cardiovascular: Normal rate and regular rhythm.   Pulmonary/Chest: Breath sounds normal. He has no wheezes.  Abdominal: He exhibits no distension. There is no tenderness.  Neurological: He is alert and oriented to person, place, and time.  Skin:       Incision clean and dry  Psychiatric: He has a normal mood and affect.    Results for orders placed during the hospital encounter of 04/29/11 (from the past 24 hour(s))  GLUCOSE, CAPILLARY     Status: Abnormal   Collection Time   05/03/11  6:35 AM      Component Value Range   Glucose-Capillary 179 (*) 70 - 99 (mg/dL)   Comment 1 Documented in Chart     Comment 2 Notify RN    GLUCOSE, CAPILLARY     Status: Abnormal   Collection Time   05/03/11  8:00 AM      Component Value Range   Glucose-Capillary 167 (*) 70 - 99 (mg/dL)   Comment 1 Documented in Chart     Comment 2 Notify RN    GLUCOSE, CAPILLARY     Status: Abnormal   Collection Time   05/03/11 11:10 AM      Component Value Range   Glucose-Capillary 216 (*) 70 - 99 (mg/dL)  GLUCOSE, CAPILLARY     Status: Abnormal   Collection Time   05/03/11  4:39 PM      Component Value Range   Glucose-Capillary 130 (*) 70 - 99 (mg/dL)  GLUCOSE, CAPILLARY     Status: Abnormal   Collection Time   05/03/11 10:16 PM      Component Value Range   Glucose-Capillary 144 (*) 70 - 99 (mg/dL)   Comment 1 Notify RN     Ct Lumbar Spine Wo Contrast  05/02/2011  *RADIOLOGY REPORT*  Clinical Data: Back pain status post lumbar laminectomy 04/29/2011. History of diabetes and prostate cancer.  CT LUMBAR SPINE WITHOUT CONTRAST  Technique:  Multidetector CT imaging of the lumbar spine was performed without intravenous contrast  administration. Multiplanar CT image reconstructions were also generated.  Comparison: Intraoperative radiographs of 04/29/2011.  Preoperative CT 12/01/2010.   Findings: Decompressive laminectomies have been performed from L2- L4 in the interval.  There is expected postsurgical change in the posterior paraspinal soft tissues with ill-defined fluid.  Air tracking along the posterior aspect of the dura in the laminectomy bed is probably due to Gelfoam.  The overall alignment is stable.  There is a scoliosis convex to the right and L1-L2 and to the left at L4-L5.  There is leftward listhesis of L4 on L5.  Vacuum phenomenon is present throughout all of the lumbar disc.  No acute anterior paraspinal abnormalities are identified.  There are diffuse vascular calcifications, moderate distension of the urinary bladder and probable simple left renal cyst.  L1-L2:  Stable annular disc bulging with disc desiccation and calcification.  There is resulting mild to moderate central and biforaminal stenosis which appears unchanged.  L2-L3:  Interval laminectomy with good decompression of the spinal canal.  Asymmetric disc bulging and facet hypertrophy on the left contribute to mild residual left foraminal and left lateral recess stenosis.  The right foramen appears widely patent.  L3-L4:  Interval laminectomy with good decompression of the spinal canal. There is moderate biforaminal stenosis due to calcified disc material, not significantly changed.  L4-L5:  Spinal canal appears adequately decompressed by the interval laminectomies.  There is advanced facet hypertrophy on the right with asymmetric disc bulging and osteophytes contributing to persistent severe right foraminal stenosis.  The left foramen demonstrates stable mild narrowing.  L5-S1:  Stable annular disc bulging, facet and ligamentous hypertrophy contributing to mild central and biforaminal stenosis.  IMPRESSION:  1.  Interval L2-L4 laminectomy without demonstrated complication. There is presumed Gelfoam within the laminectomy bed.  No suspicious fluid collection is identified. 2.  Stable alignment.  No evidence of acute  fracture. 3.  The spinal canal is adequately decompressed at the operative levels.  There is residual foraminal narrowing as detailed above. 4.  No definite acute findings.  Original Report Authenticated By: Gerrianne Scale, M.D.    Assessment/Plan: Diagnosis: Lumbar stenosis  With radiculopathy.  Pt advancing nicely with therapy. Going home today.  Defer formal consult.  Ivory Broad, MD

## 2011-05-04 NOTE — Progress Notes (Signed)
Physical Therapy Pt declined ambulating or practicing safe stair technique before discharging  Home. Family was verbally educated on how to assist pt. On the steps and how to assist pt. In and out of the car. Daughter verbalized agreement.

## 2011-05-05 NOTE — Progress Notes (Signed)
CARE MANAGEMENT NOTE 05/05/2011  Received call from Patient experience that patient was discharged prior to Home Health being arranged. Spoke with patient's daughter, Monika Salk, choice offered, they have used [facility] in the past and choose to do so now. They do not need Rolling walker or 3in1, they already have them. Entered request for home health PT in [facility], also spoke with [facility] liason, Debbie. Contacted patient's daughter Sheralyn Boatman to inform her that Home Health has been set in place.

## 2011-05-06 ENCOUNTER — Encounter: Payer: Medicare Other | Admitting: Internal Medicine

## 2011-05-07 ENCOUNTER — Other Ambulatory Visit: Payer: Self-pay | Admitting: Family Medicine

## 2011-05-07 DIAGNOSIS — I10 Essential (primary) hypertension: Secondary | ICD-10-CM | POA: Diagnosis not present

## 2011-05-07 DIAGNOSIS — E119 Type 2 diabetes mellitus without complications: Secondary | ICD-10-CM | POA: Diagnosis not present

## 2011-05-07 DIAGNOSIS — IMO0001 Reserved for inherently not codable concepts without codable children: Secondary | ICD-10-CM | POA: Diagnosis not present

## 2011-05-07 DIAGNOSIS — R269 Unspecified abnormalities of gait and mobility: Secondary | ICD-10-CM | POA: Diagnosis not present

## 2011-05-07 DIAGNOSIS — Z4789 Encounter for other orthopedic aftercare: Secondary | ICD-10-CM | POA: Diagnosis not present

## 2011-05-07 DIAGNOSIS — J449 Chronic obstructive pulmonary disease, unspecified: Secondary | ICD-10-CM | POA: Diagnosis not present

## 2011-05-08 ENCOUNTER — Ambulatory Visit: Payer: Medicare Other | Admitting: Family Medicine

## 2011-05-11 DIAGNOSIS — Z4789 Encounter for other orthopedic aftercare: Secondary | ICD-10-CM | POA: Diagnosis not present

## 2011-05-11 DIAGNOSIS — E119 Type 2 diabetes mellitus without complications: Secondary | ICD-10-CM | POA: Diagnosis not present

## 2011-05-11 DIAGNOSIS — IMO0001 Reserved for inherently not codable concepts without codable children: Secondary | ICD-10-CM | POA: Diagnosis not present

## 2011-05-11 DIAGNOSIS — J449 Chronic obstructive pulmonary disease, unspecified: Secondary | ICD-10-CM | POA: Diagnosis not present

## 2011-05-11 DIAGNOSIS — R269 Unspecified abnormalities of gait and mobility: Secondary | ICD-10-CM | POA: Diagnosis not present

## 2011-05-11 DIAGNOSIS — I10 Essential (primary) hypertension: Secondary | ICD-10-CM | POA: Diagnosis not present

## 2011-05-13 DIAGNOSIS — E119 Type 2 diabetes mellitus without complications: Secondary | ICD-10-CM | POA: Diagnosis not present

## 2011-05-13 DIAGNOSIS — IMO0001 Reserved for inherently not codable concepts without codable children: Secondary | ICD-10-CM | POA: Diagnosis not present

## 2011-05-13 DIAGNOSIS — I10 Essential (primary) hypertension: Secondary | ICD-10-CM | POA: Diagnosis not present

## 2011-05-13 DIAGNOSIS — R269 Unspecified abnormalities of gait and mobility: Secondary | ICD-10-CM | POA: Diagnosis not present

## 2011-05-13 DIAGNOSIS — J449 Chronic obstructive pulmonary disease, unspecified: Secondary | ICD-10-CM | POA: Diagnosis not present

## 2011-05-13 DIAGNOSIS — Z4789 Encounter for other orthopedic aftercare: Secondary | ICD-10-CM | POA: Diagnosis not present

## 2011-05-14 DIAGNOSIS — J449 Chronic obstructive pulmonary disease, unspecified: Secondary | ICD-10-CM | POA: Diagnosis not present

## 2011-05-14 DIAGNOSIS — IMO0001 Reserved for inherently not codable concepts without codable children: Secondary | ICD-10-CM | POA: Diagnosis not present

## 2011-05-14 DIAGNOSIS — I10 Essential (primary) hypertension: Secondary | ICD-10-CM | POA: Diagnosis not present

## 2011-05-14 DIAGNOSIS — R269 Unspecified abnormalities of gait and mobility: Secondary | ICD-10-CM | POA: Diagnosis not present

## 2011-05-14 DIAGNOSIS — E119 Type 2 diabetes mellitus without complications: Secondary | ICD-10-CM | POA: Diagnosis not present

## 2011-05-14 DIAGNOSIS — Z4789 Encounter for other orthopedic aftercare: Secondary | ICD-10-CM | POA: Diagnosis not present

## 2011-05-15 DIAGNOSIS — Z4789 Encounter for other orthopedic aftercare: Secondary | ICD-10-CM | POA: Diagnosis not present

## 2011-05-15 DIAGNOSIS — E119 Type 2 diabetes mellitus without complications: Secondary | ICD-10-CM | POA: Diagnosis not present

## 2011-05-15 DIAGNOSIS — I10 Essential (primary) hypertension: Secondary | ICD-10-CM | POA: Diagnosis not present

## 2011-05-15 DIAGNOSIS — J449 Chronic obstructive pulmonary disease, unspecified: Secondary | ICD-10-CM | POA: Diagnosis not present

## 2011-05-15 DIAGNOSIS — IMO0001 Reserved for inherently not codable concepts without codable children: Secondary | ICD-10-CM | POA: Diagnosis not present

## 2011-05-15 DIAGNOSIS — R269 Unspecified abnormalities of gait and mobility: Secondary | ICD-10-CM | POA: Diagnosis not present

## 2011-05-18 ENCOUNTER — Encounter: Payer: Self-pay | Admitting: Internal Medicine

## 2011-05-18 ENCOUNTER — Encounter: Payer: Self-pay | Admitting: Physician Assistant

## 2011-05-18 ENCOUNTER — Ambulatory Visit (INDEPENDENT_AMBULATORY_CARE_PROVIDER_SITE_OTHER): Payer: Medicare Other | Admitting: *Deleted

## 2011-05-18 ENCOUNTER — Ambulatory Visit: Payer: Medicare Other | Admitting: Physician Assistant

## 2011-05-18 ENCOUNTER — Other Ambulatory Visit: Payer: Self-pay | Admitting: *Deleted

## 2011-05-18 ENCOUNTER — Ambulatory Visit (INDEPENDENT_AMBULATORY_CARE_PROVIDER_SITE_OTHER): Payer: Medicare Other | Admitting: Physician Assistant

## 2011-05-18 VITALS — BP 98/68 | HR 70 | Ht 67.0 in | Wt 182.0 lb

## 2011-05-18 DIAGNOSIS — I428 Other cardiomyopathies: Secondary | ICD-10-CM

## 2011-05-18 DIAGNOSIS — I4891 Unspecified atrial fibrillation: Secondary | ICD-10-CM | POA: Diagnosis not present

## 2011-05-18 DIAGNOSIS — I420 Dilated cardiomyopathy: Secondary | ICD-10-CM

## 2011-05-18 DIAGNOSIS — I10 Essential (primary) hypertension: Secondary | ICD-10-CM | POA: Diagnosis not present

## 2011-05-18 DIAGNOSIS — E119 Type 2 diabetes mellitus without complications: Secondary | ICD-10-CM | POA: Diagnosis not present

## 2011-05-18 DIAGNOSIS — Z4789 Encounter for other orthopedic aftercare: Secondary | ICD-10-CM | POA: Diagnosis not present

## 2011-05-18 DIAGNOSIS — I509 Heart failure, unspecified: Secondary | ICD-10-CM

## 2011-05-18 DIAGNOSIS — Z95810 Presence of automatic (implantable) cardiac defibrillator: Secondary | ICD-10-CM | POA: Insufficient documentation

## 2011-05-18 DIAGNOSIS — IMO0001 Reserved for inherently not codable concepts without codable children: Secondary | ICD-10-CM | POA: Diagnosis not present

## 2011-05-18 DIAGNOSIS — F17200 Nicotine dependence, unspecified, uncomplicated: Secondary | ICD-10-CM

## 2011-05-18 DIAGNOSIS — J449 Chronic obstructive pulmonary disease, unspecified: Secondary | ICD-10-CM | POA: Diagnosis not present

## 2011-05-18 DIAGNOSIS — R269 Unspecified abnormalities of gait and mobility: Secondary | ICD-10-CM | POA: Diagnosis not present

## 2011-05-18 LAB — ICD DEVICE OBSERVATION
AL IMPEDENCE ICD: 475 Ohm
LV LEAD IMPEDENCE ICD: 532 Ohm
RV LEAD IMPEDENCE ICD: 646 Ohm
TZAT-0001ATACH: 1
TZAT-0001ATACH: 2
TZAT-0001ATACH: 3
TZAT-0001FASTVT: 1
TZAT-0001SLOWVT: 1
TZAT-0001SLOWVT: 2
TZAT-0002ATACH: NEGATIVE
TZAT-0002ATACH: NEGATIVE
TZAT-0002FASTVT: NEGATIVE
TZAT-0004SLOWVT: 8
TZAT-0005SLOWVT: 88 pct
TZAT-0005SLOWVT: 91 pct
TZAT-0012ATACH: 150 ms
TZAT-0012ATACH: 150 ms
TZAT-0012SLOWVT: 200 ms
TZAT-0012SLOWVT: 200 ms
TZAT-0013SLOWVT: 2
TZAT-0013SLOWVT: 2
TZAT-0018ATACH: NEGATIVE
TZAT-0018ATACH: NEGATIVE
TZAT-0018ATACH: NEGATIVE
TZAT-0018SLOWVT: NEGATIVE
TZAT-0018SLOWVT: NEGATIVE
TZAT-0019ATACH: 6 V
TZON-0003ATACH: 350 ms
TZON-0003SLOWVT: 350 ms
TZON-0004SLOWVT: 28
TZON-0005SLOWVT: 12
TZST-0001ATACH: 4
TZST-0001ATACH: 5
TZST-0001ATACH: 6
TZST-0001FASTVT: 2
TZST-0001FASTVT: 3
TZST-0001FASTVT: 4
TZST-0001SLOWVT: 3
TZST-0001SLOWVT: 4
TZST-0001SLOWVT: 6
TZST-0002ATACH: NEGATIVE
TZST-0002ATACH: NEGATIVE
TZST-0002FASTVT: NEGATIVE
TZST-0002FASTVT: NEGATIVE
TZST-0003SLOWVT: 35 J
TZST-0003SLOWVT: 35 J

## 2011-05-18 LAB — BASIC METABOLIC PANEL
BUN: 18 mg/dL (ref 6–23)
CO2: 29 mEq/L (ref 19–32)
Calcium: 9 mg/dL (ref 8.4–10.5)
Chloride: 103 mEq/L (ref 96–112)
Creatinine, Ser: 0.9 mg/dL (ref 0.4–1.5)

## 2011-05-18 MED ORDER — LISINOPRIL 5 MG PO TABS: 5.0000 mg | ORAL_TABLET | Freq: Every day | ORAL | Status: DC

## 2011-05-18 NOTE — Assessment & Plan Note (Signed)
Overall stable.  He did take extra Lasix recently due to lower extremity edema.  He apparently missed some of his Lasix when he was in the hospital.  Check a basic metabolic panel today.  His blood pressure is somewhat low.  He notes weakness.  Decrease lisinopril to 5 mg daily.  Followup with Dr. Gala Romney in 3 months in the congestive heart failure clinic.

## 2011-05-18 NOTE — Assessment & Plan Note (Signed)
He is not on Coumadin due to a history of GI bleeding.

## 2011-05-18 NOTE — Progress Notes (Signed)
[address] [place], [place]  [address] Phone: [phone] Fax:  [phone]  Date:  05/18/2011   Name:  [name]       DOB:  11/24/1930 MRN:  [id number]  PCP:  Dr. Ermalene Searing  Primary Cardiologist:  Dr. Arvilla Meres  Primary Electrophysiologist:  Dr. Sherryl Manges    History of Present Illness: [name] is a 78 y.o. male who presents for post hospital follow up.  He has a a history of a congestive heart failure secondary to nonischemic cardiac myopathy with a recovered ejection fraction.  He also has h/o chronic atrial fib status post AV node ablation and BIV-ICD implantation (not on coumadin due to h/o GIB). Remainder of medical history is notable for COPD, diabetes, hypertension and hyperlipidemia.  In November 2010, he had syncopal episode and ICD showed VF with appropriate therapy.  Underwent ICD change out in Jan 2011 for ERI without problem.  Had echo Feb 2011 ef 55-65%.    He was admitted 1/16-1/21 with a lumbar laminectomy secondary to spinal stenosis.  There were no apparent complications.  He returns for routine followup.  After returning home, he had increased edema in his BLE and took extra lasix for a couple days.  He feels better now.  Working with HHPT.   Denies significant dyspnea.  No chest pain.  No syncope.  No orthopnea, PND.    Past Medical History  Diagnosis Date  . COPD (chronic obstructive pulmonary disease)     GOLD II; Spirometry 07/10/2008 >FEV1 1.46 56% predicted, ratio of 66%  . Anemia     iron deficiency anemia with previous severe GI bleed   . Hypertension   . Hyperlipidemia   . Left bundle branch block   . Prostatic hypertrophy 09-11-97    Benign  . Obesity   . CHF (congestive heart failure)     secondary to nonischemic cardiomyopathy; ECHO 10/07 EF 20-25%, mod to severe MR; ECHO 1/10 EF 55-60%, mild MR; ECHO 2/11 55-65%, S/P Medtronic BiV ICD with biventricular function now turned off due to diahragmatic  simulation  . CAD (coronary artery disease)     Mild, nonobstructive  . Atrial fibrillation     s/p AV node ablation; not on coumadin due to GIB  . Cardiac arrest - ventricular fibrillation 02/2008    Aborted, shocked by ICD  . Dyspnea     exertional  . Pneumonia     01/2011  . Diabetes mellitus     type 2 NIDDM x 5-6 yrs  . GERD (gastroesophageal reflux disease)   . Cancer     prostate  . Arthritis     back and knees  . 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 Inguinal hernia     left; asymptomatic    Current Outpatient Prescriptions  Medication Sig Dispense Refill  . acetaminophen (TYLENOL) 500 MG tablet Take 500 mg by mouth every 6 (six) hours as needed.        Marland Kitchen albuterol (PROAIR HFA) 108 (90 BASE) MCG/ACT inhaler Inhale 2 puffs into the lungs every 6 (six) hours as needed for wheezing.  1 Inhaler  0  . aspirin buffered (ADPRIN B) 325 MG TABS tablet Take 325 mg by mouth daily.        Marland Kitchen atorvastatin (LIPITOR) 20 MG tablet Take 1 tablet (20 mg total) by mouth daily.  90 tablet  3  . carvedilol (COREG) 6.25 MG  tablet Take 1 tablet (6.25 mg total) by mouth 2 (two) times daily with a meal.  180 tablet  3  . Cholecalciferol (TH VITAMIN D3) 2000 UNITS CAPS Take by mouth daily.        . colchicine 0.6 MG tablet 1 tab by mouth two times a day as needed gout pain  90 tablet  3  . Cyanocobalamin (VITAMIN B-12) 2500 MCG SUBL Place under the tongue daily.        Marland Kitchen dutasteride (AVODART) 0.5 MG capsule Take 1 capsule (0.5 mg total) by mouth daily.  90 capsule  3  . eplerenone (INSPRA) 25 MG tablet Take 1/2 by mouth daily  45 tablet  3  . ferrous sulfate 325 (65 FE) MG tablet Take 1 tablet (325 mg total) by mouth daily with breakfast.  30 tablet  11  . furosemide (LASIX) 40 MG tablet TAKE 1 TABLET DAILY  90 tablet  3  . gabapentin (NEURONTIN) 300 MG capsule Take 1 capsule (300 mg total) by mouth at bedtime.  90 capsule  3  . glimepiride (AMARYL) 4 MG tablet TAKE 1 TABLET DAILY   90 tablet  3  . glucose blood (ACCU-CHEK AVIVA) test strip Use 1 daily and as needed for diabetes  100 each  11  . guaiFENesin (MUCINEX) 600 MG 12 hr tablet Take 1,200 mg by mouth 2 (two) times daily as needed.       Marland Kitchen KLOR-CON M20 20 MEQ tablet TAKE 1 TABLET TWICE A DAY  180 each  3  . lisinopril (PRINIVIL,ZESTRIL) 20 MG tablet TAKE 1/2 TABLET ONCE DAILY.  45 tablet  3  . Misc. Devices [facility]) MISC by Does not apply route. Length of need= 99 years (perm) DX codes: 825.23, 824.0, 826.0, V45.02       . oxyCODONE-acetaminophen (PERCOCET) 5-325 MG per tablet Take 1 tablet by mouth every 6 (six) hours as needed.       . pantoprazole (PROTONIX) 40 MG tablet Take 1 tablet (40 mg total) by mouth daily.  90 tablet  3  . Tamsulosin HCl (FLOMAX) 0.4 MG CAPS TAKE 1 CAPSULE DAILY  90 capsule  3  . tiotropium (SPIRIVA HANDIHALER) 18 MCG inhalation capsule Place 1 capsule (18 mcg total) into inhaler and inhale daily. 1 inhale every morning  90 capsule  3    Allergies: No Known Allergies  History  Substance Use Topics  . Smoking status: Current Everyday Smoker -- 1.0 packs/day for 60 years    Types: Cigarettes  . Smokeless tobacco: Not on file   Comment: Smoker since 30, quit for 2 years and started back in 03/2008  . Alcohol Use: No     ROS:  Please see the history of present illness.   Has a lot of indigestion.  Notes constipation.  Feels weak and thinks he feels better if BP is higher.  All other systems reviewed and negative.   PHYSICAL EXAM: VS:  BP 98/68  Pulse 70  Ht 5\' 7"  (1.702 m)  Wt 182 lb (82.555 kg)  BMI 28.51 kg/m2 Well nourished, well developed, in no acute distress HEENT: normal Neck: no JVD at 90 degrees Cardiac:  normal S1, S2; RRR; no murmur Lungs:  clear to auscultation bilaterally, no wheezing, rhonchi or rales Abd: soft, nontender, no hepatomegaly Ext: no edema Skin: warm and dry Neuro:  CNs 2-12 intact, no focal abnormalities noted  EKG:  V. Paced, heart rate  70  ASSESSMENT AND PLAN:

## 2011-05-18 NOTE — Assessment & Plan Note (Signed)
He knows that he needs to quit. ?

## 2011-05-18 NOTE — Assessment & Plan Note (Signed)
Device interrogated today.  It is functioning appropriately.  His Optivol was up somewhat and his activity index was down somewhat.  This did corresponded with his recent admission for back surgery.  As noted, he took extra Lasix.  His Optivol is now down.  Follow up with Dr. Graciela Husbands in 6 months.

## 2011-05-18 NOTE — Progress Notes (Signed)
Pt seen in clinic for follow up of ICD.  No complaints of chest pain, shortness of breath, dizziness, palpitations, or shocks.  Device functioning normally at this time.  For full details, see PaceArt report.  No programming changes made today.  Seen with Tereso Newcomer, PA  Gypsy Balsam, RN, BSN 05/18/2011 9:43 AM

## 2011-05-18 NOTE — Patient Instructions (Signed)
Your physician recommends that you return for lab work in: today.  Your physician has recommended you make the following change in your medication: Decrease Lisinopril to 5mg  1 tablet daily  Your physician recommends that you schedule a follow-up appointment in: 3 months with Dr Gala Romney and 6 months with Dr Graciela Husbands.

## 2011-05-19 ENCOUNTER — Telehealth: Payer: Self-pay | Admitting: *Deleted

## 2011-05-19 DIAGNOSIS — I10 Essential (primary) hypertension: Secondary | ICD-10-CM | POA: Diagnosis not present

## 2011-05-19 DIAGNOSIS — IMO0001 Reserved for inherently not codable concepts without codable children: Secondary | ICD-10-CM | POA: Diagnosis not present

## 2011-05-19 DIAGNOSIS — R269 Unspecified abnormalities of gait and mobility: Secondary | ICD-10-CM | POA: Diagnosis not present

## 2011-05-19 DIAGNOSIS — Z4789 Encounter for other orthopedic aftercare: Secondary | ICD-10-CM | POA: Diagnosis not present

## 2011-05-19 DIAGNOSIS — E119 Type 2 diabetes mellitus without complications: Secondary | ICD-10-CM | POA: Diagnosis not present

## 2011-05-19 DIAGNOSIS — J449 Chronic obstructive pulmonary disease, unspecified: Secondary | ICD-10-CM | POA: Diagnosis not present

## 2011-05-19 NOTE — Telephone Encounter (Signed)
Message copied by Tarri Fuller on Tue May 19, 2011  9:03 AM ------      Message from: [place], [place] T      Created: Mon May 18, 2011  3:54 PM       Please notify patient that the lab results are ok.      Tereso Newcomer, PA-C  3:54 PM 05/18/2011

## 2011-05-19 NOTE — Telephone Encounter (Signed)
lmom lab ok. [name]

## 2011-05-20 DIAGNOSIS — J449 Chronic obstructive pulmonary disease, unspecified: Secondary | ICD-10-CM | POA: Diagnosis not present

## 2011-05-20 DIAGNOSIS — Z4789 Encounter for other orthopedic aftercare: Secondary | ICD-10-CM | POA: Diagnosis not present

## 2011-05-20 DIAGNOSIS — R269 Unspecified abnormalities of gait and mobility: Secondary | ICD-10-CM | POA: Diagnosis not present

## 2011-05-20 DIAGNOSIS — I10 Essential (primary) hypertension: Secondary | ICD-10-CM | POA: Diagnosis not present

## 2011-05-20 DIAGNOSIS — E119 Type 2 diabetes mellitus without complications: Secondary | ICD-10-CM | POA: Diagnosis not present

## 2011-05-20 DIAGNOSIS — IMO0001 Reserved for inherently not codable concepts without codable children: Secondary | ICD-10-CM | POA: Diagnosis not present

## 2011-05-22 DIAGNOSIS — I10 Essential (primary) hypertension: Secondary | ICD-10-CM | POA: Diagnosis not present

## 2011-05-22 DIAGNOSIS — J449 Chronic obstructive pulmonary disease, unspecified: Secondary | ICD-10-CM | POA: Diagnosis not present

## 2011-05-22 DIAGNOSIS — Z4789 Encounter for other orthopedic aftercare: Secondary | ICD-10-CM | POA: Diagnosis not present

## 2011-05-22 DIAGNOSIS — E119 Type 2 diabetes mellitus without complications: Secondary | ICD-10-CM | POA: Diagnosis not present

## 2011-05-22 DIAGNOSIS — R269 Unspecified abnormalities of gait and mobility: Secondary | ICD-10-CM | POA: Diagnosis not present

## 2011-05-22 DIAGNOSIS — IMO0001 Reserved for inherently not codable concepts without codable children: Secondary | ICD-10-CM | POA: Diagnosis not present

## 2011-05-26 DIAGNOSIS — E119 Type 2 diabetes mellitus without complications: Secondary | ICD-10-CM | POA: Diagnosis not present

## 2011-05-26 DIAGNOSIS — I10 Essential (primary) hypertension: Secondary | ICD-10-CM | POA: Diagnosis not present

## 2011-05-26 DIAGNOSIS — IMO0001 Reserved for inherently not codable concepts without codable children: Secondary | ICD-10-CM | POA: Diagnosis not present

## 2011-05-26 DIAGNOSIS — R269 Unspecified abnormalities of gait and mobility: Secondary | ICD-10-CM | POA: Diagnosis not present

## 2011-05-26 DIAGNOSIS — J449 Chronic obstructive pulmonary disease, unspecified: Secondary | ICD-10-CM | POA: Diagnosis not present

## 2011-05-26 DIAGNOSIS — Z4789 Encounter for other orthopedic aftercare: Secondary | ICD-10-CM | POA: Diagnosis not present

## 2011-06-02 ENCOUNTER — Other Ambulatory Visit: Payer: Self-pay | Admitting: Dermatology

## 2011-06-02 DIAGNOSIS — L820 Inflamed seborrheic keratosis: Secondary | ICD-10-CM | POA: Diagnosis not present

## 2011-06-02 DIAGNOSIS — L570 Actinic keratosis: Secondary | ICD-10-CM | POA: Diagnosis not present

## 2011-06-02 DIAGNOSIS — D485 Neoplasm of uncertain behavior of skin: Secondary | ICD-10-CM | POA: Diagnosis not present

## 2011-06-02 DIAGNOSIS — D1801 Hemangioma of skin and subcutaneous tissue: Secondary | ICD-10-CM | POA: Diagnosis not present

## 2011-06-22 DIAGNOSIS — M779 Enthesopathy, unspecified: Secondary | ICD-10-CM | POA: Diagnosis not present

## 2011-06-22 DIAGNOSIS — M7150 Other bursitis, not elsewhere classified, unspecified site: Secondary | ICD-10-CM | POA: Diagnosis not present

## 2011-07-01 ENCOUNTER — Other Ambulatory Visit: Payer: Self-pay | Admitting: Neurological Surgery

## 2011-07-01 DIAGNOSIS — M545 Low back pain: Secondary | ICD-10-CM

## 2011-07-02 ENCOUNTER — Other Ambulatory Visit: Payer: Medicare Other

## 2011-07-02 ENCOUNTER — Other Ambulatory Visit: Payer: Self-pay | Admitting: Family Medicine

## 2011-07-03 ENCOUNTER — Ambulatory Visit
Admission: RE | Admit: 2011-07-03 | Discharge: 2011-07-03 | Disposition: A | Discharge status: Home / Self Care | Payer: Medicare Other | Source: Ambulatory Visit | Attending: Neurological Surgery | Admitting: Neurological Surgery

## 2011-07-03 VITALS — BP 135/78 | HR 72

## 2011-07-03 DIAGNOSIS — M5126 Other intervertebral disc displacement, lumbar region: Secondary | ICD-10-CM | POA: Diagnosis not present

## 2011-07-03 DIAGNOSIS — M4120 Other idiopathic scoliosis, site unspecified: Secondary | ICD-10-CM | POA: Diagnosis not present

## 2011-07-03 DIAGNOSIS — M545 Low back pain: Secondary | ICD-10-CM

## 2011-07-03 DIAGNOSIS — M5137 Other intervertebral disc degeneration, lumbosacral region: Secondary | ICD-10-CM | POA: Diagnosis not present

## 2011-07-03 MED ORDER — IOHEXOL 180 MG/ML  SOLN
15.0000 mL | Freq: Once | INTRAMUSCULAR | Status: AC | PRN
  Administered 2011-07-03: 15 mL via INTRAVENOUS

## 2011-07-03 MED ORDER — DIAZEPAM 5 MG PO TABS
5.0000 mg | ORAL_TABLET | Freq: Once | ORAL | Status: AC
  Administered 2011-07-03: 5 mg via ORAL

## 2011-07-03 MED ORDER — ONDANSETRON HCL 4 MG/2ML IJ SOLN: 4.0000 mg | Freq: Four times a day (QID) | INTRAMUSCULAR | Status: DC | PRN

## 2011-07-03 NOTE — Discharge Instructions (Signed)

## 2011-07-06 ENCOUNTER — Telehealth: Payer: Self-pay | Admitting: *Deleted

## 2011-07-06 NOTE — Telephone Encounter (Signed)
Received interaction notice for Inspera. Form in your IN box.

## 2011-07-08 ENCOUNTER — Telehealth: Payer: Self-pay

## 2011-07-08 NOTE — Telephone Encounter (Signed)
[name] with [facility] said Eplerenone 25 mg has drug to drug interaction with Klor con 20. This was faxed to Dr Ermalene Searing and signed but [name] said Dr Ermalene Searing needs to indicate if ok to fill prescription. Please call [facility] [phone] and use ref # [id number].

## 2011-07-09 ENCOUNTER — Telehealth: Payer: Self-pay

## 2011-07-09 NOTE — Telephone Encounter (Signed)
Have the pharmacy hold and not fill the eplerenone. This is extremely complicated patient with potential bad polypharmacy interactions.  I looked up again - I would like to hold until we can get direct PCP on this.

## 2011-07-09 NOTE — Telephone Encounter (Signed)
Pt walked in with prescription from Dr Jarvis Newcomer for Neurontin 300 mg #90 with instructions to take one tab three times a day. Pt wants three month supply instead of one month. I advised pt to call Dr Yetta Barre office since Dr Ermalene Searing had referred pt to Dr Yetta Barre and Dr Yetta Barre had increased med to three times a day. Pt said he would call Dr Yetta Barre office.

## 2011-07-09 NOTE — Telephone Encounter (Signed)
Chelsea with [facility] called with same info as listed 07/08/11 about drug to drug interaction except left different call back # please call [phone]. Same ref# as given on 07/08/11 also.

## 2011-07-09 NOTE — Telephone Encounter (Signed)
Pt placed on this med by Cardiology for HTN/heart failure.  I do see risk associated with potassium, but he needs potassium because on lasix.  We will have them fill eplerenone , but have pt hold potassium. Have him come in in 1 week for potassium check to see if low off potassium since on lasix.

## 2011-07-12 NOTE — Telephone Encounter (Signed)
Noted. Make sure to change in med list.

## 2011-07-13 NOTE — Telephone Encounter (Signed)
[facility] pharmacy advised and patient advised. [place] and will call when he gets home to schedule appt for labs

## 2011-07-23 ENCOUNTER — Other Ambulatory Visit: Payer: Self-pay | Admitting: Family Medicine

## 2011-07-28 NOTE — Telephone Encounter (Signed)
changed

## 2011-07-31 ENCOUNTER — Other Ambulatory Visit: Payer: Medicare Other

## 2011-08-03 DIAGNOSIS — M48061 Spinal stenosis, lumbar region without neurogenic claudication: Secondary | ICD-10-CM | POA: Diagnosis not present

## 2011-08-05 ENCOUNTER — Other Ambulatory Visit (INDEPENDENT_AMBULATORY_CARE_PROVIDER_SITE_OTHER): Payer: Medicare Other

## 2011-08-05 ENCOUNTER — Other Ambulatory Visit: Payer: Self-pay | Admitting: *Deleted

## 2011-08-05 DIAGNOSIS — E785 Hyperlipidemia, unspecified: Secondary | ICD-10-CM | POA: Diagnosis not present

## 2011-08-05 DIAGNOSIS — E119 Type 2 diabetes mellitus without complications: Secondary | ICD-10-CM | POA: Diagnosis not present

## 2011-08-05 LAB — COMPREHENSIVE METABOLIC PANEL
Albumin: 4.1 g/dL (ref 3.5–5.2)
CO2: 30 mEq/L (ref 19–32)
Calcium: 9.5 mg/dL (ref 8.4–10.5)
Chloride: 102 mEq/L (ref 96–112)
GFR: 74.93 mL/min (ref >60.00)
Glucose, Bld: 93 mg/dL (ref 70–99)
Potassium: 4.5 mEq/L (ref 3.5–5.1)
Sodium: 141 mEq/L (ref 135–145)
Total Protein: 6.7 g/dL (ref 6.0–8.3)

## 2011-08-05 LAB — HEMOGLOBIN A1C: Hgb A1c MFr Bld: 6.2 % (ref 4.6–6.5)

## 2011-08-05 LAB — LIPID PANEL: VLDL: 31.4 mg/dL (ref 0.0–40.0)

## 2011-08-05 MED ORDER — GABAPENTIN 300 MG PO CAPS: 300.0000 mg | ORAL_CAPSULE | Freq: Three times a day (TID) | ORAL | Status: DC

## 2011-08-06 ENCOUNTER — Other Ambulatory Visit: Payer: Self-pay | Admitting: *Deleted

## 2011-08-06 ENCOUNTER — Encounter: Payer: Self-pay | Admitting: *Deleted

## 2011-08-06 DIAGNOSIS — J449 Chronic obstructive pulmonary disease, unspecified: Secondary | ICD-10-CM

## 2011-08-06 DIAGNOSIS — E785 Hyperlipidemia, unspecified: Secondary | ICD-10-CM

## 2011-08-06 DIAGNOSIS — E119 Type 2 diabetes mellitus without complications: Secondary | ICD-10-CM

## 2011-08-06 MED ORDER — COLCHICINE 0.6 MG PO TABS: ORAL_TABLET | ORAL | Status: DC

## 2011-08-18 ENCOUNTER — Encounter (HOSPITAL_COMMUNITY): Payer: Self-pay

## 2011-08-18 ENCOUNTER — Ambulatory Visit (HOSPITAL_COMMUNITY)
Admission: RE | Admit: 2011-08-18 | Discharge: 2011-08-18 | Disposition: A | Discharge status: Home / Self Care | Payer: Medicare Other | Source: Ambulatory Visit | Attending: Internal Medicine | Admitting: Internal Medicine

## 2011-08-18 VITALS — BP 120/62 | HR 79 | Resp 18 | Ht 66.0 in | Wt 193.4 lb

## 2011-08-18 DIAGNOSIS — E119 Type 2 diabetes mellitus without complications: Secondary | ICD-10-CM | POA: Insufficient documentation

## 2011-08-18 DIAGNOSIS — I5032 Chronic diastolic (congestive) heart failure: Secondary | ICD-10-CM | POA: Insufficient documentation

## 2011-08-18 DIAGNOSIS — K219 Gastro-esophageal reflux disease without esophagitis: Secondary | ICD-10-CM | POA: Diagnosis not present

## 2011-08-18 DIAGNOSIS — Z8546 Personal history of malignant neoplasm of prostate: Secondary | ICD-10-CM | POA: Insufficient documentation

## 2011-08-18 DIAGNOSIS — I2510 Atherosclerotic heart disease of native coronary artery without angina pectoris: Secondary | ICD-10-CM | POA: Insufficient documentation

## 2011-08-18 DIAGNOSIS — M549 Dorsalgia, unspecified: Secondary | ICD-10-CM | POA: Insufficient documentation

## 2011-08-18 DIAGNOSIS — J449 Chronic obstructive pulmonary disease, unspecified: Secondary | ICD-10-CM | POA: Insufficient documentation

## 2011-08-18 DIAGNOSIS — I420 Dilated cardiomyopathy: Secondary | ICD-10-CM

## 2011-08-18 DIAGNOSIS — I10 Essential (primary) hypertension: Secondary | ICD-10-CM | POA: Insufficient documentation

## 2011-08-18 DIAGNOSIS — F17200 Nicotine dependence, unspecified, uncomplicated: Secondary | ICD-10-CM | POA: Insufficient documentation

## 2011-08-18 DIAGNOSIS — E669 Obesity, unspecified: Secondary | ICD-10-CM | POA: Diagnosis not present

## 2011-08-18 DIAGNOSIS — I4891 Unspecified atrial fibrillation: Secondary | ICD-10-CM | POA: Diagnosis not present

## 2011-08-18 DIAGNOSIS — M479 Spondylosis, unspecified: Secondary | ICD-10-CM | POA: Insufficient documentation

## 2011-08-18 DIAGNOSIS — M1710 Unilateral primary osteoarthritis, unspecified knee: Secondary | ICD-10-CM | POA: Diagnosis not present

## 2011-08-18 DIAGNOSIS — Z8674 Personal history of sudden cardiac arrest: Secondary | ICD-10-CM | POA: Diagnosis not present

## 2011-08-18 DIAGNOSIS — N400 Enlarged prostate without lower urinary tract symptoms: Secondary | ICD-10-CM | POA: Diagnosis not present

## 2011-08-18 DIAGNOSIS — I428 Other cardiomyopathies: Secondary | ICD-10-CM | POA: Insufficient documentation

## 2011-08-18 DIAGNOSIS — M542 Cervicalgia: Secondary | ICD-10-CM | POA: Diagnosis not present

## 2011-08-18 DIAGNOSIS — I447 Left bundle-branch block, unspecified: Secondary | ICD-10-CM | POA: Diagnosis not present

## 2011-08-18 DIAGNOSIS — J4489 Other specified chronic obstructive pulmonary disease: Secondary | ICD-10-CM | POA: Insufficient documentation

## 2011-08-18 DIAGNOSIS — Z7982 Long term (current) use of aspirin: Secondary | ICD-10-CM | POA: Insufficient documentation

## 2011-08-18 DIAGNOSIS — E785 Hyperlipidemia, unspecified: Secondary | ICD-10-CM | POA: Diagnosis not present

## 2011-08-18 DIAGNOSIS — I509 Heart failure, unspecified: Secondary | ICD-10-CM | POA: Diagnosis not present

## 2011-08-18 DIAGNOSIS — Z9889 Other specified postprocedural states: Secondary | ICD-10-CM | POA: Diagnosis not present

## 2011-08-18 NOTE — Patient Instructions (Addendum)
Will increase lasix 80 mg (2 tabs) for 2 to 3 days to help with increased fluid.  Follow up 3 months with an echo.  Your physician has requested that you have an echocardiogram. Echocardiography is a painless test that uses sound waves to create images of your heart. It provides your doctor with information about the size and shape of your heart and how well your heart's chambers and valves are working. This procedure takes approximately one hour. There are no restrictions for this procedure.

## 2011-08-18 NOTE — Progress Notes (Signed)
Patient ID: [name], male   DOB: [birth year], 78 y.o.   MRN: [id number]       PCP:  Dr. Ermalene Searing  Primary Cardiologist:  Dr. Arvilla Meres  Primary Electrophysiologist:  Dr. Sherryl Manges    History of Present Illness: [name] is a 78 y.o. male with a history of a congestive heart failure secondary to nonischemic cardiomyopathy (EF previously 25%) with a recovered ejection fraction (2/11 EF 55-65%).  He also has h/o chronic atrial fib status post AV node ablation and BIV-ICD implantation (not on coumadin due to h/o GIB). Remainder of medical history is notable for COPD, diabetes, hypertension and hyperlipidemia and severe back and neck pain s/p multiple surgeries. In November 2010, he had syncopal episode and ICD showed VF with appropriate therapy.  Underwent ICD change out in Jan 2011 for ERI without problem.  Had echo Feb 2011 ef 55-65%.    He was admitted 1/16-1/21 with a lumbar laminectomy secondary to spinal stenosis.  There were no apparent complications.  He saw Tereso Newcomer a couple of months and had mild volume overload. Lasix increased.   Over the past week weight up about 8 pounds. Mild edema. No orthopnea or PND. No CP. Still smoking a few cigarettes per day. Chronic dyspnea - unchanged.   Recent labs K 4.5 Cr 1.0  Lab Results  Component Value Date   CHOL 108 08/05/2011   HDL 33.50* 08/05/2011   LDLCALC 43 08/05/2011   TRIG 157.0* 08/05/2011   CHOLHDL 3 08/05/2011      Past Medical History  Diagnosis Date  . COPD (chronic obstructive pulmonary disease)     GOLD II; Spirometry 07/10/2008 >FEV1 1.46 56% predicted, ratio of 66%  . Anemia     iron deficiency anemia with previous severe GI bleed   . Hypertension   . Hyperlipidemia   . Left bundle branch block   . Prostatic hypertrophy 09-11-97    Benign  . Obesity   . CHF (congestive heart failure)     secondary to nonischemic cardiomyopathy; ECHO 10/07 EF 20-25%, mod to severe MR; ECHO 1/10 EF 55-60%, mild  MR; ECHO 2/11 55-65%, grade 1 diast dysfxn, miod to mod LAE, mild TR;    S/P Medtronic BiV ICD with biventricular function now turned off due to diahragmatic simulation  . CAD (coronary artery disease)     Mild, nonobstructive (LHC 1/07: mLAD 20%, pCFX 20-30%, mRCA 30%, EF 25%)  . Atrial fibrillation     s/p AV node ablation; not on coumadin due to GIB  . Cardiac arrest - ventricular fibrillation 02/2008    Aborted, shocked by ICD  . Dyspnea     exertional  . Pneumonia     01/2011  . Diabetes mellitus     type 2 NIDDM x 5-6 yrs  . GERD (gastroesophageal reflux disease)   . Cancer     prostate  . Arthritis     back and knees  . 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 Inguinal hernia     left; asymptomatic    Current Outpatient Prescriptions  Medication Sig Dispense Refill  . acetaminophen (TYLENOL) 500 MG tablet Take 500 mg by mouth every 6 (six) hours as needed.        Marland Kitchen albuterol (PROAIR HFA) 108 (90 BASE) MCG/ACT inhaler Inhale 2 puffs into the lungs every 6 (six) hours as needed for wheezing.  1 Inhaler  0  . aspirin buffered (  ADPRIN B) 325 MG TABS tablet Take 325 mg by mouth daily.        Marland Kitchen atorvastatin (LIPITOR) 20 MG tablet TAKE 1 TABLET DAILY AT     BEDTIME  90 tablet  3  . carvedilol (COREG) 6.25 MG tablet Take 1 tablet (6.25 mg total) by mouth 2 (two) times daily with a meal.  180 tablet  3  . Cholecalciferol (TH VITAMIN D3) 2000 UNITS CAPS Take by mouth daily.        . colchicine 0.6 MG tablet 1 tab by mouth two times a day as needed gout pain  90 tablet  3  . Cyanocobalamin (VITAMIN B-12) 2500 MCG SUBL Place under the tongue daily.        Marland Kitchen dutasteride (AVODART) 0.5 MG capsule Take 1 capsule (0.5 mg total) by mouth daily.  90 capsule  3  . eplerenone (INSPRA) 25 MG tablet TAKE 1 TABLET DAILY  90 tablet  3  . ferrous sulfate 325 (65 FE) MG tablet Take 1 tablet (325 mg total) by mouth daily with breakfast.  30 tablet  11  . furosemide (LASIX) 40 MG tablet  TAKE 1 TABLET DAILY  90 tablet  3  . gabapentin (NEURONTIN) 300 MG capsule Take 1 capsule (300 mg total) by mouth 3 (three) times daily.  270 capsule  1  . glimepiride (AMARYL) 4 MG tablet TAKE 1 TABLET DAILY  90 tablet  3  . glucose blood (ACCU-CHEK AVIVA) test strip Use 1 daily and as needed for diabetes  100 each  11  . guaiFENesin (MUCINEX) 600 MG 12 hr tablet Take 1,200 mg by mouth 2 (two) times daily as needed.       Marland Kitchen KLOR-CON M20 20 MEQ tablet TAKE 1 TABLET TWICE A DAY  180 each  3  . lisinopril (PRINIVIL,ZESTRIL) 5 MG tablet Take 1 tablet (5 mg total) by mouth daily.  90 tablet  3  . Misc. Devices [facility]) MISC by Does not apply route. Length of need= 99 years (perm) DX codes: 825.23, 824.0, 826.0, V45.02       . oxyCODONE-acetaminophen (PERCOCET) 5-325 MG per tablet Take 1 tablet by mouth every 6 (six) hours as needed.       . pantoprazole (PROTONIX) 40 MG tablet Take 1 tablet (40 mg total) by mouth daily.  90 tablet  3  . Tamsulosin HCl (FLOMAX) 0.4 MG CAPS TAKE 1 CAPSULE DAILY  90 capsule  3  . tiotropium (SPIRIVA HANDIHALER) 18 MCG inhalation capsule Place 1 capsule (18 mcg total) into inhaler and inhale daily. 1 inhale every morning  90 capsule  3    Allergies: No Known Allergies  History  Substance Use Topics  . Smoking status: Current Everyday Smoker -- 1.0 packs/day for 60 years    Types: Cigarettes  . Smokeless tobacco: Not on file   Comment: Smoker since 47, quit for 2 years and started back in 03/2008  . Alcohol Use: No     ROS:  Please see the history of present illness.   Has a lot of indigestion.  Notes constipation.  Feels weak and thinks he feels better if BP is higher.  All other systems reviewed and negative.   PHYSICAL EXAM: VS:  BP 120/62  Pulse 79  Resp 18  Ht 5\' 6"  (1.676 m)  Wt 193 lb 6.4 oz (87.726 kg)  BMI 31.22 kg/m2  SpO2 98% Well nourished, well developed, in no acute distress HEENT: normal Neck: JVP 9  Cardiac:  normal S1, S2; RRR; no  murmur Lungs:  clear to auscultation bilaterally, no wheezing, rhonchi or rales Abd: soft, nontender, no hepatomegaly Ext: 1+ edema bilaterally no clubbing/cyanosis Skin: warm and dry Neuro:  CNs 2-12 intact, no focal abnormalities noted    ASSESSMENT AND PLAN:

## 2011-08-20 ENCOUNTER — Ambulatory Visit (INDEPENDENT_AMBULATORY_CARE_PROVIDER_SITE_OTHER): Payer: Medicare Other | Admitting: *Deleted

## 2011-08-20 ENCOUNTER — Other Ambulatory Visit: Payer: Self-pay | Admitting: Family Medicine

## 2011-08-20 ENCOUNTER — Encounter: Payer: Self-pay | Admitting: Internal Medicine

## 2011-08-20 DIAGNOSIS — I5032 Chronic diastolic (congestive) heart failure: Secondary | ICD-10-CM

## 2011-08-20 DIAGNOSIS — I509 Heart failure, unspecified: Secondary | ICD-10-CM

## 2011-08-20 DIAGNOSIS — I4901 Ventricular fibrillation: Secondary | ICD-10-CM | POA: Diagnosis not present

## 2011-08-21 LAB — REMOTE ICD DEVICE
AL AMPLITUDE: 0.6 mv
BRDY-0004LV: 120 {beats}/min
CHARGE TIME: 10.189 s
FVT: 0
LV LEAD THRESHOLD: 1.75 V
RV LEAD IMPEDENCE ICD: 703 Ohm
TOT-0001: 1
TZAT-0001ATACH: 1
TZAT-0001ATACH: 2
TZAT-0001ATACH: 3
TZAT-0001SLOWVT: 1
TZAT-0001SLOWVT: 2
TZAT-0002FASTVT: NEGATIVE
TZAT-0004ATACH: 6
TZAT-0011ATACH: 10 ms
TZAT-0011ATACH: 10 ms
TZAT-0011SLOWVT: 10 ms
TZAT-0012ATACH: 150 ms
TZAT-0012ATACH: 150 ms
TZAT-0013SLOWVT: 2
TZAT-0018ATACH: NEGATIVE
TZAT-0018ATACH: NEGATIVE
TZAT-0018SLOWVT: NEGATIVE
TZAT-0018SLOWVT: NEGATIVE
TZAT-0019ATACH: 6 V
TZAT-0019FASTVT: 8 V
TZAT-0019SLOWVT: 8 V
TZAT-0019SLOWVT: 8 V
TZAT-0020ATACH: 1.5 ms
TZAT-0020ATACH: 1.5 ms
TZON-0005SLOWVT: 12
TZST-0001ATACH: 4
TZST-0001ATACH: 5
TZST-0001ATACH: 6
TZST-0001FASTVT: 3
TZST-0001FASTVT: 4
TZST-0001FASTVT: 5
TZST-0001SLOWVT: 3
TZST-0001SLOWVT: 4
TZST-0002ATACH: NEGATIVE
TZST-0002ATACH: NEGATIVE
TZST-0002FASTVT: NEGATIVE
TZST-0002FASTVT: NEGATIVE
TZST-0003SLOWVT: 35 J
TZST-0003SLOWVT: 35 J

## 2011-08-21 NOTE — Assessment & Plan Note (Signed)
Chronic. S/p AV node ablation. Refuses coumadin due to h/o life-threatening GI bleed. Continue ASA.

## 2011-08-21 NOTE — Assessment & Plan Note (Signed)
Counseled on need to quit.  

## 2011-08-21 NOTE — Assessment & Plan Note (Addendum)
EF has recovered. Volume status mildly elevated. Double lasix for 2 days. Reinforced need for daily weights and reviewed use of sliding scale diuretics. Will check repeat echo to make sure EF stable.

## 2011-08-31 ENCOUNTER — Encounter: Payer: Self-pay | Admitting: *Deleted

## 2011-09-04 NOTE — Progress Notes (Signed)
ICD remote with ICM 

## 2011-10-06 ENCOUNTER — Other Ambulatory Visit: Payer: Self-pay | Admitting: *Deleted

## 2011-10-06 DIAGNOSIS — E785 Hyperlipidemia, unspecified: Secondary | ICD-10-CM

## 2011-10-06 DIAGNOSIS — J449 Chronic obstructive pulmonary disease, unspecified: Secondary | ICD-10-CM

## 2011-10-06 DIAGNOSIS — E119 Type 2 diabetes mellitus without complications: Secondary | ICD-10-CM

## 2011-10-06 MED ORDER — COLCHICINE 0.6 MG PO TABS: 0.6000 mg | ORAL_TABLET | Freq: Two times a day (BID) | ORAL | Status: DC

## 2011-10-24 DIAGNOSIS — G8929 Other chronic pain: Secondary | ICD-10-CM | POA: Diagnosis not present

## 2011-10-24 DIAGNOSIS — I10 Essential (primary) hypertension: Secondary | ICD-10-CM | POA: Diagnosis not present

## 2011-10-24 DIAGNOSIS — S2249XA Multiple fractures of ribs, unspecified side, initial encounter for closed fracture: Secondary | ICD-10-CM | POA: Diagnosis not present

## 2011-10-24 DIAGNOSIS — S3981XA Other specified injuries of abdomen, initial encounter: Secondary | ICD-10-CM | POA: Diagnosis not present

## 2011-10-24 DIAGNOSIS — S62609A Fracture of unspecified phalanx of unspecified finger, initial encounter for closed fracture: Secondary | ICD-10-CM | POA: Diagnosis not present

## 2011-10-24 DIAGNOSIS — M542 Cervicalgia: Secondary | ICD-10-CM | POA: Diagnosis not present

## 2011-10-24 DIAGNOSIS — S298XXA Other specified injuries of thorax, initial encounter: Secondary | ICD-10-CM | POA: Diagnosis not present

## 2011-10-24 DIAGNOSIS — I509 Heart failure, unspecified: Secondary | ICD-10-CM | POA: Diagnosis not present

## 2011-10-24 DIAGNOSIS — S91009A Unspecified open wound, unspecified ankle, initial encounter: Secondary | ICD-10-CM | POA: Diagnosis not present

## 2011-10-24 DIAGNOSIS — S62309A Unspecified fracture of unspecified metacarpal bone, initial encounter for closed fracture: Secondary | ICD-10-CM | POA: Diagnosis not present

## 2011-10-24 DIAGNOSIS — F17200 Nicotine dependence, unspecified, uncomplicated: Secondary | ICD-10-CM | POA: Diagnosis not present

## 2011-10-24 DIAGNOSIS — S81009A Unspecified open wound, unspecified knee, initial encounter: Secondary | ICD-10-CM | POA: Diagnosis not present

## 2011-10-24 DIAGNOSIS — IMO0002 Reserved for concepts with insufficient information to code with codable children: Secondary | ICD-10-CM | POA: Diagnosis not present

## 2011-10-24 DIAGNOSIS — D509 Iron deficiency anemia, unspecified: Secondary | ICD-10-CM | POA: Diagnosis not present

## 2011-10-24 DIAGNOSIS — Z95810 Presence of automatic (implantable) cardiac defibrillator: Secondary | ICD-10-CM | POA: Diagnosis not present

## 2011-10-24 DIAGNOSIS — N400 Enlarged prostate without lower urinary tract symptoms: Secondary | ICD-10-CM | POA: Diagnosis not present

## 2011-10-24 DIAGNOSIS — S0990XA Unspecified injury of head, initial encounter: Secondary | ICD-10-CM | POA: Diagnosis not present

## 2011-10-24 DIAGNOSIS — D72829 Elevated white blood cell count, unspecified: Secondary | ICD-10-CM | POA: Diagnosis not present

## 2011-10-24 DIAGNOSIS — E785 Hyperlipidemia, unspecified: Secondary | ICD-10-CM | POA: Diagnosis not present

## 2011-10-24 DIAGNOSIS — S199XXA Unspecified injury of neck, initial encounter: Secondary | ICD-10-CM | POA: Diagnosis not present

## 2011-10-24 DIAGNOSIS — K769 Liver disease, unspecified: Secondary | ICD-10-CM | POA: Diagnosis not present

## 2011-10-24 DIAGNOSIS — Z85828 Personal history of other malignant neoplasm of skin: Secondary | ICD-10-CM | POA: Diagnosis not present

## 2011-10-24 DIAGNOSIS — M549 Dorsalgia, unspecified: Secondary | ICD-10-CM | POA: Diagnosis not present

## 2011-10-24 DIAGNOSIS — E119 Type 2 diabetes mellitus without complications: Secondary | ICD-10-CM | POA: Diagnosis not present

## 2011-10-24 DIAGNOSIS — J449 Chronic obstructive pulmonary disease, unspecified: Secondary | ICD-10-CM | POA: Diagnosis not present

## 2011-10-24 DIAGNOSIS — R0789 Other chest pain: Secondary | ICD-10-CM | POA: Diagnosis not present

## 2011-10-24 DIAGNOSIS — G8911 Acute pain due to trauma: Secondary | ICD-10-CM | POA: Diagnosis not present

## 2011-10-24 DIAGNOSIS — R51 Headache: Secondary | ICD-10-CM | POA: Diagnosis not present

## 2011-10-24 DIAGNOSIS — T148XXA Other injury of unspecified body region, initial encounter: Secondary | ICD-10-CM | POA: Diagnosis not present

## 2011-10-24 DIAGNOSIS — Z7982 Long term (current) use of aspirin: Secondary | ICD-10-CM | POA: Diagnosis not present

## 2011-10-24 DIAGNOSIS — S61209A Unspecified open wound of unspecified finger without damage to nail, initial encounter: Secondary | ICD-10-CM | POA: Diagnosis not present

## 2011-10-24 DIAGNOSIS — Z79899 Other long term (current) drug therapy: Secondary | ICD-10-CM | POA: Diagnosis not present

## 2011-10-24 DIAGNOSIS — S62233A Other displaced fracture of base of first metacarpal bone, unspecified hand, initial encounter for closed fracture: Secondary | ICD-10-CM | POA: Diagnosis not present

## 2011-10-24 DIAGNOSIS — S2239XA Fracture of one rib, unspecified side, initial encounter for closed fracture: Secondary | ICD-10-CM | POA: Diagnosis not present

## 2011-10-25 DIAGNOSIS — Z95810 Presence of automatic (implantable) cardiac defibrillator: Secondary | ICD-10-CM | POA: Diagnosis not present

## 2011-10-25 DIAGNOSIS — S2249XA Multiple fractures of ribs, unspecified side, initial encounter for closed fracture: Secondary | ICD-10-CM | POA: Diagnosis not present

## 2011-10-25 DIAGNOSIS — D509 Iron deficiency anemia, unspecified: Secondary | ICD-10-CM | POA: Diagnosis present

## 2011-10-25 DIAGNOSIS — S62309A Unspecified fracture of unspecified metacarpal bone, initial encounter for closed fracture: Secondary | ICD-10-CM | POA: Diagnosis not present

## 2011-10-25 DIAGNOSIS — S62233A Other displaced fracture of base of first metacarpal bone, unspecified hand, initial encounter for closed fracture: Secondary | ICD-10-CM | POA: Diagnosis present

## 2011-10-25 DIAGNOSIS — Z7982 Long term (current) use of aspirin: Secondary | ICD-10-CM | POA: Diagnosis not present

## 2011-10-25 DIAGNOSIS — R05 Cough: Secondary | ICD-10-CM | POA: Diagnosis not present

## 2011-10-25 DIAGNOSIS — IMO0002 Reserved for concepts with insufficient information to code with codable children: Secondary | ICD-10-CM | POA: Diagnosis not present

## 2011-10-25 DIAGNOSIS — S62609A Fracture of unspecified phalanx of unspecified finger, initial encounter for closed fracture: Secondary | ICD-10-CM | POA: Diagnosis not present

## 2011-10-25 DIAGNOSIS — G8929 Other chronic pain: Secondary | ICD-10-CM | POA: Diagnosis present

## 2011-10-25 DIAGNOSIS — D72829 Elevated white blood cell count, unspecified: Secondary | ICD-10-CM | POA: Diagnosis present

## 2011-10-25 DIAGNOSIS — F17200 Nicotine dependence, unspecified, uncomplicated: Secondary | ICD-10-CM | POA: Diagnosis present

## 2011-10-25 DIAGNOSIS — Z85828 Personal history of other malignant neoplasm of skin: Secondary | ICD-10-CM | POA: Diagnosis not present

## 2011-10-25 DIAGNOSIS — J4489 Other specified chronic obstructive pulmonary disease: Secondary | ICD-10-CM | POA: Diagnosis not present

## 2011-10-25 DIAGNOSIS — I10 Essential (primary) hypertension: Secondary | ICD-10-CM | POA: Diagnosis not present

## 2011-10-25 DIAGNOSIS — S2239XA Fracture of one rib, unspecified side, initial encounter for closed fracture: Secondary | ICD-10-CM | POA: Diagnosis not present

## 2011-10-25 DIAGNOSIS — M549 Dorsalgia, unspecified: Secondary | ICD-10-CM | POA: Diagnosis present

## 2011-10-25 DIAGNOSIS — E119 Type 2 diabetes mellitus without complications: Secondary | ICD-10-CM | POA: Diagnosis not present

## 2011-10-25 DIAGNOSIS — J449 Chronic obstructive pulmonary disease, unspecified: Secondary | ICD-10-CM | POA: Diagnosis not present

## 2011-10-25 DIAGNOSIS — I509 Heart failure, unspecified: Secondary | ICD-10-CM | POA: Diagnosis not present

## 2011-10-30 ENCOUNTER — Encounter: Payer: Medicare Other | Admitting: Internal Medicine

## 2011-10-30 DIAGNOSIS — S62609A Fracture of unspecified phalanx of unspecified finger, initial encounter for closed fracture: Secondary | ICD-10-CM | POA: Diagnosis not present

## 2011-10-30 DIAGNOSIS — Z09 Encounter for follow-up examination after completed treatment for conditions other than malignant neoplasm: Secondary | ICD-10-CM | POA: Diagnosis not present

## 2011-10-30 DIAGNOSIS — S2249XA Multiple fractures of ribs, unspecified side, initial encounter for closed fracture: Secondary | ICD-10-CM | POA: Diagnosis not present

## 2011-11-03 ENCOUNTER — Encounter: Payer: Medicare Other | Admitting: Internal Medicine

## 2011-11-05 ENCOUNTER — Ambulatory Visit (INDEPENDENT_AMBULATORY_CARE_PROVIDER_SITE_OTHER)
Admission: RE | Admit: 2011-11-05 | Discharge: 2011-11-05 | Disposition: A | Discharge status: Home / Self Care | Payer: Medicare Other | Source: Ambulatory Visit | Attending: Family Medicine | Admitting: Family Medicine

## 2011-11-05 ENCOUNTER — Ambulatory Visit (INDEPENDENT_AMBULATORY_CARE_PROVIDER_SITE_OTHER): Payer: Medicare Other | Admitting: Family Medicine

## 2011-11-05 ENCOUNTER — Encounter: Payer: Self-pay | Admitting: Family Medicine

## 2011-11-05 ENCOUNTER — Telehealth: Payer: Self-pay

## 2011-11-05 VITALS — BP 128/80 | HR 76 | Temp 97.9°F | Ht 66.0 in | Wt 196.0 lb

## 2011-11-05 DIAGNOSIS — J4489 Other specified chronic obstructive pulmonary disease: Secondary | ICD-10-CM

## 2011-11-05 DIAGNOSIS — Z4802 Encounter for removal of sutures: Secondary | ICD-10-CM | POA: Diagnosis not present

## 2011-11-05 DIAGNOSIS — R0600 Dyspnea, unspecified: Secondary | ICD-10-CM

## 2011-11-05 DIAGNOSIS — R062 Wheezing: Secondary | ICD-10-CM | POA: Diagnosis not present

## 2011-11-05 DIAGNOSIS — R0609 Other forms of dyspnea: Secondary | ICD-10-CM

## 2011-11-05 DIAGNOSIS — R609 Edema, unspecified: Secondary | ICD-10-CM | POA: Diagnosis not present

## 2011-11-05 DIAGNOSIS — Z950 Presence of cardiac pacemaker: Secondary | ICD-10-CM | POA: Diagnosis not present

## 2011-11-05 DIAGNOSIS — J449 Chronic obstructive pulmonary disease, unspecified: Secondary | ICD-10-CM | POA: Diagnosis not present

## 2011-11-05 DIAGNOSIS — M25469 Effusion, unspecified knee: Secondary | ICD-10-CM

## 2011-11-05 DIAGNOSIS — S62509A Fracture of unspecified phalanx of unspecified thumb, initial encounter for closed fracture: Secondary | ICD-10-CM | POA: Insufficient documentation

## 2011-11-05 DIAGNOSIS — M25461 Effusion, right knee: Secondary | ICD-10-CM | POA: Insufficient documentation

## 2011-11-05 DIAGNOSIS — R05 Cough: Secondary | ICD-10-CM | POA: Diagnosis not present

## 2011-11-05 DIAGNOSIS — S62609A Fracture of unspecified phalanx of unspecified finger, initial encounter for closed fracture: Secondary | ICD-10-CM

## 2011-11-05 NOTE — Assessment & Plan Note (Signed)
Unclear etiology, possible prepatellar cellulitis/bursitis.  Responding well to keflex, improving.  continue for now.  Await records.

## 2011-11-05 NOTE — Assessment & Plan Note (Signed)
With pleuritic pain.  Given dyspnea and coarse lung exam, checked CXR to r/o pneumonia - clear on my read. Dyspnea attributed mostly to recent rib fractures.  Does not meet criteria for COPD exacerbation. rec f/u next week with PCP. Requested records from hospital.

## 2011-11-05 NOTE — Telephone Encounter (Signed)
[place]; pt admitted to [facility] in [place]. Released from hospital around 10/26/11. Stitches in thumb and rt leg were to be removed in 5-7 days. Stitches have not been removed and rt leg stitches appear red, puffy and draining. Pt had fx thumb and ribs. Pt has developed productive cough with yellow green phlegm, hurts when coughs due to fx ribs. ? Fever. Pt has COPD; no more trouble breathing than pt usually has. Pt had loss of memory prior to accident but daughter said has worsened since accident. Pt has swelling in legs; daughter said pt to see cardiologist due to CHF. Dr Sharen Hones said 30 min appt this AM; scheduled 30 min at 11 am today.

## 2011-11-05 NOTE — Assessment & Plan Note (Signed)
Stitches removed. Again, requested records. Prescribed bandage but pt has not been using. Placed again today in thumb spica splint, rec regular use.  Will review records and see if needs further eval/ortho referral.

## 2011-11-05 NOTE — Assessment & Plan Note (Signed)
Sutures removed from R thumb and left anterior leg.  Pt tolerated well.  Wounds dressed and steri strips applied. Anticipate anterior leg erythema due to skin reaction to suture, possible concomitant cellulitis, however on keflex and should cover this. Asked pt to return next week for close f/u with PCP.

## 2011-11-05 NOTE — Assessment & Plan Note (Signed)
Possible mild CHF exacerbation, asked pt to increase lasix to 40mg  bid for 3 days.
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
## 2011-11-05 NOTE — Patient Instructions (Addendum)
Chest xray today - no evidence of infection or pneumonia. Right wrist brace today. Keep wounds clean and dry. Let strips fall off on their own. Return to see Dr. Ermalene Searing next week.

## 2011-11-09 ENCOUNTER — Encounter: Payer: Self-pay | Admitting: Internal Medicine

## 2011-11-09 ENCOUNTER — Ambulatory Visit (INDEPENDENT_AMBULATORY_CARE_PROVIDER_SITE_OTHER): Payer: Medicare Other | Admitting: *Deleted

## 2011-11-09 DIAGNOSIS — I4901 Ventricular fibrillation: Secondary | ICD-10-CM

## 2011-11-09 DIAGNOSIS — I5032 Chronic diastolic (congestive) heart failure: Secondary | ICD-10-CM

## 2011-11-09 LAB — ICD DEVICE OBSERVATION
AL IMPEDENCE ICD: 475 Ohm
ATRIAL PACING ICD: 0 pct
BATTERY VOLTAGE: 2.8866 V
LV LEAD IMPEDENCE ICD: 494 Ohm
PACEART VT: 0
RV LEAD THRESHOLD: 1 V
TOT-0002: 1
TOT-0006: 20110107000000
TZAT-0001ATACH: 1
TZAT-0001FASTVT: 1
TZAT-0001SLOWVT: 1
TZAT-0001SLOWVT: 2
TZAT-0002FASTVT: NEGATIVE
TZAT-0004ATACH: 15
TZAT-0004ATACH: 6
TZAT-0004SLOWVT: 8
TZAT-0011ATACH: 10 ms
TZAT-0011ATACH: 10 ms
TZAT-0012SLOWVT: 170 ms
TZAT-0012SLOWVT: 170 ms
TZAT-0013SLOWVT: 2
TZAT-0013SLOWVT: 2
TZAT-0018ATACH: NEGATIVE
TZAT-0018FASTVT: NEGATIVE
TZAT-0018SLOWVT: NEGATIVE
TZAT-0018SLOWVT: NEGATIVE
TZAT-0019ATACH: 6 V
TZAT-0019ATACH: 6 V
TZAT-0019SLOWVT: 8 V
TZAT-0019SLOWVT: 8 V
TZAT-0020ATACH: 1.5 ms
TZAT-0020SLOWVT: 1.5 ms
TZAT-0020SLOWVT: 1.5 ms
TZON-0003ATACH: 350 ms
TZON-0003SLOWVT: 350 ms
TZON-0003VSLOWVT: 450 ms
TZON-0004SLOWVT: 28
TZON-0005SLOWVT: 12
TZST-0001ATACH: 4
TZST-0001ATACH: 5
TZST-0001FASTVT: 2
TZST-0001FASTVT: 4
TZST-0001SLOWVT: 5
TZST-0002ATACH: NEGATIVE
TZST-0002FASTVT: NEGATIVE
TZST-0002FASTVT: NEGATIVE
TZST-0002FASTVT: NEGATIVE
TZST-0003SLOWVT: 35 J
TZST-0003SLOWVT: 35 J
VENTRICULAR PACING ICD: 98.52 pct
VF: 0

## 2011-11-09 NOTE — Progress Notes (Signed)
ICD check with ICM 

## 2011-11-12 ENCOUNTER — Ambulatory Visit (INDEPENDENT_AMBULATORY_CARE_PROVIDER_SITE_OTHER): Payer: Medicare Other | Admitting: Family Medicine

## 2011-11-12 ENCOUNTER — Encounter: Payer: Self-pay | Admitting: Family Medicine

## 2011-11-12 VITALS — BP 120/72 | HR 73 | Temp 97.8°F | Ht 66.0 in | Wt 190.0 lb

## 2011-11-12 DIAGNOSIS — M79604 Pain in right leg: Secondary | ICD-10-CM

## 2011-11-12 DIAGNOSIS — R0609 Other forms of dyspnea: Secondary | ICD-10-CM

## 2011-11-12 DIAGNOSIS — E785 Hyperlipidemia, unspecified: Secondary | ICD-10-CM

## 2011-11-12 DIAGNOSIS — G609 Hereditary and idiopathic neuropathy, unspecified: Secondary | ICD-10-CM | POA: Diagnosis not present

## 2011-11-12 DIAGNOSIS — J449 Chronic obstructive pulmonary disease, unspecified: Secondary | ICD-10-CM

## 2011-11-12 DIAGNOSIS — M79609 Pain in unspecified limb: Secondary | ICD-10-CM

## 2011-11-12 DIAGNOSIS — E119 Type 2 diabetes mellitus without complications: Secondary | ICD-10-CM

## 2011-11-12 DIAGNOSIS — R609 Edema, unspecified: Secondary | ICD-10-CM | POA: Diagnosis not present

## 2011-11-12 DIAGNOSIS — R0600 Dyspnea, unspecified: Secondary | ICD-10-CM

## 2011-11-12 LAB — BASIC METABOLIC PANEL
CO2: 28 mEq/L (ref 19–32)
Calcium: 9 mg/dL (ref 8.4–10.5)
Creatinine, Ser: 1.3 mg/dL (ref 0.4–1.5)
Glucose, Bld: 84 mg/dL (ref 70–99)

## 2011-11-12 MED ORDER — ATORVASTATIN CALCIUM 20 MG PO TABS: 20.0000 mg | ORAL_TABLET | Freq: Every day | ORAL | Status: DC

## 2011-11-12 MED ORDER — CARVEDILOL 6.25 MG PO TABS: 6.2500 mg | ORAL_TABLET | Freq: Two times a day (BID) | ORAL | Status: DC

## 2011-11-12 MED ORDER — PANTOPRAZOLE SODIUM 40 MG PO TBEC: 40.0000 mg | DELAYED_RELEASE_TABLET | Freq: Every day | ORAL | Status: DC

## 2011-11-12 MED ORDER — LISINOPRIL 5 MG PO TABS: 5.0000 mg | ORAL_TABLET | Freq: Every day | ORAL | Status: DC

## 2011-11-12 MED ORDER — TAMSULOSIN HCL 0.4 MG PO CAPS: 0.4000 mg | ORAL_CAPSULE | Freq: Every day | ORAL | Status: DC

## 2011-11-12 MED ORDER — TIOTROPIUM BROMIDE MONOHYDRATE 18 MCG IN CAPS: 18.0000 ug | ORAL_CAPSULE | Freq: Every day | RESPIRATORY_TRACT | Status: DC

## 2011-11-12 MED ORDER — GLIMEPIRIDE 4 MG PO TABS: 4.0000 mg | ORAL_TABLET | Freq: Every day | ORAL | Status: DC

## 2011-11-12 MED ORDER — EPLERENONE 25 MG PO TABS: 25.0000 mg | ORAL_TABLET | Freq: Every day | ORAL | Status: DC

## 2011-11-12 MED ORDER — GABAPENTIN 300 MG PO CAPS: 300.0000 mg | ORAL_CAPSULE | Freq: Three times a day (TID) | ORAL | Status: DC

## 2011-11-12 MED ORDER — POTASSIUM CHLORIDE CRYS ER 20 MEQ PO TBCR: EXTENDED_RELEASE_TABLET | ORAL | Status: DC

## 2011-11-12 MED ORDER — FERROUS SULFATE 325 (65 FE) MG PO TABS: 325.0000 mg | ORAL_TABLET | Freq: Every day | ORAL | Status: DC

## 2011-11-12 MED ORDER — FUROSEMIDE 40 MG PO TABS: 40.0000 mg | ORAL_TABLET | Freq: Every day | ORAL | Status: DC

## 2011-11-12 MED ORDER — DUTASTERIDE 0.5 MG PO CAPS: 0.5000 mg | ORAL_CAPSULE | Freq: Every day | ORAL | Status: DC

## 2011-11-12 MED ORDER — COLCHICINE 0.6 MG PO TABS: 0.6000 mg | ORAL_TABLET | Freq: Two times a day (BID) | ORAL | Status: DC

## 2011-11-12 NOTE — Patient Instructions (Addendum)
Elevate feet above heart when able. Increase to 600 mg neurontin at night, continue 300 mg twice during the day.  Increase lasix to 80 mg in AM and  40 mg at PM x 2-3 days. Take additional potassium tablet for total of 3 tabs a day. Follow daily weights, goal wts are between 182-186 lbs.   We will call with lab results.  Follow up in 1 week.
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
## 2011-11-26 ENCOUNTER — Encounter: Payer: Self-pay | Admitting: Family Medicine

## 2011-11-26 ENCOUNTER — Ambulatory Visit (INDEPENDENT_AMBULATORY_CARE_PROVIDER_SITE_OTHER): Payer: Medicare Other | Admitting: Family Medicine

## 2011-11-26 VITALS — BP 110/70 | HR 74 | Temp 97.4°F | Ht 66.0 in | Wt 186.0 lb

## 2011-11-26 DIAGNOSIS — R0600 Dyspnea, unspecified: Secondary | ICD-10-CM

## 2011-11-26 DIAGNOSIS — R0989 Other specified symptoms and signs involving the circulatory and respiratory systems: Secondary | ICD-10-CM | POA: Diagnosis not present

## 2011-11-26 DIAGNOSIS — R21 Rash and other nonspecific skin eruption: Secondary | ICD-10-CM | POA: Diagnosis not present

## 2011-11-26 DIAGNOSIS — R609 Edema, unspecified: Secondary | ICD-10-CM

## 2011-11-26 DIAGNOSIS — G609 Hereditary and idiopathic neuropathy, unspecified: Secondary | ICD-10-CM

## 2011-11-26 DIAGNOSIS — R0609 Other forms of dyspnea: Secondary | ICD-10-CM

## 2011-11-26 LAB — BASIC METABOLIC PANEL
BUN: 18 mg/dL (ref 6–23)
Chloride: 102 mEq/L (ref 96–112)
Creatinine, Ser: 1 mg/dL (ref 0.4–1.5)
GFR: 73.21 mL/min (ref >60.00)

## 2011-11-26 NOTE — Assessment & Plan Note (Signed)
Elevate feet above heart when able. Increase lasix to 80 mg in AM and  40 mg at PM x 2-3 days. Take additional potassium tablet for total of 3 tabs a day. Follow daily weights, goal wts are between 182-186 lbs.   We will call with lab results.  Follow up in 1 week.

## 2011-11-26 NOTE — Patient Instructions (Addendum)
Return to lasix 40 mg daily. Can increase to 80 mg daily for a few days  if weight trending back up and swelling returning.   We will call you with kidney lab result.

## 2011-11-26 NOTE — Assessment & Plan Note (Signed)
Lab Results  Component Value Date   HGBA1C 6.2 08/05/2011   Due for re-eval.

## 2011-11-26 NOTE — Assessment & Plan Note (Signed)
Likely due to peripheral edema and peripheral neuropathy.

## 2011-11-26 NOTE — Assessment & Plan Note (Addendum)
Red skin  at top of gluteal crease may be beginnings of decubitus ulcer.  USes [facility] at home... encourgaed him to shift weight , rotate body and walk as much as able to prevent progression of decubitus.

## 2011-11-26 NOTE — Progress Notes (Signed)
  Subjective:    Patient ID: [name], male    DOB: 12-07-1930, 78 y.o.   MRN: [id number]  HPI   78 year old male with complicated medical history and recetn hospitalization presents  For follow up peripheral edema, dyspnea. OV from last week reviewed for recent history.  PERIPHERAL NEUROPATHY -Increase to 600 mg neurontin at night, continue 300 mg twice during the day.   Peripheral edema - Elevate feet above heart when able.  Increase lasix to 80 mg in AM and 40 mg at PM x 2-3 days. Take additional potassium tablet for total of 3 tabs a day.  Follow daily weights, goal wts are between 182-186 lbs.   Labs showed creatinine up slightly at 1.3. Has returned to 40 mg day now per our recommendations after a 3 days increase to 80/40 Wt Readings from Last 3 Encounters:  11/26/11 186 lb (84.369 kg)  11/12/11 190 lb (86.183 kg)  11/05/11 196 lb (88.905 kg)   Weights are down from last week.   He reports swelling in much better peripherally now. Stable dyspnea  Pain in legs is better on higher dose of neurontin. No new SE.   Has skin lesion at gluteal crease present for 1 year.Marland Kitchen He just keeps forgetting to have me check area out.   Review of Systems  Constitutional: Positive for fatigue. Negative for fever.  HENT: Negative for congestion.   Eyes: Negative for pain.  Respiratory: Positive for shortness of breath. Negative for cough and wheezing.   Cardiovascular: Negative for chest pain, palpitations and leg swelling.  Gastrointestinal: Negative for abdominal pain.  Skin: Negative for rash.       Objective:   Physical Exam  Constitutional: He is oriented to person, place, and time.       Now walking with cane  HENT:  Head: Normocephalic.  Eyes: Conjunctivae are normal. Pupils are equal, round, and reactive to light.  Neck: Normal range of motion. Neck supple. No thyromegaly present.  Cardiovascular: Normal rate, regular rhythm and normal heart sounds.  Exam reveals no  gallop and no friction rub.   No murmur heard.      NO peripheral edema B ankles  B varicose veins.  Pulmonary/Chest: Effort normal and breath sounds normal. No respiratory distress. He has no wheezes. He has no rales. He exhibits no tenderness.  Abdominal: Soft. Bowel sounds are normal.  Neurological: He is alert and oriented to person, place, and time.  Skin: Skin is warm and dry. Rash: lesion.       Mild redness at upper gluteal crease, dry flaky skin, no lesion noted.  Psychiatric: He has a normal mood and affect.          Assessment & Plan:

## 2011-11-26 NOTE — Assessment & Plan Note (Signed)
No current pulmonary edema on exam.

## 2011-11-26 NOTE — Assessment & Plan Note (Addendum)
Increase to 600 mg neurontin at night, continue 300 mg twice during the day.

## 2011-11-26 NOTE — Assessment & Plan Note (Signed)
Multifactorial Improved with 120 mg lasix daily for three days, back now on 80 mg totoal daily. Will recheck creatinine today... He will return to longterm dose of lasix 40 mg daily with using extra 40 mg prn weight increase and fluid build up.

## 2011-11-26 NOTE — Assessment & Plan Note (Signed)
Stable

## 2011-12-02 ENCOUNTER — Encounter: Payer: Medicare Other | Admitting: Cardiology

## 2011-12-03 ENCOUNTER — Ambulatory Visit (INDEPENDENT_AMBULATORY_CARE_PROVIDER_SITE_OTHER): Payer: Medicare Other | Admitting: Family Medicine

## 2011-12-03 ENCOUNTER — Encounter: Payer: Self-pay | Admitting: Family Medicine

## 2011-12-03 VITALS — BP 118/64 | HR 79 | Temp 98.5°F | Ht 67.0 in | Wt 189.2 lb

## 2011-12-03 DIAGNOSIS — K219 Gastro-esophageal reflux disease without esophagitis: Secondary | ICD-10-CM

## 2011-12-03 MED ORDER — DEXLANSOPRAZOLE 60 MG PO CPDR: 60.0000 mg | DELAYED_RELEASE_CAPSULE | Freq: Every day | ORAL | Status: DC
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
## 2011-12-04 ENCOUNTER — Telehealth: Payer: Self-pay | Admitting: *Deleted

## 2011-12-04 ENCOUNTER — Ambulatory Visit: Payer: Medicare Other | Admitting: Family Medicine

## 2011-12-04 NOTE — Telephone Encounter (Signed)
[employer] faxed a form asking for clarification on dexilant script, saying that it was duplicate therapy with omeprazole.  Wrote note on form that omeprazole has been discontinued, faxed with your signature stamp and faxed form back to [employer].

## 2011-12-07 DIAGNOSIS — N401 Enlarged prostate with lower urinary tract symptoms: Secondary | ICD-10-CM | POA: Diagnosis not present

## 2011-12-07 DIAGNOSIS — C61 Malignant neoplasm of prostate: Secondary | ICD-10-CM | POA: Diagnosis not present

## 2012-02-09 ENCOUNTER — Encounter: Payer: Self-pay | Admitting: Internal Medicine

## 2012-02-09 ENCOUNTER — Ambulatory Visit (INDEPENDENT_AMBULATORY_CARE_PROVIDER_SITE_OTHER): Payer: Medicare Other | Admitting: Internal Medicine

## 2012-02-09 VITALS — BP 112/70 | HR 73 | Ht 66.0 in | Wt 193.6 lb

## 2012-02-09 DIAGNOSIS — I4901 Ventricular fibrillation: Secondary | ICD-10-CM

## 2012-02-09 DIAGNOSIS — Z95810 Presence of automatic (implantable) cardiac defibrillator: Secondary | ICD-10-CM

## 2012-02-09 DIAGNOSIS — I4891 Unspecified atrial fibrillation: Secondary | ICD-10-CM

## 2012-02-09 DIAGNOSIS — I5032 Chronic diastolic (congestive) heart failure: Secondary | ICD-10-CM

## 2012-02-09 LAB — ICD DEVICE OBSERVATION
AL IMPEDENCE ICD: 494 Ohm
ATRIAL PACING ICD: 0 pct
CHARGE TIME: 10.5 s
LV LEAD THRESHOLD: 1.625 V
PACEART VT: 0
TOT-0001: 1
TOT-0002: 1
TZAT-0001ATACH: 3
TZAT-0001FASTVT: 1
TZAT-0002ATACH: NEGATIVE
TZAT-0002FASTVT: NEGATIVE
TZAT-0004ATACH: 6
TZAT-0011ATACH: 10 ms
TZAT-0012ATACH: 150 ms
TZAT-0012ATACH: 150 ms
TZAT-0012FASTVT: 170 ms
TZAT-0012SLOWVT: 170 ms
TZAT-0018ATACH: NEGATIVE
TZAT-0018ATACH: NEGATIVE
TZAT-0018ATACH: NEGATIVE
TZAT-0018SLOWVT: NEGATIVE
TZAT-0018SLOWVT: NEGATIVE
TZAT-0019ATACH: 6 V
TZAT-0019ATACH: 6 V
TZAT-0019SLOWVT: 8 V
TZAT-0019SLOWVT: 8 V
TZAT-0020ATACH: 1.5 ms
TZAT-0020SLOWVT: 1.5 ms
TZAT-0020SLOWVT: 1.5 ms
TZON-0003VSLOWVT: 450 ms
TZST-0001ATACH: 4
TZST-0001ATACH: 5
TZST-0001ATACH: 6
TZST-0001FASTVT: 4
TZST-0001FASTVT: 5
TZST-0001SLOWVT: 5
TZST-0002ATACH: NEGATIVE
TZST-0002ATACH: NEGATIVE
TZST-0002FASTVT: NEGATIVE
TZST-0002FASTVT: NEGATIVE
TZST-0002FASTVT: NEGATIVE
TZST-0003SLOWVT: 35 J
TZST-0003SLOWVT: 35 J

## 2012-02-09 NOTE — Assessment & Plan Note (Signed)
No intercurrent ventricular arrhythmias 

## 2012-02-09 NOTE — Assessment & Plan Note (Signed)
The patient's device was interrogated.  The information was reviewed. No changes were made in the programming.    

## 2012-02-09 NOTE — Patient Instructions (Addendum)
Remote monitoring is used to monitor your Pacemaker of ICD from home. This monitoring reduces the number of office visits required to check your device to one time per year. It allows [place] to keep an eye on the functioning of your device to ensure it is working properly. You are scheduled for a device check from home on May 16, 2012. You may send your transmission at any time that day. If you have a wireless device, the transmission will be sent automatically. After your physician reviews your transmission, you will receive a postcard with your next transmission date.  Your physician wants you to follow-up in: 6 months with Dr. Graciela Husbands.  You will receive a reminder letter in the mail two months in advance. If you don't receive a letter, please call our office to schedule the follow-up appointment.
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
## 2012-02-09 NOTE — Assessment & Plan Note (Signed)
Permanent atrial fibrillation currently not anticoagulated we'll need to discuss this with Dr. Dorthea Cove. Presumably stopped because of GI bleeding. Will decrease his aspirin dose to 81 mg

## 2012-02-18 ENCOUNTER — Ambulatory Visit: Payer: Medicare Other | Admitting: Family Medicine

## 2012-02-18 ENCOUNTER — Ambulatory Visit (INDEPENDENT_AMBULATORY_CARE_PROVIDER_SITE_OTHER): Payer: Medicare Other | Admitting: Family Medicine

## 2012-02-18 ENCOUNTER — Encounter: Payer: Self-pay | Admitting: Family Medicine

## 2012-02-18 VITALS — BP 110/64 | HR 87 | Temp 97.9°F | Ht 66.0 in | Wt 193.2 lb

## 2012-02-18 DIAGNOSIS — Z23 Encounter for immunization: Secondary | ICD-10-CM

## 2012-02-18 DIAGNOSIS — R071 Chest pain on breathing: Secondary | ICD-10-CM

## 2012-02-18 DIAGNOSIS — R0789 Other chest pain: Secondary | ICD-10-CM

## 2012-02-18 NOTE — Assessment & Plan Note (Addendum)
Chest wall stretches, tyelenol, massage prn. Follow up if not imrpoving. Non cardiac in description.  Info given
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
## 2012-02-18 NOTE — Addendum Note (Signed)
Addended by: Consuello Masse on: 02/18/2012 04:30 PM   Modules accepted: Orders

## 2012-02-18 NOTE — Patient Instructions (Addendum)
Chest wall stretching exercises.  Can use heat if needed.  Give more time for muscle irritation to improve.

## 2012-02-19 ENCOUNTER — Other Ambulatory Visit: Payer: Self-pay | Admitting: Family Medicine

## 2012-02-19 ENCOUNTER — Encounter: Payer: Self-pay | Admitting: Family Medicine

## 2012-04-04 ENCOUNTER — Other Ambulatory Visit: Payer: Self-pay | Admitting: *Deleted

## 2012-04-04 MED ORDER — FUROSEMIDE 40 MG PO TABS: 40.0000 mg | ORAL_TABLET | Freq: Every day | ORAL | Status: DC

## 2012-04-07 ENCOUNTER — Encounter: Payer: Self-pay | Admitting: Internal Medicine

## 2012-04-07 ENCOUNTER — Ambulatory Visit (INDEPENDENT_AMBULATORY_CARE_PROVIDER_SITE_OTHER): Payer: Medicare Other | Admitting: Internal Medicine

## 2012-04-07 VITALS — BP 100/60 | HR 75 | Temp 98.4°F | Wt 195.0 lb

## 2012-04-07 DIAGNOSIS — J209 Acute bronchitis, unspecified: Secondary | ICD-10-CM | POA: Diagnosis not present

## 2012-04-07 NOTE — Progress Notes (Signed)
Subjective:    Patient ID: [name], male    DOB: 11/30/1930, 78 y.o.   MRN: [id number]  HPI Here with wife who is also sick Has been coughing and had congestion in nose Trying saline spray which helps his nose a little Cough---now with some mucus after trying mucinex Started last week after doing a lot of outside work  No ear pain Same SOB as usual for him No fever (may have had slight fever at the start of this a week ago)  Still a smoker Encouraged him to stop again Stopped in past but gained weight so he restarted  Current Outpatient Prescriptions on File Prior to Visit  Medication Sig Dispense Refill  . ACCU-CHEK AVIVA PLUS test strip USE AND DISCARD 1 TEST     STRIP DAILY AND AS NEEDED  FOR DIABETES  100 strip  3  . acetaminophen (TYLENOL) 500 MG tablet Take 500 mg by mouth every 6 (six) hours as needed.        Marland Kitchen aspirin buffered (ADPRIN B) 325 MG TABS tablet Take 325 mg by mouth daily.        Marland Kitchen atorvastatin (LIPITOR) 20 MG tablet Take 1 tablet (20 mg total) by mouth daily.  90 tablet  3  . carvedilol (COREG) 6.25 MG tablet Take 1 tablet (6.25 mg total) by mouth 2 (two) times daily with a meal.  180 tablet  3  . Cholecalciferol (TH VITAMIN D3) 2000 UNITS CAPS Take by mouth daily.        . colchicine 0.6 MG tablet Take 1 tablet (0.6 mg total) by mouth 2 (two) times daily.  180 tablet  3  . Cyanocobalamin (VITAMIN B-12) 2500 MCG SUBL Place under the tongue daily.        Marland Kitchen dexlansoprazole (DEXILANT) 60 MG capsule Take 60 mg by mouth daily.      Marland Kitchen dutasteride (AVODART) 0.5 MG capsule Take 1 capsule (0.5 mg total) by mouth daily.  90 capsule  3  . eplerenone (INSPRA) 25 MG tablet Take 1 tablet (25 mg total) by mouth daily.  90 tablet  3  . ferrous sulfate 325 (65 FE) MG tablet Take 1 tablet (325 mg total) by mouth daily with breakfast.  90 tablet  3  . furosemide (LASIX) 40 MG tablet Take 1 tablet (40 mg total) by mouth daily.  90 tablet  1  . gabapentin (NEURONTIN) 300 MG  capsule Take 300 mg by mouth 4 (four) times daily.      Marland Kitchen glimepiride (AMARYL) 4 MG tablet Take 1 tablet (4 mg total) by mouth daily before breakfast.  90 tablet  3  . guaiFENesin (MUCINEX) 600 MG 12 hr tablet Take 1,200 mg by mouth 2 (two) times daily as needed.       Marland Kitchen lisinopril (PRINIVIL,ZESTRIL) 5 MG tablet Take 1 tablet (5 mg total) by mouth daily.  90 tablet  3  . oxyCODONE-acetaminophen (PERCOCET) 5-325 MG per tablet Take 1 tablet by mouth every 6 (six) hours as needed.       . potassium chloride SA (KLOR-CON M20) 20 MEQ tablet TAKE 1 TABLET TWICE A DAY  180 tablet  3  . PROAIR HFA 108 (90 BASE) MCG/ACT inhaler INHALE 2 PUFFS INTO THE LUNGS EVERY 6 HOURS AS NEEDED FOR WHEEZING  8.5 g  3  . Tamsulosin HCl (FLOMAX) 0.4 MG CAPS Take 1 capsule (0.4 mg total) by mouth daily.  90 capsule  3  . tiotropium (SPIRIVA HANDIHALER) 18  MCG inhalation capsule Place 1 capsule (18 mcg total) into inhaler and inhale daily. 1 inhale every morning  90 capsule  3    No Known Allergies  Past Medical History  Diagnosis Date  . COPD (chronic obstructive pulmonary disease)     GOLD II; Spirometry 07/10/2008 >FEV1 1.46 56% predicted, ratio of 66%  . Anemia     iron deficiency anemia with previous severe GI bleed   . Hypertension   . Hyperlipidemia   . Left bundle branch block   . Prostatic hypertrophy 09-11-97    Benign  . Obesity   . CHF (congestive heart failure)     secondary to nonischemic cardiomyopathy; ECHO 10/07 EF 20-25%, mod to severe MR; ECHO 1/10 EF 55-60%, mild MR; ECHO 2/11 55-65%, grade 1 diast dysfxn, miod to mod LAE, mild TR;    S/P Medtronic BiV ICD with biventricular function now turned off due to diahragmatic simulation  . CAD (coronary artery disease)     Mild, nonobstructive (LHC 1/07: mLAD 20%, pCFX 20-30%, mRCA 30%, EF 25%)  . Atrial fibrillation     s/p AV node ablation; not on coumadin due to GIB  . Cardiac arrest - ventricular fibrillation 02/2008    Aborted, shocked by ICD  .  Dyspnea     exertional  . Pneumonia     01/2011  . Diabetes mellitus     type 2 NIDDM x 5-6 yrs  . GERD (gastroesophageal reflux disease)   . Cancer     prostate  . Arthritis     back and knees  . 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 Inguinal hernia     left; asymptomatic    Past Surgical History  Procedure Date  . Lobectomy 01-21-98    lung  . Rotator cuff repair 1994 and 1995  . Knee surgery 1996    Left  . Cardiac catheterization 01/2006    Showed mild nonobstructive CAD  . Cardiac catheterization 11/05/2006    Right atrial pressure mean of 12, RV pressure 36/8, PA pressure 39/16 witha mean of 28, wedge pressure was 20. Fick cardiac output was 5 liters per minute, cardiac index was 2.4  . Esophagogastroduodenoscopy 09/1997    Prepylor ulcer, esoph ring, duod avm  . Colonoscopy w/ polypectomy 11/1997    Divertics, int hemms  . Mch gi bleed 02/07-02/12/2002  . Esophagogastroduodenoscopy 05/22/2002    Poss Barrett's   . Colonoscopy 06/26/2002    Multip (neg) divertics, int hemm  . Colonoscopy 07/10/2004    Poyps, divertics, int hemms  . Mch sob 10/11-10/18/2007    A fib, CHF  . Esophagogastroduodenoscopy 02/19/2006    HH No active bleeding  . Ct head limited w/o cm 10/03/2006    No acute abnmlty  . Coronary angioplasty     Min abstrut zd severe LB dystn EF 25%  . Mch x 11/08-11/03/2006    Acute blood loss, anemia, sys HF, isch cardiomyopathy  . Hosp 8/14-8/23/2008    Acute on chronis CHF IIIB NOnisch Cardiomyop EF 20-25% Mod-Sev MR  . Colonoscopy 06/19/2008    2 polyps divertics int hemms (Dr Marina Goodell)  . Insert / replace / remove pacemaker 2007  . Eye surgery     cataract, bilateral  . Anterior cervical discectomy 02/2010  . Knee arthroscopy 1988  . Lumbar laminectomy/decompression microdiscectomy 04/29/2011    Procedure: LUMBAR LAMINECTOMY/DECOMPRESSION MICRODISCECTOMY;  Surgeon: Tia Alert, MD;  Location: [facility];  Service:  Neurosurgery;  Laterality: N/A;  Lumbar Two, Three, Four, Five Decompressive Lumbar Laminectomies    Family History  Problem Relation Age of Onset  . Heart failure Mother     CHF  . Heart failure Father     CHF  . Hypertension Sister   . Obesity Sister   . Cancer Brother     lung cancer  . Lung cancer Brother   . Liver disease Brother     ETOH  . Alcohol abuse Brother   . Cancer Brother   . COPD Brother   . Cancer Brother   . Anxiety disorder Brother   . COPD Brother   . Hypertension Sister     History   Social History  . Marital Status: Married    Spouse Name: N/A    Number of Children: N/A  . Years of Education: N/A   Occupational History  . [employer]    Retired  .     Social History Main Topics  . Smoking status: Current Every Day Smoker -- 1.0 packs/day for 60 years    Types: Cigarettes  . Smokeless tobacco: Never Used     Comment: Smoker since 66, quit for 2 years and started back in 03/2008  . Alcohol Use: No  . Drug Use: No  . Sexually Active: Not on file   Other Topics Concern  . Not on file   Social History Narrative   Has living will: Full Code.HCPOA: wife and daughterMarried, lives with wife and 1 adopted child   Review of Systems No rash No vomiting or diarrhea Still with his acid reflux---at night if he has coffee or tea Appetite is okay    Objective:   Physical Exam  Constitutional: He appears well-developed and well-nourished. No distress.       Slight cough  HENT:       No sinus tenderness TMs normal Mild nasal congestion Pharynx slight injection without exudate  Neck: Normal range of motion. Neck supple.  Pulmonary/Chest: Effort normal. No respiratory distress. He has no wheezes. He has no rales.       Slightly decreased breath sounds  Not tight  Lymphadenopathy:    He has no cervical adenopathy.          Assessment & Plan:

## 2012-04-07 NOTE — Assessment & Plan Note (Signed)
Seems to be viral infection Discussed that antibiotics not likely to be helpful Supportive care If he worsens over the next few days, would start empiric amoxicillin 500mg   2 bid #40 x 0

## 2012-04-15 ENCOUNTER — Encounter: Payer: Self-pay | Admitting: Family Medicine

## 2012-04-15 ENCOUNTER — Ambulatory Visit (INDEPENDENT_AMBULATORY_CARE_PROVIDER_SITE_OTHER): Payer: Medicare Other | Admitting: Family Medicine

## 2012-04-15 VITALS — BP 110/72 | HR 76 | Temp 98.2°F | Ht 66.0 in | Wt 194.2 lb

## 2012-04-15 DIAGNOSIS — J441 Chronic obstructive pulmonary disease with (acute) exacerbation: Secondary | ICD-10-CM | POA: Diagnosis not present

## 2012-04-15 MED ORDER — PREDNISONE 20 MG PO TABS: ORAL_TABLET | ORAL | Status: DC

## 2012-04-15 MED ORDER — AZITHROMYCIN 250 MG PO TABS: ORAL_TABLET | ORAL | Status: DC

## 2012-04-15 NOTE — Patient Instructions (Addendum)
Mucinex DM. Prednisone taper. Use albuterol for wheeze. Start antibiotics. Follow up next Tuesday. Got to ER if severe SOB.

## 2012-04-15 NOTE — Progress Notes (Signed)
  Subjective:    Patient ID: [name], male    DOB: 1930/04/25, 79 y.o.   MRN: [id number]  Cough This is a new problem. The current episode started 1 to 4 weeks ago. The problem has been gradually worsening. The cough is productive of sputum. Associated symptoms include ear congestion, headaches, nasal congestion, shortness of breath and wheezing. Pertinent negatives include no chills, ear pain, fever or sore throat. He has tried a beta-agonist inhaler and OTC cough suppressant (nasal saline, using albuterol once to twice  daily, mucinex) for the symptoms. The treatment provided mild relief. His past medical history is significant for COPD and environmental allergies. smoker 60 plus pack years   Needs mobility assessment for wheelchair. Can only walk 10 feet with out needing to sit due to back pain. Cannot stand for more than a few minutes without sitting. She is very out of balance when walking. Last fall one month ago, lost balance. Has fallen 9 ties in last 3 years since hip replacement. Constantly tipping backwards with walker. Trouble using walker and cane. [facility].   Review of Systems  Constitutional: Negative for fever and chills.  HENT: Negative for ear pain and sore throat.   Respiratory: Positive for cough, shortness of breath and wheezing.   Neurological: Positive for headaches.  Hematological: Positive for environmental allergies.       Objective:   Physical Exam  Constitutional: Vital signs are normal. He appears well-developed and well-nourished.  Non-toxic appearance. He does not appear ill. No distress.  HENT:  Head: Normocephalic and atraumatic.  Right Ear: Hearing, tympanic membrane, external ear and ear canal normal. No tenderness. No foreign bodies. Tympanic membrane is not retracted and not bulging.  Left Ear: Hearing, tympanic membrane, external ear and ear canal normal. No tenderness. No foreign bodies. Tympanic membrane is not retracted and not  bulging.  Nose: Nose normal. No mucosal edema or rhinorrhea. Right sinus exhibits no maxillary sinus tenderness and no frontal sinus tenderness. Left sinus exhibits no maxillary sinus tenderness and no frontal sinus tenderness.  Mouth/Throat: Uvula is midline, oropharynx is clear and moist and mucous membranes are normal. Normal dentition. No dental caries. No oropharyngeal exudate or tonsillar abscesses.  Eyes: Conjunctivae normal, EOM and lids are normal. Pupils are equal, round, and reactive to light. No foreign bodies found.  Neck: Trachea normal, normal range of motion and phonation normal. Neck supple. Carotid bruit is not present. No mass and no thyromegaly present.  Cardiovascular: Normal rate, regular rhythm, S1 normal, S2 normal, normal heart sounds, intact distal pulses and normal pulses.  Exam reveals no gallop.   No murmur heard. Pulmonary/Chest: Effort normal. No respiratory distress. He has no decreased breath sounds. He has wheezes in the right upper field, the right middle field, the right lower field, the left upper field, the left middle field and the left lower field. He has no rhonchi. He has no rales.  Abdominal: Soft. Normal appearance and bowel sounds are normal. There is no hepatosplenomegaly. There is no tenderness. There is no rebound, no guarding and no CVA tenderness. No hernia.  Neurological: He is alert. He has normal reflexes.  Skin: Skin is warm, dry and intact. No rash noted.  Psychiatric: He has a normal mood and affect. His speech is normal and behavior is normal. Judgment normal.          Assessment & Plan:

## 2012-04-19 ENCOUNTER — Ambulatory Visit (INDEPENDENT_AMBULATORY_CARE_PROVIDER_SITE_OTHER): Payer: Medicare Other | Admitting: Family Medicine

## 2012-04-19 ENCOUNTER — Encounter: Payer: Self-pay | Admitting: Family Medicine

## 2012-04-19 VITALS — BP 110/64 | HR 86 | Temp 97.7°F | Ht 66.0 in | Wt 197.8 lb

## 2012-04-19 DIAGNOSIS — J441 Chronic obstructive pulmonary disease with (acute) exacerbation: Secondary | ICD-10-CM | POA: Diagnosis not present

## 2012-04-19 NOTE — Assessment & Plan Note (Signed)
Resolving. Symptomatic care. 

## 2012-04-19 NOTE — Progress Notes (Signed)
  Subjective:    Patient ID: [name], male    DOB: Sep 16, 1930, 79 y.o.   MRN: [id number]  HPI 79 year old male presents for 5 day follow up of COPD exacerbation.  He was treated with prednisone taper, Z-pack and albuterol prn. He reports today that  He feels excellent.  He is sleeping well. Cough decreased, no SOB, no wheezing.  No fever. No N/V. Just some congestion.  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   Review of Systems  Constitutional: Negative for fever and fatigue.  HENT: Negative for ear pain.   Respiratory:       Back at baseline  Cardiovascular: Negative for chest pain.  Gastrointestinal: Negative for abdominal pain.       Objective:   Physical Exam  Constitutional: Vital signs are normal. He appears well-developed and well-nourished.  Non-toxic appearance. He does not appear ill. No distress.  HENT:  Head: Normocephalic and atraumatic.  Right Ear: Hearing, tympanic membrane, external ear and ear canal normal. No tenderness. No foreign bodies. Tympanic membrane is not retracted and not bulging.  Left Ear: Hearing, tympanic membrane, external ear and ear canal normal. No tenderness. No foreign bodies. Tympanic membrane is not retracted and not bulging.  Nose: Nose normal. No mucosal edema or rhinorrhea. Right sinus exhibits no maxillary sinus tenderness and no frontal sinus tenderness. Left sinus exhibits no maxillary sinus tenderness and no frontal sinus tenderness.  Mouth/Throat: Uvula is midline, oropharynx is clear and moist and mucous membranes are normal. Normal dentition. No dental caries. No oropharyngeal exudate or tonsillar abscesses.  Eyes: Conjunctivae normal, EOM and lids are normal. Pupils are equal, round, and reactive to light. No foreign bodies found.  Neck: Trachea normal, normal range of motion and phonation normal. Neck supple. Carotid bruit is not present. No mass and no thyromegaly present.  Cardiovascular: Normal rate, regular rhythm,  S1 normal, S2 normal, normal heart sounds, intact distal pulses and normal pulses.  Exam reveals no gallop.   No murmur heard. Pulmonary/Chest: Effort normal and breath sounds normal. No respiratory distress. He has no wheezes. He has no rhonchi. He has no rales.  Abdominal: Soft. Normal appearance and bowel sounds are normal. There is no hepatosplenomegaly. There is no tenderness. There is no rebound, no guarding and no CVA tenderness. No hernia.  Neurological: He is alert. He has normal reflexes.  Skin: Skin is warm, dry and intact. No rash noted.  Psychiatric: He has a normal mood and affect. His speech is normal and behavior is normal. Judgment normal.          Assessment & Plan:

## 2012-05-16 ENCOUNTER — Telehealth: Payer: Self-pay | Admitting: Internal Medicine

## 2012-05-16 ENCOUNTER — Encounter: Payer: Medicare Other | Admitting: *Deleted

## 2012-05-16 NOTE — Telephone Encounter (Signed)
Spoke w/daughter and scheduled pt for appt with SK on 05-25-12 @ 930. Pt thinks device shocked last week. Pt will be home from beach Tuesday or Wednesday.

## 2012-05-16 NOTE — Telephone Encounter (Signed)
New Problem:    Patient's daughter called in because her father is at the beach, cannot sent his wireless transmission because his device was left at home, and believes that his device went off latest week and would like to be seen earlier than his recall notice.  Please call back.

## 2012-05-23 ENCOUNTER — Telehealth: Payer: Self-pay | Admitting: Family Medicine

## 2012-05-23 NOTE — Telephone Encounter (Signed)
Patient Information:  Caller Name: Sheralyn Boatman  Phone: [phone]  Patient: Johnny, Diaz  Gender: Male  DOB: Nov 15, 1930  Age: 79 Years  PCP: Kerby Nora [facility])  Office Follow Up:  Does the office need to follow up with this patient?: No  Instructions For The Office: N/A   Symptoms  Reason For Call & Symptoms: Daughter calling. She is not with the patient; he is at the beach.  Reports patient has had a couple of "spells - almost like a seizure".  He gets cold, shaking, headaches, sinus pain.  Last episode "last night".  Dizziness.   Caller states she has told him to come home now.  Contacted patient on his cell phone [phone].  He states  frontal headache is worst symptom rated at 1 of 10.  .Clear nasal drainage.  Subjective fever.  Advised see provider immediately - to go to nearest Urgent Care.  He agreed.  Reviewed Health History In EMR: Yes  Reviewed Medications In EMR: Yes  Reviewed Allergies In EMR: Yes  Reviewed Surgeries / Procedures: Yes  Date of Onset of Symptoms: 05/09/2012  Treatments Tried: Tylenol  Treatments Tried Worked: Yes  Guideline(s) Used:  Dizziness  Headache  Headache  Disposition Per Guideline:   Go to Office Now  Reason For Disposition Reached:   Fever > 103 F (39.4 C)  Advice Given:  Call Back If:  You become worse.  RN Overrode Recommendation:  Go To [facility]  Patient is out of town.

## 2012-05-23 NOTE — Telephone Encounter (Signed)
Will route to pcp 

## 2012-05-24 NOTE — Telephone Encounter (Signed)
Noted  

## 2012-05-25 ENCOUNTER — Encounter: Payer: Self-pay | Admitting: Internal Medicine

## 2012-05-25 ENCOUNTER — Ambulatory Visit (INDEPENDENT_AMBULATORY_CARE_PROVIDER_SITE_OTHER): Payer: Medicare Other | Admitting: Internal Medicine

## 2012-05-25 VITALS — BP 100/63 | HR 70 | Ht 66.0 in | Wt 198.8 lb

## 2012-05-25 DIAGNOSIS — I4891 Unspecified atrial fibrillation: Secondary | ICD-10-CM | POA: Diagnosis not present

## 2012-05-25 DIAGNOSIS — I472 Ventricular tachycardia: Secondary | ICD-10-CM

## 2012-05-25 DIAGNOSIS — Z95810 Presence of automatic (implantable) cardiac defibrillator: Secondary | ICD-10-CM

## 2012-05-25 DIAGNOSIS — I4729 Other ventricular tachycardia: Secondary | ICD-10-CM | POA: Insufficient documentation

## 2012-05-25 DIAGNOSIS — I5032 Chronic diastolic (congestive) heart failure: Secondary | ICD-10-CM

## 2012-05-25 DIAGNOSIS — I4901 Ventricular fibrillation: Secondary | ICD-10-CM

## 2012-05-25 LAB — ICD DEVICE OBSERVATION
AL AMPLITUDE: 1 mv
AL IMPEDENCE ICD: 494 Ohm
ATRIAL PACING ICD: 0 pct
BATTERY VOLTAGE: 2.7571 V
LV LEAD IMPEDENCE ICD: 418 Ohm
RV LEAD IMPEDENCE ICD: 627 Ohm
RV LEAD THRESHOLD: 1 V
TOT-0002: 3
TOT-0006: 20110107000000
TZAT-0001ATACH: 3
TZAT-0001FASTVT: 1
TZAT-0001SLOWVT: 1
TZAT-0001SLOWVT: 2
TZAT-0002ATACH: NEGATIVE
TZAT-0002FASTVT: NEGATIVE
TZAT-0004SLOWVT: 8
TZAT-0004SLOWVT: 8
TZAT-0011ATACH: 10 ms
TZAT-0012ATACH: 150 ms
TZAT-0012FASTVT: 170 ms
TZAT-0013SLOWVT: 2
TZAT-0018FASTVT: NEGATIVE
TZAT-0019ATACH: 6 V
TZAT-0019ATACH: 6 V
TZAT-0019FASTVT: 8 V
TZAT-0020ATACH: 1.5 ms
TZAT-0020ATACH: 1.5 ms
TZAT-0020SLOWVT: 1.5 ms
TZAT-0020SLOWVT: 1.5 ms
TZON-0003ATACH: 350 ms
TZON-0003VSLOWVT: 450 ms
TZON-0004VSLOWVT: 20
TZST-0001ATACH: 5
TZST-0001FASTVT: 4
TZST-0001FASTVT: 6
TZST-0001SLOWVT: 3
TZST-0001SLOWVT: 5
TZST-0002ATACH: NEGATIVE
TZST-0002ATACH: NEGATIVE
TZST-0002FASTVT: NEGATIVE
TZST-0002FASTVT: NEGATIVE
TZST-0003SLOWVT: 35 J
VENTRICULAR PACING ICD: 99.28 pct

## 2012-05-25 LAB — BASIC METABOLIC PANEL
Creatinine, Ser: 1.1 mg/dL (ref 0.4–1.5)
Sodium: 137 mEq/L (ref 135–145)

## 2012-05-25 LAB — CBC WITH DIFFERENTIAL/PLATELET
Basophils Relative: 0.7 % (ref 0.0–3.0)
Eosinophils Absolute: 0.3 10*3/uL (ref 0.0–0.7)
HCT: 35.4 % — ABNORMAL LOW (ref 39.0–52.0)
Hemoglobin: 11.9 g/dL — ABNORMAL LOW (ref 13.0–17.0)
Lymphocytes Relative: 30.9 % (ref 12.0–46.0)
Lymphs Abs: 2.4 10*3/uL (ref 0.7–4.0)
MCHC: 33.6 g/dL (ref 30.0–36.0)
Neutro Abs: 4.3 10*3/uL (ref 1.4–7.7)
RBC: 3.82 Mil/uL — ABNORMAL LOW (ref 4.22–5.81)

## 2012-05-25 LAB — MAGNESIUM: Magnesium: 2.1 mg/dL (ref 1.5–2.5)

## 2012-05-25 LAB — LIPID PANEL: VLDL: 42.4 mg/dL — ABNORMAL HIGH (ref 0.0–40.0)

## 2012-05-25 NOTE — Assessment & Plan Note (Signed)
Stable in euvolemic 

## 2012-05-25 NOTE — Assessment & Plan Note (Signed)
Permanent He is not on anticoagulation because of GI bleeding. It should be reviewed once we get to the acute episode

## 2012-05-25 NOTE — Assessment & Plan Note (Signed)
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  Differential diagnosis includes cardiomyopathy, ischemia, electrolyte disorders and pro arrhythmia. I don't see any potentially causative drugs. The azithromycin on his MAR is a month ago. We will check his potassium and magnesium. We'll check a CBC with his history of anemia. We'll undertake a Myoview scan which will also help [place] assess left ventricular function

## 2012-05-25 NOTE — Patient Instructions (Signed)
Your physician recommends that you have lab work today: bmp/cbc/magnesium/tsh/direct LDL/lipid  Your physician has requested that you have a lexiscan myoview. For further information please visit [email or web address] Please follow instruction sheet, as given.  Your physician recommends that you schedule a follow-up appointment in: 2 months with Dr. Graciela Husbands.  No driving for 3 months.

## 2012-05-25 NOTE — Assessment & Plan Note (Signed)
The patient's device was interrogated.  The information was reviewed. No changes were made in the programming.    

## 2012-05-25 NOTE — Progress Notes (Signed)
f Patient Care Team: Excell Seltzer, MD as PCP - General (Family Medicine) Dolores Patty, MD (Cardiology) Dolores Patty, MD (Cardiology) Duke Salvia, MD (Cardiology)   HPI  [name] is a 79 y.o. male Seen in followup for CRT-D implantation in the setting of nonischemic myopathy and atrial fibrillation. He underwent generator replacement January 2011.  He is status post AV junction ablation. He has had intercurrent normalization of LV systolic function.  November 2010 he had syncope with appropriate ICD  therapy.   When we saw him in October and going pretty well. He has had intercurrent COPD exacerbation in the last month has had 2 episodes of aborted polymorphic ventricular tachycardia. Last blood work since August of 2013  He has had no chest pain. He was on antibiotics about 6 weeks ago. He had 2 episodes as noted both associated with presyncope and unassociated with exertion  Past Medical History  Diagnosis Date  . COPD (chronic obstructive pulmonary disease)     GOLD II; Spirometry 07/10/2008 >FEV1 1.46 56% predicted, ratio of 66%  . Anemia     iron deficiency anemia with previous severe GI bleed   . Hypertension   . Hyperlipidemia   . Left bundle branch block   . Prostatic hypertrophy 09-11-97    Benign  . Obesity   . CHF (congestive heart failure)     secondary to nonischemic cardiomyopathy; ECHO 10/07 EF 20-25%, mod to severe MR; ECHO 1/10 EF 55-60%, mild MR; ECHO 2/11 55-65%, grade 1 diast dysfxn, miod to mod LAE, mild TR;    S/P [employer] BiV ICD with biventricular function now turned off due to diahragmatic simulation  . CAD (coronary artery disease)     Mild, nonobstructive (LHC 1/07: mLAD 20%, pCFX 20-30%, mRCA 30%, EF 25%)  . Atrial fibrillation     s/p AV node ablation; not on coumadin due to GIB  . Cardiac arrest - ventricular fibrillation 02/2008    Aborted, shocked by ICD  . Dyspnea     exertional  . Pneumonia     01/2011  . Diabetes  mellitus     type 2 NIDDM x 5-6 yrs  . GERD (gastroesophageal reflux disease)   . Cancer     prostate  . Arthritis     back and knees  . 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 Inguinal hernia     left; asymptomatic    Past Surgical History  Procedure Laterality Date  . Lobectomy  01-21-98    lung  . Rotator cuff repair  1994 and 1995  . Knee surgery  1996    Left  . Cardiac catheterization  01/2006    Showed mild nonobstructive CAD  . Cardiac catheterization  11/05/2006    Right atrial pressure mean of 12, RV pressure 36/8, PA pressure 39/16 witha mean of 28, wedge pressure was 20. Fick cardiac output was 5 liters per minute, cardiac index was 2.4  . Esophagogastroduodenoscopy  09/1997    Prepylor ulcer, esoph ring, duod avm  . Colonoscopy w/ polypectomy  11/1997    Divertics, int hemms  . Mch gi bleed  02/07-02/12/2002  . Esophagogastroduodenoscopy  05/22/2002    Poss Barrett's   . Colonoscopy  06/26/2002    Multip (neg) divertics, int hemm  . Colonoscopy  07/10/2004    Poyps, divertics, int hemms  . Mch sob  10/11-10/18/2007    A fib, CHF  . Esophagogastroduodenoscopy  02/19/2006    HH No active bleeding  . Ct head limited w/o cm  10/03/2006    No acute abnmlty  . Coronary angioplasty      Min abstrut zd severe LB dystn EF 25%  . Mch x  11/08-11/03/2006    Acute blood loss, anemia, sys HF, isch cardiomyopathy  . Hosp  8/14-8/23/2008    Acute on chronis CHF IIIB NOnisch Cardiomyop EF 20-25% Mod-Sev MR  . Colonoscopy  06/19/2008    2 polyps divertics int hemms (Dr Marina Goodell)  . Insert / replace / remove pacemaker  2007  . Eye surgery      cataract, bilateral  . Anterior cervical discectomy  02/2010  . Knee arthroscopy  1988  . Lumbar laminectomy/decompression microdiscectomy  04/29/2011    Procedure: LUMBAR LAMINECTOMY/DECOMPRESSION MICRODISCECTOMY;  Surgeon: Tia Alert, MD;  Location: [facility];  Service: Neurosurgery;  Laterality: N/A;  Lumbar  Two, Three, Four, Five Decompressive Lumbar Laminectomies    Current Outpatient Prescriptions  Medication Sig Dispense Refill  . ACCU-CHEK AVIVA PLUS test strip USE AND DISCARD 1 TEST     STRIP DAILY AND AS NEEDED  FOR DIABETES  100 strip  3  . acetaminophen (TYLENOL) 500 MG tablet Take 500 mg by mouth every 6 (six) hours as needed.        Marland Kitchen aspirin buffered (ADPRIN B) 325 MG TABS tablet Take 325 mg by mouth daily.        Marland Kitchen atorvastatin (LIPITOR) 20 MG tablet Take 1 tablet (20 mg total) by mouth daily.  90 tablet  3  . azithromycin (ZITHROMAX) 250 MG tablet 2 tab po x 1 day then 1 tab po daily  6 tablet  0  . carvedilol (COREG) 6.25 MG tablet Take 1 tablet (6.25 mg total) by mouth 2 (two) times daily with a meal.  180 tablet  3  . Cholecalciferol (TH VITAMIN D3) 2000 UNITS CAPS Take by mouth daily.        . colchicine 0.6 MG tablet Take 1 tablet (0.6 mg total) by mouth 2 (two) times daily.  180 tablet  3  . Cyanocobalamin (VITAMIN B-12) 2500 MCG SUBL Place under the tongue daily.        Marland Kitchen dexlansoprazole (DEXILANT) 60 MG capsule Take 60 mg by mouth daily.      Marland Kitchen dutasteride (AVODART) 0.5 MG capsule Take 1 capsule (0.5 mg total) by mouth daily.  90 capsule  3  . eplerenone (INSPRA) 25 MG tablet Take 1 tablet (25 mg total) by mouth daily.  90 tablet  3  . ferrous sulfate 325 (65 FE) MG tablet Take 1 tablet (325 mg total) by mouth daily with breakfast.  90 tablet  3  . furosemide (LASIX) 40 MG tablet Take 1 tablet (40 mg total) by mouth daily.  90 tablet  1  . gabapentin (NEURONTIN) 300 MG capsule Take 300 mg by mouth 4 (four) times daily.      Marland Kitchen glimepiride (AMARYL) 4 MG tablet Take 1 tablet (4 mg total) by mouth daily before breakfast.  90 tablet  3  . guaiFENesin (MUCINEX) 600 MG 12 hr tablet Take 1,200 mg by mouth 2 (two) times daily as needed.       Marland Kitchen lisinopril (PRINIVIL,ZESTRIL) 5 MG tablet Take 1 tablet (5 mg total) by mouth daily.  90 tablet  3  . oxyCODONE-acetaminophen (PERCOCET) 5-325  MG per tablet Take 1 tablet by mouth every 6 (six) hours as needed.       Marland Kitchen  potassium chloride SA (KLOR-CON M20) 20 MEQ tablet TAKE 1 TABLET TWICE A DAY  180 tablet  3  . predniSONE (DELTASONE) 20 MG tablet 3 tabs by mouth daily x 3 days, then 2 tabs by mouth daily x 2 days then 1 tab by mouth daily x 2 days  15 tablet  0  . Tamsulosin HCl (FLOMAX) 0.4 MG CAPS Take 1 capsule (0.4 mg total) by mouth daily.  90 capsule  3  . tiotropium (SPIRIVA HANDIHALER) 18 MCG inhalation capsule Place 1 capsule (18 mcg total) into inhaler and inhale daily. 1 inhale every morning  90 capsule  3  . PROAIR HFA 108 (90 BASE) MCG/ACT inhaler INHALE 2 PUFFS INTO THE LUNGS EVERY 6 HOURS AS NEEDED FOR WHEEZING  8.5 g  3   No current facility-administered medications for this visit.    No Known Allergies  Review of Systems negative except from HPI and PMH  Physical Exam BP 100/63  Pulse 70  Ht 5\' 6"  (1.676 m)  Wt 198 lb 12 oz (90.152 kg)  BMI 32.09 kg/m2 Well developed and well nourished in no acute distress HENT normal E scleral and icterus clear Neck Supple JVP flat; carotids brisk and full Clear to ausculation Device pocket well healed; without hematoma or erythema Regular rate and rhythm, 2/6 systolic murmur at apex Soft with active bowel sounds No clubbing cyanosis none Edema Alert and oriented, grossly normal motor and sensory function Skin Warm and Dry    Assessment and  Plan

## 2012-05-26 ENCOUNTER — Ambulatory Visit: Payer: Medicare Other | Admitting: Family Medicine

## 2012-05-31 ENCOUNTER — Ambulatory Visit (HOSPITAL_COMMUNITY): Payer: Medicare Other | Attending: Cardiovascular Disease | Admitting: Radiology

## 2012-05-31 VITALS — BP 116/72 | HR 70 | Ht 66.0 in | Wt 196.0 lb

## 2012-05-31 DIAGNOSIS — Z95810 Presence of automatic (implantable) cardiac defibrillator: Secondary | ICD-10-CM

## 2012-05-31 DIAGNOSIS — I4891 Unspecified atrial fibrillation: Secondary | ICD-10-CM | POA: Diagnosis not present

## 2012-05-31 DIAGNOSIS — E669 Obesity, unspecified: Secondary | ICD-10-CM | POA: Insufficient documentation

## 2012-05-31 DIAGNOSIS — I447 Left bundle-branch block, unspecified: Secondary | ICD-10-CM | POA: Insufficient documentation

## 2012-05-31 DIAGNOSIS — F17200 Nicotine dependence, unspecified, uncomplicated: Secondary | ICD-10-CM | POA: Diagnosis not present

## 2012-05-31 DIAGNOSIS — I2510 Atherosclerotic heart disease of native coronary artery without angina pectoris: Secondary | ICD-10-CM | POA: Diagnosis not present

## 2012-05-31 DIAGNOSIS — E785 Hyperlipidemia, unspecified: Secondary | ICD-10-CM | POA: Insufficient documentation

## 2012-05-31 DIAGNOSIS — I4901 Ventricular fibrillation: Secondary | ICD-10-CM

## 2012-05-31 DIAGNOSIS — I4892 Unspecified atrial flutter: Secondary | ICD-10-CM

## 2012-05-31 DIAGNOSIS — I10 Essential (primary) hypertension: Secondary | ICD-10-CM | POA: Diagnosis not present

## 2012-05-31 DIAGNOSIS — E119 Type 2 diabetes mellitus without complications: Secondary | ICD-10-CM | POA: Diagnosis not present

## 2012-05-31 DIAGNOSIS — I472 Ventricular tachycardia: Secondary | ICD-10-CM

## 2012-05-31 DIAGNOSIS — I5032 Chronic diastolic (congestive) heart failure: Secondary | ICD-10-CM

## 2012-05-31 DIAGNOSIS — R42 Dizziness and giddiness: Secondary | ICD-10-CM | POA: Insufficient documentation

## 2012-05-31 DIAGNOSIS — R0602 Shortness of breath: Secondary | ICD-10-CM | POA: Diagnosis not present

## 2012-05-31 MED ORDER — TECHNETIUM TC 99M SESTAMIBI GENERIC - CARDIOLITE
11.0000 | Freq: Once | INTRAVENOUS | Status: AC | PRN
  Administered 2012-05-31: 11 via INTRAVENOUS

## 2012-05-31 MED ORDER — AMINOPHYLLINE 25 MG/ML IV SOLN
75.0000 mg | Freq: Once | INTRAVENOUS | Status: AC
  Administered 2012-05-31: 75 mg via INTRAVENOUS

## 2012-05-31 MED ORDER — REGADENOSON 0.4 MG/5ML IV SOLN
0.4000 mg | Freq: Once | INTRAVENOUS | Status: AC
  Administered 2012-05-31: 0.4 mg via INTRAVENOUS

## 2012-05-31 MED ORDER — TECHNETIUM TC 99M SESTAMIBI GENERIC - CARDIOLITE
33.0000 | Freq: Once | INTRAVENOUS | Status: AC | PRN
  Administered 2012-05-31: 33 via INTRAVENOUS

## 2012-05-31 NOTE — Progress Notes (Signed)
[facility] SITE 3 NUCLEAR MED [address] [place], [place] [address] [phone]    Cardiology Nuclear Med Study  [name] is a 79 y.o. male     MRN : [id number]     DOB: January 03, 1931  Procedure Date: 05/31/2012  Nuclear Med Background Indication for Stress Test:  Evaluation for Ischemia History:  '07 Cath:mild n/o CAD, EF=25%; '08 Ablation; '08 AICD>generator replacement in '11; '10 OZH:YQMV mild inferior ischemia, EF=45%; '11 Echo:EF=65%; h/o atrial fibrillation Cardiac Risk Factors: Hypertension, LBBB, Lipids, NIDDM, Obesity and Smoker  Symptoms:  Dizziness, DOE, Light-Headedness and Near Syncope   Nuclear Pre-Procedure Caffeine/Decaff Intake:  7:00pm NPO After: 12:00am   Lungs:  Diminished, but clear. O2 Sat: 98% on room air. IV 0.9% NS with Angio Cath:  22g  IV Site: R Hand  IV Started by:  Cathlyn Parsons, RN  Chest Size (in):  44 Cup Size: n/a  Height: 5\' 6"  (1.676 m)  Weight:  196 lb (88.905 kg)  BMI:  Body mass index is 31.65 kg/(m^2). Tech Comments:  Coreg held x 12 hours    Nuclear Med Study 1 or 2 day study: 1 day  Stress Test Type:  Lexiscan  Reading MD: Kristeen Miss, MD  Order Authorizing Provider:  Sherryl Manges, MD  Resting Radionuclide: Technetium 39m Sestamibi  Resting Radionuclide Dose: 11.0 mCi   Stress Radionuclide:  Technetium 26m Sestamibi  Stress Radionuclide Dose: 33.0 mCi           Stress Protocol Rest HR: 70 Stress HR: 75  Rest BP: 116/72 Stress BP: 131/65  Exercise Time (min): n/a METS: n/a   Predicted Max HR: 141 bpm % Max HR: 53.19 bpm Rate Pressure Product: 9825   Dose of Adenosine (mg):  n/a Dose of Lexiscan: 0.4 mg  Dose of Atropine (mg): n/a Dose of Dobutamine: n/a mcg/kg/min (at max HR)  Stress Test Technologist: Smiley Houseman, CMA-N  Nuclear Technologist:  Domenic Polite, CNMT     Rest Procedure:  Myocardial perfusion imaging was performed at rest 45 minutes following the intravenous administration of  Technetium 75m Sestamibi.  Rest ECG: ventricular pacing  Stress Procedure:  The patient received IV Lexiscan 0.4 mg over 15-seconds.  Technetium 26m Sestamibi injected at 30-seconds.  He c/o stomach cramps and headache that was relieved with Aminophylline 75 mg IV.  Quantitative spect images were obtained after a 45 minute delay.  Stress ECG: No significant change from baseline ECG  QPS Raw Data Images:  Normal; no motion artifact; normal heart/lung ratio. Stress Images:  Normal homogeneous uptake in all areas of the myocardium. Rest Images:  Normal homogeneous uptake in all areas of the myocardium. Subtraction (SDS):  No evidence of ischemia. Transient Ischemic Dilatation (Normal <1.22):  1.14 Lung/Heart Ratio (Normal <0.45):  0.31  Quantitative Gated Spect Images QGS EDV:  98 ml QGS ESV:  43 ml  Impression Exercise Capacity:  Lexiscan with no exercise. BP Response:  Normal blood pressure response. Clinical Symptoms:  No significant symptoms noted. ECG Impression:  No significant ST segment change suggestive of ischemia. Comparison with Prior Nuclear Study: No images to compare.  Overall Impression:  Normal stress nuclear study.  No evidence of ischemia.  Normal LV function.  LV Ejection Fraction: 56%.  LV Wall Motion:  NL LV Function; NL Wall Motion    Vesta Mixer, Montez Hageman., MD, [facility] 05/31/2012, 5:57 PM Office - [phone] Pager [phone]

## 2012-06-03 ENCOUNTER — Ambulatory Visit (INDEPENDENT_AMBULATORY_CARE_PROVIDER_SITE_OTHER): Payer: Medicare Other | Admitting: Family Medicine

## 2012-06-03 ENCOUNTER — Encounter: Payer: Self-pay | Admitting: Family Medicine

## 2012-06-03 VITALS — BP 120/64 | HR 80 | Temp 98.4°F | Ht 66.0 in | Wt 198.0 lb

## 2012-06-03 DIAGNOSIS — E559 Vitamin D deficiency, unspecified: Secondary | ICD-10-CM | POA: Diagnosis not present

## 2012-06-03 DIAGNOSIS — E119 Type 2 diabetes mellitus without complications: Secondary | ICD-10-CM

## 2012-06-03 DIAGNOSIS — R5381 Other malaise: Secondary | ICD-10-CM | POA: Insufficient documentation

## 2012-06-03 DIAGNOSIS — R5383 Other fatigue: Secondary | ICD-10-CM | POA: Diagnosis not present

## 2012-06-03 DIAGNOSIS — E538 Deficiency of other specified B group vitamins: Secondary | ICD-10-CM | POA: Diagnosis not present

## 2012-06-03 DIAGNOSIS — D509 Iron deficiency anemia, unspecified: Secondary | ICD-10-CM

## 2012-06-03 DIAGNOSIS — E785 Hyperlipidemia, unspecified: Secondary | ICD-10-CM

## 2012-06-03 LAB — HEMOGLOBIN A1C: Hgb A1c MFr Bld: 6.1 % (ref 4.6–6.5)

## 2012-06-03 LAB — VITAMIN B12: Vitamin B-12: >1500 pg/mL — ABNORMAL HIGH (ref 211–911)

## 2012-06-03 NOTE — Assessment & Plan Note (Signed)
Labs reviewed: LDL at goal <70 on current med.

## 2012-06-03 NOTE — Assessment & Plan Note (Signed)
Recent stress test low risk. No clear pulmonary issue. Will eval b12 and vit D given history of deficiency. Thyroid and anemia stable on current regimen per recent labs. ? Related to poor DM control... Encouraged high protein , high fiber diet and regular meals small portion . Regular exercise as tolerated for deconditioning.

## 2012-06-03 NOTE — Assessment & Plan Note (Signed)
Unclear control... Will check A1C. May be able to D/C amaryl if he avoids sweets. This may help with frequent lows that might be contributing to fatigue.

## 2012-06-03 NOTE — Progress Notes (Signed)
  Subjective:    Patient ID: [name], male    DOB: Apr 05, 1931, 79 y.o.   MRN: [id number]  HPI  79 year old male with afib, DM, hx of vfib, CHF, COPD presents with 1 month of  off and on  Fatigue. Occ headaches, dizziness. No chest pain, no change in SOB or cough. Blood sugars have been fasting 100  50-70 if skipping meals, 200... Reflecting how he is eating.Marland Kitchen often poorlywith dessert.  Only on amaryl. No blood ins stool. No hematuria.   He has history of anemia.. Decreased to anemia 2 twice daily from 4 a day.  In past he has been on B12 injections... Now taking tablet.     Seeing Graciela Husbands since defibrillator shocked... Cardiolyte: Normal stress nuclear study. No evidence of ischemia. Normal LV function.  At that time had hg 11.9, nml thyroid, nml CMET. LDL at goal <70.  No A1C or B12 done.    Review of Systems  Constitutional: Positive for fatigue. Negative for fever.  HENT: Positive for congestion. Negative for ear pain, postnasal drip and sinus pressure.   Eyes: Negative for pain.  Respiratory: Negative for chest tightness and shortness of breath.   Cardiovascular: Negative for chest pain, palpitations and leg swelling.  Gastrointestinal: Negative for abdominal pain.  Genitourinary: Negative for dysuria.       Objective:   Physical Exam  Constitutional: Vital signs are normal. He appears well-developed and well-nourished.  Elderly male with cane in NAD, deconditioned  HENT:  Head: Normocephalic.  Right Ear: Hearing normal.  Left Ear: Hearing normal.  Nose: Nose normal.  Mouth/Throat: Oropharynx is clear and moist and mucous membranes are normal.  Neck: Trachea normal. Carotid bruit is not present. No mass and no thyromegaly present.  Cardiovascular: Normal rate, regular rhythm and normal pulses.  Exam reveals no gallop, no distant heart sounds and no friction rub.   No murmur heard. No peripheral edema  Pulmonary/Chest: Effort normal and breath sounds  normal. No respiratory distress.  Skin: Skin is warm, dry and intact. No rash noted.  Psychiatric: He has a normal mood and affect. His speech is normal and behavior is normal. Thought content normal.          Assessment & Plan:

## 2012-06-03 NOTE — Patient Instructions (Addendum)
Avoid skipping meals. Try to eat 3 meals with 2 snacks.  Work on smaller portions consistently, limit sweets and desserts. Increase fiber and protein in meals.  Work on [employer] as tolerated. Can use Glucerna instead of Boost.  We will call you with lab results.

## 2012-06-03 NOTE — Assessment & Plan Note (Signed)
Stable control on 2 tabs of iron a day.

## 2012-06-04 LAB — VITAMIN D 25 HYDROXY (VIT D DEFICIENCY, FRACTURES): Vit D, 25-Hydroxy: 50 ng/mL (ref 30–89)

## 2012-06-06 ENCOUNTER — Telehealth: Payer: Self-pay

## 2012-06-06 NOTE — Telephone Encounter (Signed)
Pt called to verify name of med pt is to stop. Advised from result note Amaryl or another name is Glimepiride. Pt voiced understanding and will contact [facility] to take off auto refill.

## 2012-06-07 ENCOUNTER — Encounter (HOSPITAL_COMMUNITY): Payer: Medicare Other

## 2012-06-09 ENCOUNTER — Telehealth: Payer: Self-pay | Admitting: Internal Medicine

## 2012-06-09 NOTE — Telephone Encounter (Signed)
Left message to call back  

## 2012-06-09 NOTE — Telephone Encounter (Signed)
They wanted to get myoview results and also find out when he needs to come in to be seen and dose he need any changes to his defib

## 2012-06-10 NOTE — Telephone Encounter (Signed)
I spoke with the patient's daughter, Sheralyn Boatman, and made her aware of the patient's results and answered her questions.

## 2012-06-10 NOTE — Telephone Encounter (Signed)
Follow up call    Returning call back to nurse from yesterday.

## 2012-06-24 ENCOUNTER — Ambulatory Visit (INDEPENDENT_AMBULATORY_CARE_PROVIDER_SITE_OTHER): Payer: Medicare Other | Admitting: Family Medicine

## 2012-06-24 ENCOUNTER — Encounter: Payer: Self-pay | Admitting: Family Medicine

## 2012-06-24 VITALS — BP 120/84 | HR 95 | Temp 97.6°F | Ht 66.0 in | Wt 200.2 lb

## 2012-06-24 DIAGNOSIS — J309 Allergic rhinitis, unspecified: Secondary | ICD-10-CM

## 2012-06-24 DIAGNOSIS — J441 Chronic obstructive pulmonary disease with (acute) exacerbation: Secondary | ICD-10-CM | POA: Diagnosis not present

## 2012-06-24 DIAGNOSIS — J069 Acute upper respiratory infection, unspecified: Secondary | ICD-10-CM

## 2012-06-24 MED ORDER — ALBUTEROL SULFATE HFA 108 (90 BASE) MCG/ACT IN AERS: INHALATION_SPRAY | RESPIRATORY_TRACT | Status: DC

## 2012-06-24 NOTE — Patient Instructions (Addendum)
Use mucinex if cough returns or nasal congestion. Start zyrtec at bedtime.  NO decongestant. If nasal congestion or sneezing persists...  Call and we can try nasal steroid. Continue nasal saline spray 2-3 times a day. [place] proair as needed. Contiue spiriva. Call me if not continuing to improve or earlier if shortness of breath. Return in 1 month for re-eval COPD with spirometry.  Quit smoking!

## 2012-06-24 NOTE — Progress Notes (Signed)
  Subjective:    Patient ID: [name], male    DOB: 10-13-1930, 79 y.o.   MRN: [id number]  HPI  79 year old amle with COPD, CHF, afib and tobacco abuse presents with  2 days  runny nose, sore throat, coughing constantly, dry.  He had noted some mild  increase in shortness of breath, wheeze yesterday, resolved today. No ear pain, no sinus pain.  Frequent sneezing,  Mildly  itchy eyes. Biggest complaint was nasl congestion symptoms.   Using proair  (3 times yesterday,once today) and mucinex.  No fever, no chills.   Feels much bettter today.   No further frequent lows since being off amaryl. FBS 125-131 (no increase since being sick)   Review of Systems  Constitutional: Negative for fever and fatigue.  HENT: Negative for ear pain.   Eyes: Negative for pain.  Cardiovascular: Negative for chest pain, palpitations and leg swelling.  Gastrointestinal: Negative for abdominal pain.       Objective:   Physical Exam  Constitutional: Vital signs are normal. He appears well-developed and well-nourished.  Elderly male in NAD  HENT:  Head: Normocephalic.  Right Ear: Hearing and external ear normal. A middle ear effusion is present.  Left Ear: Hearing and external ear normal. A middle ear effusion is present.  Nose: Mucosal edema and rhinorrhea present. Right sinus exhibits no maxillary sinus tenderness and no frontal sinus tenderness. Left sinus exhibits no maxillary sinus tenderness and no frontal sinus tenderness.  Mouth/Throat: Mucous membranes are normal. Posterior oropharyngeal erythema present. No posterior oropharyngeal edema or tonsillar abscesses.  Neck: Trachea normal. Carotid bruit is not present. No mass and no thyromegaly present.  Cardiovascular: Normal rate, regular rhythm and normal pulses.  Exam reveals no gallop, no distant heart sounds and no friction rub.   No murmur heard. No peripheral edema  Pulmonary/Chest: Effort normal. Not tachypneic. No respiratory  distress. He has decreased breath sounds. He has no wheezes. He has rhonchi. He has no rales.  Stable pulmonary exam   Skin: Skin is warm, dry and intact. No rash noted.  Psychiatric: He has a normal mood and affect. His speech is normal and behavior is normal. Thought content normal.          Assessment & Plan:

## 2012-06-24 NOTE — Assessment & Plan Note (Signed)
Symptomatic care with nasal saline and mucinex.

## 2012-06-24 NOTE — Assessment & Plan Note (Signed)
No clear COPD exacerbation but not great baseline control on spiriva. Return for repeat spirometry and possible advair initiation.

## 2012-06-24 NOTE — Assessment & Plan Note (Signed)
Trial of zyrtec at bedtime. Consider nasal steroid if not improving like expected.

## 2012-08-05 ENCOUNTER — Ambulatory Visit: Payer: Medicare Other | Admitting: Internal Medicine

## 2012-08-10 ENCOUNTER — Other Ambulatory Visit: Payer: Self-pay | Admitting: Physician Assistant

## 2012-08-10 ENCOUNTER — Encounter: Payer: Self-pay | Admitting: Internal Medicine

## 2012-08-10 ENCOUNTER — Ambulatory Visit (INDEPENDENT_AMBULATORY_CARE_PROVIDER_SITE_OTHER): Payer: Medicare Other | Admitting: Internal Medicine

## 2012-08-10 VITALS — BP 97/54 | HR 74 | Ht 67.0 in | Wt 192.5 lb

## 2012-08-10 DIAGNOSIS — Z95810 Presence of automatic (implantable) cardiac defibrillator: Secondary | ICD-10-CM | POA: Diagnosis not present

## 2012-08-10 DIAGNOSIS — R42 Dizziness and giddiness: Secondary | ICD-10-CM

## 2012-08-10 DIAGNOSIS — I5032 Chronic diastolic (congestive) heart failure: Secondary | ICD-10-CM

## 2012-08-10 DIAGNOSIS — I4891 Unspecified atrial fibrillation: Secondary | ICD-10-CM | POA: Diagnosis not present

## 2012-08-10 DIAGNOSIS — I472 Ventricular tachycardia: Secondary | ICD-10-CM | POA: Diagnosis not present

## 2012-08-10 LAB — ICD DEVICE OBSERVATION
AL AMPLITUDE: 0.4 mv
BATTERY VOLTAGE: 2.6821 V
LV LEAD IMPEDENCE ICD: 494 Ohm
RV LEAD IMPEDENCE ICD: 684 Ohm
RV LEAD THRESHOLD: 0.75 V
TOT-0006: 20110107000000
TZAT-0001ATACH: 1
TZAT-0001ATACH: 2
TZAT-0001SLOWVT: 1
TZAT-0001SLOWVT: 2
TZAT-0002ATACH: NEGATIVE
TZAT-0004ATACH: 15
TZAT-0004SLOWVT: 8
TZAT-0004SLOWVT: 8
TZAT-0005SLOWVT: 88 pct
TZAT-0005SLOWVT: 91 pct
TZAT-0011ATACH: 10 ms
TZAT-0012ATACH: 150 ms
TZAT-0013SLOWVT: 2
TZAT-0013SLOWVT: 2
TZAT-0018FASTVT: NEGATIVE
TZAT-0019ATACH: 6 V
TZAT-0019ATACH: 6 V
TZAT-0019ATACH: 6 V
TZAT-0019FASTVT: 8 V
TZAT-0020ATACH: 1.5 ms
TZAT-0020ATACH: 1.5 ms
TZAT-0020ATACH: 1.5 ms
TZON-0003ATACH: 350 ms
TZON-0003SLOWVT: 350 ms
TZON-0003VSLOWVT: 450 ms
TZON-0004SLOWVT: 28
TZON-0004VSLOWVT: 20
TZST-0001ATACH: 5
TZST-0001FASTVT: 2
TZST-0001FASTVT: 6
TZST-0001SLOWVT: 3
TZST-0001SLOWVT: 5
TZST-0001SLOWVT: 6
TZST-0002ATACH: NEGATIVE
TZST-0002ATACH: NEGATIVE
TZST-0002FASTVT: NEGATIVE
TZST-0002FASTVT: NEGATIVE
TZST-0002FASTVT: NEGATIVE
TZST-0003SLOWVT: 35 J
TZST-0003SLOWVT: 35 J
TZST-0003SLOWVT: 35 J
VF: 0

## 2012-08-10 MED ORDER — CARVEDILOL 3.125 MG PO TABS: 3.1250 mg | ORAL_TABLET | Freq: Two times a day (BID) | ORAL | Status: DC

## 2012-08-10 MED ORDER — EPLERENONE 25 MG PO TABS: ORAL_TABLET | ORAL | Status: DC

## 2012-08-10 MED ORDER — LISINOPRIL 2.5 MG PO TABS: 2.5000 mg | ORAL_TABLET | Freq: Every day | ORAL | Status: DC

## 2012-08-10 NOTE — Assessment & Plan Note (Signed)
Euvolemic. Continue current medications-see below

## 2012-08-10 NOTE — Progress Notes (Signed)
Patient Care Team: Excell Seltzer, MD as PCP - General (Family Medicine) Dolores Patty, MD (Cardiology) Dolores Patty, MD (Cardiology) Duke Salvia, MD (Cardiology)   HPI  [name] is a 79 y.o. male Seen in followup for CRT-D implantation in the setting of nonischemic myopathy and atrial fibrillation. He underwent generator replacement January 2011.  He is status post AV junction ablation. He has had intercurrent normalization of LV systolic function.  November 2010 he had syncope with appropriate DF therapy.    he has some lightheadedness with standing but no syncope  His lisinopril was decreased 10>>5   Past Medical History  Diagnosis Date  . COPD (chronic obstructive pulmonary disease)     GOLD II; Spirometry 07/10/2008 >FEV1 1.46 56% predicted, ratio of 66%  . Anemia     iron deficiency anemia with previous severe GI bleed   . Hypertension   . Hyperlipidemia   . Left bundle branch block   . Prostatic hypertrophy 09-11-97    Benign  . Obesity   . CHF (congestive heart failure)     secondary to nonischemic cardiomyopathy; ECHO 10/07 EF 20-25%, mod to severe MR; ECHO 1/10 EF 55-60%, mild MR; ECHO 2/11 55-65%, grade 1 diast dysfxn, miod to mod LAE, mild TR;    S/P Medtronic BiV ICD with biventricular function now turned off due to diahragmatic simulation  . CAD (coronary artery disease)     Mild, nonobstructive (LHC 1/07: mLAD 20%, pCFX 20-30%, mRCA 30%, EF 25%)  . Atrial fibrillation     s/p AV node ablation; not on coumadin due to GIB  . Cardiac arrest - ventricular fibrillation 02/2008    Aborted, shocked by ICD  . Dyspnea     exertional  . Pneumonia     01/2011  . Diabetes mellitus     type 2 NIDDM x 5-6 yrs  . GERD (gastroesophageal reflux disease)   . Cancer     prostate  . Arthritis     back and knees  . 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 Inguinal hernia     left; asymptomatic    Past Surgical History  Procedure  Laterality Date  . Lobectomy  01-21-98    lung  . Rotator cuff repair  1994 and 1995  . Knee surgery  1996    Left  . Cardiac catheterization  01/2006    Showed mild nonobstructive CAD  . Cardiac catheterization  11/05/2006    Right atrial pressure mean of 12, RV pressure 36/8, PA pressure 39/16 witha mean of 28, wedge pressure was 20. Fick cardiac output was 5 liters per minute, cardiac index was 2.4  . Esophagogastroduodenoscopy  09/1997    Prepylor ulcer, esoph ring, duod avm  . Colonoscopy w/ polypectomy  11/1997    Divertics, int hemms  . Mch gi bleed  02/07-02/12/2002  . Esophagogastroduodenoscopy  05/22/2002    Poss Barrett's   . Colonoscopy  06/26/2002    Multip (neg) divertics, int hemm  . Colonoscopy  07/10/2004    Poyps, divertics, int hemms  . Mch sob  10/11-10/18/2007    A fib, CHF  . Esophagogastroduodenoscopy  02/19/2006    HH No active bleeding  . Ct head limited w/o cm  10/03/2006    No acute abnmlty  . Coronary angioplasty      Min abstrut zd severe LB dystn EF 25%  . Mch x  11/08-11/03/2006  Acute blood loss, anemia, sys HF, isch cardiomyopathy  . Hosp  8/14-8/23/2008    Acute on chronis CHF IIIB NOnisch Cardiomyop EF 20-25% Mod-Sev MR  . Colonoscopy  06/19/2008    2 polyps divertics int hemms (Dr Marina Goodell)  . Insert / replace / remove pacemaker  2007  . Eye surgery      cataract, bilateral  . Anterior cervical discectomy  02/2010  . Knee arthroscopy  1988  . Lumbar laminectomy/decompression microdiscectomy  04/29/2011    Procedure: LUMBAR LAMINECTOMY/DECOMPRESSION MICRODISCECTOMY;  Surgeon: Tia Alert, MD;  Location: [facility];  Service: Neurosurgery;  Laterality: N/A;  Lumbar Two, Three, Four, Five Decompressive Lumbar Laminectomies    Current Outpatient Prescriptions  Medication Sig Dispense Refill  . ACCU-CHEK AVIVA PLUS test strip USE AND DISCARD 1 TEST     STRIP DAILY AND AS NEEDED  FOR DIABETES  100 strip  3  . acetaminophen (TYLENOL) 500  MG tablet Take 500 mg by mouth every 6 (six) hours as needed.        Marland Kitchen albuterol (PROAIR HFA) 108 (90 BASE) MCG/ACT inhaler INHALE 2 PUFFS INTO THE LUNGS EVERY 6 HOURS AS NEEDED FOR WHEEZING  3 Inhaler  3  . aspirin buffered (ADPRIN B) 325 MG TABS tablet Take 325 mg by mouth daily.        Marland Kitchen atorvastatin (LIPITOR) 20 MG tablet Take 1 tablet (20 mg total) by mouth daily.  90 tablet  3  . carvedilol (COREG) 6.25 MG tablet Take 1 tablet (6.25 mg total) by mouth 2 (two) times daily with a meal.  180 tablet  3  . Cholecalciferol (TH VITAMIN D3) 2000 UNITS CAPS Take by mouth daily.        . colchicine 0.6 MG tablet Take 1 tablet (0.6 mg total) by mouth 2 (two) times daily.  180 tablet  3  . Cyanocobalamin (VITAMIN B-12) 2500 MCG SUBL Place under the tongue daily.        Marland Kitchen dexlansoprazole (DEXILANT) 60 MG capsule Take 60 mg by mouth daily.      Marland Kitchen dutasteride (AVODART) 0.5 MG capsule Take 1 capsule (0.5 mg total) by mouth daily.  90 capsule  3  . eplerenone (INSPRA) 25 MG tablet Take 1 tablet (25 mg total) by mouth daily.  90 tablet  3  . ferrous sulfate 325 (65 FE) MG tablet Take 1 tablet (325 mg total) by mouth daily with breakfast.  90 tablet  3  . furosemide (LASIX) 40 MG tablet Take 1 tablet (40 mg total) by mouth daily.  90 tablet  1  . gabapentin (NEURONTIN) 300 MG capsule Take 300 mg by mouth 4 (four) times daily.      Marland Kitchen guaiFENesin (MUCINEX) 600 MG 12 hr tablet Take 1,200 mg by mouth 2 (two) times daily as needed.       Marland Kitchen lisinopril (PRINIVIL,ZESTRIL) 5 MG tablet Take 1 tablet (5 mg total) by mouth daily.  90 tablet  3  . potassium chloride SA (KLOR-CON M20) 20 MEQ tablet TAKE 1 TABLET TWICE A DAY  180 tablet  3  . Tamsulosin HCl (FLOMAX) 0.4 MG CAPS Take 1 capsule (0.4 mg total) by mouth daily.  90 capsule  3  . tiotropium (SPIRIVA HANDIHALER) 18 MCG inhalation capsule Place 1 capsule (18 mcg total) into inhaler and inhale daily. 1 inhale every morning  90 capsule  3   No current  facility-administered medications for this visit.    No Known Allergies  Review of Systems negative except from HPI and PMH  Physical Exam BP 97/54  Pulse 74  Ht 5\' 7"  (1.702 m)  Wt 192 lb 8 oz (87.317 kg)  BMI 30.14 kg/m2 Well developed and nourished in no acute distress HENT normal Neck supple with JVP-flat Clear Regular rate and rhythm, no murmurs or gallops Abd-soft with active BS No Clubbing cyanosis edema Skin-warm and dry A & Oriented  Grossly normal sensory and motor function  ECG >> Vpacing  Assessment and  Plan

## 2012-08-10 NOTE — Assessment & Plan Note (Signed)
The patient's device was interrogated.  The information was reviewed. No changes were made in the programming.    

## 2012-08-10 NOTE — Assessment & Plan Note (Signed)
The patient has low blood pressures of orthostatic lightheadedness. We'll plan to decrease his heart failure medications especially with normalization of LV function.  Specifically we'll change his eplerenone 25-12 and half, lisinopril 5--2.5, and carvedilol 6.25--3 .125

## 2012-08-10 NOTE — Patient Instructions (Signed)
Your physician has recommended you make the following change in your medication:  1) decrease aspirin to 325 mg one tablet by mouth every other day. 2) decrease coreg (carvedilol) to 3.125 mg one tablet by mouth twice daily. 3) decrease inspra to 25 mg 1/2 tablet by mouth once daily. 4) decrease lisinopril to 2.5 mg one tablet by mouth once daily  Remote monitoring is used to monitor your Pacemaker of ICD from home. This monitoring reduces the number of office visits required to check your device to one time per year. It allows [place] to keep an eye on the functioning of your device to ensure it is working properly. You are scheduled for a device check from home on 11/14/12. You may send your transmission at any time that day. If you have a wireless device, the transmission will be sent automatically. After your physician reviews your transmission, you will receive a postcard with your next transmission date.  Your physician wants you to follow-up in: 6 months with Dr. Graciela Husbands. You will receive a reminder letter in the mail two months in advance. If you don't receive a letter, please call our office to schedule the follow-up appointment.

## 2012-08-10 NOTE — Assessment & Plan Note (Signed)
No intercurrent Ventricular tachycardia  

## 2012-08-10 NOTE — Assessment & Plan Note (Signed)
Permanent. No longer on Coumadin as he had chronic GI bleeding. He was put on aspirin 325-buffered. I've asked him to take this every other day

## 2012-08-22 ENCOUNTER — Other Ambulatory Visit (INDEPENDENT_AMBULATORY_CARE_PROVIDER_SITE_OTHER): Payer: Medicare Other

## 2012-08-22 DIAGNOSIS — E1059 Type 1 diabetes mellitus with other circulatory complications: Secondary | ICD-10-CM | POA: Diagnosis not present

## 2012-08-22 LAB — HEMOGLOBIN A1C: Hgb A1c MFr Bld: 6.7 % — ABNORMAL HIGH (ref 4.6–6.5)

## 2012-09-14 ENCOUNTER — Other Ambulatory Visit: Payer: Self-pay | Admitting: Family Medicine

## 2012-09-20 ENCOUNTER — Telehealth: Payer: Self-pay | Admitting: *Deleted

## 2012-09-20 ENCOUNTER — Other Ambulatory Visit: Payer: Self-pay | Admitting: Family Medicine

## 2012-09-20 NOTE — Telephone Encounter (Signed)
Received refill request from [facility] for Amaryl, per 2/14 labs pt was to d/c med. Please advise?

## 2012-09-21 NOTE — Telephone Encounter (Signed)
Call patient.  It appears this should have been stopped. Is he taking it?

## 2012-09-21 NOTE — Telephone Encounter (Signed)
Spoke with patient and he is not taken medication. Also informed pharmacy patient not taken this medication any more

## 2012-10-12 IMAGING — CT CT L SPINE W/O CM
3 series · 15 of 33 positions shown, 18 images · non-contrast
Comparison: Intraoperative radiographs of 04/29/2011.  Preoperative
CT 12/01/2010.

CLINICAL DATA: Back pain status post lumbar laminectomy 04/29/2011.
History of diabetes and prostate cancer.

CT LUMBAR SPINE WITHOUT CONTRAST
TECHNIQUE: Multidetector CT imaging of the lumbar spine was
performed without intravenous contrast administration. Multiplanar
CT image reconstructions were also generated.

[Series 5: t/l_spine 2.0 b31s st · axial · 0.33mm/px · z∈[-315,-115]mm · 7 of 118 slices shown, 9 images]
[im 10/118  soft-tissue]
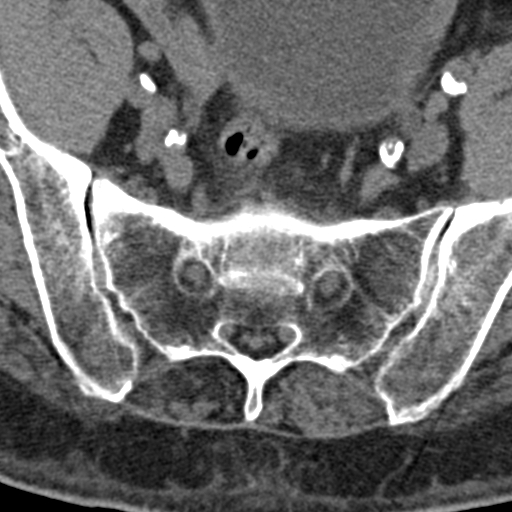
[im 10/118  bone]
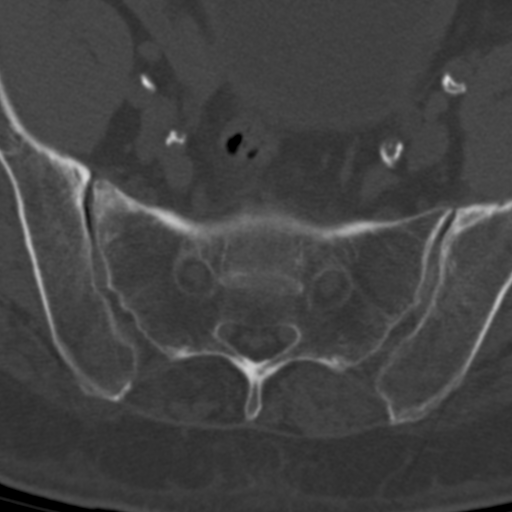
[im 28/118  bone]
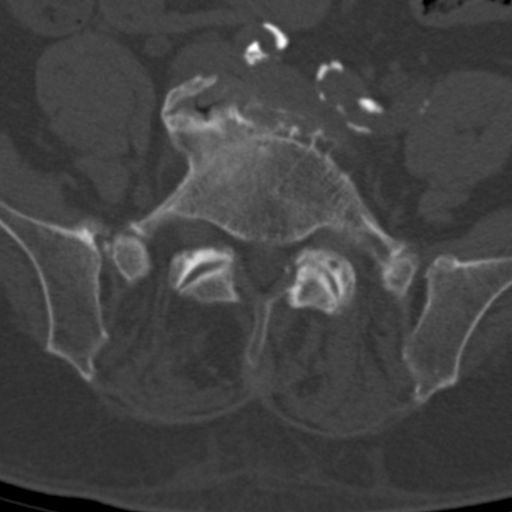
[im 46/118  bone]
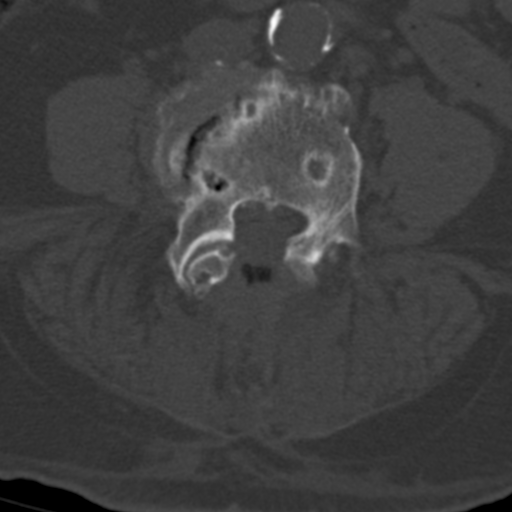
[im 64/118  bone]
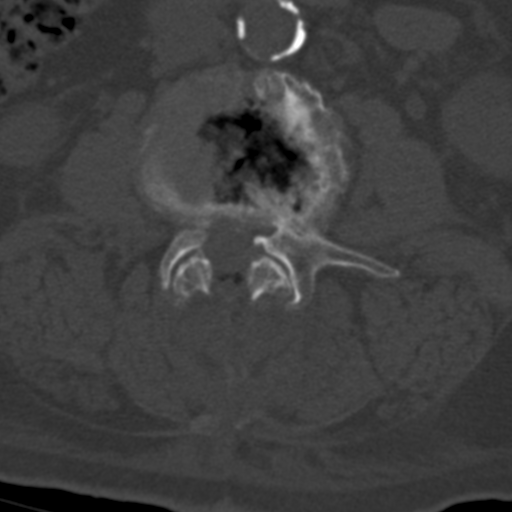
[im 73/118  soft-tissue]
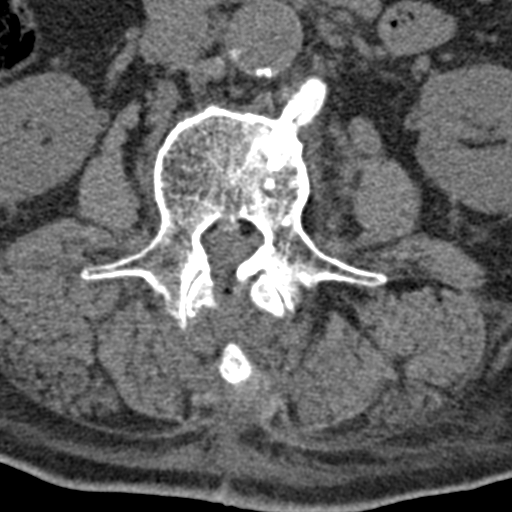
[im 73/118  bone]
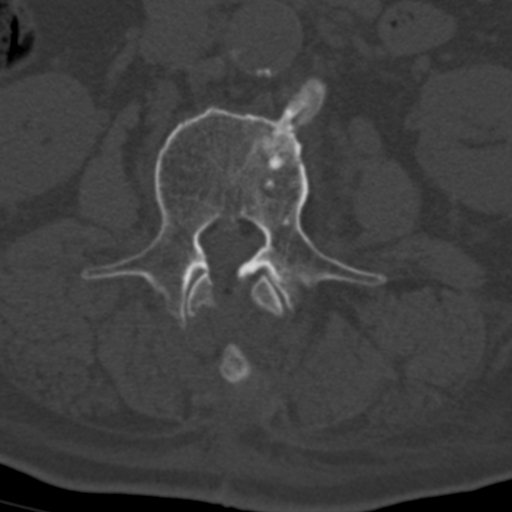
[im 91/118  bone]
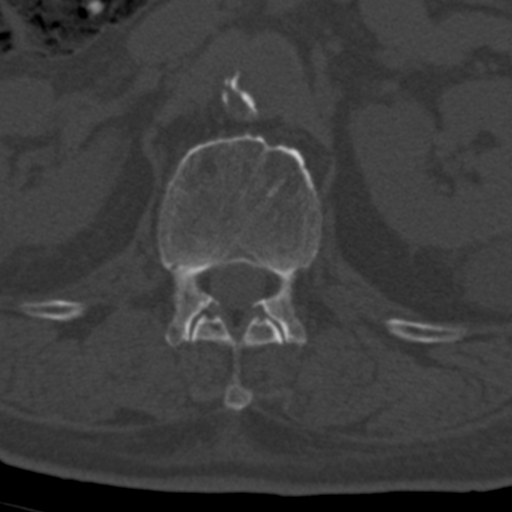
[im 109/118  bone]
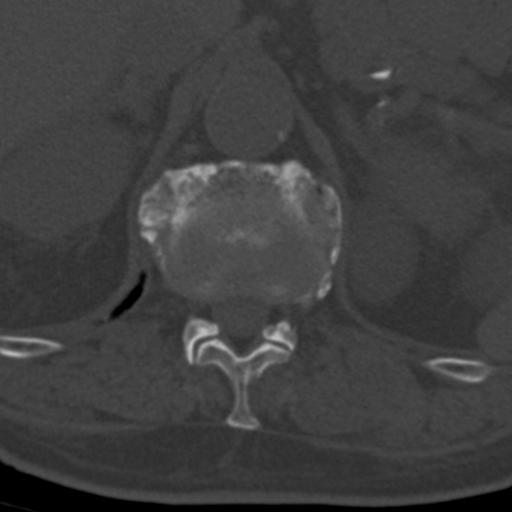

[Series 602: coronal · coronal · 0.46mm/px · 3 of 56 slices shown]
[im 12/56  bone]
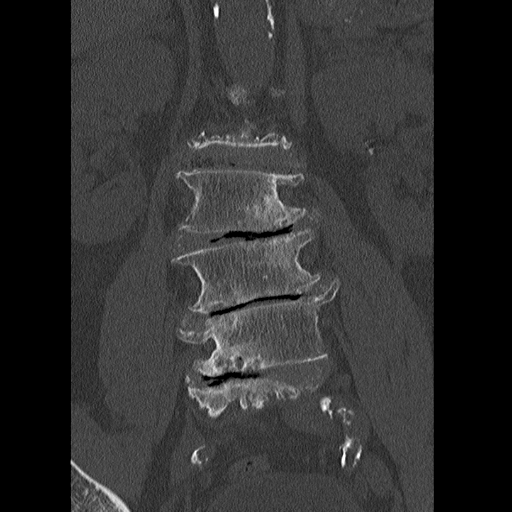
[im 23/56  bone]
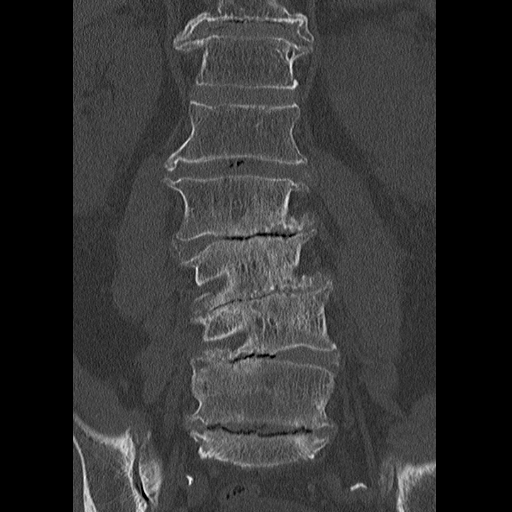
[im 34/56  bone]
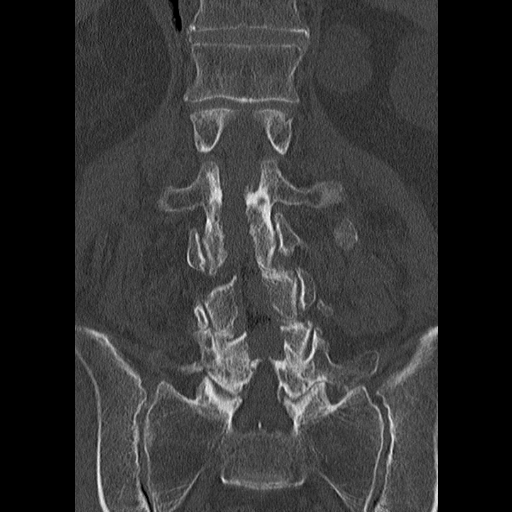

[Series 603: sagittal · sagittal · 0.46mm/px · 5 of 61 slices shown, 6 images]
[im 21/61  bone]
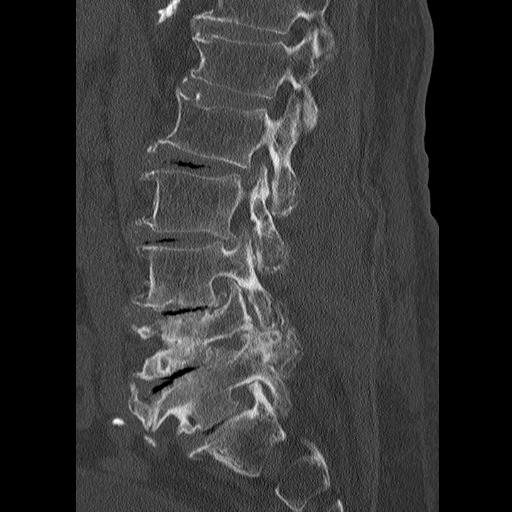
[im 26/61  bone]
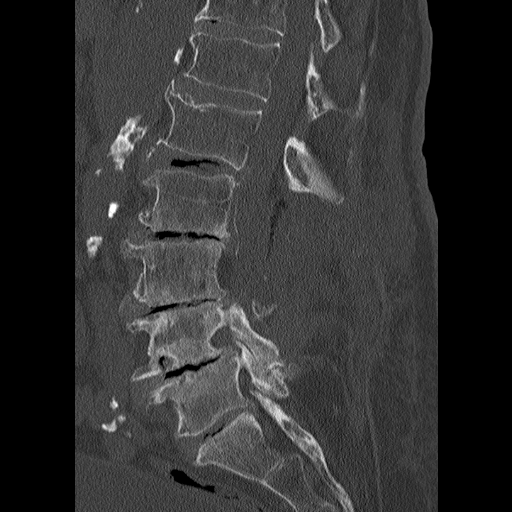
[im 31/61  soft-tissue]
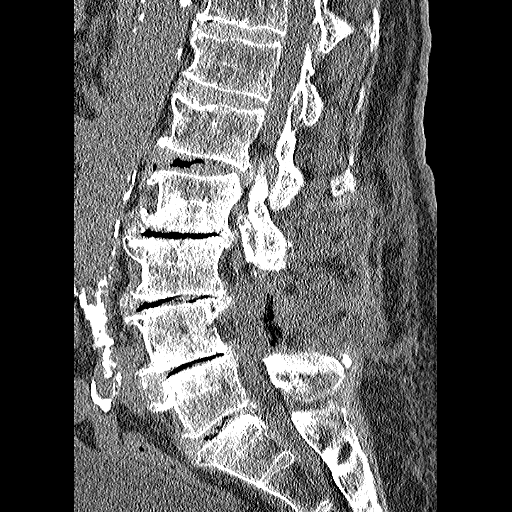
[im 31/61  bone]
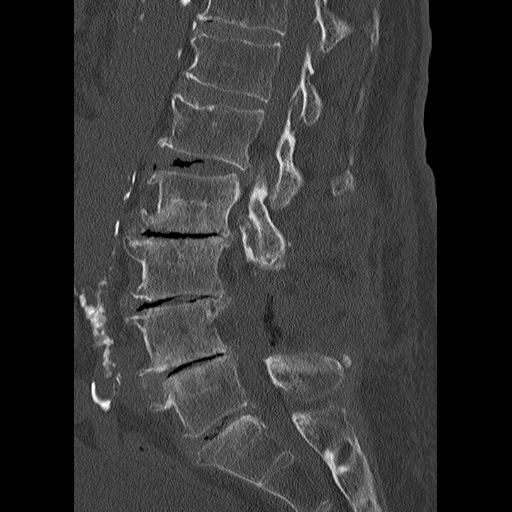
[im 36/61  bone]
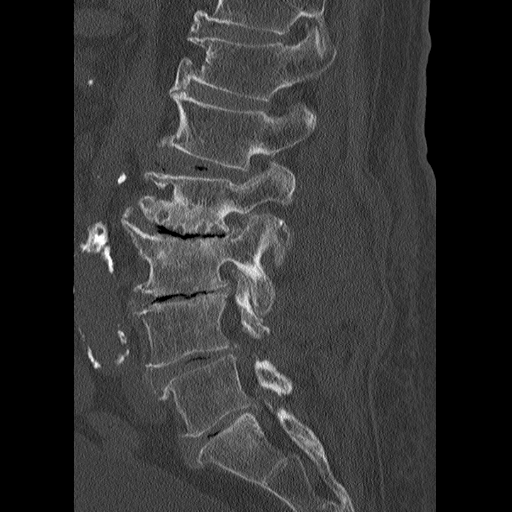
[im 41/61  bone]
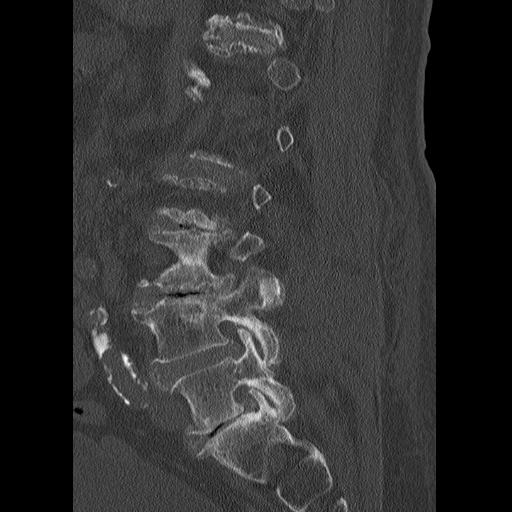

[15 of 33 positions shown; findings below may reference images not displayed]

FINDINGS: Decompressive laminectomies have been performed from L2-
L4 in the interval.  There is expected postsurgical change in the
posterior paraspinal soft tissues with ill-defined fluid.  Air
tracking along the posterior aspect of the dura in the laminectomy
bed is probably due to Gelfoam.

The overall alignment is stable.  There is a scoliosis convex to
the right and L1-L2 and to the left at L4-L5.  There is leftward
listhesis of L4 on L5.  Vacuum phenomenon is present throughout all
of the lumbar disc.

No acute anterior paraspinal abnormalities are identified.  There
are diffuse vascular calcifications, moderate distension of the
urinary bladder and probable simple left renal cyst.

L1-L2:  Stable annular disc bulging with disc desiccation and
calcification.  There is resulting mild to moderate central and
biforaminal stenosis which appears unchanged.

L2-L3:  Interval laminectomy with good decompression of the spinal
canal.  Asymmetric disc bulging and facet hypertrophy on the left
contribute to mild residual left foraminal and left lateral recess
stenosis.  The right foramen appears widely patent.

L3-L4:  Interval laminectomy with good decompression of the spinal
canal. There is moderate biforaminal stenosis due to calcified disc
material, not significantly changed.

L4-L5:  Spinal canal appears adequately decompressed by the
interval laminectomies.  There is advanced facet hypertrophy on the
right with asymmetric disc bulging and osteophytes contributing to
persistent severe right foraminal stenosis.  The left foramen
demonstrates stable mild narrowing.

L5-S1:  Stable annular disc bulging, facet and ligamentous
hypertrophy contributing to mild central and biforaminal stenosis.
IMPRESSION: 1.  Interval L2-L4 laminectomy without demonstrated complication.
There is presumed Gelfoam within the laminectomy bed.  No
suspicious fluid collection is identified.
2.  Stable alignment.  No evidence of acute fracture.
3.  The spinal canal is adequately decompressed at the operative
levels.  There is residual foraminal narrowing as detailed above.
4.  No definite acute findings.

## 2012-10-23 ENCOUNTER — Other Ambulatory Visit: Payer: Self-pay | Admitting: Family Medicine

## 2012-10-28 ENCOUNTER — Other Ambulatory Visit: Payer: Self-pay | Admitting: Family Medicine

## 2012-11-09 DIAGNOSIS — C61 Malignant neoplasm of prostate: Secondary | ICD-10-CM | POA: Diagnosis not present

## 2012-11-14 ENCOUNTER — Ambulatory Visit (INDEPENDENT_AMBULATORY_CARE_PROVIDER_SITE_OTHER): Payer: Medicare Other | Admitting: *Deleted

## 2012-11-14 DIAGNOSIS — I5032 Chronic diastolic (congestive) heart failure: Secondary | ICD-10-CM | POA: Diagnosis not present

## 2012-11-14 DIAGNOSIS — I4901 Ventricular fibrillation: Secondary | ICD-10-CM | POA: Diagnosis not present

## 2012-11-14 DIAGNOSIS — Z95810 Presence of automatic (implantable) cardiac defibrillator: Secondary | ICD-10-CM

## 2012-11-14 DIAGNOSIS — N401 Enlarged prostate with lower urinary tract symptoms: Secondary | ICD-10-CM | POA: Diagnosis not present

## 2012-11-14 DIAGNOSIS — C61 Malignant neoplasm of prostate: Secondary | ICD-10-CM | POA: Diagnosis not present

## 2012-11-21 DIAGNOSIS — L723 Sebaceous cyst: Secondary | ICD-10-CM | POA: Diagnosis not present

## 2012-11-21 DIAGNOSIS — L570 Actinic keratosis: Secondary | ICD-10-CM | POA: Diagnosis not present

## 2012-11-21 DIAGNOSIS — L821 Other seborrheic keratosis: Secondary | ICD-10-CM | POA: Diagnosis not present

## 2012-11-21 DIAGNOSIS — D239 Other benign neoplasm of skin, unspecified: Secondary | ICD-10-CM | POA: Diagnosis not present

## 2012-11-21 LAB — REMOTE ICD DEVICE
AL AMPLITUDE: 0.6 mv
ATRIAL PACING ICD: 0 pct
BRDY-0002LV: 70 {beats}/min
BRDY-0004LV: 120 {beats}/min
CHARGE TIME: 12.572 s
FVT: 0
LV LEAD IMPEDENCE ICD: 627 Ohm
RV LEAD IMPEDENCE ICD: 646 Ohm
TOT-0001: 1
TZAT-0001ATACH: 1
TZAT-0001ATACH: 2
TZAT-0001SLOWVT: 1
TZAT-0001SLOWVT: 2
TZAT-0002FASTVT: NEGATIVE
TZAT-0004ATACH: 15
TZAT-0004SLOWVT: 8
TZAT-0004SLOWVT: 8
TZAT-0005SLOWVT: 88 pct
TZAT-0005SLOWVT: 91 pct
TZAT-0011ATACH: 10 ms
TZAT-0011SLOWVT: 10 ms
TZAT-0011SLOWVT: 10 ms
TZAT-0012ATACH: 150 ms
TZAT-0012ATACH: 150 ms
TZAT-0012SLOWVT: 170 ms
TZAT-0013SLOWVT: 2
TZAT-0013SLOWVT: 2
TZAT-0018ATACH: NEGATIVE
TZAT-0018FASTVT: NEGATIVE
TZAT-0018SLOWVT: NEGATIVE
TZAT-0018SLOWVT: NEGATIVE
TZAT-0019ATACH: 6 V
TZAT-0019FASTVT: 8 V
TZAT-0020ATACH: 1.5 ms
TZAT-0020ATACH: 1.5 ms
TZAT-0020FASTVT: 1.5 ms
TZON-0003ATACH: 350 ms
TZON-0003SLOWVT: 350 ms
TZON-0004SLOWVT: 28
TZON-0004VSLOWVT: 20
TZON-0005SLOWVT: 12
TZST-0001FASTVT: 2
TZST-0001FASTVT: 3
TZST-0001FASTVT: 6
TZST-0001SLOWVT: 3
TZST-0001SLOWVT: 4
TZST-0001SLOWVT: 6
TZST-0002ATACH: NEGATIVE
TZST-0002FASTVT: NEGATIVE
TZST-0002FASTVT: NEGATIVE
TZST-0002FASTVT: NEGATIVE
TZST-0003SLOWVT: 35 J
TZST-0003SLOWVT: 35 J
VENTRICULAR PACING ICD: 99.34 pct

## 2012-11-28 ENCOUNTER — Other Ambulatory Visit: Payer: Self-pay | Admitting: Family Medicine

## 2012-11-28 NOTE — Telephone Encounter (Signed)
Ok to fill 

## 2012-11-29 ENCOUNTER — Other Ambulatory Visit: Payer: Self-pay | Admitting: Family Medicine

## 2012-11-29 NOTE — Telephone Encounter (Signed)
Okay to refill carvedilol... eplerenone refill needs to go to cardiolog.

## 2012-11-29 NOTE — Telephone Encounter (Signed)
This should really be filled by his cardiologist as they are managing it. Please send request there.

## 2012-11-30 ENCOUNTER — Other Ambulatory Visit: Payer: Self-pay | Admitting: *Deleted

## 2012-11-30 NOTE — Telephone Encounter (Signed)
Pt's wife was seen by Dr. Milinda Antis today and daughter advise me that pt has been out of his pro air for a while, daughter said they requested the Rx and was told that "Dr. Daphine Deutscher assistant wasn't here and no one was covering that spot" yesterday. Pt needs a refill on his pro air and his daughter said he needs refills on his other meds too but she didn't have them available

## 2012-12-01 ENCOUNTER — Ambulatory Visit (INDEPENDENT_AMBULATORY_CARE_PROVIDER_SITE_OTHER): Payer: Medicare Other | Admitting: Internal Medicine

## 2012-12-01 ENCOUNTER — Encounter: Payer: Self-pay | Admitting: Internal Medicine

## 2012-12-01 VITALS — BP 134/73 | HR 70 | Ht 67.0 in | Wt 188.0 lb

## 2012-12-01 DIAGNOSIS — Z95810 Presence of automatic (implantable) cardiac defibrillator: Secondary | ICD-10-CM

## 2012-12-01 DIAGNOSIS — I472 Ventricular tachycardia: Secondary | ICD-10-CM

## 2012-12-01 DIAGNOSIS — I4891 Unspecified atrial fibrillation: Secondary | ICD-10-CM

## 2012-12-01 MED ORDER — ALBUTEROL SULFATE HFA 108 (90 BASE) MCG/ACT IN AERS: INHALATION_SPRAY | RESPIRATORY_TRACT | Status: DC

## 2012-12-01 NOTE — Patient Instructions (Signed)
Call Warnell Forester RN to schedule device change out  [phone] [facility] Office)

## 2012-12-01 NOTE — Assessment & Plan Note (Signed)
No recurrent vt 

## 2012-12-01 NOTE — Telephone Encounter (Signed)
Okay to refill albuterol. Have him let pharmacy know what other meds need refills.

## 2012-12-01 NOTE — Progress Notes (Signed)
Patient Care Team: Excell Seltzer, MD as PCP - General (Family Medicine) Dolores Patty, MD (Cardiology) Dolores Patty, MD (Cardiology) Duke Salvia, MD (Cardiology)   HPI  [name] is a 79 y.o. male  Seen in followup for CRT-D implantation in the setting of nonischemic myopathy and atrial fibrillation. Previously he was on warfarin. This was discontinued because of GI bleeding and he has been treated with aspirin.   His device has reacehd ERI   [place]. He may have cracked a concrete. They say he is hard eadedd He underwent generator replacement January 2011.  He is status post AV junction ablation. He has had intercurrent normalization of LV systolic function.  November 2010 he had syncope with appropriate VF therapy For polymorphic ventricular tachycardia he has some lightheadedness with standing but no syncope His lisinopril was decreased 10>>5   Past Medical History  Diagnosis Date  . COPD (chronic obstructive pulmonary disease)     GOLD II; Spirometry 07/10/2008 >FEV1 1.46 56% predicted, ratio of 66%  . Anemia     iron deficiency anemia with previous severe GI bleed   . Hypertension   . Hyperlipidemia   . Left bundle branch block   . Prostatic hypertrophy 09-11-97    Benign  . Obesity   . CHF (congestive heart failure)     secondary to nonischemic cardiomyopathy; ECHO 10/07 EF 20-25%, mod to severe MR; ECHO 1/10 EF 55-60%, mild MR; ECHO 2/11 55-65%, grade 1 diast dysfxn, miod to mod LAE, mild TR;    S/P Medtronic BiV ICD with biventricular function now turned off due to diahragmatic simulation  . CAD (coronary artery disease)     Mild, nonobstructive (LHC 1/07: mLAD 20%, pCFX 20-30%, mRCA 30%, EF 25%)  . Atrial fibrillation     s/p AV node ablation; not on coumadin due to GIB  . Cardiac arrest - ventricular fibrillation 02/2008    Aborted, shocked by ICD  . Dyspnea     exertional  . Pneumonia     01/2011  .  Diabetes mellitus     type 2 NIDDM x 5-6 yrs  . GERD (gastroesophageal reflux disease)   . Cancer     prostate  . Arthritis     back and knees  . 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 Inguinal hernia     left; asymptomatic    Past Surgical History  Procedure Laterality Date  . Lobectomy  01-21-98    lung  . Rotator cuff repair  1994 and 1995  . Knee surgery  1996    Left  . Cardiac catheterization  01/2006    Showed mild nonobstructive CAD  . Cardiac catheterization  11/05/2006    Right atrial pressure mean of 12, RV pressure 36/8, PA pressure 39/16 witha mean of 28, wedge pressure was 20. Fick cardiac output was 5 liters per minute, cardiac index was 2.4  . Esophagogastroduodenoscopy  09/1997    Prepylor ulcer, esoph ring, duod avm  . Colonoscopy w/ polypectomy  11/1997    Divertics, int hemms  . Mch gi bleed  02/07-02/12/2002  . Esophagogastroduodenoscopy  05/22/2002    Poss Barrett's   . Colonoscopy  06/26/2002    Multip (neg) divertics, int hemm  . Colonoscopy  07/10/2004    Poyps, divertics, int hemms  . Mch sob  10/11-10/18/2007    A fib, CHF  .  Esophagogastroduodenoscopy  02/19/2006    HH No active bleeding  . Ct head limited w/o cm  10/03/2006    No acute abnmlty  . Coronary angioplasty      Min abstrut zd severe LB dystn EF 25%  . Mch x  11/08-11/03/2006    Acute blood loss, anemia, sys HF, isch cardiomyopathy  . Hosp  8/14-8/23/2008    Acute on chronis CHF IIIB NOnisch Cardiomyop EF 20-25% Mod-Sev MR  . Colonoscopy  06/19/2008    2 polyps divertics int hemms (Dr Marina Goodell)  . Insert / replace / remove pacemaker  2007  . Eye surgery      cataract, bilateral  . Anterior cervical discectomy  02/2010  . Knee arthroscopy  1988  . Lumbar laminectomy/decompression microdiscectomy  04/29/2011    Procedure: LUMBAR LAMINECTOMY/DECOMPRESSION MICRODISCECTOMY;  Surgeon: Tia Alert, MD;  Location: [facility];  Service: Neurosurgery;  Laterality: N/A;   Lumbar Two, Three, Four, Five Decompressive Lumbar Laminectomies    Current Outpatient Prescriptions  Medication Sig Dispense Refill  . ACCU-CHEK AVIVA PLUS test strip USE AND DISCARD 1 TEST     STRIP DAILY AND AS NEEDED  FOR DIABETES  100 strip  3  . acetaminophen (TYLENOL) 500 MG tablet Take 500 mg by mouth every 6 (six) hours as needed.        Marland Kitchen albuterol (PROAIR HFA) 108 (90 BASE) MCG/ACT inhaler INHALE 2 PUFFS INTO THE LUNGS EVERY 6 HOURS AS NEEDED FOR WHEEZING  3 Inhaler  1  . aspirin buffered (ADPRIN B) 325 MG TABS tablet Take one tablet by mouth every other day      . atorvastatin (LIPITOR) 20 MG tablet TAKE 1 TABLET DAILY AT     BEDTIME  90 tablet  3  . carvedilol (COREG) 3.125 MG tablet Take 1 tablet (3.125 mg total) by mouth 2 (two) times daily with a meal.  180 tablet  3  . Cholecalciferol (TH VITAMIN D3) 2000 UNITS CAPS Take by mouth daily.        . colchicine 0.6 MG tablet Take 1 tablet (0.6 mg total) by mouth 2 (two) times daily.  180 tablet  3  . Cyanocobalamin (VITAMIN B-12) 2500 MCG SUBL Place under the tongue daily.        Marland Kitchen dexlansoprazole (DEXILANT) 60 MG capsule Take 60 mg by mouth daily.      Marland Kitchen dutasteride (AVODART) 0.5 MG capsule Take 1 capsule (0.5 mg total) by mouth daily.  90 capsule  3  . eplerenone (INSPRA) 25 MG tablet Take 1/2 tablet by mouth once daily  90 tablet  2  . ferrous sulfate 325 (65 FE) MG tablet Take 1 tablet (325 mg total) by mouth daily with breakfast.  90 tablet  3  . furosemide (LASIX) 40 MG tablet TAKE 1 TABLET DAILY  90 tablet  1  . gabapentin (NEURONTIN) 300 MG capsule Take 300 mg by mouth 4 (four) times daily.      Marland Kitchen guaiFENesin (MUCINEX) 600 MG 12 hr tablet Take 1,200 mg by mouth 2 (two) times daily as needed.       Marland Kitchen KLOR-CON M20 20 MEQ tablet TAKE 1 TABLET TWICE A DAY  180 tablet  3  . lisinopril (PRINIVIL,ZESTRIL) 2.5 MG tablet Take 1 tablet (2.5 mg total) by mouth daily.  90 tablet  3  . SPIRIVA HANDIHALER 18 MCG inhalation capsule INHALE  THE CONTENTS OF ONE CAPSULE DAILY IN THE       MORNING VIA  HANDIHALER     DEVICE  90 capsule  3  . Tamsulosin HCl (FLOMAX) 0.4 MG CAPS Take 1 capsule (0.4 mg total) by mouth daily.  90 capsule  3   No current facility-administered medications for this visit.    No Known Allergies  Review of Systems negative except from HPI and PMH  Physical Exam BP 134/73  Pulse 70  Ht 5\' 7"  (1.702 m)  Wt 188 lb (85.276 kg)  BMI 29.44 kg/m2 Well developed and well nourished in no acute distress HENT normal E scleral and icterus clear Neck Supple JVP flat; carotids brisk and full Clear to ausculation Device pocket well healed; without hematoma or erythema.  There is no tethering Regular rate and rhythm, no murmurs gallops or rub Soft with active bowel sounds No clubbing cyanosis none Edema Alert and oriented, grossly normal motor and sensory function Skin Warm and Dry    Assessment and  Plan

## 2012-12-01 NOTE — Assessment & Plan Note (Signed)
afib permanent;  Not on coumadin 2/2 gibleed Will change asa to 81 daily

## 2012-12-01 NOTE — Assessment & Plan Note (Signed)
Has reached eri  We have reviewed the benefits and risks of generator replacement.  These include but are not limited to lead fracture and infection.  The patient understands, agrees and is willing to proceed. Given the fact that there has been recent therapy for polymorphic ventricular tachycardia, he would like to receive a defibrillator not withstanding his age

## 2012-12-01 NOTE — Telephone Encounter (Signed)
Spoke with daughter and advised her that refills have been sent to pharmacy and asked her to have pharmacy fax other refill requests.

## 2012-12-22 ENCOUNTER — Other Ambulatory Visit: Payer: Self-pay | Admitting: Family Medicine

## 2013-01-09 ENCOUNTER — Telehealth: Payer: Self-pay | Admitting: Internal Medicine

## 2013-01-09 NOTE — Telephone Encounter (Signed)
Pt to call to have battery change out, ready to schedule asap

## 2013-01-09 NOTE — Telephone Encounter (Signed)
Returned call to patient no answer.Left message on personal voice mail Dr.Klein out of office today will send message to [facility] Dr.Klein's nurse.

## 2013-01-11 NOTE — Telephone Encounter (Signed)
Follow up:  Pt states he is returning the nurses call from Monday.

## 2013-01-12 ENCOUNTER — Telehealth: Payer: Self-pay | Admitting: *Deleted

## 2013-01-12 ENCOUNTER — Encounter: Payer: Self-pay | Admitting: *Deleted

## 2013-01-12 ENCOUNTER — Other Ambulatory Visit: Payer: Self-pay | Admitting: *Deleted

## 2013-01-12 DIAGNOSIS — I5022 Chronic systolic (congestive) heart failure: Secondary | ICD-10-CM

## 2013-01-12 NOTE — Telephone Encounter (Signed)
Called patient to set up CRT-D generator change procedure - scheduled 01/24/2013. Pre-procedure labs scheduled for 01/18/2013. Discussed procedure and instructions with patient. Patient verbalized understanding and agreeable to plan.

## 2013-01-13 ENCOUNTER — Other Ambulatory Visit: Payer: Self-pay | Admitting: *Deleted

## 2013-01-13 MED ORDER — GABAPENTIN 300 MG PO CAPS: 300.0000 mg | ORAL_CAPSULE | Freq: Four times a day (QID) | ORAL | Status: DC

## 2013-01-17 NOTE — Telephone Encounter (Signed)
New Problem:  Pt's daughter states her add's device keeps beeping. She states she was told that means something is wrong. Pt's daughter states she would like someone to call her back.

## 2013-01-17 NOTE — Telephone Encounter (Signed)
Spoke w/daughter---device beeping due to reaching RRT. Pt is scheduled for gen change. Daughter was offered to bring pt by so beep could be turned off but declined.

## 2013-01-18 ENCOUNTER — Ambulatory Visit (INDEPENDENT_AMBULATORY_CARE_PROVIDER_SITE_OTHER): Payer: Medicare Other | Admitting: *Deleted

## 2013-01-18 ENCOUNTER — Encounter (INDEPENDENT_AMBULATORY_CARE_PROVIDER_SITE_OTHER): Payer: Self-pay

## 2013-01-18 DIAGNOSIS — I5022 Chronic systolic (congestive) heart failure: Secondary | ICD-10-CM | POA: Diagnosis not present

## 2013-01-18 LAB — BASIC METABOLIC PANEL
CO2: 25 mEq/L (ref 19–32)
Calcium: 8.9 mg/dL (ref 8.4–10.5)
Creatinine, Ser: 1 mg/dL (ref 0.4–1.5)
GFR: 73.82 mL/min (ref >60.00)
Sodium: 141 mEq/L (ref 135–145)

## 2013-01-18 LAB — CBC WITH DIFFERENTIAL/PLATELET
Basophils Relative: 0.4 % (ref 0.0–3.0)
Eosinophils Relative: 3.8 % (ref 0.0–5.0)
Hemoglobin: 12.6 g/dL — ABNORMAL LOW (ref 13.0–17.0)
Lymphocytes Relative: 20 % (ref 12.0–46.0)
MCHC: 34 g/dL (ref 30.0–36.0)
Monocytes Relative: 12.9 % — ABNORMAL HIGH (ref 3.0–12.0)
Neutro Abs: 4 10*3/uL (ref 1.4–7.7)
Neutrophils Relative %: 62.9 % (ref 43.0–77.0)
RBC: 4.05 Mil/uL — ABNORMAL LOW (ref 4.22–5.81)

## 2013-01-19 ENCOUNTER — Telehealth: Payer: Self-pay | Admitting: Family Medicine

## 2013-01-19 NOTE — Telephone Encounter (Signed)
Pt dropped off Handicap form. I put it in your inbox on your desk. Pt's daughter Monika Salk would like to be contacted when ready for pick up.

## 2013-01-19 NOTE — Telephone Encounter (Signed)
error 

## 2013-01-20 ENCOUNTER — Encounter: Payer: Self-pay | Admitting: Family Medicine

## 2013-01-20 ENCOUNTER — Ambulatory Visit (INDEPENDENT_AMBULATORY_CARE_PROVIDER_SITE_OTHER): Payer: Medicare Other | Admitting: Family Medicine

## 2013-01-20 VITALS — BP 110/60 | HR 75 | Temp 98.1°F | Ht 67.0 in | Wt 190.8 lb

## 2013-01-20 DIAGNOSIS — B029 Zoster without complications: Secondary | ICD-10-CM

## 2013-01-20 DIAGNOSIS — Z23 Encounter for immunization: Secondary | ICD-10-CM

## 2013-01-20 MED ORDER — VALACYCLOVIR HCL 1 G PO TABS: 1000.0000 mg | ORAL_TABLET | Freq: Three times a day (TID) | ORAL | Status: DC

## 2013-01-20 MED ORDER — TRAMADOL HCL 50 MG PO TABS: 50.0000 mg | ORAL_TABLET | Freq: Three times a day (TID) | ORAL | Status: DC | PRN

## 2013-01-20 NOTE — Assessment & Plan Note (Signed)
Antiviral and pain control.

## 2013-01-20 NOTE — Patient Instructions (Addendum)
Use tylenol for pain.. Call if pain not controlled with this. Start valacyclovir. Use tramadol as needed for breakthrough pain. Expect the rash to scab over in next 7-10 days. While lesions are not scabbed you are contagious so avoid pregnant women, children and elderly, immunocompromised.

## 2013-01-20 NOTE — Progress Notes (Signed)
  Subjective:    Patient ID: [name], male    DOB: April 26, 1930, 79 y.o.   MRN: [id number]  HPI  79 year old male presents with rash on back, very painful . He feels like he has had pain in that area for several weeks. Daughter not sure if this was instead fro rib pain.  Pain and rash started in last 1-2 days. Significant pain last night 4/10  Irritating and burning.  Sensitive to touch.  Tylenol for pain... helped   No fever.  Review of Systems  Constitutional: Negative for fever and fatigue.  HENT: Negative for ear pain.   Eyes: Negative for pain.  Respiratory: Negative for shortness of breath.   Cardiovascular: Negative for chest pain.       Objective:   Physical Exam  Constitutional: Vital signs are normal. He appears well-developed and well-nourished.  HENT:  Head: Normocephalic.  Right Ear: Hearing normal.  Left Ear: Hearing normal.  Nose: Nose normal.  Mouth/Throat: Oropharynx is clear and moist and mucous membranes are normal.  Neck: Trachea normal. Carotid bruit is not present. No mass and no thyromegaly present.  Cardiovascular: Normal rate, regular rhythm and normal pulses.  Exam reveals no gallop, no distant heart sounds and no friction rub.   No murmur heard. No peripheral edema  Pulmonary/Chest: Effort normal and breath sounds normal. No respiratory distress.  Skin: Skin is warm, dry and intact. No rash noted.  Blistering rash on left flank with erythematous rash, painful to touch  Psychiatric: He has a normal mood and affect. His speech is normal and behavior is normal. Thought content normal.          Assessment & Plan:

## 2013-01-23 ENCOUNTER — Telehealth: Payer: Self-pay | Admitting: Internal Medicine

## 2013-01-23 ENCOUNTER — Telehealth: Payer: Self-pay | Admitting: Family Medicine

## 2013-01-23 NOTE — Telephone Encounter (Signed)
Pt requests a second Handicapped license plate.  Best number to call when form completed is [phone]

## 2013-01-23 NOTE — Telephone Encounter (Signed)
Explained that we would need to reschedule procedure per Dr. Graciela Husbands. I will call this afternoon, after clinic, to set a new date for procedure. Patient's daughter agreeable to plan.

## 2013-01-23 NOTE — Telephone Encounter (Signed)
New Problem      Pt's daughter called to report pt has Shingles on the lower left side of  his back.   Wants to know if he should keep his appt to have battery changed out 10/14 w/[facility].   PcP  adv'd can still have procedure.  Pt was also given meds.   Please give them a call back.    Thank you!

## 2013-01-23 NOTE — Telephone Encounter (Signed)
Placed in Dr. Bedsole's in box for completion. 

## 2013-01-24 ENCOUNTER — Ambulatory Visit (HOSPITAL_COMMUNITY): Admission: RE | Admit: 2013-01-24 | Payer: Medicare Other | Source: Ambulatory Visit | Admitting: Internal Medicine

## 2013-01-24 ENCOUNTER — Encounter (HOSPITAL_COMMUNITY): Admission: RE | Payer: Self-pay | Source: Ambulatory Visit

## 2013-01-24 SURGERY — BIV ICD GENERTAOR CHANGE OUT
Anesthesia: LOCAL

## 2013-01-25 NOTE — Telephone Encounter (Signed)
Will call patient tomorrow to discuss procedure and instructions. Patient agreeable to plan

## 2013-01-26 ENCOUNTER — Other Ambulatory Visit: Payer: Self-pay

## 2013-01-26 DIAGNOSIS — E785 Hyperlipidemia, unspecified: Secondary | ICD-10-CM

## 2013-01-26 DIAGNOSIS — J449 Chronic obstructive pulmonary disease, unspecified: Secondary | ICD-10-CM

## 2013-01-26 DIAGNOSIS — E119 Type 2 diabetes mellitus without complications: Secondary | ICD-10-CM

## 2013-01-26 NOTE — Telephone Encounter (Signed)
Confirmed procedure date with patient and discussed instructions. No labs due again per Dr. Graciela Husbands. Patient verbalized understanding.

## 2013-01-26 NOTE — Telephone Encounter (Signed)
[name] request refill for colcrys to [facility].Please advise.

## 2013-01-27 MED ORDER — COLCHICINE 0.6 MG PO TABS: 0.6000 mg | ORAL_TABLET | Freq: Two times a day (BID) | ORAL | Status: DC

## 2013-01-30 MED ORDER — COLCHICINE 0.6 MG PO TABS: 0.6000 mg | ORAL_TABLET | Freq: Two times a day (BID) | ORAL | Status: DC

## 2013-01-30 NOTE — Addendum Note (Signed)
Addended by: Damita Lack on: 01/30/2013 09:17 AM   Modules accepted: Orders

## 2013-02-08 ENCOUNTER — Ambulatory Visit (HOSPITAL_COMMUNITY)
Admission: RE | Admit: 2013-02-08 | Discharge: 2013-02-08 | Disposition: A | Discharge status: Home / Self Care | Payer: Medicare Other | Source: Ambulatory Visit | Attending: Internal Medicine | Admitting: Internal Medicine

## 2013-02-08 ENCOUNTER — Encounter (HOSPITAL_COMMUNITY)
Admission: RE | Disposition: A | Discharge status: Home / Self Care | Payer: Self-pay | Source: Ambulatory Visit | Attending: Internal Medicine

## 2013-02-08 ENCOUNTER — Encounter (HOSPITAL_COMMUNITY): Payer: Self-pay | Admitting: Pharmacy Technician

## 2013-02-08 DIAGNOSIS — Z01812 Encounter for preprocedural laboratory examination: Secondary | ICD-10-CM | POA: Diagnosis not present

## 2013-02-08 DIAGNOSIS — I4891 Unspecified atrial fibrillation: Secondary | ICD-10-CM | POA: Diagnosis not present

## 2013-02-08 DIAGNOSIS — Z8546 Personal history of malignant neoplasm of prostate: Secondary | ICD-10-CM | POA: Insufficient documentation

## 2013-02-08 DIAGNOSIS — Z4502 Encounter for adjustment and management of automatic implantable cardiac defibrillator: Secondary | ICD-10-CM | POA: Diagnosis not present

## 2013-02-08 DIAGNOSIS — Z8674 Personal history of sudden cardiac arrest: Secondary | ICD-10-CM | POA: Diagnosis not present

## 2013-02-08 DIAGNOSIS — N400 Enlarged prostate without lower urinary tract symptoms: Secondary | ICD-10-CM | POA: Insufficient documentation

## 2013-02-08 DIAGNOSIS — Z01810 Encounter for preprocedural cardiovascular examination: Secondary | ICD-10-CM | POA: Diagnosis not present

## 2013-02-08 DIAGNOSIS — I509 Heart failure, unspecified: Secondary | ICD-10-CM | POA: Diagnosis not present

## 2013-02-08 DIAGNOSIS — I447 Left bundle-branch block, unspecified: Secondary | ICD-10-CM | POA: Insufficient documentation

## 2013-02-08 DIAGNOSIS — K4090 Unilateral inguinal hernia, without obstruction or gangrene, not specified as recurrent: Secondary | ICD-10-CM | POA: Diagnosis not present

## 2013-02-08 DIAGNOSIS — E785 Hyperlipidemia, unspecified: Secondary | ICD-10-CM | POA: Diagnosis not present

## 2013-02-08 DIAGNOSIS — J449 Chronic obstructive pulmonary disease, unspecified: Secondary | ICD-10-CM | POA: Diagnosis not present

## 2013-02-08 DIAGNOSIS — E119 Type 2 diabetes mellitus without complications: Secondary | ICD-10-CM | POA: Diagnosis not present

## 2013-02-08 DIAGNOSIS — I428 Other cardiomyopathies: Secondary | ICD-10-CM | POA: Diagnosis not present

## 2013-02-08 DIAGNOSIS — J4489 Other specified chronic obstructive pulmonary disease: Secondary | ICD-10-CM | POA: Insufficient documentation

## 2013-02-08 DIAGNOSIS — M129 Arthropathy, unspecified: Secondary | ICD-10-CM | POA: Diagnosis not present

## 2013-02-08 DIAGNOSIS — I10 Essential (primary) hypertension: Secondary | ICD-10-CM | POA: Diagnosis not present

## 2013-02-08 DIAGNOSIS — I2510 Atherosclerotic heart disease of native coronary artery without angina pectoris: Secondary | ICD-10-CM | POA: Insufficient documentation

## 2013-02-08 DIAGNOSIS — K219 Gastro-esophageal reflux disease without esophagitis: Secondary | ICD-10-CM | POA: Diagnosis not present

## 2013-02-08 DIAGNOSIS — E669 Obesity, unspecified: Secondary | ICD-10-CM | POA: Insufficient documentation

## 2013-02-08 HISTORY — PX: BIV ICD GENERTAOR CHANGE OUT: SHX5745

## 2013-02-08 LAB — CBC
HCT: 36 % — ABNORMAL LOW (ref 39.0–52.0)
Hemoglobin: 12 g/dL — ABNORMAL LOW (ref 13.0–17.0)
Platelets: 193 10*3/uL (ref 150–400)
WBC: 8.8 10*3/uL (ref 4.0–10.5)

## 2013-02-08 LAB — BASIC METABOLIC PANEL
BUN: 17 mg/dL (ref 6–23)
CO2: 24 mEq/L (ref 19–32)
Calcium: 9 mg/dL (ref 8.4–10.5)
Chloride: 103 mEq/L (ref 96–112)
Glucose, Bld: 116 mg/dL — ABNORMAL HIGH (ref 70–99)
Potassium: 4.1 mEq/L (ref 3.5–5.1)

## 2013-02-08 LAB — GLUCOSE, CAPILLARY: Glucose-Capillary: 118 mg/dL — ABNORMAL HIGH (ref 70–99)

## 2013-02-08 SURGERY — BIV ICD GENERTAOR CHANGE OUT
Anesthesia: LOCAL

## 2013-02-08 MED ORDER — HEPARIN (PORCINE) IN NACL 2-0.9 UNIT/ML-% IJ SOLN
INTRAMUSCULAR | Status: AC
  Filled 2013-02-08: qty 500

## 2013-02-08 MED ORDER — MIDAZOLAM HCL 5 MG/5ML IJ SOLN
INTRAMUSCULAR | Status: AC
  Filled 2013-02-08: qty 5

## 2013-02-08 MED ORDER — MUPIROCIN 2 % EX OINT
TOPICAL_OINTMENT | CUTANEOUS | Status: AC
  Filled 2013-02-08: qty 22

## 2013-02-08 MED ORDER — SODIUM CHLORIDE 0.9 % IR SOLN
80.0000 mg | Status: DC
  Filled 2013-02-08: qty 2

## 2013-02-08 MED ORDER — ONDANSETRON HCL 4 MG/2ML IJ SOLN: 4.0000 mg | Freq: Four times a day (QID) | INTRAMUSCULAR | Status: DC | PRN

## 2013-02-08 MED ORDER — CHLORHEXIDINE GLUCONATE 4 % EX LIQD
60.0000 mL | Freq: Once | CUTANEOUS | Status: DC
  Filled 2013-02-08: qty 60

## 2013-02-08 MED ORDER — LIDOCAINE HCL (PF) 1 % IJ SOLN
INTRAMUSCULAR | Status: AC
  Filled 2013-02-08: qty 60

## 2013-02-08 MED ORDER — MUPIROCIN 2 % EX OINT
TOPICAL_OINTMENT | Freq: Two times a day (BID) | CUTANEOUS | Status: DC
  Administered 2013-02-08: 1 via NASAL
  Filled 2013-02-08: qty 22

## 2013-02-08 MED ORDER — SODIUM CHLORIDE 0.9 % IV SOLN: INTRAVENOUS | Status: AC

## 2013-02-08 MED ORDER — ACETAMINOPHEN 325 MG PO TABS
325.0000 mg | ORAL_TABLET | ORAL | Status: DC | PRN
  Filled 2013-02-08: qty 2

## 2013-02-08 MED ORDER — SODIUM CHLORIDE 0.9 % IV SOLN
INTRAVENOUS | Status: DC
  Administered 2013-02-08: 12:00:00 via INTRAVENOUS

## 2013-02-08 MED ORDER — CEFAZOLIN SODIUM-DEXTROSE 2-3 GM-% IV SOLR
2.0000 g | INTRAVENOUS | Status: DC
  Filled 2013-02-08 (×2): qty 50

## 2013-02-08 MED ORDER — FENTANYL CITRATE 0.05 MG/ML IJ SOLN
INTRAMUSCULAR | Status: AC
  Filled 2013-02-08: qty 2

## 2013-02-08 NOTE — Progress Notes (Signed)
NO SLING NEEDED PER BR[place]KE,PA WITH DR Graciela Husbands

## 2013-02-08 NOTE — H&P (Signed)
ELECTROPHYSIOLOGY ADMISSION HISTORY & PHYSICAL  Patient ID: [name] MRN: [id number], DOB/AGE: [birth year]   Date of Admission: 02/08/2013  Primary Physician: Kerby Nora, MD Primary EP: Berton Mount, MD Reason for Admission: BiV ICD generator change  History of Present Illness [name] is an 79 year old man with a NICM, prior VF arrest s/p BiV ICD implant, permanent AFib s/p AV node ablation (not on anticoagulation due to GI bleeding), HTN and COPD who was seen in follow-up in August 2014 at which time his BiV ICD battery was found to be at [facility]. He presents today for BiV ICD generator change.   Past Medical History Past Medical History  Diagnosis Date  . COPD (chronic obstructive pulmonary disease)     GOLD II; Spirometry 07/10/2008 >FEV1 1.46 56% predicted, ratio of 66%  . Anemia     iron deficiency anemia with previous severe GI bleed   . Hypertension   . Hyperlipidemia   . Left bundle branch block   . Prostatic hypertrophy 09-11-97    Benign  . Obesity   . CHF (congestive heart failure)     secondary to nonischemic cardiomyopathy; ECHO 10/07 EF 20-25%, mod to severe MR; ECHO 1/10 EF 55-60%, mild MR; ECHO 2/11 55-65%, grade 1 diast dysfxn, miod to mod LAE, mild TR;    S/P Medtronic BiV ICD with biventricular function now turned off due to diahragmatic simulation  . CAD (coronary artery disease)     Mild, nonobstructive (LHC 1/07: mLAD 20%, pCFX 20-30%, mRCA 30%, EF 25%)  . Atrial fibrillation     s/p AV node ablation; not on coumadin due to GIB  . Cardiac arrest - ventricular fibrillation 02/2008    Aborted, shocked by ICD  . Dyspnea     exertional  . Pneumonia     01/2011  . Diabetes mellitus     type 2 NIDDM x 5-6 yrs  . GERD (gastroesophageal reflux disease)   . Cancer     prostate  . Arthritis     back and knees  . 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 Inguinal hernia     left; asymptomatic    Past Surgical History Past Surgical  History  Procedure Laterality Date  . Lobectomy  01-21-98    lung  . Rotator cuff repair  1994 and 1995  . Knee surgery  1996    Left  . Cardiac catheterization  01/2006    Showed mild nonobstructive CAD  . Cardiac catheterization  11/05/2006    Right atrial pressure mean of 12, RV pressure 36/8, PA pressure 39/16 witha mean of 28, wedge pressure was 20. Fick cardiac output was 5 liters per minute, cardiac index was 2.4  . Esophagogastroduodenoscopy  09/1997    Prepylor ulcer, esoph ring, duod avm  . Colonoscopy w/ polypectomy  11/1997    Divertics, int hemms  . Mch gi bleed  02/07-02/12/2002  . Esophagogastroduodenoscopy  05/22/2002    Poss Barrett's   . Colonoscopy  06/26/2002    Multip (neg) divertics, int hemm  . Colonoscopy  07/10/2004    Poyps, divertics, int hemms  . Mch sob  10/11-10/18/2007    A fib, CHF  . Esophagogastroduodenoscopy  02/19/2006    HH No active bleeding  . Ct head limited w/o cm  10/03/2006    No acute abnmlty  . Coronary angioplasty      Min abstrut zd severe LB dystn EF 25%  .  Mch x  11/08-11/03/2006    Acute blood loss, anemia, sys HF, isch cardiomyopathy  . Hosp  8/14-8/23/2008    Acute on chronis CHF IIIB NOnisch Cardiomyop EF 20-25% Mod-Sev MR  . Colonoscopy  06/19/2008    2 polyps divertics int hemms (Dr Marina Goodell)  . Insert / replace / remove pacemaker  2007  . Eye surgery      cataract, bilateral  . Anterior cervical discectomy  02/2010  . Knee arthroscopy  1988  . Lumbar laminectomy/decompression microdiscectomy  04/29/2011    Procedure: LUMBAR LAMINECTOMY/DECOMPRESSION MICRODISCECTOMY;  Surgeon: Tia Alert, MD;  Location: [facility];  Service: Neurosurgery;  Laterality: N/A;  Lumbar Two, Three, Four, Five Decompressive Lumbar Laminectomies    Allergies/Intolerances No Known Allergies  Home Medications Medications Prior to Admission  Medication Sig Dispense Refill  . albuterol (PROAIR HFA) 108 (90 BASE) MCG/ACT inhaler INHALE 2  PUFFS INTO THE LUNGS EVERY 6 HOURS AS NEEDED FOR WHEEZING  3 Inhaler  1  . aspirin 81 MG tablet Take 81 mg by mouth 2 (two) times daily.      Marland Kitchen atorvastatin (LIPITOR) 20 MG tablet TAKE 1 TABLET DAILY AT     BEDTIME  90 tablet  3  . carvedilol (COREG) 3.125 MG tablet Take 1 tablet (3.125 mg total) by mouth 2 (two) times daily with a meal.  180 tablet  3  . Cholecalciferol (TH VITAMIN D3) 2000 UNITS CAPS Take by mouth daily.        . colchicine 0.6 MG tablet Take 1 tablet (0.6 mg total) by mouth 2 (two) times daily.  180 tablet  3  . ACCU-CHEK AVIVA PLUS test strip USE AND DISCARD 1 TEST     STRIP DAILY AND AS NEEDED  FOR DIABETES  100 strip  3  . acetaminophen (TYLENOL) 500 MG tablet Take 500 mg by mouth every 6 (six) hours as needed.        . Cyanocobalamin (VITAMIN B-12) 2500 MCG SUBL Place under the tongue daily.        Marland Kitchen dexlansoprazole (DEXILANT) 60 MG capsule Take 60 mg by mouth daily.      Marland Kitchen dutasteride (AVODART) 0.5 MG capsule Take 1 capsule (0.5 mg total) by mouth daily.  90 capsule  3  . eplerenone (INSPRA) 25 MG tablet TAKE 1 TABLET DAILY  90 tablet  3  . ferrous sulfate 325 (65 FE) MG tablet Take 1 tablet (325 mg total) by mouth daily with breakfast.  90 tablet  3  . furosemide (LASIX) 40 MG tablet TAKE 1 TABLET DAILY  90 tablet  1  . gabapentin (NEURONTIN) 300 MG capsule Take 1 capsule (300 mg total) by mouth 4 (four) times daily.  360 capsule  1  . guaiFENesin (MUCINEX) 600 MG 12 hr tablet Take 1,200 mg by mouth 2 (two) times daily as needed.       Marland Kitchen KLOR-CON M20 20 MEQ tablet TAKE 1 TABLET TWICE A DAY  180 tablet  3  . lisinopril (PRINIVIL,ZESTRIL) 2.5 MG tablet Take 1 tablet (2.5 mg total) by mouth daily.  90 tablet  3  . SPIRIVA HANDIHALER 18 MCG inhalation capsule INHALE THE CONTENTS OF ONE CAPSULE DAILY IN THE       MORNING VIA HANDIHALER     DEVICE  90 capsule  3  . tamsulosin (FLOMAX) 0.4 MG CAPS capsule TAKE 1 CAPSULE DAILY  90 capsule  1  . traMADol (ULTRAM) 50 MG tablet  Take 1 tablet (  50 mg total) by mouth every 8 (eight) hours as needed for pain.  30 tablet  0  . valACYclovir (VALTREX) 1000 MG tablet Take 1 tablet (1,000 mg total) by mouth 3 (three) times daily.  30 tablet  0    Family History Family History  Problem Relation Age of Onset  . Heart failure Mother     CHF  . Heart failure Father     CHF  . Hypertension Sister   . Obesity Sister   . Cancer Brother     lung cancer  . Lung cancer Brother   . Liver disease Brother     ETOH  . Alcohol abuse Brother   . Cancer Brother   . COPD Brother   . Cancer Brother   . Anxiety disorder Brother   . COPD Brother   . Hypertension Sister      Social History Social History  . Marital Status: Married   Social History Main Topics  . Smoking status: Current Every Day Smoker -- 1.00 packs/day for 60 years    Types: Cigarettes  . Smokeless tobacco: Never Used     Comment: Smoker since 56, quit for 2 years and started back in 03/2008  . Alcohol Use: No  . Drug Use: No   Review of Systems General: No chills, fever, night sweats or weight changes.  Cardiovascular: No chest pain, dyspnea on exertion, edema, orthopnea, palpitations, paroxysmal nocturnal dyspnea. Dermatological: No rash, lesions or masses. Respiratory: No cough, dyspnea. Urologic: No hematuria, dysuria. Abdominal: No nausea, vomiting, diarrhea, bright red blood per rectum, melena, or hematemesis. Neurologic: No visual changes, weakness, changes in mental status. All other systems reviewed and are otherwise negative except as noted above.  Physical Exam Vitals: Blood pressure 143/62, pulse 74, temperature 97.6 F (36.4 C), temperature source Oral, resp. rate 18, height 5\' 7"  (1.702 m), weight 189 lb (85.73 kg), SpO2 100.00%.  General: Well developed, well appearing 79 y.o. male in no acute distress. HEENT: Normocephalic, atraumatic. EOMs intact. Sclera nonicteric. Oropharynx clear.  Neck: Supple without bruits. No JVD. Lungs:  Respirations regular and unlabored, CTA bilaterally. No wheezes, rales or rhonchi. Heart: RRR. S1, S2 present. No murmurs, rub, S3 or S4. Abdomen: Soft, non-tender, non-distended. BS present x 4 quadrants. No hepatosplenomegaly.  Extremities: No clubbing, cyanosis or edema. DP/PT/Radials 2+ and equal bilaterally. Psych: Normal affect. Neuro: Alert and oriented X 3. Moves all extremities spontaneously. Musculoskeletal: No kyphosis. Skin: Intact. Warm and dry. No rashes or petechiae in exposed areas.   Labs Lab Results  Component Value Date   WBC 6.3 01/18/2013   HGB 12.6* 01/18/2013   HCT 36.9* 01/18/2013   MCV 91.2 01/18/2013   PLT 140.0* 01/18/2013      Component Value Date/Time   NA 141 01/18/2013 1438   K 3.8 01/18/2013 1438   CL 106 01/18/2013 1438   CO2 25 01/18/2013 1438   GLUCOSE 148* 01/18/2013 1438   BUN 19 01/18/2013 1438   CREATININE 1.0 01/18/2013 1438   CALCIUM 8.9 01/18/2013 1438   GFRNONAA 64* 05/02/2011 0950   GFRAA 74* 05/02/2011 0950    Radiology/Studies No results found.  Assessment and Plan 1. BiV ICD battery at [facility] 2. NICM with prior VF arrest s/p BiV ICD implant 3. Permanent AFib s/p AV node ablation (not anticoagulated due to GI bleeding) 4. HTN 5. COPD  [name] BiV ICD battery is at [facility]. As discussed during his visit with Dr. Graciela Husbands, he elects to proceed with BiV  ICD generator change. Risks, benefits and alternatives to BiV ICD generator change were discussed in detail with him. These risks include, but are not limited to, bleeding and infection. [name] expressed verbal understanding and wishes to proceed.   Signed, Rick Duff, PA-C 02/08/2013, 12:15 PM

## 2013-02-08 NOTE — CV Procedure (Signed)
Preoperative diagnosis  Nonischemic cardiomyopathy intercurrent recovery ; prev ICD-CRT now at [facility]  Permanent Afib Postoperative diagnosis same/   Procedure: Generator replacement     Following informed consent the patient was brought to the electrophysiology laboratory in place of the fluoroscopic table in the supine position after routine prep and drape lidocaine was infiltrated in the region of the previous incision and carried down to later the device pocket using sharp dissection and electrocautery. The pocket was opened the device was freed up and was explanted.  Interrogation of the previously implanted ICD ventricular lead Medtronic [phone]  demonstrated an R wave of N/A  millivolts.,      Interrogation of the previously implanted Left ventricular lead Medtronic [id number]  demonstrated an R wave of N/A  millivolts., and impedance of 483 ohms, and a pacing threshold of 2.25 volts at 0.5 msec.    The previously implanted atrial lead Medtronic 5076 demonstrated a P-wave amplitude of ATRIAL FIB milllivolts    The leads were then attached to a Medtronic  pulse generator, serial number [id number]  Through the device the P-wave amplitude  Was  0.8 milllivolts and impedance of  456 ohms, and a pacing threshold N/A; the ICD ventricular lead demonstrated an R wave of N/A  millivolts., and impedance of 494 ohms, and a pacing threshold of 0.75 volts at 0.4 msec and the  Left ventricular lead demonstrated an  impedance of 494 ohms, and a pacing threshold of 2.5 volts at 0.6 msec.    High voltage impedances were 51/42 ohms  The pocket was irrigated with antibiotic containing saline solution hemostasis was assured and the leads and the device were placed in the pocket. The wound was then closed in 2 layers in normal fashion. dERMAbond was applied  The patient tolerated the procedure without apparent complication.  DFT testing was not performed  Sherryl Manges   \

## 2013-02-08 NOTE — Interval H&P Note (Signed)
ICD Criteria  Current LVEF (within 6 months):50%  NYHA Functional Classification: Class II  Heart Failure History:  Yes, Duration of heart failure since onset is > 9 months  Non-Ischemic Dilated Cardiomyopathy History:  Yes, timeframe is > 9 months  Atrial Fibrillation/Atrial Flutter:  Yes, A-Fib/A-Flutter type: Permanent (>1 year).  Ventricular Tachycardia History:  No.  Cardiac Arrest History:  No  History of Syndromes with Risk of Sudden Death:  No.  Previous ICD:  Yes, ICD Type:  CRT-D, Reason for ICD:  Primary prevention.  25%  Electrophysiology Study: No.  Anticoagulation Therapy:  Patient is NOT on anticoagulation therapy.   Beta Blocker Therapy:  Yes.   Ace Inhibitor/ARB Therapy:  Yes.History and Physical Interval Note:  02/08/2013 2:35 PM  [name]  has presented today for surgery, with the diagnosis of End of life  The various methods of treatment have been discussed with the patient and family. After consideration of risks, benefits and other options for treatment, the patient has consented to  Procedure(s): BIV ICD GENERTAOR CHANGE OUT (N/A) as a surgical intervention .  The patient's history has been reviewed, patient examined, no change in status, stable for surgery.  I have reviewed the patient's chart and labs.  Questions were answered to the patient's satisfaction.     Sherryl Manges

## 2013-02-17 ENCOUNTER — Telehealth: Payer: Self-pay | Admitting: Internal Medicine

## 2013-02-17 NOTE — Telephone Encounter (Signed)
New problem   Pt stated he was returning a call from someone in Dr Odessa Fleming office. Please call pt. There was no message of who called pt.

## 2013-02-22 ENCOUNTER — Ambulatory Visit (INDEPENDENT_AMBULATORY_CARE_PROVIDER_SITE_OTHER): Payer: Medicare Other | Admitting: *Deleted

## 2013-02-22 ENCOUNTER — Encounter: Payer: Self-pay | Admitting: Internal Medicine

## 2013-02-22 DIAGNOSIS — Z95810 Presence of automatic (implantable) cardiac defibrillator: Secondary | ICD-10-CM

## 2013-02-22 DIAGNOSIS — I5032 Chronic diastolic (congestive) heart failure: Secondary | ICD-10-CM | POA: Diagnosis not present

## 2013-02-22 DIAGNOSIS — I4891 Unspecified atrial fibrillation: Secondary | ICD-10-CM

## 2013-02-22 DIAGNOSIS — I472 Ventricular tachycardia: Secondary | ICD-10-CM | POA: Diagnosis not present

## 2013-02-22 DIAGNOSIS — I4729 Other ventricular tachycardia: Secondary | ICD-10-CM

## 2013-02-22 NOTE — Progress Notes (Signed)
Pt seen in device clinic for follow up of recently replaced CRT-D.  Wound well healed.  No redness, swelling, or edema.  No steri-strips (surgical glue).   Device interrogated and found to be functioning normally.  Changed VF NID from 24/32 to 30/40 & VT NID from 28 to 32. See PaceArt for full details.  [facility] 05/29/13 & ROV w/ Dr. Graciela Husbands 01/2014.    [name] 02/22/2013 1:14 PM

## 2013-02-22 NOTE — Telephone Encounter (Signed)
Explained to patient that I don't see anything about calling him, I only see his appointment for today with device clinic. I told him that it was most likely to remind him of today's appointment. Patient agreeable.

## 2013-02-27 ENCOUNTER — Inpatient Hospital Stay (HOSPITAL_COMMUNITY)
Admission: EM | Admit: 2013-02-27 | Discharge: 2013-03-01 | DRG: 554 | Disposition: A | Discharge status: Home / Self Care | Payer: Medicare Other | Attending: Internal Medicine | Admitting: Internal Medicine

## 2013-02-27 ENCOUNTER — Emergency Department (HOSPITAL_COMMUNITY): Payer: Medicare Other

## 2013-02-27 ENCOUNTER — Encounter (HOSPITAL_COMMUNITY): Payer: Self-pay | Admitting: Emergency Medicine

## 2013-02-27 DIAGNOSIS — Z95810 Presence of automatic (implantable) cardiac defibrillator: Secondary | ICD-10-CM

## 2013-02-27 DIAGNOSIS — K219 Gastro-esophageal reflux disease without esophagitis: Secondary | ICD-10-CM | POA: Diagnosis present

## 2013-02-27 DIAGNOSIS — M7989 Other specified soft tissue disorders: Secondary | ICD-10-CM | POA: Diagnosis not present

## 2013-02-27 DIAGNOSIS — R609 Edema, unspecified: Secondary | ICD-10-CM | POA: Diagnosis not present

## 2013-02-27 DIAGNOSIS — M109 Gout, unspecified: Secondary | ICD-10-CM | POA: Diagnosis not present

## 2013-02-27 DIAGNOSIS — R0789 Other chest pain: Secondary | ICD-10-CM

## 2013-02-27 DIAGNOSIS — E559 Vitamin D deficiency, unspecified: Secondary | ICD-10-CM

## 2013-02-27 DIAGNOSIS — E785 Hyperlipidemia, unspecified: Secondary | ICD-10-CM

## 2013-02-27 DIAGNOSIS — L0291 Cutaneous abscess, unspecified: Secondary | ICD-10-CM

## 2013-02-27 DIAGNOSIS — I959 Hypotension, unspecified: Secondary | ICD-10-CM | POA: Diagnosis not present

## 2013-02-27 DIAGNOSIS — E669 Obesity, unspecified: Secondary | ICD-10-CM | POA: Diagnosis present

## 2013-02-27 DIAGNOSIS — I10 Essential (primary) hypertension: Secondary | ICD-10-CM | POA: Diagnosis not present

## 2013-02-27 DIAGNOSIS — D72829 Elevated white blood cell count, unspecified: Secondary | ICD-10-CM | POA: Diagnosis not present

## 2013-02-27 DIAGNOSIS — E119 Type 2 diabetes mellitus without complications: Secondary | ICD-10-CM | POA: Diagnosis not present

## 2013-02-27 DIAGNOSIS — N400 Enlarged prostate without lower urinary tract symptoms: Secondary | ICD-10-CM

## 2013-02-27 DIAGNOSIS — Z8709 Personal history of other diseases of the respiratory system: Secondary | ICD-10-CM

## 2013-02-27 DIAGNOSIS — J449 Chronic obstructive pulmonary disease, unspecified: Secondary | ICD-10-CM | POA: Diagnosis not present

## 2013-02-27 DIAGNOSIS — G609 Hereditary and idiopathic neuropathy, unspecified: Secondary | ICD-10-CM

## 2013-02-27 DIAGNOSIS — M129 Arthropathy, unspecified: Secondary | ICD-10-CM | POA: Diagnosis present

## 2013-02-27 DIAGNOSIS — J4489 Other specified chronic obstructive pulmonary disease: Secondary | ICD-10-CM

## 2013-02-27 DIAGNOSIS — L0390 Cellulitis, unspecified: Secondary | ICD-10-CM

## 2013-02-27 DIAGNOSIS — I4891 Unspecified atrial fibrillation: Secondary | ICD-10-CM | POA: Diagnosis present

## 2013-02-27 DIAGNOSIS — Z79899 Other long term (current) drug therapy: Secondary | ICD-10-CM

## 2013-02-27 DIAGNOSIS — M79604 Pain in right leg: Secondary | ICD-10-CM

## 2013-02-27 DIAGNOSIS — Z872 Personal history of diseases of the skin and subcutaneous tissue: Secondary | ICD-10-CM

## 2013-02-27 DIAGNOSIS — L02419 Cutaneous abscess of limb, unspecified: Secondary | ICD-10-CM | POA: Diagnosis not present

## 2013-02-27 DIAGNOSIS — I4901 Ventricular fibrillation: Secondary | ICD-10-CM

## 2013-02-27 DIAGNOSIS — I2510 Atherosclerotic heart disease of native coronary artery without angina pectoris: Secondary | ICD-10-CM | POA: Diagnosis present

## 2013-02-27 DIAGNOSIS — I5032 Chronic diastolic (congestive) heart failure: Secondary | ICD-10-CM | POA: Diagnosis not present

## 2013-02-27 DIAGNOSIS — C61 Malignant neoplasm of prostate: Secondary | ICD-10-CM

## 2013-02-27 DIAGNOSIS — M79609 Pain in unspecified limb: Secondary | ICD-10-CM | POA: Diagnosis not present

## 2013-02-27 DIAGNOSIS — G909 Disorder of the autonomic nervous system, unspecified: Secondary | ICD-10-CM

## 2013-02-27 DIAGNOSIS — F17200 Nicotine dependence, unspecified, uncomplicated: Secondary | ICD-10-CM

## 2013-02-27 DIAGNOSIS — I509 Heart failure, unspecified: Secondary | ICD-10-CM | POA: Diagnosis present

## 2013-02-27 DIAGNOSIS — D649 Anemia, unspecified: Secondary | ICD-10-CM | POA: Diagnosis present

## 2013-02-27 DIAGNOSIS — R52 Pain, unspecified: Secondary | ICD-10-CM | POA: Diagnosis not present

## 2013-02-27 DIAGNOSIS — J441 Chronic obstructive pulmonary disease with (acute) exacerbation: Secondary | ICD-10-CM

## 2013-02-27 DIAGNOSIS — I472 Ventricular tachycardia: Secondary | ICD-10-CM

## 2013-02-27 DIAGNOSIS — D509 Iron deficiency anemia, unspecified: Secondary | ICD-10-CM

## 2013-02-27 DIAGNOSIS — I4729 Other ventricular tachycardia: Secondary | ICD-10-CM

## 2013-02-27 DIAGNOSIS — B029 Zoster without complications: Secondary | ICD-10-CM

## 2013-02-27 DIAGNOSIS — E538 Deficiency of other specified B group vitamins: Secondary | ICD-10-CM

## 2013-02-27 DIAGNOSIS — R5381 Other malaise: Secondary | ICD-10-CM

## 2013-02-27 DIAGNOSIS — J984 Other disorders of lung: Secondary | ICD-10-CM

## 2013-02-27 DIAGNOSIS — R42 Dizziness and giddiness: Secondary | ICD-10-CM

## 2013-02-27 LAB — BASIC METABOLIC PANEL
BUN: 18 mg/dL (ref 6–23)
CO2: 28 mEq/L (ref 19–32)
Calcium: 10.1 mg/dL (ref 8.4–10.5)
Chloride: 98 mEq/L (ref 96–112)
Creatinine, Ser: 1.13 mg/dL (ref 0.50–1.35)
GFR calc Af Amer: 69 mL/min — ABNORMAL LOW (ref >90)
Glucose, Bld: 184 mg/dL — ABNORMAL HIGH (ref 70–99)

## 2013-02-27 LAB — CBC WITH DIFFERENTIAL/PLATELET
Basophils Absolute: 0 10*3/uL (ref 0.0–0.1)
Basophils Relative: 0 % (ref 0–1)
HCT: 35.8 % — ABNORMAL LOW (ref 39.0–52.0)
Lymphs Abs: 2.9 10*3/uL (ref 0.7–4.0)
MCH: 31.8 pg (ref 26.0–34.0)
MCV: 91.8 fL (ref 78.0–100.0)
Monocytes Absolute: 1.7 10*3/uL — ABNORMAL HIGH (ref 0.1–1.0)
Monocytes Relative: 12 % (ref 3–12)
Neutro Abs: 9.6 10*3/uL — ABNORMAL HIGH (ref 1.7–7.7)
Platelets: 215 10*3/uL (ref 150–400)
RDW: 14.7 % (ref 11.5–15.5)
WBC: 14.3 10*3/uL — ABNORMAL HIGH (ref 4.0–10.5)

## 2013-02-27 LAB — CG4 I-STAT (LACTIC ACID): Lactic Acid, Venous: 1.17 mmol/L (ref 0.5–2.2)

## 2013-02-27 MED ORDER — MORPHINE SULFATE 4 MG/ML IJ SOLN
4.0000 mg | Freq: Once | INTRAMUSCULAR | Status: AC
  Administered 2013-02-27: 4 mg via INTRAVENOUS
  Filled 2013-02-27: qty 1

## 2013-02-27 MED ORDER — SODIUM CHLORIDE 0.9 % IV BOLUS (SEPSIS)
500.0000 mL | Freq: Once | INTRAVENOUS | Status: AC
  Administered 2013-02-27: 500 mL via INTRAVENOUS

## 2013-02-27 MED ORDER — CLINDAMYCIN PHOSPHATE 600 MG/50ML IV SOLN
600.0000 mg | Freq: Once | INTRAVENOUS | Status: AC
  Administered 2013-02-27: 600 mg via INTRAVENOUS
  Filled 2013-02-27: qty 50

## 2013-02-27 MED ORDER — ONDANSETRON HCL 4 MG/2ML IJ SOLN
4.0000 mg | Freq: Once | INTRAMUSCULAR | Status: AC
  Administered 2013-02-27: 4 mg via INTRAVENOUS
  Filled 2013-02-27: qty 2

## 2013-02-27 NOTE — ED Notes (Signed)
Per EMS: pt c/o pain swelling of left foot; redness noted with pitting edema x 3 days; pt unable to put weight due to pain

## 2013-02-27 NOTE — ED Provider Notes (Signed)
CSN: [id number]     Arrival date & time 02/27/13  1224 History   First MD Initiated Contact with Patient 02/27/13 1723     Chief Complaint  Patient presents with  . Foot Pain   (Consider location/radiation/quality/duration/timing/severity/associated sxs/prior Treatment) HPI Pt presenting with c/o pain and redness and swelling of left foot- symptoms started approx 3 days ago.  Pt felt he had subjective fever this morning- took tylenol.  No known injury.  Swelling of top of left foot and around ankle.  He states he is unable to bear weight due to pain in foot.  He uses a motorized wheelchair at home and has been using this more frequently due to pain.  Pain worse with movement and palpation.  No chest pain or shortness of breath.  Of note, he has been told that he does not have diabetes and has been off medication for the past 6 months with no elevated blood glucoses.    Past Medical History  Diagnosis Date  . COPD (chronic obstructive pulmonary disease)     GOLD II; Spirometry 07/10/2008 >FEV1 1.46 56% predicted, ratio of 66%  . Anemia     iron deficiency anemia with previous severe GI bleed   . Hypertension   . Hyperlipidemia   . Left bundle branch block   . Prostatic hypertrophy 09-11-97    Benign  . Obesity   . CHF (congestive heart failure)     secondary to nonischemic cardiomyopathy; ECHO 10/07 EF 20-25%, mod to severe MR; ECHO 1/10 EF 55-60%, mild MR; ECHO 2/11 55-65%, grade 1 diast dysfxn, miod to mod LAE, mild TR;    S/P Medtronic BiV ICD with biventricular function now turned off due to diahragmatic simulation  . CAD (coronary artery disease)     Mild, nonobstructive (LHC 1/07: mLAD 20%, pCFX 20-30%, mRCA 30%, EF 25%)  . Atrial fibrillation     s/p AV node ablation; not on coumadin due to GIB  . Cardiac arrest - ventricular fibrillation 02/2008    Aborted, shocked by ICD  . Dyspnea     exertional  . Pneumonia     01/2011  . Diabetes mellitus     type 2 NIDDM x 5-6 yrs  .  GERD (gastroesophageal reflux disease)   . Cancer     prostate  . Arthritis     back and knees  . 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 Inguinal hernia     left; asymptomatic   Past Surgical History  Procedure Laterality Date  . Lobectomy  01-21-98    lung  . Rotator cuff repair  1994 and 1995  . Knee surgery  1996    Left  . Cardiac catheterization  01/2006    Showed mild nonobstructive CAD  . Cardiac catheterization  11/05/2006    Right atrial pressure mean of 12, RV pressure 36/8, PA pressure 39/16 witha mean of 28, wedge pressure was 20. Fick cardiac output was 5 liters per minute, cardiac index was 2.4  . Esophagogastroduodenoscopy  09/1997    Prepylor ulcer, esoph ring, duod avm  . Colonoscopy w/ polypectomy  11/1997    Divertics, int hemms  . Mch gi bleed  02/07-02/12/2002  . Esophagogastroduodenoscopy  05/22/2002    Poss Barrett's   . Colonoscopy  06/26/2002    Multip (neg) divertics, int hemm  . Colonoscopy  07/10/2004    Poyps, divertics, int hemms  . Mch sob  10/11-10/18/2007  A fib, CHF  . Esophagogastroduodenoscopy  02/19/2006    HH No active bleeding  . Ct head limited w/o cm  10/03/2006    No acute abnmlty  . Coronary angioplasty      Min abstrut zd severe LB dystn EF 25%  . Mch x  11/08-11/03/2006    Acute blood loss, anemia, sys HF, isch cardiomyopathy  . Hosp  8/14-8/23/2008    Acute on chronis CHF IIIB NOnisch Cardiomyop EF 20-25% Mod-Sev MR  . Colonoscopy  06/19/2008    2 polyps divertics int hemms (Dr Marina Goodell)  . Insert / replace / remove pacemaker  2007  . Eye surgery      cataract, bilateral  . Anterior cervical discectomy  02/2010  . Knee arthroscopy  1988  . Lumbar laminectomy/decompression microdiscectomy  04/29/2011    Procedure: LUMBAR LAMINECTOMY/DECOMPRESSION MICRODISCECTOMY;  Surgeon: Tia Alert, MD;  Location: [facility];  Service: Neurosurgery;  Laterality: N/A;  Lumbar Two, Three, Four, Five Decompressive Lumbar  Laminectomies   Family History  Problem Relation Age of Onset  . Heart failure Mother     CHF  . Heart failure Father     CHF  . Hypertension Sister   . Obesity Sister   . Cancer Brother     lung cancer  . Lung cancer Brother   . Liver disease Brother     ETOH  . Alcohol abuse Brother   . Cancer Brother   . COPD Brother   . Cancer Brother   . Anxiety disorder Brother   . COPD Brother   . Hypertension Sister    History  Substance Use Topics  . Smoking status: Current Every Day Smoker -- 1.00 packs/day for 60 years    Types: Cigarettes  . Smokeless tobacco: Never Used     Comment: Smoker since 80, quit for 2 years and started back in 03/2008  . Alcohol Use: No    Review of Systems ROS reviewed and all otherwise negative except for mentioned in HPI  Allergies  Review of patient's allergies indicates no known allergies.  Home Medications   No current outpatient prescriptions on file. BP 124/74  Pulse 71  Temp(Src) 97.8 F (36.6 C) (Oral)  Resp 18  Ht 5\' 7"  (1.702 m)  Wt 190 lb 7.6 oz (86.4 kg)  BMI 29.83 kg/m2  SpO2 95% Vitals reviewed Physical Exam Physical Examination: General appearance - alert, well appearing, and in no distress Mental status - alert, oriented to person, place, and time Eyes - no scleral icterus, no conjunctival injection Mouth - mucous membranes moist, pharynx normal without lesions Chest - clear to auscultation, no wheezes, rales or rhonchi, symmetric air entry Heart - normal rate, regular rhythm, normal S1, S2, no murmurs, rubs, clicks or gallops Abdomen - soft, nontender, nondistended, no masses or organomegaly Extremities - peripheral pulses normal, no pedal edema, no clubbing or cyanosis Skin - left foot and ankle with erythema, warmth to touch, 1-2+ pitting edema,  Left lower extremity distally NVI  ED Course  Procedures (including critical care time)  6:44 PM vascular tech has completed DVT study and states he is negative for  DVT  7:49 PM clindamycin going in, pt with decreased BP.  PT given IV fluid bolus, will obtain lactate and blood cultures.  Will plan for admission.  Pt was requesting to go home prior to BP drop but now will need to stay inpatient.    CRITICAL CARE Performed by: Ethelda Chick Total critical care  time: 35 Critical care time was exclusive of separately billable procedures and treating other patients. Critical care was necessary to treat or prevent imminent or life-threatening deterioration. Critical care was time spent personally by me on the following activities: development of treatment plan with patient and/or surrogate as well as nursing, discussions with consultants, evaluation of patient's response to treatment, examination of patient, obtaining history from patient or surrogate, ordering and performing treatments and interventions, ordering and review of laboratory studies, ordering and review of radiographic studies, pulse oximetry and re-evaluation of patient's condition. Labs Review Labs Reviewed  CBC WITH DIFFERENTIAL - Abnormal; Notable for the following:    WBC 14.3 (*)    RBC 3.90 (*)    Hemoglobin 12.4 (*)    HCT 35.8 (*)    Neutro Abs 9.6 (*)    Monocytes Absolute 1.7 (*)    All other components within normal limits  BASIC METABOLIC PANEL - Abnormal; Notable for the following:    Glucose, Bld 184 (*)    GFR calc non Af Amer 59 (*)    GFR calc Af Amer 69 (*)    All other components within normal limits  COMPREHENSIVE METABOLIC PANEL - Abnormal; Notable for the following:    Glucose, Bld 103 (*)    Albumin 3.2 (*)    GFR calc non Af Amer 76 (*)    GFR calc Af Amer 88 (*)    All other components within normal limits  CBC - Abnormal; Notable for the following:    WBC 12.0 (*)    RBC 3.54 (*)    Hemoglobin 10.9 (*)    HCT 33.1 (*)    All other components within normal limits  GLUCOSE, CAPILLARY - Abnormal; Notable for the following:    Glucose-Capillary 110 (*)     All other components within normal limits  CULTURE, BLOOD (ROUTINE X 2)  CULTURE, BLOOD (ROUTINE X 2)  PROTIME-INR  URIC ACID  CBC  CG4 I-STAT (LACTIC ACID)   Imaging Review Dg Foot Complete Left  02/27/2013   CLINICAL DATA:  Left foot pain and swelling  EXAM: LEFT FOOT - COMPLETE 3+ VIEW  COMPARISON:  None.  FINDINGS: No fracture dislocation.  There is a spur projecting from the plantar lateral margin of the base of the proximal phalanx of the great toe. Remaining joints are unremarkable appear The bones are demineralized.  Soft tissues show mild diffuse dorsal soft tissue swelling mostly of the forefoot.  IMPRESSION: No fracture or acute bony abnormality. Nonspecific soft tissue swelling.   Electronically Signed   By: Amie Portland M.D.   On: 02/27/2013 14:07    EKG Interpretation    Date/Time:    Ventricular Rate:    PR Interval:    QRS Duration:   QT Interval:    QTC Calculation:   R Axis:     Text Interpretation:              MDM  1. Cellulitis of left lower extremity 2. Leukocytosis 3. Hypotension responsive to IV fluid bolus   pt presenting with c/o redness and pain with swelling of left lower extremity.  Appears most consistent with cellulitis.  Elevated WBC as well.  DVT ruled out with venous duplex US.  Pt started on clindamycin.  Then had decrease in BP which was responsive to IV fluid bolus.  Blood cultures and lactic acid ordered.  Pt to be admitted to stepdown by [facility] service.     Ethelda Chick,  MD 02/28/13 1733

## 2013-02-27 NOTE — Progress Notes (Signed)
*  PRELIMINARY RESULTS* Vascular Ultrasound Left lower extremity venous duplex has been completed.  Preliminary findings: no evidence of DVT or baker's cyst   Farrel Demark, RDMS, RVT  02/27/2013, 6:40 PM

## 2013-02-27 NOTE — H&P (Signed)
[facility] History and Physical  Patient: [name]  ZOX:[id number]  DOB: March 29, 1931  DOS: 02/27/2013 PCP: Kerby Nora, MD  Chief Complaint: Left leg pain  HPI: [name] is a 79 y.o. male with Past medical history of COPD, hypertension, CHF, ICD, coronary artery disease, A. fib not on Coumadin due to history of GI bleed. The patient is coming from home. The patient presented with the complaint of pain and swelling of the left foot that has been ongoing since last 3 days. He also felt feverish this morning and therefore he came to the hospital. The pain was initially located only on the foot aVF but today it has also progressed to ankle and has affected his knees as well. He uses a motorized wheelchair at home for ambulation. He has history of chronic arthritis. The pain gets worse with movement and he has not taken any medication to help the 80s pain. He has history of gout and has been on colchicine in the past for the same. He feels as if it is a similar pain. He denies any other complaint of fever, chills, chest pain, shortness of breath, palpitation, nausea, vomiting, abdominal pain, diarrhea, burning urination.  Review of Systems: as mentioned in the history of present illness.  A Comprehensive review of the other systems is negative.  Past Medical History  Diagnosis Date  . COPD (chronic obstructive pulmonary disease)     GOLD II; Spirometry 07/10/2008 >FEV1 1.46 56% predicted, ratio of 66%  . Anemia     iron deficiency anemia with previous severe GI bleed   . Hypertension   . Hyperlipidemia   . Left bundle branch block   . Prostatic hypertrophy 09-11-97    Benign  . Obesity   . CHF (congestive heart failure)     secondary to nonischemic cardiomyopathy; ECHO 10/07 EF 20-25%, mod to severe MR; ECHO 1/10 EF 55-60%, mild MR; ECHO 2/11 55-65%, grade 1 diast dysfxn, miod to mod LAE, mild TR;    S/P Medtronic BiV ICD with biventricular function now turned off due  to diahragmatic simulation  . CAD (coronary artery disease)     Mild, nonobstructive (LHC 1/07: mLAD 20%, pCFX 20-30%, mRCA 30%, EF 25%)  . Atrial fibrillation     s/p AV node ablation; not on coumadin due to GIB  . Cardiac arrest - ventricular fibrillation 02/2008    Aborted, shocked by ICD  . Dyspnea     exertional  . Pneumonia     01/2011  . Diabetes mellitus     type 2 NIDDM x 5-6 yrs  . GERD (gastroesophageal reflux disease)   . Cancer     prostate  . Arthritis     back and knees  . 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 Inguinal hernia     left; asymptomatic   Past Surgical History  Procedure Laterality Date  . Lobectomy  01-21-98    lung  . Rotator cuff repair  1994 and 1995  . Knee surgery  1996    Left  . Cardiac catheterization  01/2006    Showed mild nonobstructive CAD  . Cardiac catheterization  11/05/2006    Right atrial pressure mean of 12, RV pressure 36/8, PA pressure 39/16 witha mean of 28, wedge pressure was 20. Fick cardiac output was 5 liters per minute, cardiac index was 2.4  . Esophagogastroduodenoscopy  09/1997    Prepylor ulcer, esoph ring, duod avm  .  Colonoscopy w/ polypectomy  11/1997    Divertics, int hemms  . Mch gi bleed  02/07-02/12/2002  . Esophagogastroduodenoscopy  05/22/2002    Poss Barrett's   . Colonoscopy  06/26/2002    Multip (neg) divertics, int hemm  . Colonoscopy  07/10/2004    Poyps, divertics, int hemms  . Mch sob  10/11-10/18/2007    A fib, CHF  . Esophagogastroduodenoscopy  02/19/2006    HH No active bleeding  . Ct head limited w/o cm  10/03/2006    No acute abnmlty  . Coronary angioplasty      Min abstrut zd severe LB dystn EF 25%  . Mch x  11/08-11/03/2006    Acute blood loss, anemia, sys HF, isch cardiomyopathy  . Hosp  8/14-8/23/2008    Acute on chronis CHF IIIB NOnisch Cardiomyop EF 20-25% Mod-Sev MR  . Colonoscopy  06/19/2008    2 polyps divertics int hemms (Dr Marina Goodell)  . Insert / replace /  remove pacemaker  2007  . Eye surgery      cataract, bilateral  . Anterior cervical discectomy  02/2010  . Knee arthroscopy  1988  . Lumbar laminectomy/decompression microdiscectomy  04/29/2011    Procedure: LUMBAR LAMINECTOMY/DECOMPRESSION MICRODISCECTOMY;  Surgeon: Tia Alert, MD;  Location: [facility];  Service: Neurosurgery;  Laterality: N/A;  Lumbar Two, Three, Four, Five Decompressive Lumbar Laminectomies   Social History:  reports that he has been smoking Cigarettes.  He has a 60 pack-year smoking history. He has never used smokeless tobacco. He reports that he does not drink alcohol or use illicit drugs. Partially Independent for most of his  ADL.  No Known Allergies  Family History  Problem Relation Age of Onset  . Heart failure Mother     CHF  . Heart failure Father     CHF  . Hypertension Sister   . Obesity Sister   . Cancer Brother     lung cancer  . Lung cancer Brother   . Liver disease Brother     ETOH  . Alcohol abuse Brother   . Cancer Brother   . COPD Brother   . Cancer Brother   . Anxiety disorder Brother   . COPD Brother   . Hypertension Sister     Prior to Admission medications   Medication Sig Start Date End Date Taking? Authorizing Provider  ACCU-CHEK AVIVA PLUS test strip USE AND DISCARD 1 TEST     STRIP DAILY AND AS NEEDED  FOR DIABETES 02/19/12  Yes Amy Michelle Nasuti, MD  acetaminophen (TYLENOL) 500 MG tablet Take 500 mg by mouth every 6 (six) hours as needed for mild pain.    Yes Historical Provider, MD  albuterol (PROVENTIL HFA;VENTOLIN HFA) 108 (90 BASE) MCG/ACT inhaler Inhale 2 puffs into the lungs every 6 (six) hours as needed for wheezing.   Yes Historical Provider, MD  aspirin 81 MG tablet Take 81 mg by mouth 2 (two) times daily.   Yes Historical Provider, MD  atorvastatin (LIPITOR) 20 MG tablet Take 20 mg by mouth daily.   Yes Historical Provider, MD  carvedilol (COREG) 3.125 MG tablet Take 1 tablet (3.125 mg total) by mouth 2 (two) times  daily with a meal. 08/10/12  Yes Duke Salvia, MD  Cholecalciferol [facility] VITAMIN D3) 2000 UNITS CAPS Take 2,000 Units by mouth daily.    Yes Historical Provider, MD  colchicine 0.6 MG tablet Take 1 tablet (0.6 mg total) by mouth 2 (two) times daily. 01/30/13  Yes Amy Michelle Nasuti, MD  Cyanocobalamin (VITAMIN B-12) 2500 MCG SUBL Place 2,500 mcg under the tongue daily.    Yes Historical Provider, MD  dexlansoprazole (DEXILANT) 60 MG capsule Take 60 mg by mouth daily. 12/03/11  Yes Spencer Copland, MD  dutasteride (AVODART) 0.5 MG capsule Take 1 capsule (0.5 mg total) by mouth daily. 11/12/11  Yes Amy Michelle Nasuti, MD  eplerenone (INSPRA) 25 MG tablet Take 25 mg by mouth daily.   Yes Historical Provider, MD  ferrous sulfate 325 (65 FE) MG tablet Take 325 mg by mouth 2 (two) times daily with a meal.    Yes Historical Provider, MD  furosemide (LASIX) 40 MG tablet Take 40 mg by mouth daily.   Yes Historical Provider, MD  gabapentin (NEURONTIN) 300 MG capsule Take 1 capsule (300 mg total) by mouth 4 (four) times daily. 01/13/13  Yes Amy Michelle Nasuti, MD  guaiFENesin (MUCINEX) 600 MG 12 hr tablet Take 1,200 mg by mouth 2 (two) times daily as needed for cough.    Yes Historical Provider, MD  lisinopril (PRINIVIL,ZESTRIL) 2.5 MG tablet Take 1 tablet (2.5 mg total) by mouth daily. 08/10/12  Yes Duke Salvia, MD  potassium chloride SA (K-DUR,KLOR-CON) 20 MEQ tablet Take 20 mEq by mouth 2 (two) times daily.   Yes Historical Provider, MD  tamsulosin (FLOMAX) 0.4 MG CAPS capsule Take 0.4 mg by mouth daily after supper.   Yes Historical Provider, MD  tiotropium (SPIRIVA) 18 MCG inhalation capsule Place 18 mcg into inhaler and inhale daily.   Yes Historical Provider, MD  traMADol (ULTRAM) 50 MG tablet Take 50 mg by mouth every 6 (six) hours as needed. For pain 01/21/13  Yes Historical Provider, MD    Physical Exam: Filed Vitals:   02/27/13 2200 02/27/13 2215 02/27/13 2230 02/27/13 2245  BP: 107/61 114/64 109/62 110/68   Pulse: 70 69 67 70  Temp:      TempSrc:      Resp:      SpO2: 90% 92% 93% 94%    General: Alert, Awake and Oriented to Time, Place and Person. Appear in mild distress Eyes: PERRL ENT: Oral Mucosa clear dry. Neck: No JVD Cardiovascular: S1 and S2 Present, aortic systolic Murmur, Peripheral Pulses Present Respiratory: Bilateral Air entry equal and Decreased, Clear to Auscultation,  no Crackles,no wheezes Abdomen: Bowel Sound Present, Soft and Non tender Skin: no Rash Extremities: Left leg Pedal edema, no calf tenderness Neurologic: Grossly Unremarkable.  Labs on Admission:  CBC:  Recent Labs Lab 02/27/13 1235  WBC 14.3*  NEUTROABS 9.6*  HGB 12.4*  HCT 35.8*  MCV 91.8  PLT 215    CMP     Component Value Date/Time   NA 136 02/27/2013 1235   K 3.6 02/27/2013 1235   CL 98 02/27/2013 1235   CO2 28 02/27/2013 1235   GLUCOSE 184* 02/27/2013 1235   BUN 18 02/27/2013 1235   CREATININE 1.13 02/27/2013 1235   CALCIUM 10.1 02/27/2013 1235   PROT 6.7 08/05/2011 1123   ALBUMIN 4.1 08/05/2011 1123   AST 38* 08/05/2011 1123   ALT 44 08/05/2011 1123   ALKPHOS 116 08/05/2011 1123   BILITOT 0.3 08/05/2011 1123   GFRNONAA 59* 02/27/2013 1235   GFRAA 69* 02/27/2013 1235    No results found for this basename: LIPASE, AMYLASE,  in the last 168 hours No results found for this basename: AMMONIA,  in the last 168 hours  No results found for this basename: CKTOTAL, CKMB, CKMBINDEX, TROPONINI,  in the last 168 hours BNP (last 3 results) No results found for this basename: PROBNP,  in the last 8760 hours  Radiological Exams on Admission: Dg Foot Complete Left  02/27/2013   CLINICAL DATA:  Left foot pain and swelling  EXAM: LEFT FOOT - COMPLETE 3+ VIEW  COMPARISON:  None.  FINDINGS: No fracture dislocation.  There is a spur projecting from the plantar lateral margin of the base of the proximal phalanx of the great toe. Remaining joints are unremarkable appear The bones are demineralized.   Soft tissues show mild diffuse dorsal soft tissue swelling mostly of the forefoot.  IMPRESSION: No fracture or acute bony abnormality. Nonspecific soft tissue swelling.   Electronically Signed   By: Amie Portland M.D.   On: 02/27/2013 14:07    EKG: Independently reviewed. nonspecific ST and T waves changes.  Assessment/Plan Principal Problem:   Cellulitis Active Problems:   DIABETES MELLITUS, TYPE II   Atrial fibrillation   Essential hypertension, benign   1. Cellulitis The patient is presenting with cellulitis of the left leg. The x-ray does not show any evidence of joint involvement or bony destruction. He also has history of gout which could be another possibility of his presentation. Currently he may be treated with IV clindamycin. Gentle IV fluids will be given due to his tachycardia and hypotension. If the symptom does not improve then he may require arthrocentesis.  2. Atrial fibrillation The patient is not on anticoagulation due to his recurrent fall Continue to monitor on telemetry  3. Hypertension At present holding the dose is due to his hypotensive episode  4. Gout history of Will place the patient on colchicine for possible gout as well   DVT Prophylaxis: subcutaneous Heparin Nutrition: Cardiac  Code Status: Full  Disposition: Admitted to inpatient in telemetry unit.  Author: Lynden Oxford, MD [facility] Pager: [phone] 02/27/2013, 11:42 PM    If 7PM-7AM, please contact night-coverage [email or web address] Password [email or web address]

## 2013-02-27 NOTE — ED Notes (Signed)
Pt blood pressure 85/47, MD aware, Ordered Bolus 500MG , Running infusion now

## 2013-02-28 ENCOUNTER — Encounter (HOSPITAL_COMMUNITY): Payer: Self-pay | Admitting: *Deleted

## 2013-02-28 DIAGNOSIS — I10 Essential (primary) hypertension: Secondary | ICD-10-CM

## 2013-02-28 DIAGNOSIS — M109 Gout, unspecified: Secondary | ICD-10-CM | POA: Diagnosis present

## 2013-02-28 DIAGNOSIS — J449 Chronic obstructive pulmonary disease, unspecified: Secondary | ICD-10-CM

## 2013-02-28 DIAGNOSIS — L0390 Cellulitis, unspecified: Secondary | ICD-10-CM | POA: Insufficient documentation

## 2013-02-28 LAB — COMPREHENSIVE METABOLIC PANEL
AST: 25 U/L (ref 0–37)
Albumin: 3.2 g/dL — ABNORMAL LOW (ref 3.5–5.2)
BUN: 21 mg/dL (ref 6–23)
Calcium: 9.3 mg/dL (ref 8.4–10.5)
Chloride: 101 mEq/L (ref 96–112)
Creatinine, Ser: 0.97 mg/dL (ref 0.50–1.35)
GFR calc Af Amer: 88 mL/min — ABNORMAL LOW (ref >90)
Glucose, Bld: 103 mg/dL — ABNORMAL HIGH (ref 70–99)
Total Protein: 6.6 g/dL (ref 6.0–8.3)

## 2013-02-28 LAB — GLUCOSE, CAPILLARY: Glucose-Capillary: 206 mg/dL — ABNORMAL HIGH (ref 70–99)

## 2013-02-28 LAB — CBC
HCT: 33.1 % — ABNORMAL LOW (ref 39.0–52.0)
Hemoglobin: 10.9 g/dL — ABNORMAL LOW (ref 13.0–17.0)
MCH: 30.8 pg (ref 26.0–34.0)
MCV: 93.5 fL (ref 78.0–100.0)
RBC: 3.54 MIL/uL — ABNORMAL LOW (ref 4.22–5.81)

## 2013-02-28 MED ORDER — SODIUM CHLORIDE 0.9 % IJ SOLN
3.0000 mL | Freq: Two times a day (BID) | INTRAMUSCULAR | Status: DC
  Administered 2013-02-28 – 2013-03-01 (×3): 3 mL via INTRAVENOUS

## 2013-02-28 MED ORDER — PANTOPRAZOLE SODIUM 40 MG PO TBEC
40.0000 mg | DELAYED_RELEASE_TABLET | Freq: Every day | ORAL | Status: DC
  Administered 2013-02-28 – 2013-03-01 (×2): 40 mg via ORAL
  Filled 2013-02-28 (×2): qty 1

## 2013-02-28 MED ORDER — COLCHICINE 0.6 MG PO TABS
0.6000 mg | ORAL_TABLET | Freq: Two times a day (BID) | ORAL | Status: DC
Start: 2013-02-28 — End: 2013-03-01
  Administered 2013-02-28 – 2013-03-01 (×3): 0.6 mg via ORAL
  Filled 2013-02-28 (×4): qty 1

## 2013-02-28 MED ORDER — ACETAMINOPHEN 650 MG RE SUPP: 650.0000 mg | Freq: Four times a day (QID) | RECTAL | Status: DC | PRN

## 2013-02-28 MED ORDER — TAMSULOSIN HCL 0.4 MG PO CAPS
0.4000 mg | ORAL_CAPSULE | Freq: Every day | ORAL | Status: DC
Start: 2013-02-28 — End: 2013-03-01
  Administered 2013-02-28: 0.4 mg via ORAL
  Filled 2013-02-28 (×2): qty 1

## 2013-02-28 MED ORDER — DUTASTERIDE 0.5 MG PO CAPS
0.5000 mg | ORAL_CAPSULE | Freq: Every day | ORAL | Status: DC
  Administered 2013-02-28 – 2013-03-01 (×2): 0.5 mg via ORAL
  Filled 2013-02-28 (×2): qty 1

## 2013-02-28 MED ORDER — PREDNISONE 20 MG PO TABS
30.0000 mg | ORAL_TABLET | Freq: Every day | ORAL | Status: DC
  Administered 2013-02-28 – 2013-03-01 (×2): 30 mg via ORAL
  Filled 2013-02-28 (×3): qty 1

## 2013-02-28 MED ORDER — CLINDAMYCIN PHOSPHATE 600 MG/50ML IV SOLN
600.0000 mg | Freq: Three times a day (TID) | INTRAVENOUS | Status: DC
  Administered 2013-02-28: 600 mg via INTRAVENOUS
  Filled 2013-02-28 (×3): qty 50

## 2013-02-28 MED ORDER — ASPIRIN 81 MG PO CHEW
81.0000 mg | CHEWABLE_TABLET | Freq: Two times a day (BID) | ORAL | Status: DC
  Administered 2013-02-28 – 2013-03-01 (×3): 81 mg via ORAL
  Filled 2013-02-28 (×3): qty 1

## 2013-02-28 MED ORDER — ASPIRIN 81 MG PO TABS: 81.0000 mg | ORAL_TABLET | Freq: Two times a day (BID) | ORAL | Status: DC

## 2013-02-28 MED ORDER — GABAPENTIN 300 MG PO CAPS
300.0000 mg | ORAL_CAPSULE | Freq: Four times a day (QID) | ORAL | Status: DC
  Administered 2013-02-28 – 2013-03-01 (×6): 300 mg via ORAL
  Filled 2013-02-28 (×8): qty 1

## 2013-02-28 MED ORDER — SODIUM CHLORIDE 0.9 % IV SOLN
INTRAVENOUS | Status: AC
  Administered 2013-02-28: 01:00:00 via INTRAVENOUS

## 2013-02-28 MED ORDER — TIOTROPIUM BROMIDE MONOHYDRATE 18 MCG IN CAPS
18.0000 ug | ORAL_CAPSULE | Freq: Every day | RESPIRATORY_TRACT | Status: DC
  Administered 2013-02-28 – 2013-03-01 (×2): 18 ug via RESPIRATORY_TRACT
  Filled 2013-02-28: qty 5

## 2013-02-28 MED ORDER — ENOXAPARIN SODIUM 40 MG/0.4ML ~~LOC~~ SOLN
40.0000 mg | SUBCUTANEOUS | Status: DC
  Administered 2013-02-28 – 2013-03-01 (×2): 40 mg via SUBCUTANEOUS
  Filled 2013-02-28 (×2): qty 0.4

## 2013-02-28 MED ORDER — ACETAMINOPHEN 325 MG PO TABS: 650.0000 mg | ORAL_TABLET | Freq: Four times a day (QID) | ORAL | Status: DC | PRN

## 2013-02-28 MED ORDER — FERROUS SULFATE 325 (65 FE) MG PO TABS
325.0000 mg | ORAL_TABLET | Freq: Two times a day (BID) | ORAL | Status: DC
  Administered 2013-02-28 – 2013-03-01 (×3): 325 mg via ORAL
  Filled 2013-02-28 (×5): qty 1

## 2013-02-28 MED ORDER — ACETAMINOPHEN 500 MG PO TABS: 500.0000 mg | ORAL_TABLET | Freq: Four times a day (QID) | ORAL | Status: DC | PRN

## 2013-02-28 MED ORDER — TRAMADOL HCL 50 MG PO TABS
50.0000 mg | ORAL_TABLET | Freq: Four times a day (QID) | ORAL | Status: DC | PRN
  Administered 2013-02-28 (×2): 50 mg via ORAL
  Filled 2013-02-28 (×2): qty 1

## 2013-02-28 MED ORDER — ATORVASTATIN CALCIUM 20 MG PO TABS
20.0000 mg | ORAL_TABLET | Freq: Every day | ORAL | Status: DC
Start: 2013-02-28 — End: 2013-03-01
  Administered 2013-02-28 – 2013-03-01 (×2): 20 mg via ORAL
  Filled 2013-02-28 (×2): qty 1

## 2013-02-28 MED ORDER — CARVEDILOL 3.125 MG PO TABS
3.1250 mg | ORAL_TABLET | Freq: Two times a day (BID) | ORAL | Status: DC
Start: 2013-03-01 — End: 2013-03-01
  Administered 2013-03-01: 3.125 mg via ORAL
  Filled 2013-02-28 (×3): qty 1

## 2013-02-28 MED ORDER — ALBUTEROL SULFATE HFA 108 (90 BASE) MCG/ACT IN AERS
2.0000 | INHALATION_SPRAY | Freq: Four times a day (QID) | RESPIRATORY_TRACT | Status: DC | PRN
  Administered 2013-02-28: 2 via RESPIRATORY_TRACT
  Filled 2013-02-28 (×2): qty 6.7

## 2013-02-28 MED ORDER — OXYCODONE HCL 5 MG PO TABS: 5.0000 mg | ORAL_TABLET | ORAL | Status: DC | PRN

## 2013-02-28 NOTE — Progress Notes (Signed)
[facility] PROGRESS NOTE    JIANNI BATTEN ZOX:[id number] DOB: [birth year] DOA: 02/27/2013 PCP: Kerby Nora, MD Primary Cardiologist: Dr. Arvilla Meres  HPI/Brief narrative 79 year old male with history of COPD, hypertension, hyperlipidemia, chronic diastolic CHF, AICD, CAD, A. fib-not on anticoagulation secondary to history of GI bleed, type II DM, reported gout, presented from home with history of pain and swelling of mostly the left forefoot >ankle for the last 3 days. He felt feverish. He states that the pain is similar to his previous episodes of gout. He was admitted as presumed cellulitis versus gout and started empirically on IV clindamycin.  Assessment/Plan:  Left foot/ankle pain, likely acute gouty arthritis - Patient denies any redness and clinically does not appear like cellulitis. - DC clindamycin. - Given history of GI bleed, hesitant to use NSAIDs. Trial of oral prednisone.  A. fib/PPM-AICD/CAD/chronic diastolic CHF - Compensated. V. paced rhythm on monitor.  Hypertension  -  controlled   COPD  - Stable  Anemia and mild leukocytosis -Follow CBC in a.m.    DVT prophylaxis: Lovenox Code Status: Full Family Communication: Brother at bedside Disposition Plan:  possible discharge 11/19   Consultants:   none  Procedures:   none  Antibiotics:   IV clindamycin and DC'd 11/18   Subjective:  left foot and ankle pain is much better.  Objective: Filed Vitals:   02/28/13 0050 02/28/13 0326 02/28/13 0629 02/28/13 0934  BP: 123/71  115/72   Pulse: 73  70   Temp: 97.9 F (36.6 C)  98 F (36.7 C)   TempSrc: Oral  Oral   Resp:      Height: 5\' 7"  (1.702 m)     Weight: 86.32 kg (190 lb 4.8 oz) 86.4 kg (190 lb 7.6 oz)    SpO2: 94%  93% 96%    Intake/Output Summary (Last 24 hours) at 02/28/13 1340 Last data filed at 02/28/13 0700  Gross per 24 hour  Intake 561.67 ml  Output    650 ml  Net -88.33 ml   Filed Weights   02/28/13 0050  02/28/13 0326  Weight: 86.32 kg (190 lb 4.8 oz) 86.4 kg (190 lb 7.6 oz)     Exam:  General exam:  comfortable elderly male sitting in bed. Respiratory system: Clear. No increased work of breathing. Cardiovascular system: S1 & S2 heard, RRR. No JVD, murmurs, gallops, clicks or pedal edema. telemetry: V. paced rhythm.  Gastrointestinal system: Abdomen is nondistended, soft and nontender. Normal bowel sounds heard. Central nervous system: Alert and oriented. No focal neurological deficits. Extremities: Symmetric 5 x 5 power. left forefoot & ankle mildly swollen and tender but no other signs of acute infection.    Data Reviewed: Basic Metabolic Panel:  Recent Labs Lab 02/27/13 1235 02/28/13 0555  NA 136 138  K 3.6 3.8  CL 98 101  CO2 28 24  GLUCOSE 184* 103*  BUN 18 21  CREATININE 1.13 0.97  CALCIUM 10.1 9.3   Liver Function Tests:  Recent Labs Lab 02/28/13 0555  AST 25  ALT 26  ALKPHOS 91  BILITOT 0.4  PROT 6.6  ALBUMIN 3.2*   No results found for this basename: LIPASE, AMYLASE,  in the last 168 hours No results found for this basename: AMMONIA,  in the last 168 hours CBC:  Recent Labs Lab 02/27/13 1235 02/28/13 0555  WBC 14.3* 12.0*  NEUTROABS 9.6*  --   HGB 12.4* 10.9*  HCT 35.8* 33.1*  MCV 91.8 93.5  PLT 215 208  Cardiac Enzymes: No results found for this basename: CKTOTAL, CKMB, CKMBINDEX, TROPONINI,  in the last 168 hours BNP (last 3 results) No results found for this basename: PROBNP,  in the last 8760 hours CBG: No results found for this basename: GLUCAP,  in the last 168 hours  No results found for this or any previous visit (from the past 240 hour(s)).    1.      Studies: Dg Foot Complete Left  02/27/2013   CLINICAL DATA:  Left foot pain and swelling  EXAM: LEFT FOOT - COMPLETE 3+ VIEW  COMPARISON:  None.  FINDINGS: No fracture dislocation.  There is a spur projecting from the plantar lateral margin of the base of the proximal phalanx  of the great toe. Remaining joints are unremarkable appear The bones are demineralized.  Soft tissues show mild diffuse dorsal soft tissue swelling mostly of the forefoot.  IMPRESSION: No fracture or acute bony abnormality. Nonspecific soft tissue swelling.   Electronically Signed   By: Amie Portland M.D.   On: 02/27/2013 14:07        Scheduled Meds: . aspirin  81 mg Oral BID  . atorvastatin  20 mg Oral Daily  . [START ON 03/01/2013] carvedilol  3.125 mg Oral BID WC  . clindamycin (CLEOCIN) IV  600 mg Intravenous Q8H  . colchicine  0.6 mg Oral BID  . dutasteride  0.5 mg Oral Daily  . enoxaparin (LOVENOX) injection  40 mg Subcutaneous Q24H  . ferrous sulfate  325 mg Oral BID WC  . gabapentin  300 mg Oral QID  . pantoprazole  40 mg Oral Daily  . sodium chloride  3 mL Intravenous Q12H  . tamsulosin  0.4 mg Oral QPC supper  . tiotropium  18 mcg Inhalation Daily   Continuous Infusions:   Principal Problem:   Cellulitis Active Problems:   DIABETES MELLITUS, TYPE II   Atrial fibrillation   Essential hypertension, benign    Time spent: 35 minutes.    Marcellus Scott, MD, FACP, FHM. [facility] Pager [phone]  If 7PM-7AM, please contact night-coverage [email or web address] Password [email or web address] 02/28/2013, 1:40 PM    LOS: 1 day

## 2013-02-28 NOTE — Progress Notes (Signed)
Patient was admitted to the unit via stretcher from the ED. Patient is alert and oriented but is bilaterally HOH. Patient has no skin issues but does have some slight edema to the left leg and dryness to the feet. Patietn was oriented to room, bed placed in lowest position with call bell within reach. Tele box #19 placed and verified with CMT. Will continue to monitor.   [name] J. Lendell Caprice RN

## 2013-03-01 DIAGNOSIS — I5032 Chronic diastolic (congestive) heart failure: Secondary | ICD-10-CM

## 2013-03-01 DIAGNOSIS — E119 Type 2 diabetes mellitus without complications: Secondary | ICD-10-CM

## 2013-03-01 LAB — CBC
HCT: 32.6 % — ABNORMAL LOW (ref 39.0–52.0)
MCV: 92.1 fL (ref 78.0–100.0)
Platelets: 251 10*3/uL (ref 150–400)
RBC: 3.54 MIL/uL — ABNORMAL LOW (ref 4.22–5.81)
WBC: 11.6 10*3/uL — ABNORMAL HIGH (ref 4.0–10.5)

## 2013-03-01 LAB — GLUCOSE, CAPILLARY: Glucose-Capillary: 181 mg/dL — ABNORMAL HIGH (ref 70–99)

## 2013-03-01 MED ORDER — PREDNISONE 10 MG PO TABS: ORAL_TABLET | ORAL | Status: DC

## 2013-03-01 NOTE — Discharge Summary (Signed)
Physician Discharge Summary  [name] UJW:[id number] DOB: [birth year] DOA: 02/27/2013  PCP: Kerby Nora, MD  Admit date: 02/27/2013 Discharge date: 03/01/2013  Time spent: Less than 30 minutes  Recommendations for Outpatient Follow-up:  1. Dr. Kerby Nora, PCP in 1 week with repeat labs (CBC & BMP) 2. Follow up final blood cultures which were drawn in th hospital on 02/27/2013.  Discharge Diagnoses:  Active Problems:   DIABETES MELLITUS, TYPE II   Atrial fibrillation   Essential hypertension, benign   Acute gouty arthritis   Discharge Condition: Improved & Stable  Diet recommendation: Heart Healthy & Diabetic diet.  Filed Weights   02/28/13 0050 02/28/13 0326 03/01/13 0543  Weight: 86.32 kg (190 lb 4.8 oz) 86.4 kg (190 lb 7.6 oz) 86.274 kg (190 lb 3.2 oz)    History of present illness & Hospital course:  79 year old male with history of COPD, hypertension, hyperlipidemia, chronic diastolic CHF, AICD, CAD, A. fib-not on anticoagulation secondary to history of GI bleed, type II DM, reported gout, presented from home with history of pain and swelling of mostly the left forefoot >ankle for the last 3 days. He felt feverish. He states that the pain is similar to his previous episodes of gout. He was admitted as presumed cellulitis versus gout and started empirically on IV clindamycin. On further evaluation, his presentation seemed more consistent with acute gouty arthritis. Clindamycin was DC'ed. He was started on oral prednisone taper. Due to history of GI bleed, NSAID's were avoided. His arthritis improved significantly and he was able to weight bear. He & his family declined PT evaluation. His other medical/cardiac conditions were compensated/stable. Anemia & mild leukocytosis were stable and can be follow up as OP.   Consultations:  None  Procedures:  None    Discharge Exam:  Complaints:  Left foot and ankle pain almost resolved and able to weight bear.  Filed  Vitals:   02/28/13 2016 03/01/13 0543 03/01/13 0736 03/01/13 0825  BP: 141/62 118/68  135/81  Pulse: 70 69  70  Temp: 97.5 F (36.4 C) 97.7 F (36.5 C)    TempSrc: Axillary Axillary    Resp:      Height:      Weight:  86.274 kg (190 lb 3.2 oz)    SpO2: 100% 99% 98%     General exam: comfortable elderly male sitting in bed.  Respiratory system: Clear. No increased work of breathing.  Cardiovascular system: S1 & S2 heard, RRR. No JVD, murmurs, gallops, clicks or pedal edema. Gastrointestinal system: Abdomen is nondistended, soft and nontender. Normal bowel sounds heard.  Central nervous system: Alert and oriented. No focal neurological deficits.  Extremities: Symmetric 5 x 5 power. left forefoot & ankle mildly swollen and tender- all much better & no other signs of acute infection.    Discharge Instructions  Discharge Orders   Future Appointments Provider Department Dept Phone   05/29/2013 8:00 AM [facility] [place] Office [phone]   Future Orders Complete By Expires   (HEART FAILURE PATIENTS) Call MD:  Anytime you have any of the following symptoms: 1) 3 pound weight gain in 24 hours or 5 pounds in 1 week 2) shortness of breath, with or without a dry hacking cough 3) swelling in the hands, feet or stomach 4) if you have to sleep on extra pillows at night in order to breathe.  As directed    Call MD for:  difficulty breathing, headache or visual disturbances  As directed  Call MD for:  extreme fatigue  As directed    Call MD for:  persistant dizziness or light-headedness  As directed    Call MD for:  redness, tenderness, or signs of infection (pain, swelling, redness, odor or green/yellow discharge around incision site)  As directed    Call MD for:  severe uncontrolled pain  As directed    Call MD for:  temperature >100.4  As directed    Diet - low sodium heart healthy  As directed    Diet Carb Modified  As directed    Increase activity slowly   As directed        Medication List         ACCU-CHEK AVIVA PLUS test strip  Generic drug:  glucose blood  USE AND DISCARD 1 TEST     STRIP DAILY AND AS NEEDED  FOR DIABETES     albuterol 108 (90 BASE) MCG/ACT inhaler  Commonly known as:  PROVENTIL HFA;VENTOLIN HFA  Inhale 2 puffs into the lungs every 6 (six) hours as needed for wheezing.     aspirin 81 MG tablet  Take 81 mg by mouth 2 (two) times daily.     atorvastatin 20 MG tablet  Commonly known as:  LIPITOR  Take 20 mg by mouth daily.     carvedilol 3.125 MG tablet  Commonly known as:  COREG  Take 1 tablet (3.125 mg total) by mouth 2 (two) times daily with a meal.     colchicine 0.6 MG tablet  Take 1 tablet (0.6 mg total) by mouth 2 (two) times daily.     dexlansoprazole 60 MG capsule  Commonly known as:  DEXILANT  Take 60 mg by mouth daily.     dutasteride 0.5 MG capsule  Commonly known as:  AVODART  Take 1 capsule (0.5 mg total) by mouth daily.     eplerenone 25 MG tablet  Commonly known as:  INSPRA  Take 25 mg by mouth daily.     ferrous sulfate 325 (65 FE) MG tablet  Take 325 mg by mouth 2 (two) times daily with a meal.     furosemide 40 MG tablet  Commonly known as:  LASIX  Take 40 mg by mouth daily.     gabapentin 300 MG capsule  Commonly known as:  NEURONTIN  Take 1 capsule (300 mg total) by mouth 4 (four) times daily.     guaiFENesin 600 MG 12 hr tablet  Commonly known as:  MUCINEX  Take 1,200 mg by mouth 2 (two) times daily as needed for cough.     lisinopril 2.5 MG tablet  Commonly known as:  PRINIVIL,ZESTRIL  Take 1 tablet (2.5 mg total) by mouth daily.     potassium chloride SA 20 MEQ tablet  Commonly known as:  K-DUR,KLOR-CON  Take 20 mEq by mouth 2 (two) times daily.     predniSONE 10 MG tablet  Commonly known as:  DELTASONE  Take 3 tabs daily x1 day, then 2 tabs daily x3 days, then 1 tab daily x3 days, then stop     tamsulosin 0.4 MG Caps capsule  Commonly known as:  FLOMAX   Take 0.4 mg by mouth daily after supper.     TH VITAMIN D3 2000 UNITS Caps  Generic drug:  Cholecalciferol  Take 2,000 Units by mouth daily.     tiotropium 18 MCG inhalation capsule  Commonly known as:  SPIRIVA  Place 18 mcg into inhaler and inhale daily.  traMADol 50 MG tablet  Commonly known as:  ULTRAM  Take 50 mg by mouth every 6 (six) hours as needed. For pain     TYLENOL 500 MG tablet  Generic drug:  acetaminophen  Take 500 mg by mouth every 6 (six) hours as needed for mild pain.     Vitamin B-12 2500 MCG Subl  Place 2,500 mcg under the tongue daily.           Follow-up Information   Follow up with Kerby Nora, MD. Schedule an appointment as soon as possible for a visit in 1 week. (To be seen with repeat labs (CBC & BMP))    Specialty:  Family Medicine   Contact information:   [address] [place] [address] [phone]        The results of significant diagnostics from this hospitalization (including imaging, microbiology, ancillary and laboratory) are listed below for reference.    Significant Diagnostic Studies: Dg Foot Complete Left  02/27/2013   CLINICAL DATA:  Left foot pain and swelling  EXAM: LEFT FOOT - COMPLETE 3+ VIEW  COMPARISON:  None.  FINDINGS: No fracture dislocation.  There is a spur projecting from the plantar lateral margin of the base of the proximal phalanx of the great toe. Remaining joints are unremarkable appear The bones are demineralized.  Soft tissues show mild diffuse dorsal soft tissue swelling mostly of the forefoot.  IMPRESSION: No fracture or acute bony abnormality. Nonspecific soft tissue swelling.   Electronically Signed   By: Amie Portland M.D.   On: 02/27/2013 14:07    Microbiology: Recent Results (from the past 240 hour(s))  CULTURE, BLOOD (ROUTINE X 2)     Status: None   Collection Time    02/27/13  7:45 PM      Result Value Range Status   Specimen Description BLOOD ARM RIGHT    Final   Special Requests BOTTLES DRAWN AEROBIC ONLY 4CC   Final   Culture  Setup Time     Final   Value: 02/28/2013 01:23     Performed at [facility]   Culture     Final   Value:        BLOOD CULTURE RECEIVED NO GROWTH TO DATE CULTURE WILL BE HELD FOR 5 DAYS BEFORE ISSUING A FINAL NEGATIVE REPORT     Performed at [facility]   Report Status PENDING   Incomplete  CULTURE, BLOOD (ROUTINE X 2)     Status: None   Collection Time    02/27/13  7:53 PM      Result Value Range Status   Specimen Description BLOOD HAND LEFT   Final   Special Requests BOTTLES DRAWN AEROBIC ONLY 4CC   Final   Culture  Setup Time     Final   Value: 02/28/2013 01:23     Performed at [facility]   Culture     Final   Value:        BLOOD CULTURE RECEIVED NO GROWTH TO DATE CULTURE WILL BE HELD FOR 5 DAYS BEFORE ISSUING A FINAL NEGATIVE REPORT     Performed at [facility]   Report Status PENDING   Incomplete     Labs: Basic Metabolic Panel:  Recent Labs Lab 02/27/13 1235 02/28/13 0555  NA 136 138  K 3.6 3.8  CL 98 101  CO2 28 24  GLUCOSE 184* 103*  BUN 18 21  CREATININE 1.13 0.97  CALCIUM 10.1 9.3   Liver Function Tests:  Recent Labs Lab 02/28/13 0555  AST 25  ALT 26  ALKPHOS 91  BILITOT 0.4  PROT 6.6  ALBUMIN 3.2*   No results found for this basename: LIPASE, AMYLASE,  in the last 168 hours No results found for this basename: AMMONIA,  in the last 168 hours CBC:  Recent Labs Lab 02/27/13 1235 02/28/13 0555 03/01/13 0517  WBC 14.3* 12.0* 11.6*  NEUTROABS 9.6*  --   --   HGB 12.4* 10.9* 10.9*  HCT 35.8* 33.1* 32.6*  MCV 91.8 93.5 92.1  PLT 215 208 251   Cardiac Enzymes: No results found for this basename: CKTOTAL, CKMB, CKMBINDEX, TROPONINI,  in the last 168 hours BNP: BNP (last 3 results) No results found for this basename: PROBNP,  in the last 8760 hours CBG:  Recent Labs Lab 02/28/13 1713 02/28/13 2018 03/01/13 0739  GLUCAP 110* 206*  181*    Additional labs: 1. Uric Acid: 7.6   Signed:  Braxtyn Dorff, MD, FACP, FHM. [facility] Pager [phone]  If 7PM-7AM, please contact night-coverage [email or web address] Password [email or web address] 03/01/2013, 1:46 PM

## 2013-03-01 NOTE — Progress Notes (Signed)
Inpatient Diabetes Program Recommendations  AACE/ADA: New Consensus Statement on Inpatient Glycemic Control (2013)  Target Ranges:  Prepandial:   less than 140 mg/dL      Peak postprandial:   less than 180 mg/dL (1-2 hours)      Critically ill patients:  140 - 180 mg/dL   Reason for Visit: Results for KLEIN, WILLCOX (MRN [id number]) as of 03/01/2013 11:09  Ref. Range 02/28/2013 17:13 02/28/2013 20:18 03/01/2013 07:39  Glucose-Capillary Latest Range: 70-99 mg/dL 413 (H) 244 (H) 010 (H)    Note history of diabetes.  Consider starting Novolog sensitive tid with meals via Glycemic control order set.  Will follow. Thanks, Beryl Meager, RN, BC-ADM Inpatient Diabetes Coordinator Pager [phone]

## 2013-03-01 NOTE — Progress Notes (Signed)
Per patients daughter at bedside states patient does not do therapy and evaluation has been refused.

## 2013-03-05 ENCOUNTER — Other Ambulatory Visit: Payer: Self-pay | Admitting: Family Medicine

## 2013-03-06 LAB — CULTURE, BLOOD (ROUTINE X 2): Culture: NO GROWTH

## 2013-03-07 ENCOUNTER — Ambulatory Visit (INDEPENDENT_AMBULATORY_CARE_PROVIDER_SITE_OTHER): Payer: Medicare Other | Admitting: Family Medicine

## 2013-03-07 ENCOUNTER — Encounter: Payer: Self-pay | Admitting: Family Medicine

## 2013-03-07 VITALS — BP 130/78 | HR 76 | Temp 97.5°F | Wt 192.0 lb

## 2013-03-07 DIAGNOSIS — M109 Gout, unspecified: Secondary | ICD-10-CM | POA: Diagnosis not present

## 2013-03-07 DIAGNOSIS — R5381 Other malaise: Secondary | ICD-10-CM | POA: Diagnosis not present

## 2013-03-07 NOTE — Progress Notes (Signed)
  Subjective:    Patient ID: [name], male    DOB: Sep 21, 1930, 79 y.o.   MRN: [id number]  HPI  79 year old male with history of COPD, hypertension, hyperlipidemia, chronic diastolic CHF, AICD, CAD, A. fib-not on anticoagulation secondary to history of GI bleed, type II DM, reported gout, comes to clinic today following hosptial admission on 11/15 for pain and swelling of mostly the left forefoot >ankle for the  3 days. He had  felt feverish. He stated that the pain was similar to his previous episodes of gout. He was admitted as presumed cellulitis versus gout and started empirically on IV clindamycin.   Neg dopplers on left foot: DVT.  X-ray was negative.  He was discharged on 11/17 with dx of gout on related with prednsione. Conontued colchicine twice daily.  Using tramadol as needed.  He report pain in his foot is resolved and swelling is much better.   He is feeling well otherwise.       Review of Systems  Constitutional: Negative for fever and fatigue.  HENT: Negative for ear pain.   Eyes: Negative for pain.  Respiratory: Negative for cough and shortness of breath.   Cardiovascular: Positive for leg swelling. Negative for chest pain.  Gastrointestinal: Negative for abdominal pain.       Objective:   Physical Exam  Constitutional: Vital signs are normal. He appears well-developed and well-nourished.  HENT:  Head: Normocephalic.  Right Ear: Hearing normal.  Left Ear: Hearing normal.  Nose: Nose normal.  Mouth/Throat: Oropharynx is clear and moist and mucous membranes are normal.  Neck: Trachea normal. Carotid bruit is not present. No mass and no thyromegaly present.  Cardiovascular: Normal rate, regular rhythm and normal pulses.  Exam reveals no gallop, no distant heart sounds and no friction rub.   No murmur heard. No peripheral edema  Pulmonary/Chest: Effort normal and breath sounds normal. No respiratory distress.  Musculoskeletal:  Mild left lower leg  swelling, no pain to palpation.No erythema, very dry skin, B varicose veins.    Skin: Skin is warm, dry and intact. No rash noted.  Well healed defibrillator scar  Psychiatric: He has a normal mood and affect. His speech is normal and behavior is normal. Thought content normal.          Assessment & Plan:

## 2013-03-07 NOTE — Assessment & Plan Note (Signed)
Resolving.  Complete prednisone, continue colchicine.

## 2013-03-07 NOTE — Patient Instructions (Signed)
Complete prednisone. Continue colchicine. Use tramadol as needed. Follow up in 6 months for annual medicare wellness.

## 2013-03-07 NOTE — Assessment & Plan Note (Signed)
Improved with pacemaker in place.

## 2013-03-13 ENCOUNTER — Telehealth: Payer: Self-pay | Admitting: Family Medicine

## 2013-03-13 NOTE — Telephone Encounter (Signed)
Attempted transitional care call on 11/21 and 11/22 without an answer.  Pt was seen in office on 11/25.

## 2013-03-15 ENCOUNTER — Ambulatory Visit (INDEPENDENT_AMBULATORY_CARE_PROVIDER_SITE_OTHER): Payer: Medicare Other | Admitting: Family Medicine

## 2013-03-15 ENCOUNTER — Encounter: Payer: Self-pay | Admitting: Family Medicine

## 2013-03-15 VITALS — BP 116/74 | HR 80 | Temp 97.4°F | Wt 190.5 lb

## 2013-03-15 DIAGNOSIS — J441 Chronic obstructive pulmonary disease with (acute) exacerbation: Secondary | ICD-10-CM | POA: Diagnosis not present

## 2013-03-15 DIAGNOSIS — J440 Chronic obstructive pulmonary disease with acute lower respiratory infection: Secondary | ICD-10-CM

## 2013-03-15 DIAGNOSIS — J209 Acute bronchitis, unspecified: Secondary | ICD-10-CM

## 2013-03-15 MED ORDER — DOXYCYCLINE HYCLATE 100 MG PO TABS: 100.0000 mg | ORAL_TABLET | Freq: Two times a day (BID) | ORAL | Status: DC

## 2013-03-15 MED ORDER — PREDNISONE 20 MG PO TABS: ORAL_TABLET | ORAL | Status: DC

## 2013-03-15 NOTE — Progress Notes (Signed)
Date:  03/15/2013   Name:  [name]   DOB:  [birth year]   MRN:  [id number] Gender: male Age: 79 y.o.  Primary Physician:  Kerby Nora, MD   Chief Complaint: Cough   Subjective:   History of Present Illness:  [name] is a 79 y.o. very pleasant male patient who presents with the following:  Light green sputum - has been sick for a week.   COPD AF, no blood thinners \ Pleasant gentleman with multiple medical problems including COPD and atrial fibrillation who presents with worsening shortness of breath, wheezing, and cough productive of sputum over the last week. He is getting short of breath more easily. He is currently afebrile. He is not having too much nasal drainage or sinus congestion. No earache. No nausea, vomiting, or diarrhea.  Past Medical History, Surgical History, Social History, Family History, Problem List, Medications, and Allergies have been reviewed and updated if relevant.  Review of Systems: ROS: GEN: Acute illness details above GI: Tolerating PO intake GU: maintaining adequate hydration and urination Pulm: +sob Interactive and getting along well at home.  Otherwise, ROS is as per the HPI.   Objective:   Physical Examination: BP 116/74  Pulse 80  Temp(Src) 97.4 F (36.3 C) (Oral)  Wt 190 lb 8 oz (86.41 kg)   GEN: A and O x 3. WDWN. NAD.    ENT: Nose clear, ext NML.  No LAD.  No JVD.  TM's clear. Oropharynx clear.  PULM: Normal WOB, no distress. Scattered wheezing CV: irreg, irreg EXT: warm and well-perfused, No c/c/e. PSYCH: Pleasant and conversant.    Laboratory and Imaging Data:  Assessment & Plan:    COPD exacerbation  Bronchitis, chronic obstructive w acute bronchitis  Acute bronchitis: discussed plan of care. Given length of symptoms and overall history, will treat with ABX in this case. Continue with additional supportive care, cough medications, liquids, sleep, steam / vaporizer. Pred for copd  There are no  Patient Instructions on file for this visit.  Orders Today:  No orders of the defined types were placed in this encounter.    New medications, updates to list, dose adjustments: Meds ordered this encounter  Medications  . doxycycline (VIBRA-TABS) 100 MG tablet    Sig: Take 1 tablet (100 mg total) by mouth 2 (two) times daily.    Dispense:  20 tablet    Refill:  0  . predniSONE (DELTASONE) 20 MG tablet    Sig: 2 tablets po for 5 days, then 1 po for 4 days    Dispense:  14 tablet    Refill:  0    Signed,  Diane Mochizuki T. Fareedah Mahler, MD, CAQ Sports Medicine  [facility] [address] [place] [address] Phone: [phone] Fax: [phone]  Updated Complete Medication List:   Medication List       This list is accurate as of: 03/15/13 10:23 AM.  Always use your most recent med list.               ACCU-CHEK AVIVA PLUS test strip  Generic drug:  glucose blood  USE AND DISCARD 1 TEST     STRIP DAILY AND AS NEEDED  FOR DIABETES     albuterol 108 (90 BASE) MCG/ACT inhaler  Commonly known as:  PROVENTIL HFA;VENTOLIN HFA  Inhale 2 puffs into the lungs every 6 (six) hours as needed for wheezing.     aspirin 81 MG tablet  Take 81  mg by mouth 2 (two) times daily.     atorvastatin 20 MG tablet  Commonly known as:  LIPITOR  Take 20 mg by mouth daily.     carvedilol 3.125 MG tablet  Commonly known as:  COREG  Take 1 tablet (3.125 mg total) by mouth 2 (two) times daily with a meal.     colchicine 0.6 MG tablet  Take 1 tablet (0.6 mg total) by mouth 2 (two) times daily.     dexlansoprazole 60 MG capsule  Commonly known as:  DEXILANT  Take 60 mg by mouth daily.     doxycycline 100 MG tablet  Commonly known as:  VIBRA-TABS  Take 1 tablet (100 mg total) by mouth 2 (two) times daily.     dutasteride 0.5 MG capsule  Commonly known as:  AVODART  Take 1 capsule (0.5 mg total) by mouth daily.     eplerenone 25 MG tablet  Commonly known as:  INSPRA    Take 25 mg by mouth daily.     ferrous sulfate 325 (65 FE) MG tablet  Take 325 mg by mouth 2 (two) times daily with a meal.     furosemide 40 MG tablet  Commonly known as:  LASIX  TAKE 1 TABLET DAILY     gabapentin 300 MG capsule  Commonly known as:  NEURONTIN  Take 1 capsule (300 mg total) by mouth 4 (four) times daily.     guaiFENesin 600 MG 12 hr tablet  Commonly known as:  MUCINEX  Take 1,200 mg by mouth 2 (two) times daily as needed for cough.     lisinopril 2.5 MG tablet  Commonly known as:  PRINIVIL,ZESTRIL  Take 1 tablet (2.5 mg total) by mouth daily.     potassium chloride SA 20 MEQ tablet  Commonly known as:  K-DUR,KLOR-CON  Take 20 mEq by mouth 2 (two) times daily.     predniSONE 10 MG tablet  Commonly known as:  DELTASONE  Take 3 tabs daily x1 day, then 2 tabs daily x3 days, then 1 tab daily x3 days, then stop     predniSONE 20 MG tablet  Commonly known as:  DELTASONE  2 tablets po for 5 days, then 1 po for 4 days     tamsulosin 0.4 MG Caps capsule  Commonly known as:  FLOMAX  Take 0.4 mg by mouth daily after supper.     TH VITAMIN D3 2000 UNITS Caps  Generic drug:  Cholecalciferol  Take 2,000 Units by mouth daily.     tiotropium 18 MCG inhalation capsule  Commonly known as:  SPIRIVA  Place 18 mcg into inhaler and inhale daily.     traMADol 50 MG tablet  Commonly known as:  ULTRAM  Take 50 mg by mouth every 6 (six) hours as needed. For pain     TYLENOL 500 MG tablet  Generic drug:  acetaminophen  Take 500 mg by mouth every 6 (six) hours as needed for mild pain.     Vitamin B-12 2500 MCG Subl  Place 2,500 mcg under the tongue daily.

## 2013-03-15 NOTE — Progress Notes (Signed)
Pre-visit discussion using our clinic review tool. No additional management support is needed unless otherwise documented below in the visit note.  

## 2013-03-29 ENCOUNTER — Ambulatory Visit (INDEPENDENT_AMBULATORY_CARE_PROVIDER_SITE_OTHER): Payer: Medicare Other | Admitting: *Deleted

## 2013-03-29 DIAGNOSIS — I5032 Chronic diastolic (congestive) heart failure: Secondary | ICD-10-CM

## 2013-03-29 DIAGNOSIS — I472 Ventricular tachycardia, unspecified: Secondary | ICD-10-CM

## 2013-03-29 LAB — MDC_IDC_ENUM_SESS_TYPE_INCLINIC
Battery Remaining Longevity: 91 mo
Battery Voltage: 3.05 V
Brady Statistic AP VP Percent: 0 %
Brady Statistic AS VP Percent: 99.17 %
Brady Statistic AS VS Percent: 0.83 %
Brady Statistic RA Percent Paced: 0 %
Brady Statistic RV Percent Paced: 99.38 %
Date Time Interrogation Session: 20141217135437
HighPow Impedance: 247 Ohm
HighPow Impedance: 47 Ohm
HighPow Impedance: 59 Ohm
Lead Channel Impedance Value: 4047 Ohm
Lead Channel Impedance Value: 494 Ohm
Lead Channel Impedance Value: 532 Ohm
Lead Channel Pacing Threshold Amplitude: 0.625 V
Lead Channel Pacing Threshold Amplitude: 1.5 V
Lead Channel Pacing Threshold Pulse Width: 0.4 ms
Lead Channel Sensing Intrinsic Amplitude: 0.5 mV
Lead Channel Setting Pacing Amplitude: 2 V
Lead Channel Setting Pacing Amplitude: 2.5 V
Lead Channel Setting Pacing Pulse Width: 0.8 ms
Lead Channel Setting Sensing Sensitivity: 0.3 mV
Zone Setting Detection Interval: 300 ms
Zone Setting Detection Interval: 350 ms
Zone Setting Detection Interval: 450 ms

## 2013-03-29 NOTE — Progress Notes (Signed)
The patient was seen today for c/o "thumping in his back."  I was able to reproduce this with LV threshold testing.  His LV output was reprogrammed to 2.5@0 .8.  Follow up as scheduled with a remote 05/29/13.

## 2013-04-11 ENCOUNTER — Ambulatory Visit (INDEPENDENT_AMBULATORY_CARE_PROVIDER_SITE_OTHER): Payer: Medicare Other | Admitting: Family Medicine

## 2013-04-11 ENCOUNTER — Encounter: Payer: Self-pay | Admitting: Family Medicine

## 2013-04-11 VITALS — BP 86/50 | HR 70 | Temp 98.1°F | Ht 67.0 in | Wt 189.2 lb

## 2013-04-11 DIAGNOSIS — Z23 Encounter for immunization: Secondary | ICD-10-CM

## 2013-04-11 DIAGNOSIS — Z2911 Encounter for prophylactic immunotherapy for respiratory syncytial virus (RSV): Secondary | ICD-10-CM | POA: Diagnosis not present

## 2013-04-11 DIAGNOSIS — J441 Chronic obstructive pulmonary disease with (acute) exacerbation: Secondary | ICD-10-CM

## 2013-04-11 MED ORDER — AZITHROMYCIN 250 MG PO TABS: ORAL_TABLET | ORAL | Status: DC

## 2013-04-11 NOTE — Addendum Note (Signed)
Addended by: Damita Lack on: 04/11/2013 03:24 PM   Modules accepted: Orders

## 2013-04-11 NOTE — Progress Notes (Signed)
   Subjective:    Patient ID: [name], male    DOB: Feb 26, 1931, 79 y.o.   MRN: [id number]  Cough This is a new problem. The current episode started 1 to 4 weeks ago. The problem has been gradually worsening. The problem occurs constantly. The cough is productive of purulent sputum. Associated symptoms include headaches and nasal congestion. Pertinent negatives include no chills, ear congestion, ear pain, fever, hemoptysis, rhinorrhea, sore throat, shortness of breath or wheezing. Associated symptoms comments: Fatigue  ringing in  B ears  no sinus pressure. Risk factors for lung disease include smoking/tobacco exposure. He has tried OTC cough suppressant (mucinex, albuterol inhaler every 12 hours, increased tfrom once daly in last few weeks) for the symptoms. His past medical history is significant for COPD and environmental allergies. There is no history of asthma, bronchiectasis, bronchitis, emphysema or pneumonia.      Review of Systems  Constitutional: Negative for fever and chills.  HENT: Negative for ear pain, rhinorrhea and sore throat.   Respiratory: Positive for cough. Negative for hemoptysis, shortness of breath and wheezing.   Allergic/Immunologic: Positive for environmental allergies.  Neurological: Positive for headaches.       Objective:   Physical Exam  Constitutional: Vital signs are normal. He appears well-developed and well-nourished.  Non-toxic appearance. He does not appear ill. No distress.  HENT:  Head: Normocephalic and atraumatic.  Right Ear: Hearing, tympanic membrane, external ear and ear canal normal. No tenderness. No foreign bodies. Tympanic membrane is not retracted and not bulging.  Left Ear: Hearing, tympanic membrane, external ear and ear canal normal. No tenderness. No foreign bodies. Tympanic membrane is not retracted and not bulging.  Nose: Nose normal. No mucosal edema or rhinorrhea. Right sinus exhibits no maxillary sinus tenderness and no  frontal sinus tenderness. Left sinus exhibits no maxillary sinus tenderness and no frontal sinus tenderness.  Mouth/Throat: Uvula is midline, oropharynx is clear and moist and mucous membranes are normal. Normal dentition. No dental caries. No oropharyngeal exudate or tonsillar abscesses.  Eyes: Conjunctivae, EOM and lids are normal. Pupils are equal, round, and reactive to light. Lids are everted and swept, no foreign bodies found.  Neck: Trachea normal, normal range of motion and phonation normal. Neck supple. Carotid bruit is not present. No mass and no thyromegaly present.  Cardiovascular: Normal rate, regular rhythm, S1 normal, S2 normal, normal heart sounds, intact distal pulses and normal pulses.  Exam reveals no gallop.   No murmur heard. Pulmonary/Chest: Effort normal. No respiratory distress. He has decreased breath sounds. He has no wheezes. He has no rhonchi. He has no rales.  Abdominal: Soft. Normal appearance and bowel sounds are normal. There is no hepatosplenomegaly. There is no tenderness. There is no rebound, no guarding and no CVA tenderness. No hernia.  Neurological: He is alert. He has normal reflexes.  Skin: Skin is warm, dry and intact. No rash noted.  Psychiatric: He has a normal mood and affect. His speech is normal and behavior is normal. Judgment normal.          Assessment & Plan:

## 2013-04-11 NOTE — Progress Notes (Signed)
Pre-visit discussion using our clinic review tool. No additional management support is needed unless otherwise documented below in the visit note.  

## 2013-04-11 NOTE — Patient Instructions (Signed)
Rest, push fluids.  Complete antibiotics. Continue mucinex DM.  Call if you are not improving in the next 48-72 hours or increase in shortness of breath.  Go to ER if severe shortness of breath.

## 2013-04-11 NOTE — Assessment & Plan Note (Signed)
Change in sputum. No current indication for pred taper.  Treat with mucinex antibitoics and albuterol inh as needed.

## 2013-04-17 ENCOUNTER — Other Ambulatory Visit: Payer: Self-pay | Admitting: Family Medicine

## 2013-04-17 IMAGING — CR DG CHEST 2V
2 series · 2 of 2 positions shown · non-contrast
Comparison: 04/21/2011

CLINICAL DATA: Cough, wheezing

CHEST - 2 VIEW

[view not recorded (1 of 2)]
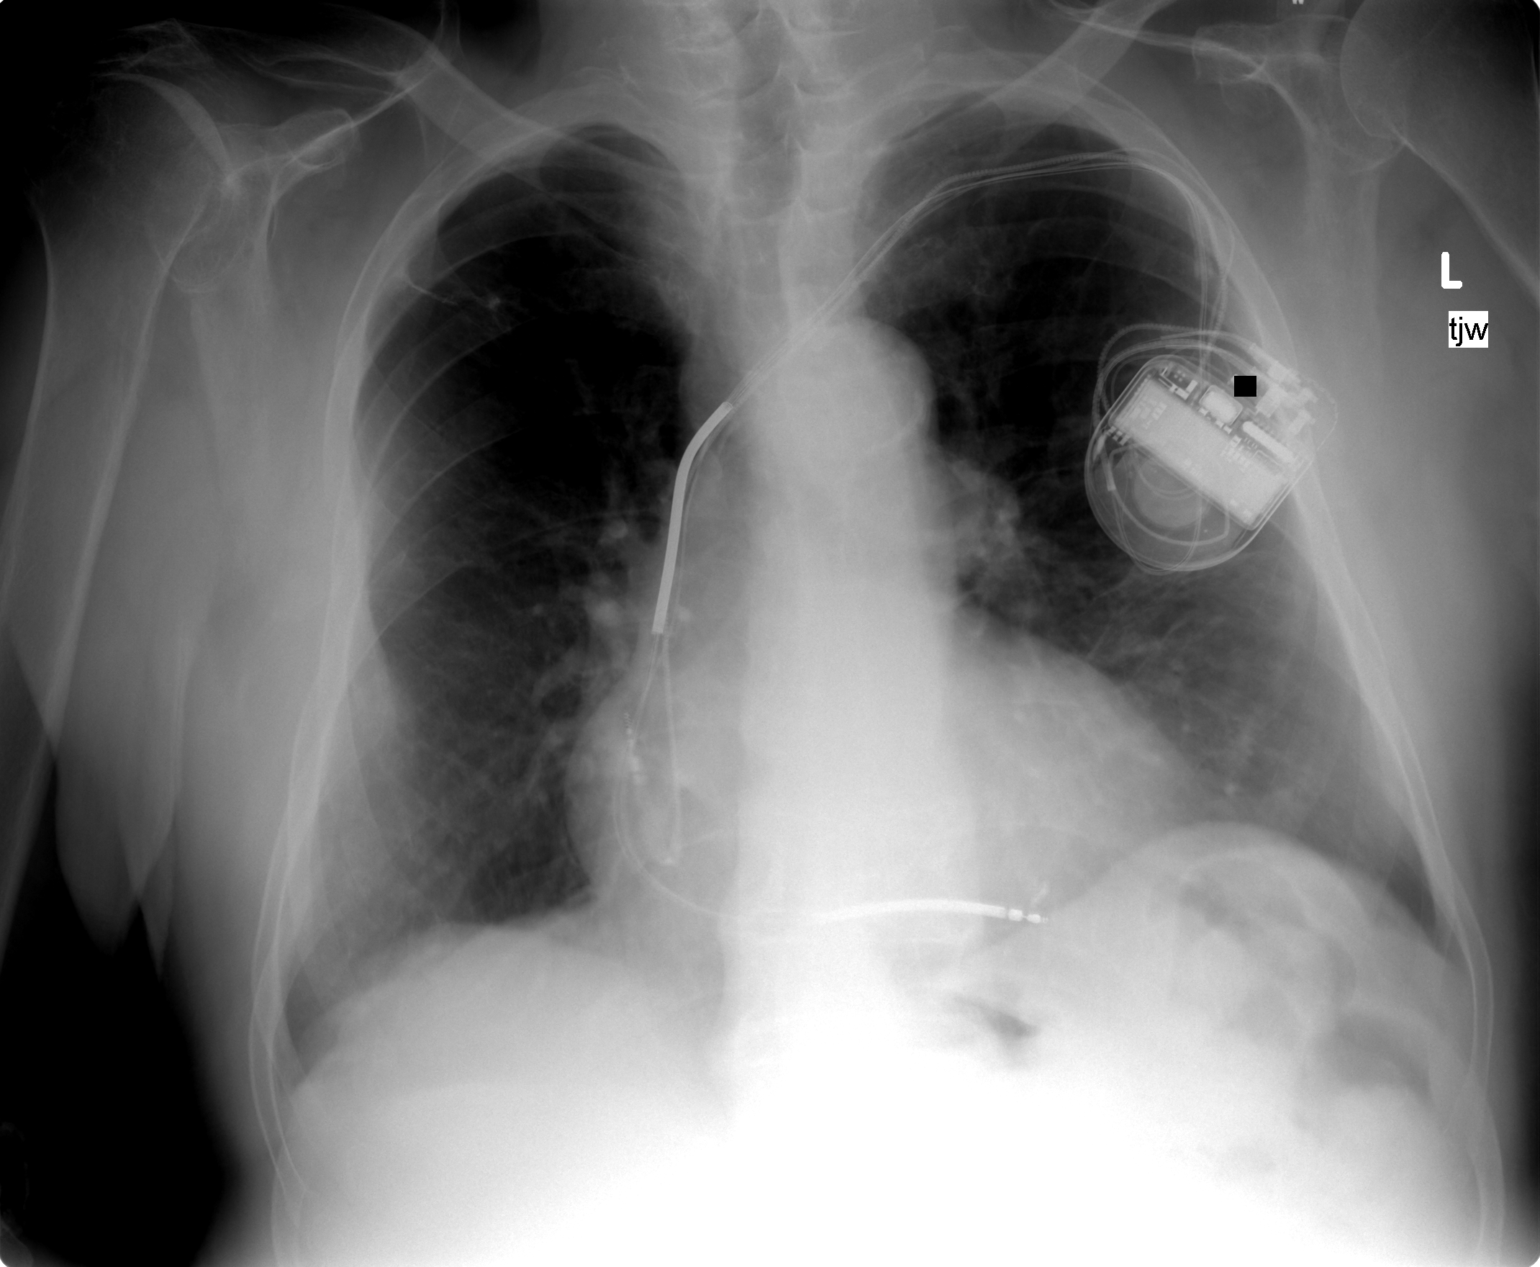

[view not recorded (2 of 2)]
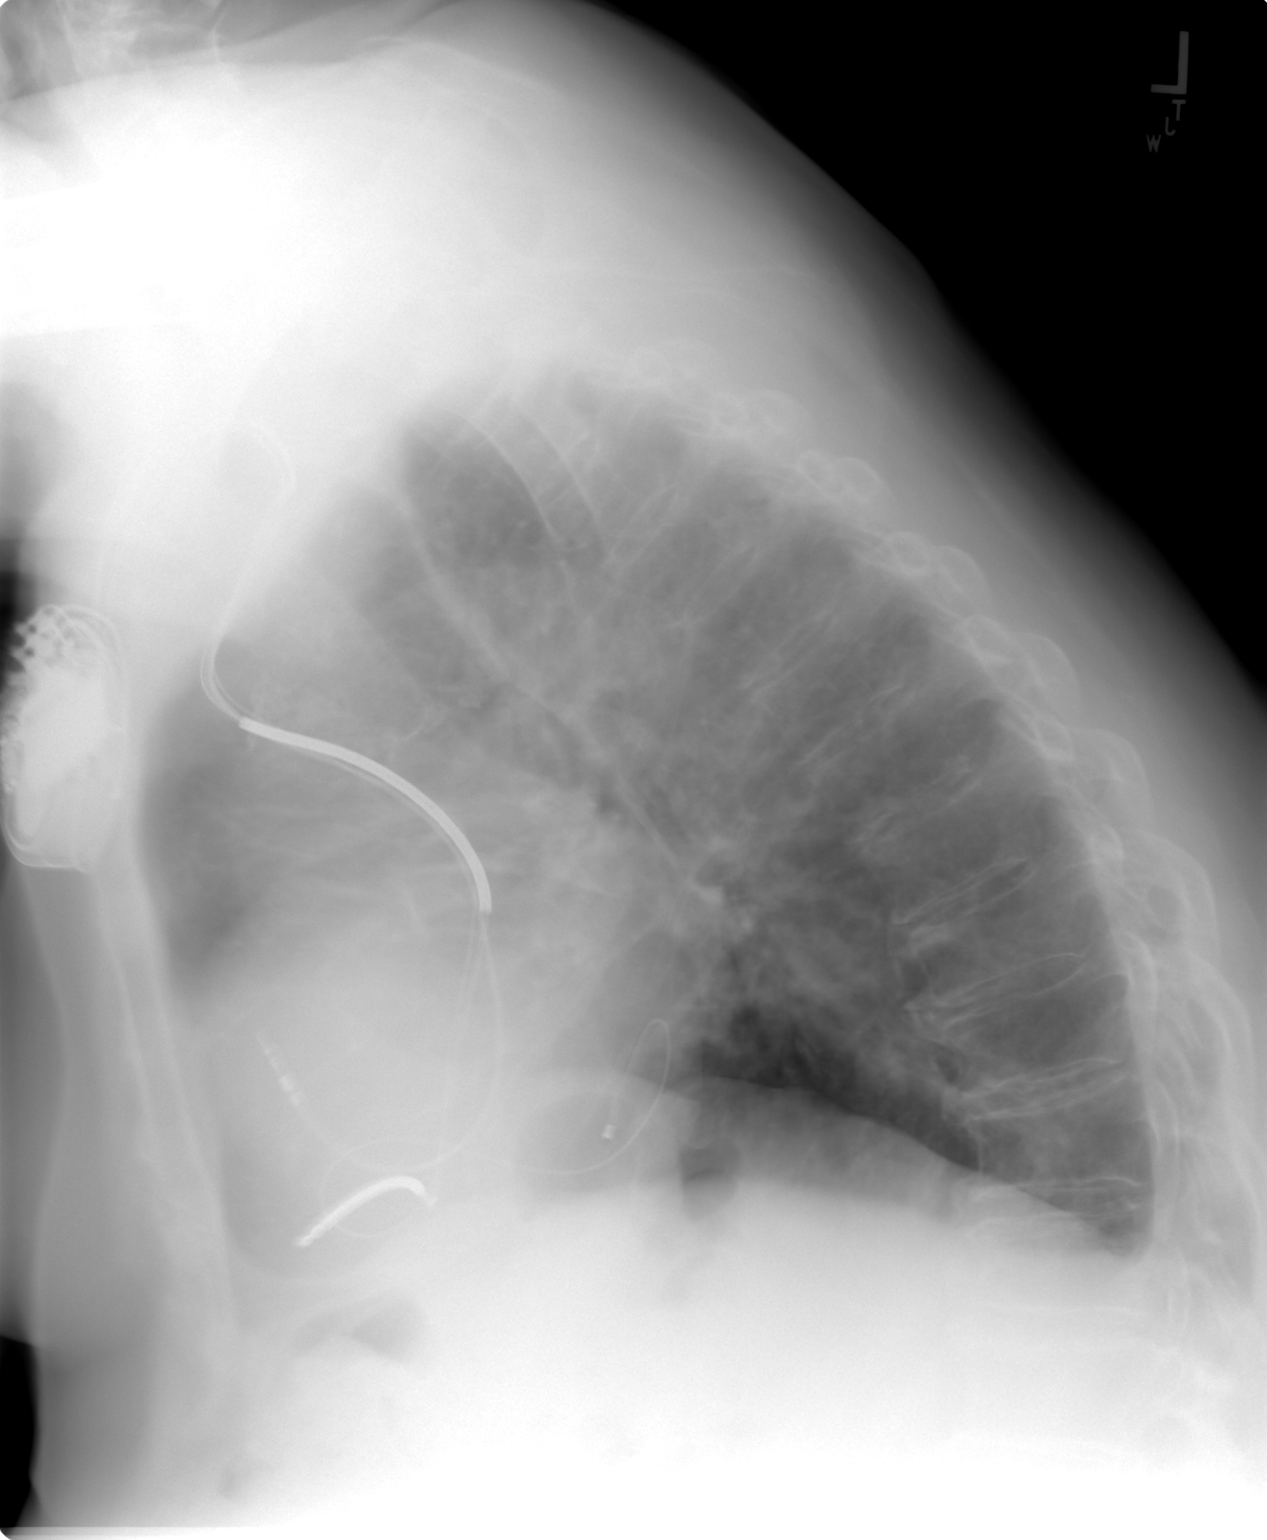

[2 of 2 positions shown; findings below may reference images not displayed]

FINDINGS: Cardiomediastinal silhouette is stable.  3  leads cardiac
pacemaker is unchanged in position.  Stable right upper lobe
scarring or postsurgical changes.  Hyperinflation again noted.
Stable mild degenerative changes thoracic spine.  No acute
infiltrate or pulmonary edema.
IMPRESSION: No active disease.  Hyperinflation again noted.  Three leads
cardiac pacemaker is unchanged in position.

## 2013-04-19 ENCOUNTER — Encounter (HOSPITAL_COMMUNITY): Payer: Medicare Other

## 2013-04-20 ENCOUNTER — Encounter (HOSPITAL_COMMUNITY): Payer: Self-pay

## 2013-04-20 ENCOUNTER — Ambulatory Visit (HOSPITAL_COMMUNITY)
Admission: RE | Admit: 2013-04-20 | Discharge: 2013-04-20 | Disposition: A | Discharge status: Home / Self Care | Payer: Medicare Other | Source: Ambulatory Visit | Attending: Internal Medicine | Admitting: Internal Medicine

## 2013-04-20 VITALS — BP 132/70 | HR 71 | Wt 190.8 lb

## 2013-04-20 DIAGNOSIS — F17200 Nicotine dependence, unspecified, uncomplicated: Secondary | ICD-10-CM | POA: Diagnosis not present

## 2013-04-20 DIAGNOSIS — I10 Essential (primary) hypertension: Secondary | ICD-10-CM | POA: Diagnosis not present

## 2013-04-20 DIAGNOSIS — I4891 Unspecified atrial fibrillation: Secondary | ICD-10-CM | POA: Diagnosis not present

## 2013-04-20 DIAGNOSIS — I428 Other cardiomyopathies: Secondary | ICD-10-CM | POA: Diagnosis not present

## 2013-04-20 NOTE — Patient Instructions (Signed)
Follow up in 1 year.

## 2013-04-20 NOTE — Progress Notes (Signed)
Patient ID: [name], male   DOB: 1930-09-21, 80 y.o.   MRN: [id number] EP: Dr Caryl Comes  PCP: Dr Ronnald Ramp  HPI: [name] is a an 80 year-old male with a history of a chronic systolic heart failure, NICM,  recovered ejection fraction he is also has h/o chronic atrial fib status post AV node ablation and  BIV ICD implantation (not on coumadin due to h/o GIB). He also has a history of COPD, diabetes, hypertension and hyperlipidemia.   In November 2010, he had syncopal episode and ICD showed VF with appropriate therapy.   Underwent ICD change out in Jan 2011 for ERI without problem. Had echo Feb 2011 ef 55-65%. Had some soreness over site but no drainage or skin breakdown.   In November 2014 he was admitted to [facility] and treated for gout.   He returns for yearly follow up. Overall feeling pretty good but just got over a cold. Completed z-pack. Denies PND/Orthopnea. Mild dyspnea with exertion. Ambulates with a cane. Complains of chronic back pain. Weight at home 185-186 pounds. Compliant with medications. Smokes 1 1/2 ppd.   Labs 02/28/13 K 3.8 Creatinine 0.97  SH: Smoke 1 PPD. Does not drink alcohol. Lives with wife   FH: Father and Mother had heart failure . Sister HTN Obesity   ROS: All systems negative except as listed in HPI, PMH and Problem List.  Past Medical History  Diagnosis Date  . COPD (chronic obstructive pulmonary disease)     GOLD II; Spirometry 07/10/2008 >FEV1 1.46 56% predicted, ratio of 66%  . Anemia     iron deficiency anemia with previous severe GI bleed   . Hypertension   . Hyperlipidemia   . Left bundle branch block   . Prostatic hypertrophy 09-11-97    Benign  . Obesity   . CHF (congestive heart failure)     secondary to nonischemic cardiomyopathy; ECHO 10/07 EF 20-25%, mod to severe MR; ECHO 1/10 EF 55-60%, mild MR; ECHO 2/11 55-65%, grade 1 diast dysfxn, miod to mod LAE, mild TR;    S/P Medtronic BiV ICD with biventricular function now turned off due to  diahragmatic simulation  . CAD (coronary artery disease)     Mild, nonobstructive (LHC 1/07: mLAD 20%, pCFX 20-30%, mRCA 30%, EF 25%)  . Atrial fibrillation     s/p AV node ablation; not on coumadin due to GIB  . Cardiac arrest - ventricular fibrillation 02/2008    Aborted, shocked by ICD  . Dyspnea     exertional  . Pneumonia     01/2011  . Diabetes mellitus     type 2 NIDDM x 5-6 yrs  . GERD (gastroesophageal reflux disease)   . Cancer     prostate  . Arthritis     back and knees  . 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 Inguinal hernia     left; asymptomatic    Current Outpatient Prescriptions  Medication Sig Dispense Refill  . ACCU-CHEK AVIVA PLUS test strip USE AND DISCARD 1 TEST     STRIP DAILY AND AS NEEDED  FOR DIABETES  100 strip  3  . acetaminophen (TYLENOL) 500 MG tablet Take 500 mg by mouth every 6 (six) hours as needed for mild pain.       Marland Kitchen albuterol (PROVENTIL HFA;VENTOLIN HFA) 108 (90 BASE) MCG/ACT inhaler Inhale 2 puffs into the lungs every 6 (six) hours as needed for wheezing.      Marland Kitchen  aspirin 81 MG tablet Take 81 mg by mouth 2 (two) times daily.      Marland Kitchen atorvastatin (LIPITOR) 20 MG tablet Take 20 mg by mouth daily.      . AVODART 0.5 MG capsule TAKE 1 CAPSULE DAILY  90 capsule  1  . carvedilol (COREG) 3.125 MG tablet Take 1 tablet (3.125 mg total) by mouth 2 (two) times daily with a meal.  180 tablet  3  . Cholecalciferol (TH VITAMIN D3) 2000 UNITS CAPS Take 2,000 Units by mouth daily.       . colchicine 0.6 MG tablet Take 1 tablet (0.6 mg total) by mouth 2 (two) times daily.  180 tablet  3  . Cyanocobalamin (VITAMIN B-12) 2500 MCG SUBL Place 2,500 mcg under the tongue daily.       Marland Kitchen dexlansoprazole (DEXILANT) 60 MG capsule Take 60 mg by mouth daily.      Marland Kitchen eplerenone (INSPRA) 25 MG tablet Take 25 mg by mouth daily.      . ferrous sulfate 325 (65 FE) MG tablet Take 325 mg by mouth 2 (two) times daily with a meal.       . furosemide (LASIX) 40 MG  tablet TAKE 1 TABLET DAILY  90 tablet  1  . gabapentin (NEURONTIN) 300 MG capsule Take 1 capsule (300 mg total) by mouth 4 (four) times daily.  360 capsule  1  . guaiFENesin (MUCINEX) 600 MG 12 hr tablet Take 1,200 mg by mouth 2 (two) times daily as needed for cough.       Marland Kitchen lisinopril (PRINIVIL,ZESTRIL) 2.5 MG tablet Take 1 tablet (2.5 mg total) by mouth daily.  90 tablet  3  . potassium chloride SA (K-DUR,KLOR-CON) 20 MEQ tablet Take 20 mEq by mouth 2 (two) times daily.      . tamsulosin (FLOMAX) 0.4 MG CAPS capsule Take 0.4 mg by mouth daily after supper.      . tiotropium (SPIRIVA) 18 MCG inhalation capsule Place 18 mcg into inhaler and inhale daily.      . traMADol (ULTRAM) 50 MG tablet Take 50 mg by mouth every 6 (six) hours as needed. For pain       No current facility-administered medications for this encounter.     PHYSICAL EXAM: Filed Vitals:   04/20/13 1212  BP: 132/70  Pulse: 71   General:  Elderly. Well appearing. No resp difficulty. Walks with cane. HEENT: normal Neck: supple. JVP flat. Carotids 2+ bilaterally; no bruits. No lymphadenopathy or thryomegaly appreciated. Cor: PMI normal. Regular rate & rhythm. No rubs, gallops or murmurs. Lungs: clear with decreased breath sounds throughout Abdomen: soft, nontender, nondistended. No hepatosplenomegaly. No bruits or masses. Good bowel sounds. Extremities: no cyanosis, clubbing, rash, edema Neuro: alert & orientedx3, cranial nerves grossly intact. Moves all 4 extremities w/o difficulty. Affect pleasant.       ASSESSMENT & PLAN: 1. Cardiomyopathy - NICM ECHO 2011 EF 55%  Volume status stable. Continue lasix 40 mg daily and INSPRA 25 mg daily . Continue current dose bb and ace. Schedule ECHO    2. A  Fib- Rate controlled Not on coumadin due to GIB. Continue aspirin daily   3. Tobacco Abuse- declines smoking cessation  Follow up 1 year .    CLEGG,AMYNP-C  12:33 PM   Patient seen and examined with Darrick Grinder, NP.  We discussed all aspects of the encounter. I agree with the assessment and plan as stated above.   Doing great. Agree with echo to reassess  LV function. Reinforced need to quit smoking or at least try to cut down using electronic cigarettes.   Jaidon Ellery,MD 12:24 AM

## 2013-04-28 ENCOUNTER — Encounter: Payer: Self-pay | Admitting: Internal Medicine

## 2013-05-04 ENCOUNTER — Encounter: Payer: Medicare Other | Admitting: *Deleted

## 2013-05-10 ENCOUNTER — Telehealth: Payer: Self-pay | Admitting: Family Medicine

## 2013-05-10 ENCOUNTER — Other Ambulatory Visit: Payer: Self-pay | Admitting: Family Medicine

## 2013-05-10 NOTE — Telephone Encounter (Signed)
Relevant patient education assigned to patient using Emmi. ° °

## 2013-05-12 ENCOUNTER — Other Ambulatory Visit (HOSPITAL_COMMUNITY): Payer: Self-pay | Admitting: Cardiology

## 2013-05-12 DIAGNOSIS — I5032 Chronic diastolic (congestive) heart failure: Secondary | ICD-10-CM

## 2013-05-15 ENCOUNTER — Ambulatory Visit: Payer: Medicare Other

## 2013-05-15 DIAGNOSIS — I4891 Unspecified atrial fibrillation: Secondary | ICD-10-CM | POA: Diagnosis not present

## 2013-05-15 DIAGNOSIS — I4729 Other ventricular tachycardia: Secondary | ICD-10-CM

## 2013-05-15 DIAGNOSIS — I472 Ventricular tachycardia: Secondary | ICD-10-CM

## 2013-05-15 DIAGNOSIS — I5032 Chronic diastolic (congestive) heart failure: Secondary | ICD-10-CM | POA: Diagnosis not present

## 2013-05-15 LAB — MDC_IDC_ENUM_SESS_TYPE_REMOTE
Battery Remaining Longevity: 87 mo
Battery Voltage: 3.02 V
Brady Statistic AP VP Percent: 0 %
Brady Statistic AS VP Percent: 98.79 %
Brady Statistic RA Percent Paced: 0 %
Date Time Interrogation Session: 20150131050712
HighPow Impedance: 209 Ohm
HighPow Impedance: 43 Ohm
HighPow Impedance: 53 Ohm
Lead Channel Impedance Value: 494 Ohm
Lead Channel Pacing Threshold Amplitude: 0.75 V
Lead Channel Pacing Threshold Amplitude: 2.375 V
Lead Channel Pacing Threshold Pulse Width: 0.4 ms
Lead Channel Setting Pacing Amplitude: 2 V
Lead Channel Setting Pacing Amplitude: 2.5 V
Lead Channel Setting Pacing Pulse Width: 0.4 ms
Lead Channel Setting Sensing Sensitivity: 0.3 mV
MDC IDC MSMT LEADCHNL LV IMPEDANCE VALUE: 4047 Ohm
MDC IDC MSMT LEADCHNL LV IMPEDANCE VALUE: 4047 Ohm
MDC IDC MSMT LEADCHNL LV IMPEDANCE VALUE: 494 Ohm
MDC IDC MSMT LEADCHNL LV PACING THRESHOLD PULSEWIDTH: 0.8 ms
MDC IDC MSMT LEADCHNL RA SENSING INTR AMPL: 0.375 mV
MDC IDC MSMT LEADCHNL RV IMPEDANCE VALUE: 627 Ohm
MDC IDC SET LEADCHNL LV PACING PULSEWIDTH: 0.8 ms
MDC IDC SET ZONE DETECTION INTERVAL: 300 ms
MDC IDC SET ZONE DETECTION INTERVAL: 350 ms
MDC IDC SET ZONE DETECTION INTERVAL: 350 ms
MDC IDC STAT BRADY AP VS PERCENT: 0 %
MDC IDC STAT BRADY AS VS PERCENT: 1.21 %
MDC IDC STAT BRADY RV PERCENT PACED: 99.12 %
Zone Setting Detection Interval: 450 ms

## 2013-05-17 ENCOUNTER — Ambulatory Visit (HOSPITAL_COMMUNITY)
Admission: RE | Admit: 2013-05-17 | Discharge: 2013-05-17 | Disposition: A | Discharge status: Home / Self Care | Payer: Medicare Other | Source: Ambulatory Visit | Attending: Family Medicine | Admitting: Family Medicine

## 2013-05-17 DIAGNOSIS — I517 Cardiomegaly: Secondary | ICD-10-CM

## 2013-05-17 DIAGNOSIS — R0989 Other specified symptoms and signs involving the circulatory and respiratory systems: Secondary | ICD-10-CM | POA: Insufficient documentation

## 2013-05-17 DIAGNOSIS — I10 Essential (primary) hypertension: Secondary | ICD-10-CM | POA: Insufficient documentation

## 2013-05-17 DIAGNOSIS — I059 Rheumatic mitral valve disease, unspecified: Secondary | ICD-10-CM | POA: Insufficient documentation

## 2013-05-17 DIAGNOSIS — I509 Heart failure, unspecified: Secondary | ICD-10-CM | POA: Insufficient documentation

## 2013-05-17 DIAGNOSIS — I4891 Unspecified atrial fibrillation: Secondary | ICD-10-CM | POA: Diagnosis not present

## 2013-05-17 DIAGNOSIS — R0609 Other forms of dyspnea: Secondary | ICD-10-CM | POA: Diagnosis not present

## 2013-05-17 DIAGNOSIS — I5032 Chronic diastolic (congestive) heart failure: Secondary | ICD-10-CM

## 2013-05-17 DIAGNOSIS — Z950 Presence of cardiac pacemaker: Secondary | ICD-10-CM | POA: Insufficient documentation

## 2013-05-17 DIAGNOSIS — E119 Type 2 diabetes mellitus without complications: Secondary | ICD-10-CM | POA: Insufficient documentation

## 2013-05-17 DIAGNOSIS — J449 Chronic obstructive pulmonary disease, unspecified: Secondary | ICD-10-CM | POA: Diagnosis not present

## 2013-05-17 DIAGNOSIS — E785 Hyperlipidemia, unspecified: Secondary | ICD-10-CM | POA: Diagnosis not present

## 2013-05-17 DIAGNOSIS — J4489 Other specified chronic obstructive pulmonary disease: Secondary | ICD-10-CM | POA: Insufficient documentation

## 2013-05-17 NOTE — Progress Notes (Signed)
Echo Lab  2D Echocardiogram completed.  Carlton, RDCS 05/17/2013 11:57 AM

## 2013-05-19 ENCOUNTER — Encounter: Payer: Self-pay | Admitting: Internal Medicine

## 2013-05-29 ENCOUNTER — Ambulatory Visit (INDEPENDENT_AMBULATORY_CARE_PROVIDER_SITE_OTHER): Payer: Medicare Other | Admitting: *Deleted

## 2013-05-29 DIAGNOSIS — I4891 Unspecified atrial fibrillation: Secondary | ICD-10-CM

## 2013-05-29 DIAGNOSIS — I4729 Other ventricular tachycardia: Secondary | ICD-10-CM

## 2013-05-29 DIAGNOSIS — I472 Ventricular tachycardia: Secondary | ICD-10-CM

## 2013-05-29 DIAGNOSIS — I5032 Chronic diastolic (congestive) heart failure: Secondary | ICD-10-CM

## 2013-06-07 ENCOUNTER — Other Ambulatory Visit: Payer: Self-pay | Admitting: *Deleted

## 2013-06-07 NOTE — Telephone Encounter (Signed)
Received fax from [facility] for Diabetic Testing Supplies.  I called [name] to verify that he requested these supplies.  [name] does not wish to get diabetic testing supplies from the company at this time.  Form faxed back to [employer] denying testing supplies.

## 2013-06-16 ENCOUNTER — Telehealth: Payer: Self-pay

## 2013-06-16 ENCOUNTER — Telehealth: Payer: Self-pay | Admitting: Family Medicine

## 2013-06-16 MED ORDER — GLUCOSE BLOOD VI STRP: ORAL_STRIP | Status: DC

## 2013-06-16 NOTE — Telephone Encounter (Signed)
If no GERD or stomach pain off dexilant.. can stay off. If has symptoms restart, if only mild symptoms we could have him use 20 mg prilosec OTC daily.

## 2013-06-16 NOTE — Telephone Encounter (Signed)
[name] pts daughter said pt has been out of dexilant for awhile and [name] is not sure if pt should be taking dexilant or another med for stomach issues.12/01/12 visit with cardiologist looks like dexilant was stopped.Please advise. Pt was instructed to stop diabetic meds but toni said pt was to continue checking BS. [facility]. [name] request cb.

## 2013-06-16 NOTE — Telephone Encounter (Signed)
error 

## 2013-06-19 ENCOUNTER — Telehealth (HOSPITAL_COMMUNITY): Payer: Self-pay | Admitting: *Deleted

## 2013-06-19 NOTE — Telephone Encounter (Signed)
[name] (daughter) and [name] notified as instructed by telephone.  He states he is not having any acid reflux issues at this time.

## 2013-06-19 NOTE — Telephone Encounter (Signed)
Late entry:  Pt's family called and was given echo results week after echo was done

## 2013-06-20 NOTE — Telephone Encounter (Signed)
Left message for [name] to return my call.

## 2013-06-20 NOTE — Telephone Encounter (Signed)
[name] pts daughter left v/m requesting cb about GERD med; [name] said pt having severe indigestion; [name] said pt does not hear well on phone and [name] request cb [phone].

## 2013-06-21 MED ORDER — DEXLANSOPRAZOLE 60 MG PO CPDR: 60.0000 mg | DELAYED_RELEASE_CAPSULE | Freq: Every day | ORAL | Status: DC

## 2013-06-21 NOTE — Addendum Note (Signed)
Addended by: Carter Kitten on: 06/21/2013 11:45 AM   Modules accepted: Orders

## 2013-06-21 NOTE — Telephone Encounter (Signed)
[name] called back to say [name] has been having severe indegestion and it has kept him up the last two nights.  Would like to restart Dexilant and request refill be sent to [facility].

## 2013-06-21 NOTE — Telephone Encounter (Signed)
[name] notified prescription for Dexilant 60 mg take one daily, has been sent in the [place].

## 2013-06-30 ENCOUNTER — Telehealth: Payer: Self-pay | Admitting: Family Medicine

## 2013-06-30 ENCOUNTER — Encounter: Payer: Self-pay | Admitting: Internal Medicine

## 2013-06-30 ENCOUNTER — Ambulatory Visit (INDEPENDENT_AMBULATORY_CARE_PROVIDER_SITE_OTHER): Payer: Medicare Other | Admitting: Internal Medicine

## 2013-06-30 VITALS — BP 98/70 | Temp 97.5°F | Wt 186.0 lb

## 2013-06-30 DIAGNOSIS — J441 Chronic obstructive pulmonary disease with (acute) exacerbation: Secondary | ICD-10-CM | POA: Diagnosis not present

## 2013-06-30 MED ORDER — PREDNISONE 10 MG PO TABS: ORAL_TABLET | ORAL | Status: DC

## 2013-06-30 MED ORDER — AZITHROMYCIN 250 MG PO TABS: ORAL_TABLET | ORAL | Status: DC

## 2013-06-30 NOTE — Patient Instructions (Addendum)
Chronic Obstructive Pulmonary Disease  Chronic obstructive pulmonary disease (COPD) is a common lung condition in which airflow from the lungs is limited. COPD is a general term that can be used to describe many different lung problems that limit airflow, including both chronic bronchitis and emphysema.  If you have COPD, your lung function will probably never return to normal, but there are measures you can take to improve lung function and make yourself feel better.   CAUSES   · Smoking (common).    · Exposure to secondhand smoke.    · Genetic problems.  · Chronic inflammatory lung diseases or recurrent infections.  SYMPTOMS   · Shortness of breath, especially with physical activity.    · Deep, persistent (chronic) cough with a large amount of thick mucus.    · Wheezing.    · Rapid breaths (tachypnea).    · Gray or bluish discoloration (cyanosis) of the skin, especially in fingers, toes, or lips.    · Fatigue.    · Weight loss.    · Frequent infections or episodes when breathing symptoms become much worse (exacerbations).    · Chest tightness.  DIAGNOSIS   Your healthcare provider will take a medical history and perform a physical examination to make the initial diagnosis.  Additional tests for COPD may include:   · Lung (pulmonary) function tests.  · Chest X-ray.  · CT scan.  · Blood tests.  TREATMENT   Treatment available to help you feel better when you have COPD include:   · Inhaler and nebulizer medicines. These help manage the symptoms of COPD and make your breathing more comfortable  · Supplemental oxygen. Supplemental oxygen is only helpful if you have a low oxygen level in your blood.    · Exercise and physical activity. These are beneficial for nearly all people with COPD. Some people may also benefit from a pulmonary rehabilitation program.  HOME CARE INSTRUCTIONS   · Take all medicines (inhaled or pills) as directed by your health care provider.  · Only take over-the-counter or prescription medicines  for pain, fever, or discomfort as directed by your health care provider.    · Avoid over-the-counter medicines or cough syrups that dry up your airway (such as antihistamines) and slow down the elimination of secretions unless instructed otherwise by your healthcare provider.    · If you are a smoker, the most important thing that you can do is stop smoking. Continuing to smoke will cause further lung damage and breathing trouble. Ask your health care provider for help with quitting smoking. He or she can direct you to community resources or hospitals that provide support.  · Avoid exposure to irritants such as smoke, chemicals, and fumes that aggravate your breathing.  · Use oxygen therapy and pulmonary rehabilitation if directed by your health care provider. If you require home oxygen therapy, ask your healthcare provider whether you should purchase a pulse oximeter to measure your oxygen level at home.    · Avoid contact with individuals who have a contagious illness.  · Avoid extreme temperature and humidity changes.  · Eat healthy foods. Eating smaller, more frequent meals and resting before meals may help you maintain your strength.  · Stay active, but balance activity with periods of rest. Exercise and physical activity will help you maintain your ability to do things you want to do.  · Preventing infection and hospitalization is very important when you have COPD. Make sure to receive all the vaccines your health care provider recommends, especially the pneumococcal and influenza vaccines. Ask your healthcare provider whether you   need a pneumonia vaccine.  · Learn and use relaxation techniques to manage stress.  · Learn and use controlled breathing techniques as directed by your health care provider. Controlled breathing techniques include:    · Pursed lip breathing. Start by breathing in (inhaling) through your nose for 1 second. Then, purse your lips as if you were going to whistle and breathe out (exhale)  through the pursed lips for 2 seconds.    · Diaphragmatic breathing. Start by putting one hand on your abdomen just above your waist. Inhale slowly through your nose. The hand on your abdomen should move out. Then purse your lips and exhale slowly. You should be able to feel the hand on your abdomen moving in as you exhale.    · Learn and use controlled coughing to clear mucus from your lungs. Controlled coughing is a series of short, progressive coughs. The steps of controlled coughing are:    1. Lean your head slightly forward.    2. Breathe in deeply using diaphragmatic breathing.    3. Try to hold your breath for 3 seconds.    4. Keep your mouth slightly open while coughing twice.    5. Spit any mucus out into a tissue.    6. Rest and repeat the steps once or twice as needed.  SEEK MEDICAL CARE IF:   · You are coughing up more mucus than usual.    · There is a change in the color or thickness of your mucus.    · Your breathing is more labored than usual.    · Your breathing is faster than usual.    SEEK IMMEDIATE MEDICAL CARE IF:   · You have shortness of breath while you are resting.    · You have shortness of breath that prevents you from:  · Being able to talk.    · Performing your usual physical activities.    · You have chest pain lasting longer than 5 minutes.    · Your skin color is more cyanotic than usual.  · You measure low oxygen saturations for longer than 5 minutes with a pulse oximeter.  MAKE SURE YOU:   · Understand these instructions.  · Will watch your condition.  · Will get help right away if you are not doing well or get worse.  Document Released: 01/07/2005 Document Revised: 01/18/2013 Document Reviewed: 11/24/2012  Exit[employer]® Patient Information ©2014 [employer].

## 2013-06-30 NOTE — Progress Notes (Signed)
HPI  Pt presents to the clinic today with c/o cough and chest congestion. He reports this started 1 week ago. He does feel a tightness in his chest. The cough is productive of thick white mucous. He denies fever, chills and body aches. He has taken Mucinex OTC which has helped. He does have a history of COPD and CHF. He is currently smoking. He did get the flu shot this year. He has had sick contacts.  Review of Systems      Past Medical History  Diagnosis Date  . COPD (chronic obstructive pulmonary disease)     GOLD II; Spirometry 07/10/2008 >FEV1 1.46 56% predicted, ratio of 66%  . Anemia     iron deficiency anemia with previous severe GI bleed   . Hypertension   . Hyperlipidemia   . Left bundle branch block   . Prostatic hypertrophy 09-11-97    Benign  . Obesity   . CHF (congestive heart failure)     secondary to nonischemic cardiomyopathy; ECHO 10/07 EF 20-25%, mod to severe MR; ECHO 1/10 EF 55-60%, mild MR; ECHO 2/11 55-65%, grade 1 diast dysfxn, miod to mod LAE, mild TR;    S/P Medtronic BiV ICD with biventricular function now turned off due to diahragmatic simulation  . CAD (coronary artery disease)     Mild, nonobstructive (LHC 1/07: mLAD 20%, pCFX 20-30%, mRCA 30%, EF 25%)  . Atrial fibrillation     s/p AV node ablation; not on coumadin due to GIB  . Cardiac arrest - ventricular fibrillation 02/2008    Aborted, shocked by ICD  . Dyspnea     exertional  . Pneumonia     01/2011  . Diabetes mellitus     type 2 NIDDM x 5-6 yrs  . GERD (gastroesophageal reflux disease)   . Cancer     prostate  . Arthritis     back and knees  . 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 Inguinal hernia     left; asymptomatic    Family History  Problem Relation Age of Onset  . Heart failure Mother     CHF  . Heart failure Father     CHF  . Hypertension Sister   . Obesity Sister   . Cancer Brother     lung cancer  . Lung cancer Brother   . Liver disease Brother    ETOH  . Alcohol abuse Brother   . Cancer Brother   . COPD Brother   . Cancer Brother   . Anxiety disorder Brother   . COPD Brother   . Hypertension Sister     History   Social History  . Marital Status: Married    Spouse Name: N/A    Number of Children: N/A  . Years of Education: N/A   Occupational History  . [employer]    Retired  .     Social History Main Topics  . Smoking status: Current Every Day Smoker -- 1.00 packs/day for 60 years    Types: Cigarettes  . Smokeless tobacco: Never Used     Comment: Smoker since 14, quit for 2 years and started back in 03/2008  . Alcohol Use: No  . Drug Use: No  . Sexual Activity: Not on file   Other Topics Concern  . Not on file   Social History Narrative   Has living will: Full Code.   HCPOA: wife and daughter  Married, lives with wife and 1 adopted child    No Known Allergies   Constitutional:  Denies headache, fatigue, fever or abrupt weight changes.  HEENT:   Denies eye redness, eye pain, pressure behind the eyes, facial pain, nasal congestion, ear pain, ringing in the ears, wax buildup, runny nose or bloody nose. Respiratory: Positive cough. Denies difficulty breathing or shortness of breath.  Cardiovascular: Denies chest pain, chest tightness, palpitations or swelling in the hands or feet.   No other specific complaints in a complete review of systems (except as listed in HPI above).  Objective:   BP 98/70  Temp(Src) 97.5 F (36.4 C) (Oral)  Wt 186 lb (84.369 kg)    General: Appears his stated age, well developed, well nourished in NAD. HEENT: Head: normal shape and size; Eyes: sclera white, no icterus, conjunctiva pink, PERRLA and EOMs intact; Ears: Tm's gray and intact, normal light reflex; Nose: mucosa pink and moist, septum midline; Throat/Mouth: + PND. Teeth present, mucosa erythematous and moist, no exudate noted, no lesions or ulcerations noted.  Neck:. Neck supple, trachea midline. No  massses, lumps or thyromegaly present.  Cardiovascular: Normal rate and rhythm. S1,S2 noted.  No murmur, rubs or gallops noted. No JVD or BLE edema. No carotid bruits noted. Pulmonary/Chest: Normal effort and expiratory wheezing noted in the RLL. No respiratory distress. No wheezes, rales or ronchi noted.      Assessment & Plan:   COPD exacerbation:  Get some rest and drink plenty of water Do salt water gargles for the sore throat eRx for Azithromax x 5 days eRx for pred taper  RTC as needed or if symptoms persist.

## 2013-06-30 NOTE — Telephone Encounter (Signed)
Pt's daughter dropped pff handi-cap DMV form to be signed. Placed in Dr. Sharmaine Base inbox on her desk.

## 2013-06-30 NOTE — Progress Notes (Signed)
Pre visit review using our clinic review tool, if applicable. No additional management support is needed unless otherwise documented below in the visit note. 

## 2013-07-03 ENCOUNTER — Encounter: Payer: Self-pay | Admitting: *Deleted

## 2013-07-03 ENCOUNTER — Telehealth: Payer: Self-pay | Admitting: Family Medicine

## 2013-07-03 NOTE — Telephone Encounter (Signed)
Relevant patient education assigned to patient using Emmi. ° °

## 2013-07-04 ENCOUNTER — Other Ambulatory Visit: Payer: Self-pay | Admitting: Family Medicine

## 2013-07-04 NOTE — Telephone Encounter (Signed)
Last office visit 06/30/2013 with Webb Silversmith.  Ok to refill?

## 2013-07-11 ENCOUNTER — Encounter: Payer: Self-pay | Admitting: Internal Medicine

## 2013-08-01 DIAGNOSIS — H353190 Nonexudative age-related macular degeneration, unspecified eye, stage unspecified: Secondary | ICD-10-CM | POA: Diagnosis not present

## 2013-08-01 LAB — HM DIABETES EYE EXAM

## 2013-08-16 ENCOUNTER — Other Ambulatory Visit: Payer: Self-pay | Admitting: Internal Medicine

## 2013-08-24 ENCOUNTER — Telehealth: Payer: Self-pay

## 2013-08-24 NOTE — Telephone Encounter (Signed)
[name] pts daughter said a lot of things are going on now and needs a letter that pt is not capable of handling business affairs. [name] said she is POA  but needs letter to take care of legal matters. [name] will bring POA to office and more detailed note will go to Dr Diona Browner. Will sent to Dr Cecilie Lowers.

## 2013-08-25 NOTE — Telephone Encounter (Signed)
Let daughter know that  [name] does not have diagnosis of dementia and no recent MMSE has been done. He needs to have an appt to assess him for dementia before any such note would be written.

## 2013-08-25 NOTE — Telephone Encounter (Signed)
Please disregard original phone note. After speaking with [name], [name], does not have dementia. The letter was only for pt wife, [name]. Sorry for the confusion. [name] dropped off POA. Will put in Dr. Rometta Emery inbox to review.

## 2013-08-30 ENCOUNTER — Encounter: Payer: Self-pay | Admitting: Internal Medicine

## 2013-08-30 ENCOUNTER — Ambulatory Visit (INDEPENDENT_AMBULATORY_CARE_PROVIDER_SITE_OTHER): Payer: Medicare Other | Admitting: *Deleted

## 2013-08-30 DIAGNOSIS — I4891 Unspecified atrial fibrillation: Secondary | ICD-10-CM | POA: Diagnosis not present

## 2013-08-30 DIAGNOSIS — I4901 Ventricular fibrillation: Secondary | ICD-10-CM | POA: Diagnosis not present

## 2013-08-31 ENCOUNTER — Telehealth: Payer: Self-pay

## 2013-08-31 NOTE — Telephone Encounter (Signed)
[name] pts daughter came by office; [name] is concerned that pt is not taking his med as he should be taking; [name] is going to take more active roll in her parents care and request cb with review of pts meds and when pt should be seen for next f/u appt. [name] request cb.

## 2013-08-31 NOTE — Progress Notes (Signed)
Remote ICD transmission.   

## 2013-08-31 NOTE — Telephone Encounter (Signed)
Please call to review meds. Okay to refill meds for 1 year. Due for medicare wellness now with fasting labs prior

## 2013-08-31 NOTE — Telephone Encounter (Signed)
Called and discussed medications with [name].  Advised Mr. Trentman is due to a Medicare Wellness with fasting labs prior.  She will call to back schedule that appointment for sometime in July.  Will wait to refill medications at that appointment.  Mr. Corvino is not currently in need of refills.

## 2013-09-03 ENCOUNTER — Other Ambulatory Visit: Payer: Self-pay | Admitting: Family Medicine

## 2013-09-03 ENCOUNTER — Other Ambulatory Visit: Payer: Self-pay | Admitting: Internal Medicine

## 2013-09-10 LAB — MDC_IDC_ENUM_SESS_TYPE_REMOTE
Battery Remaining Longevity: 84 mo
Battery Voltage: 2.99 V
Brady Statistic AP VS Percent: 0 %
Brady Statistic AS VS Percent: 3.83 %
Brady Statistic RA Percent Paced: 0 %
Date Time Interrogation Session: 20150521025728
HIGH POWER IMPEDANCE MEASURED VALUE: 44 Ohm
HIGH POWER IMPEDANCE MEASURED VALUE: 53 Ohm
HighPow Impedance: 209 Ohm
Lead Channel Impedance Value: 4047 Ohm
Lead Channel Pacing Threshold Amplitude: 0.75 V
Lead Channel Pacing Threshold Pulse Width: 0.4 ms
Lead Channel Pacing Threshold Pulse Width: 0.8 ms
Lead Channel Sensing Intrinsic Amplitude: 0.5 mV
Lead Channel Sensing Intrinsic Amplitude: 9.625 mV
Lead Channel Sensing Intrinsic Amplitude: 9.625 mV
Lead Channel Setting Pacing Amplitude: 2 V
Lead Channel Setting Pacing Amplitude: 2.5 V
Lead Channel Setting Pacing Pulse Width: 0.4 ms
Lead Channel Setting Pacing Pulse Width: 0.8 ms
Lead Channel Setting Sensing Sensitivity: 0.3 mV
MDC IDC MSMT LEADCHNL LV IMPEDANCE VALUE: 4047 Ohm
MDC IDC MSMT LEADCHNL LV IMPEDANCE VALUE: 532 Ohm
MDC IDC MSMT LEADCHNL LV PACING THRESHOLD AMPLITUDE: 2.125 V
MDC IDC MSMT LEADCHNL RA IMPEDANCE VALUE: 494 Ohm
MDC IDC MSMT LEADCHNL RA SENSING INTR AMPL: 0.5 mV
MDC IDC MSMT LEADCHNL RV IMPEDANCE VALUE: 532 Ohm
MDC IDC SET ZONE DETECTION INTERVAL: 350 ms
MDC IDC STAT BRADY AP VP PERCENT: 0 %
MDC IDC STAT BRADY AS VP PERCENT: 96.17 %
MDC IDC STAT BRADY RV PERCENT PACED: 97.25 %
Zone Setting Detection Interval: 300 ms
Zone Setting Detection Interval: 350 ms
Zone Setting Detection Interval: 450 ms

## 2013-09-12 ENCOUNTER — Telehealth: Payer: Self-pay

## 2013-09-12 NOTE — Telephone Encounter (Signed)
[name] came by office; received letter from [facility] about Eplerenone refill; Dr Hayden Pedro is on med list as refilling; [name] to ck with pharmacy.

## 2013-09-13 ENCOUNTER — Telehealth: Payer: Self-pay | Admitting: Family Medicine

## 2013-09-13 MED ORDER — ALBUTEROL SULFATE HFA 108 (90 BASE) MCG/ACT IN AERS: 2.0000 | INHALATION_SPRAY | Freq: Four times a day (QID) | RESPIRATORY_TRACT | Status: DC | PRN

## 2013-09-13 NOTE — Telephone Encounter (Signed)
[name] notified prescription for ProAir has been sent to [facility].

## 2013-09-13 NOTE — Telephone Encounter (Signed)
Pt daughter came in today to get a refill for  proair HFA 108  90 base mcc1act inhaler Please send refill into  [facility]   90 day supply for 1year  Please let [name] know when this has been called in

## 2013-09-15 ENCOUNTER — Encounter: Payer: Self-pay | Admitting: Cardiology

## 2013-10-10 ENCOUNTER — Ambulatory Visit: Payer: Medicare Other | Admitting: Family Medicine

## 2013-10-11 ENCOUNTER — Ambulatory Visit: Payer: Medicare Other | Admitting: Family Medicine

## 2013-11-07 ENCOUNTER — Other Ambulatory Visit: Payer: Self-pay | Admitting: Family Medicine

## 2013-11-14 ENCOUNTER — Other Ambulatory Visit: Payer: Self-pay | Admitting: Dermatology

## 2013-11-14 DIAGNOSIS — D044 Carcinoma in situ of skin of scalp and neck: Secondary | ICD-10-CM | POA: Diagnosis not present

## 2013-11-14 DIAGNOSIS — L570 Actinic keratosis: Secondary | ICD-10-CM | POA: Diagnosis not present

## 2013-11-16 DIAGNOSIS — C61 Malignant neoplasm of prostate: Secondary | ICD-10-CM | POA: Diagnosis not present

## 2013-11-28 DIAGNOSIS — Z6829 Body mass index (BMI) 29.0-29.9, adult: Secondary | ICD-10-CM | POA: Diagnosis not present

## 2013-11-28 DIAGNOSIS — M549 Dorsalgia, unspecified: Secondary | ICD-10-CM | POA: Diagnosis not present

## 2013-12-04 ENCOUNTER — Ambulatory Visit (INDEPENDENT_AMBULATORY_CARE_PROVIDER_SITE_OTHER): Payer: Medicare Other | Admitting: *Deleted

## 2013-12-04 ENCOUNTER — Telehealth: Payer: Self-pay | Admitting: Cardiology

## 2013-12-04 DIAGNOSIS — I4901 Ventricular fibrillation: Secondary | ICD-10-CM | POA: Diagnosis not present

## 2013-12-04 DIAGNOSIS — I5022 Chronic systolic (congestive) heart failure: Secondary | ICD-10-CM

## 2013-12-04 LAB — MDC_IDC_ENUM_SESS_TYPE_REMOTE
Brady Statistic AP VP Percent: 0 %
Brady Statistic AP VS Percent: 0 %
Brady Statistic AS VP Percent: 99.36 %
Brady Statistic RA Percent Paced: 0 %
Brady Statistic RV Percent Paced: 99.49 %
Date Time Interrogation Session: 20150824222728
HighPow Impedance: 247 Ohm
HighPow Impedance: 44 Ohm
HighPow Impedance: 52 Ohm
Lead Channel Impedance Value: 4047 Ohm
Lead Channel Impedance Value: 4047 Ohm
Lead Channel Impedance Value: 494 Ohm
Lead Channel Impedance Value: 494 Ohm
Lead Channel Pacing Threshold Pulse Width: 0.8 ms
Lead Channel Sensing Intrinsic Amplitude: 0.5 mV
Lead Channel Sensing Intrinsic Amplitude: 10.125 mV
Lead Channel Setting Pacing Amplitude: 2.5 V
Lead Channel Setting Pacing Pulse Width: 0.8 ms
MDC IDC MSMT BATTERY REMAINING LONGEVITY: 79 mo
MDC IDC MSMT BATTERY VOLTAGE: 2.98 V
MDC IDC MSMT LEADCHNL LV PACING THRESHOLD AMPLITUDE: 2.125 V
MDC IDC MSMT LEADCHNL RA SENSING INTR AMPL: 0.5 mV
MDC IDC MSMT LEADCHNL RV IMPEDANCE VALUE: 532 Ohm
MDC IDC MSMT LEADCHNL RV PACING THRESHOLD AMPLITUDE: 0.625 V
MDC IDC MSMT LEADCHNL RV PACING THRESHOLD PULSEWIDTH: 0.4 ms
MDC IDC MSMT LEADCHNL RV SENSING INTR AMPL: 10.125 mV
MDC IDC SET LEADCHNL RV PACING AMPLITUDE: 2 V
MDC IDC SET LEADCHNL RV PACING PULSEWIDTH: 0.4 ms
MDC IDC SET LEADCHNL RV SENSING SENSITIVITY: 0.3 mV
MDC IDC SET ZONE DETECTION INTERVAL: 350 ms
MDC IDC SET ZONE DETECTION INTERVAL: 350 ms
MDC IDC SET ZONE DETECTION INTERVAL: 450 ms
MDC IDC STAT BRADY AS VS PERCENT: 0.64 %
Zone Setting Detection Interval: 300 ms

## 2013-12-04 NOTE — Telephone Encounter (Signed)
LMOVM reminding pt to send remote transmission.   

## 2013-12-05 NOTE — Progress Notes (Signed)
Remote ICD transmission.   

## 2013-12-08 ENCOUNTER — Encounter: Payer: Medicare Other | Admitting: Family Medicine

## 2013-12-08 ENCOUNTER — Other Ambulatory Visit: Payer: Self-pay | Admitting: Internal Medicine

## 2013-12-11 ENCOUNTER — Other Ambulatory Visit: Payer: Self-pay | Admitting: Family Medicine

## 2013-12-11 NOTE — Telephone Encounter (Signed)
Last office visit 06/30/2013 with Webb Silversmith for COPD.  Ok to refill?  Last refilled 07/04/2013 for #360 with 1 refill.  Ok to refill?

## 2013-12-13 ENCOUNTER — Encounter: Payer: Self-pay | Admitting: Internal Medicine

## 2013-12-21 ENCOUNTER — Encounter: Payer: Self-pay | Admitting: Cardiology

## 2013-12-22 ENCOUNTER — Ambulatory Visit (INDEPENDENT_AMBULATORY_CARE_PROVIDER_SITE_OTHER): Payer: Medicare Other | Admitting: Family Medicine

## 2013-12-22 ENCOUNTER — Encounter: Payer: Self-pay | Admitting: Family Medicine

## 2013-12-22 VITALS — BP 114/72 | HR 69 | Temp 98.4°F | Wt 182.0 lb

## 2013-12-22 DIAGNOSIS — E119 Type 2 diabetes mellitus without complications: Secondary | ICD-10-CM

## 2013-12-22 DIAGNOSIS — D509 Iron deficiency anemia, unspecified: Secondary | ICD-10-CM | POA: Diagnosis not present

## 2013-12-22 DIAGNOSIS — F03B0 Unspecified dementia, moderate, without behavioral disturbance, psychotic disturbance, mood disturbance, and anxiety: Secondary | ICD-10-CM | POA: Insufficient documentation

## 2013-12-22 DIAGNOSIS — F03A0 Unspecified dementia, mild, without behavioral disturbance, psychotic disturbance, mood disturbance, and anxiety: Secondary | ICD-10-CM

## 2013-12-22 DIAGNOSIS — E538 Deficiency of other specified B group vitamins: Secondary | ICD-10-CM | POA: Diagnosis not present

## 2013-12-22 DIAGNOSIS — Z0000 Encounter for general adult medical examination without abnormal findings: Secondary | ICD-10-CM

## 2013-12-22 DIAGNOSIS — M109 Gout, unspecified: Secondary | ICD-10-CM | POA: Diagnosis not present

## 2013-12-22 DIAGNOSIS — Z23 Encounter for immunization: Secondary | ICD-10-CM

## 2013-12-22 DIAGNOSIS — I10 Essential (primary) hypertension: Secondary | ICD-10-CM

## 2013-12-22 DIAGNOSIS — F0390 Unspecified dementia without behavioral disturbance: Secondary | ICD-10-CM

## 2013-12-22 LAB — LIPID PANEL
CHOL/HDL RATIO: 4
Cholesterol: 94 mg/dL (ref 0–200)
HDL: 21.3 mg/dL — ABNORMAL LOW (ref >39.00)
LDL Cholesterol: 40 mg/dL (ref 0–99)
NONHDL: 72.7
Triglycerides: 165 mg/dL — ABNORMAL HIGH (ref 0.0–149.0)
VLDL: 33 mg/dL (ref 0.0–40.0)

## 2013-12-22 LAB — IBC PANEL
IRON: 67 ug/dL (ref 42–165)
Saturation Ratios: 17.9 % — ABNORMAL LOW (ref 20.0–50.0)
TRANSFERRIN: 266.9 mg/dL (ref 212.0–360.0)

## 2013-12-22 LAB — FERRITIN: Ferritin: 84.9 ng/mL (ref 22.0–322.0)

## 2013-12-22 LAB — COMPREHENSIVE METABOLIC PANEL
ALT: 35 U/L (ref 0–53)
AST: 31 U/L (ref 0–37)
Albumin: 4 g/dL (ref 3.5–5.2)
Alkaline Phosphatase: 105 U/L (ref 39–117)
BUN: 15 mg/dL (ref 6–23)
CALCIUM: 9.3 mg/dL (ref 8.4–10.5)
CHLORIDE: 103 meq/L (ref 96–112)
CO2: 29 mEq/L (ref 19–32)
CREATININE: 1.1 mg/dL (ref 0.4–1.5)
GFR: 70.48 mL/min (ref >60.00)
GLUCOSE: 93 mg/dL (ref 70–99)
Potassium: 4.4 mEq/L (ref 3.5–5.1)
Sodium: 139 mEq/L (ref 135–145)
Total Bilirubin: 0.4 mg/dL (ref 0.2–1.2)
Total Protein: 6.7 g/dL (ref 6.0–8.3)

## 2013-12-22 LAB — CBC WITH DIFFERENTIAL/PLATELET
BASOS ABS: 0.1 10*3/uL (ref 0.0–0.1)
BASOS PCT: 0.9 % (ref 0.0–3.0)
Eosinophils Absolute: 0.3 10*3/uL (ref 0.0–0.7)
Eosinophils Relative: 3.7 % (ref 0.0–5.0)
HCT: 39.7 % (ref 39.0–52.0)
HEMOGLOBIN: 13.1 g/dL (ref 13.0–17.0)
LYMPHS ABS: 2.2 10*3/uL (ref 0.7–4.0)
LYMPHS PCT: 26.4 % (ref 12.0–46.0)
MCHC: 33 g/dL (ref 30.0–36.0)
MCV: 92.2 fl (ref 78.0–100.0)
Monocytes Absolute: 0.9 10*3/uL (ref 0.1–1.0)
Monocytes Relative: 10.6 % (ref 3.0–12.0)
Neutro Abs: 4.9 10*3/uL (ref 1.4–7.7)
Neutrophils Relative %: 58.4 % (ref 43.0–77.0)
Platelets: 156 10*3/uL (ref 150.0–400.0)
RBC: 4.3 Mil/uL (ref 4.22–5.81)
RDW: 15.5 % (ref 11.5–15.5)
WBC: 8.4 10*3/uL (ref 4.0–10.5)

## 2013-12-22 LAB — VITAMIN B12: Vitamin B-12: >1500 pg/mL — ABNORMAL HIGH (ref 211–911)

## 2013-12-22 LAB — URIC ACID: Uric Acid, Serum: 8.2 mg/dL — ABNORMAL HIGH (ref 4.0–7.8)

## 2013-12-22 LAB — HEMOGLOBIN A1C: HEMOGLOBIN A1C: 6.6 % — AB (ref 4.6–6.5)

## 2013-12-22 LAB — HM DIABETES FOOT EXAM

## 2013-12-22 NOTE — Progress Notes (Signed)
Pre visit review using our clinic review tool, if applicable. No additional management support is needed unless otherwise documented below in the visit note. 

## 2013-12-22 NOTE — Addendum Note (Signed)
Addended by: Carter Kitten on: 12/22/2013 05:51 PM   Modules accepted: Orders

## 2013-12-22 NOTE — Patient Instructions (Addendum)
Call to schedule colonoscopy if interested in proceeding. Dr. Rushie Nyhan, [facility]  Stop at lab on way out. QUIT SMOKING. Try electric cigarette.

## 2013-12-22 NOTE — Assessment & Plan Note (Signed)
Due for re-eval. Wants to stop B12 if he can

## 2013-12-22 NOTE — Assessment & Plan Note (Signed)
Due for re-eval. Wants to stop iron if he can

## 2013-12-22 NOTE — Progress Notes (Signed)
Subjective:    Patient ID: [name], male    DOB: 1930/09/01, 80 y.o.   MRN: [id number]  HPI I have personally reviewed the Medicare Annual Wellness questionnaire and have noted 1. The patient's medical and social history 2. Their use of alcohol, tobacco or illicit drugs 3. Their current medications and supplements 4. The patient's functional ability including ADL's, fall risks, home safety risks and hearing or visual             impairment. 5. Diet and physical activities 6. Evidence for depression or mood disorders The patients weight, height, BMI and visual acuity have been recorded in the chart I have made referrals, counseling and provided education to the patient based review of the above and I have provided the pt with a written personalized care plan for preventive services.  Afib, polymorphic ventricular tachycardia, CHF, Has defib: followed by cardiology  DM Due for re-eval. On no med.  FBS 100-120, never < 60, never > 200 Lab Results  Component Value Date   HGBA1C 6.7* 08/22/2012   Wt Readings from Last 3 Encounters:  12/22/13 182 lb (82.555 kg)  06/30/13 186 lb (84.369 kg)  04/20/13 190 lb 12.8 oz (86.546 kg)   Elevated Cholesterol: Due for re-eval on liptor  Using medications without problems:No Muscle aches: NO Diet compliance: Good Exercise:none, limited. Other complaints:  Gout: 1 flare in last year. On colchicine twice daily.? If we could stop this med.  COPD, stable on current meds. QUIT smoking. SOB on spiriva,  BPH: on avodart and flomax  prostate adenocarcinoma  Hypertension:   BP Readings from Last 3 Encounters:  12/22/13 114/72  06/30/13 98/70  04/20/13 132/70  Using medication without problems or lightheadedness:  Chest pain with exertion:None Edema:None Short of breath: stable Average home BPs: good control Other issues:  Review of Systems  Constitutional: Negative for fever and fatigue.  HENT: Negative for ear pain.   Eyes:  Negative for pain.  Respiratory: Negative for shortness of breath.   Cardiovascular: Negative for chest pain, palpitations and leg swelling.  Gastrointestinal: Negative for abdominal pain.       Objective:   Physical Exam  Constitutional: Vital signs are normal. He appears well-developed and well-nourished.  HENT:  Head: Normocephalic.  Right Ear: Hearing normal.  Left Ear: Hearing normal.  Nose: Nose normal.  Mouth/Throat: Oropharynx is clear and moist and mucous membranes are normal.  Neck: Trachea normal. Carotid bruit is not present. No mass and no thyromegaly present.  Cardiovascular: Normal rate, regular rhythm and normal pulses.  Exam reveals distant heart sounds. Exam reveals no gallop and no friction rub.   No murmur heard. No peripheral edema  Pulmonary/Chest: Effort normal and breath sounds normal. No respiratory distress.  Skin: Skin is warm, dry and intact. No rash noted.  Psychiatric: He has a normal mood and affect. His speech is normal and behavior is normal. Thought content normal.    Diabetic foot exam: Normal inspection No skin breakdown No calluses  Normal DP pulses Normal sensation to light touch and monofilament Nails thickened       Assessment & Plan:  The patient's preventative maintenance and recommended screening tests for an annual wellness exam were reviewed in full today. Brought up to date unless services declined.  Counselled on the importance of diet, exercise, and its role in overall health and mortality. The patient's FH and SH was reviewed, including their home life, tobacco status, and drug and alcohol status.  Vaccines:uptodate except for prevnar and flu COLON: 2010, repeat in 5 years if medically fit. Dr. Henrene Pastor PSA followed by urologist Hx of prostate cancer  restarted smoking HCPOA: [name], daughter  Animal recall 17/30sec

## 2013-12-22 NOTE — Assessment & Plan Note (Signed)
At next OV perform full MMSE. Brief was abnormal and decreases animal recall.  Per daughter after OV.Marland Kitchen Difficulty remembering things, misplacing things.

## 2013-12-22 NOTE — Assessment & Plan Note (Signed)
Check uric acid.,  ? If he can stop colchicine or if he needs to change to allopurinol.

## 2013-12-22 NOTE — Assessment & Plan Note (Signed)
Well controlled. Continue current medication.  

## 2013-12-23 ENCOUNTER — Other Ambulatory Visit: Payer: Self-pay | Admitting: Family Medicine

## 2013-12-25 ENCOUNTER — Encounter: Payer: Self-pay | Admitting: *Deleted

## 2013-12-25 MED ORDER — ALLOPURINOL 100 MG PO TABS: 100.0000 mg | ORAL_TABLET | Freq: Every day | ORAL | Status: DC

## 2013-12-27 ENCOUNTER — Encounter: Payer: Self-pay | Admitting: Internal Medicine

## 2013-12-28 ENCOUNTER — Other Ambulatory Visit: Payer: Self-pay | Admitting: Family Medicine

## 2013-12-28 ENCOUNTER — Other Ambulatory Visit: Payer: Self-pay | Admitting: *Deleted

## 2013-12-28 MED ORDER — TIOTROPIUM BROMIDE MONOHYDRATE 18 MCG IN CAPS: ORAL_CAPSULE | RESPIRATORY_TRACT | Status: DC

## 2014-01-05 ENCOUNTER — Other Ambulatory Visit: Payer: Self-pay | Admitting: Family Medicine

## 2014-01-15 ENCOUNTER — Ambulatory Visit (INDEPENDENT_AMBULATORY_CARE_PROVIDER_SITE_OTHER): Payer: Medicare Other | Admitting: Family Medicine

## 2014-01-15 ENCOUNTER — Ambulatory Visit: Payer: Medicare Other | Admitting: Internal Medicine

## 2014-01-15 ENCOUNTER — Encounter: Payer: Self-pay | Admitting: Family Medicine

## 2014-01-15 VITALS — BP 100/60 | HR 76 | Temp 98.1°F | Ht 67.0 in | Wt 182.5 lb

## 2014-01-15 DIAGNOSIS — S80822A Blister (nonthermal), left lower leg, initial encounter: Secondary | ICD-10-CM | POA: Diagnosis not present

## 2014-01-15 DIAGNOSIS — I5032 Chronic diastolic (congestive) heart failure: Secondary | ICD-10-CM | POA: Diagnosis not present

## 2014-01-15 DIAGNOSIS — M7022 Olecranon bursitis, left elbow: Secondary | ICD-10-CM

## 2014-01-15 NOTE — Progress Notes (Signed)
Pre visit review using our clinic review tool, if applicable. No additional management support is needed unless otherwise documented below in the visit note. 

## 2014-01-15 NOTE — Progress Notes (Signed)
Dr. Frederico Hamman T. Terique Kawabata, MD, [facility] [place], [address] Phone: [phone] Fax: [phone]  01/15/2014  Patient: [name], MRN: [id number], DOB: 1930/07/17, 80 y.o.  Primary Physician:  Eliezer Lofts, MD  Chief Complaint: Leg Leaking Fluid, Joint Swelling and Extremity Weakness  Subjective:   [name] is a 80 y.o. very pleasant male patient who presents with the following:  Had a fluid blister on left lower extremity. Does not hurt, about the size of a nickel. Has been doing a lot of yardwork. No significant swelling. No sob. Taking all cardiac meds.   Olecranon bursitis, 4 months on the left.   Wt Readings from Last 3 Encounters:  01/15/14 182 lb 8 oz (82.781 kg)  12/22/13 182 lb (82.555 kg)  06/30/13 186 lb (84.369 kg)     Past Medical History, Surgical History, Social History, Family History, Problem List, Medications, and Allergies have been reviewed and updated if relevant.   GEN: No acute illnesses, no fevers, chills. GI: No n/v/d, eating normally Pulm: No SOB Interactive and getting along well at home.  Otherwise, ROS is as per the HPI.  Objective:   BP 100/60  Pulse 76  Temp(Src) 98.1 F (36.7 C) (Oral)  Ht 5\' 7"  (1.702 m)  Wt 182 lb 8 oz (82.781 kg)  BMI 28.58 kg/m2  SpO2 99%  GEN: WDWN, NAD, Non-toxic, A & O x 3 HEENT: Atraumatic, Normocephalic. Neck supple. No masses, No LAD. Ears and Nose: No external deformity. EXTR: No c/c. Tr edema on the L. NEURO Normal gait.  PSYCH: Normally interactive. Conversant. Not depressed or anxious appearing.  Calm demeanor.   Olecranon bursitis, L elbow, rel small  Laboratory and Imaging Data:  Assessment and Plan:   Blister of left leg without infection, initial encounter  Chronic diastolic heart failure  Olecranon bursitis of left elbow  >25 minutes spent in face to face time with patient, >50% spent in counselling or  coordination of care: additional time spent on chart review, cardiology notes review, procedural note review, other recent office notes.  Medically complex patient, but to me this blister is relatively unremarkable, he has very thin skin, and most likely the result of recent intense yardwork. And trauma / contact. No swelling to suggest chf exac.   Chronic aseptic olecranon bursitis. At this point not drainable. Recommended not traumatizing this further. Bursectomy would be very poor idea in this patient. May have some permanent change here.   He also had question about LE strength, which is really remarkably good throughout the B LE with 5/5 str  Signed,  Frederico Hamman T. Leotha Voeltz, MD   Patient's Medications  New Prescriptions   No medications on file  Previous Medications   ACETAMINOPHEN (TYLENOL) 500 MG TABLET    Take 500 mg by mouth every 6 (six) hours as needed for mild pain.    ALBUTEROL (PROAIR HFA) 108 (90 BASE) MCG/ACT INHALER    Inhale 2 puffs into the lungs every 6 (six) hours as needed for wheezing or shortness of breath.   ALLOPURINOL (ZYLOPRIM) 100 MG TABLET    Take 1 tablet (100 mg total) by mouth daily.   ASPIRIN 81 MG TABLET    Take 81 mg by mouth 2 (two) times daily.   ATORVASTATIN (LIPITOR) 20 MG TABLET    TAKE 1 TABLET DAILY AT     BEDTIME   AVODART 0.5 MG CAPSULE    TAKE 1  CAPSULE DAILY   CARVEDILOL (COREG) 3.125 MG TABLET    TAKE 1 TABLET TWICE A DAY  WITH MEALS   CHOLECALCIFEROL (TH VITAMIN D3) 2000 UNITS CAPS    Take 2,000 Units by mouth daily.    DEXILANT 60 MG CAPSULE    TAKE 1 CAPSULE DAILY   EPLERENONE (INSPRA) 25 MG TABLET    TAKE 1/2 TABLET DAILY   FUROSEMIDE (LASIX) 40 MG TABLET    TAKE 1 TABLET DAILY   GABAPENTIN (NEURONTIN) 300 MG CAPSULE    TAKE 1 CAPSULE 4 TIMES     DAILY   GLUCOSE BLOOD (ACCU-CHEK AVIVA PLUS) TEST STRIP    USE AND DISCARD 1 TEST     STRIP DAILY AND AS NEEDED  FOR DIABETES.DX 250.00   GUAIFENESIN (MUCINEX) 600 MG 12 HR TABLET    Take 1,200 mg  by mouth 2 (two) times daily as needed for cough.    KLOR-CON M20 20 MEQ TABLET    TAKE 1 TABLET TWICE A DAY   LISINOPRIL (PRINIVIL,ZESTRIL) 2.5 MG TABLET    TAKE 1 TABLET DAILY   TAMSULOSIN (FLOMAX) 0.4 MG CAPS CAPSULE    Take 0.4 mg by mouth daily after supper.   TIOTROPIUM (SPIRIVA HANDIHALER) 18 MCG INHALATION CAPSULE    INHALE THE CONTENTS OF ONE CAPSULE DAILY IN THE       MORNING VIA HANDIHALER     DEVICE   TRAMADOL (ULTRAM) 50 MG TABLET    Take 50 mg by mouth every 6 (six) hours as needed. For pain  Modified Medications   No medications on file  Discontinued Medications   No medications on file

## 2014-01-16 ENCOUNTER — Telehealth: Payer: Self-pay | Admitting: Family Medicine

## 2014-01-16 NOTE — Telephone Encounter (Signed)
Ms [name] came in this morning to notify 2 back braces one for each pt, a knee brace for [name]. Electric stimulation for pain for his back, has been approved through [employer]. Will need a visit after it arrives to show how to use it and then one week after that visit to make sure that it is being used right. Ms [name] said that company who is sending it is going to fax [place] to get approval from PCP and send it back to company.  She is requesting a call back once this has been done.

## 2014-01-16 NOTE — Telephone Encounter (Signed)
I am not sure what I need to do. I have never shown anyone how to use this. Does their rep not do this?  What do I need to document for approval? OV? Forms?

## 2014-01-16 NOTE — Telephone Encounter (Signed)
Agreed -

## 2014-01-16 NOTE — Telephone Encounter (Signed)
[name] notified that Dr. Diona Browner would be happy to sign the orders but she is not sure about US showing the patient how to use the device.  [name] states the company will be faxing over the orders for Dr. Diona Browner to sign.  She also states that if Dr. Diona Browner is not comfortable showing  them how to use the device, then she will have one of their other doctors, like Dr. Noemi Chapel to show them.

## 2014-01-19 ENCOUNTER — Encounter: Payer: Self-pay | Admitting: Family Medicine

## 2014-01-19 DIAGNOSIS — M48061 Spinal stenosis, lumbar region without neurogenic claudication: Secondary | ICD-10-CM | POA: Insufficient documentation

## 2014-01-25 ENCOUNTER — Telehealth: Payer: Self-pay | Admitting: Family Medicine

## 2014-01-25 ENCOUNTER — Other Ambulatory Visit: Payer: Self-pay | Admitting: Family Medicine

## 2014-01-25 NOTE — Telephone Encounter (Signed)
[name] said [employer] will be faxing a form for Diabetic Shoes and Diabetic meter and test strips.  She said the meter doesn't require blood. Medicare and [employer] have approved both.  [name] is asking for you to sign the form.

## 2014-01-29 ENCOUNTER — Telehealth: Payer: Self-pay

## 2014-01-29 MED ORDER — CYCLOBENZAPRINE HCL 5 MG PO TABS: 5.0000 mg | ORAL_TABLET | Freq: Every day | ORAL | Status: DC

## 2014-01-29 NOTE — Telephone Encounter (Signed)
If not improving with muscle relaxant, stretching and heat, follow up.

## 2014-01-29 NOTE — Telephone Encounter (Signed)
[name] left v/m; [place] and pt was trimming bushes at beach and pulled muscle in leg at thigh and [name] request muscle relaxant pt has been given before. I dont see muscle relaxant on current or hx med list and [name] does not know name of muscle relaxant pt has taken before. [name] request muscle relaxant (not Tramadol) sent to [facility] [place].[name] request cb.

## 2014-01-30 ENCOUNTER — Telehealth: Payer: Self-pay

## 2014-01-30 NOTE — Telephone Encounter (Signed)
[name] with [employer] said received blank form for back and knee braces and shoes. [name] wants to know if this was error in faxing or is this a denial of medical equipment. [name] request cb. Ref # [id number].

## 2014-01-30 NOTE — Telephone Encounter (Signed)
[name] notified as instructed by telephone.

## 2014-01-30 NOTE — Telephone Encounter (Signed)
Spoke with Puah with [employer].  She states they received the fax for the Diabetic Shoes but not for the back brace and knee brace. Forms refaxed to [place] at [phone].

## 2014-02-02 DIAGNOSIS — M47812 Spondylosis without myelopathy or radiculopathy, cervical region: Secondary | ICD-10-CM | POA: Diagnosis not present

## 2014-02-02 DIAGNOSIS — Z7982 Long term (current) use of aspirin: Secondary | ICD-10-CM | POA: Diagnosis not present

## 2014-02-02 DIAGNOSIS — M542 Cervicalgia: Secondary | ICD-10-CM | POA: Diagnosis not present

## 2014-02-02 DIAGNOSIS — E119 Type 2 diabetes mellitus without complications: Secondary | ICD-10-CM | POA: Diagnosis not present

## 2014-02-02 DIAGNOSIS — M4802 Spinal stenosis, cervical region: Secondary | ICD-10-CM | POA: Diagnosis not present

## 2014-02-02 DIAGNOSIS — I10 Essential (primary) hypertension: Secondary | ICD-10-CM | POA: Diagnosis not present

## 2014-02-02 DIAGNOSIS — Z79899 Other long term (current) drug therapy: Secondary | ICD-10-CM | POA: Diagnosis not present

## 2014-02-02 DIAGNOSIS — E785 Hyperlipidemia, unspecified: Secondary | ICD-10-CM | POA: Diagnosis not present

## 2014-02-02 DIAGNOSIS — Z95810 Presence of automatic (implantable) cardiac defibrillator: Secondary | ICD-10-CM | POA: Diagnosis not present

## 2014-02-02 DIAGNOSIS — Z79891 Long term (current) use of opiate analgesic: Secondary | ICD-10-CM | POA: Diagnosis not present

## 2014-02-02 DIAGNOSIS — J449 Chronic obstructive pulmonary disease, unspecified: Secondary | ICD-10-CM | POA: Diagnosis not present

## 2014-02-02 DIAGNOSIS — M436 Torticollis: Secondary | ICD-10-CM | POA: Diagnosis not present

## 2014-02-02 DIAGNOSIS — F17210 Nicotine dependence, cigarettes, uncomplicated: Secondary | ICD-10-CM | POA: Diagnosis not present

## 2014-02-02 NOTE — Telephone Encounter (Signed)
[name] states that they never received the fax for the back brace or knee brace.  I advised I faxed these forms to [facility] on 01/30/2014. Forms refaxed to Attn: Whitney at [phone].

## 2014-02-02 NOTE — Telephone Encounter (Signed)
Whitney with [employer] said faxes received state already faxed but [employer] never got a fax if back and knee braces were approved or denied. Whitney request cb.

## 2014-02-07 ENCOUNTER — Telehealth: Payer: Self-pay | Admitting: Family Medicine

## 2014-02-07 NOTE — Telephone Encounter (Signed)
Forms faxed again to Attn: Whitney at [phone] with a message that this is the absolute last time I will fax these forms.

## 2014-02-07 NOTE — Telephone Encounter (Signed)
[employer] called to get the form for the knee and back brace again.  I let her know you already faxed the for twice and she's insisting it be faxed to her again.  Ref PP#[id number]  Fax #[phone].

## 2014-02-19 NOTE — Telephone Encounter (Signed)
[name] from [place] called again stating she still has not received back and knee brace approval. I informed her you have faxed several times to the below fax number. She has double checked and still does not have forms. She needs to know if they were approved?  Please call [name] back at [phone]  Ref # [id number]

## 2014-02-19 NOTE — Telephone Encounter (Signed)
[employer] notified forms have been faxed numerous time and I have spoken with patient's daughter who states [name] has already received the back and knee brace.  Forms faxed again to [phone] as requested.

## 2014-02-27 ENCOUNTER — Ambulatory Visit (INDEPENDENT_AMBULATORY_CARE_PROVIDER_SITE_OTHER): Payer: Medicare Other | Admitting: Family Medicine

## 2014-02-27 ENCOUNTER — Encounter: Payer: Self-pay | Admitting: Family Medicine

## 2014-02-27 VITALS — BP 102/56 | HR 71 | Temp 97.7°F | Ht 67.0 in | Wt 187.5 lb

## 2014-02-27 DIAGNOSIS — J069 Acute upper respiratory infection, unspecified: Secondary | ICD-10-CM | POA: Insufficient documentation

## 2014-02-27 DIAGNOSIS — J42 Unspecified chronic bronchitis: Secondary | ICD-10-CM

## 2014-02-27 DIAGNOSIS — B9789 Other viral agents as the cause of diseases classified elsewhere: Secondary | ICD-10-CM

## 2014-02-27 DIAGNOSIS — M109 Gout, unspecified: Secondary | ICD-10-CM | POA: Diagnosis not present

## 2014-02-27 DIAGNOSIS — M436 Torticollis: Secondary | ICD-10-CM

## 2014-02-27 DIAGNOSIS — R1030 Lower abdominal pain, unspecified: Secondary | ICD-10-CM | POA: Diagnosis not present

## 2014-02-27 DIAGNOSIS — R109 Unspecified abdominal pain: Secondary | ICD-10-CM | POA: Insufficient documentation

## 2014-02-27 MED ORDER — COLCHICINE 0.6 MG PO TABS: ORAL_TABLET | ORAL | Status: DC

## 2014-02-27 NOTE — Assessment & Plan Note (Signed)
No current flare. No change in sputum.

## 2014-02-27 NOTE — Patient Instructions (Addendum)
Continue allopurinol. If you have a gout flare you can use colchiciine as needed to treat pain for a few days. Stop at lab on way out for uric acid. Mucinex DM twice daily for cough. Continue nasal saline spray.

## 2014-02-27 NOTE — Assessment & Plan Note (Signed)
Symptomatic care 

## 2014-02-27 NOTE — Progress Notes (Signed)
Pre visit review using our clinic review tool, if applicable. No additional management support is needed unless otherwise documented below in the visit note. 

## 2014-02-27 NOTE — Assessment & Plan Note (Signed)
Resolved with Heat, pain med, gentle stretching.

## 2014-02-27 NOTE — Assessment & Plan Note (Signed)
Resolved now, likely due to constipation, functional/gas pain. No further issues.

## 2014-02-27 NOTE — Assessment & Plan Note (Signed)
Can use colchicine for flare. Rx given. Continue allopurinol. Will check uric acid to see if improved with this med.

## 2014-02-27 NOTE — Progress Notes (Addendum)
   Subjective:    Patient ID: [name], male    DOB: December 14, 1930, 80 y.o.   MRN: [id number]  HPI  80 year old male presents [place] the beach [place]2014  for torticollis. Started after yard work with shears.  Reviewed notes in care everywhere.  Cervical spine CT: showed PREVIOUS ANTERIOR FUSION AGAIN NOTED.  MULTILEVEL DEGENERATIVE CHANGE AND FORAMINAL NARROWING.  SLIGHTLY PROGRESSIVE EROSIONS ABOUT THE ODONTOID WITHOUT FRACTURE EVIDENT. Recommended home PT and heat. Given pain med.  Today he reports pain is much better now. Improved after 2 days.  He also had ? Gout flare in left great toe and left ankle. Swelling and redness in left ankle. Almost all better now.  Hx of gout. He is on allopurinol for prevention. Uric acid 8.2 in 12/2013  2 weeks ago had sharp pain in stomach last 3 min. Went away on its own., Returned once but none since.  He has been having runny nose, cough in last few days No fever. NO SOB. No sinus pain or ear pain. Using nasal saline spray which helps.  Review of Systems  Constitutional: Negative for fever and fatigue.  HENT: Negative for ear pain.   Eyes: Negative for pain.  Respiratory: Negative for shortness of breath.   Cardiovascular: Negative for chest pain.       Objective:   Physical Exam  Constitutional: Vital signs are normal. He appears well-developed and well-nourished.  HENT:  Head: Normocephalic.  Right Ear: Hearing normal.  Left Ear: Hearing normal.  Nose: Nose normal.  Mouth/Throat: Oropharynx is clear and moist and mucous membranes are normal.  Neck: Trachea normal. Carotid bruit is not present. No thyroid mass and no thyromegaly present.  Cardiovascular: Normal rate, regular rhythm and normal pulses.  Exam reveals distant heart sounds. Exam reveals no gallop and no friction rub.   No murmur heard. Left leg 1 plus pitting edema  Neg homan's sign  Pulmonary/Chest: Effort normal and breath sounds normal.  No respiratory distress.  Musculoskeletal:       Left ankle: He exhibits decreased range of motion and swelling. No tenderness. No lateral malleolus and no medial malleolus tenderness found.       Cervical back: He exhibits decreased range of motion. He exhibits no tenderness and no bony tenderness.  Skin: Skin is warm, dry and intact. No rash noted.  Psychiatric: He has a normal mood and affect. His speech is normal and behavior is normal. Thought content normal.          Assessment & Plan:

## 2014-02-28 LAB — URIC ACID: Uric Acid, Serum: 5 mg/dL (ref 4.0–7.8)

## 2014-03-01 ENCOUNTER — Ambulatory Visit (INDEPENDENT_AMBULATORY_CARE_PROVIDER_SITE_OTHER): Payer: Medicare Other | Admitting: Internal Medicine

## 2014-03-01 ENCOUNTER — Encounter: Payer: Self-pay | Admitting: Internal Medicine

## 2014-03-01 VITALS — BP 100/60 | HR 73 | Ht 67.0 in | Wt 188.0 lb

## 2014-03-01 DIAGNOSIS — I482 Chronic atrial fibrillation, unspecified: Secondary | ICD-10-CM

## 2014-03-01 DIAGNOSIS — Z4502 Encounter for adjustment and management of automatic implantable cardiac defibrillator: Secondary | ICD-10-CM

## 2014-03-01 DIAGNOSIS — I429 Cardiomyopathy, unspecified: Secondary | ICD-10-CM | POA: Diagnosis not present

## 2014-03-01 DIAGNOSIS — I5032 Chronic diastolic (congestive) heart failure: Secondary | ICD-10-CM

## 2014-03-01 DIAGNOSIS — I428 Other cardiomyopathies: Secondary | ICD-10-CM

## 2014-03-01 LAB — MDC_IDC_ENUM_SESS_TYPE_INCLINIC
Battery Remaining Longevity: 75 mo
Battery Voltage: 2.97 V
Brady Statistic AP VP Percent: 0 %
Brady Statistic AS VS Percent: 2.07 %
Brady Statistic RV Percent Paced: 98.44 %
HIGH POWER IMPEDANCE MEASURED VALUE: 209 Ohm
HighPow Impedance: 41 Ohm
HighPow Impedance: 51 Ohm
Lead Channel Impedance Value: 4047 Ohm
Lead Channel Impedance Value: 513 Ohm
Lead Channel Pacing Threshold Amplitude: 0.75 V
Lead Channel Pacing Threshold Pulse Width: 0.4 ms
Lead Channel Sensing Intrinsic Amplitude: 0.5 mV
Lead Channel Sensing Intrinsic Amplitude: 10.125 mV
Lead Channel Setting Pacing Amplitude: 2.5 V
Lead Channel Setting Pacing Amplitude: 2.5 V
Lead Channel Setting Pacing Pulse Width: 0.4 ms
Lead Channel Setting Pacing Pulse Width: 0.8 ms
Lead Channel Setting Sensing Sensitivity: 0.3 mV
MDC IDC MSMT LEADCHNL LV IMPEDANCE VALUE: 4047 Ohm
MDC IDC MSMT LEADCHNL LV PACING THRESHOLD AMPLITUDE: 2.25 V
MDC IDC MSMT LEADCHNL LV PACING THRESHOLD PULSEWIDTH: 0.8 ms
MDC IDC MSMT LEADCHNL RA IMPEDANCE VALUE: 456 Ohm
MDC IDC MSMT LEADCHNL RA SENSING INTR AMPL: 0.25 mV
MDC IDC MSMT LEADCHNL RV IMPEDANCE VALUE: 513 Ohm
MDC IDC MSMT LEADCHNL RV SENSING INTR AMPL: 10.125 mV
MDC IDC SESS DTM: 20151119125150
MDC IDC SET ZONE DETECTION INTERVAL: 300 ms
MDC IDC STAT BRADY AP VS PERCENT: 0 %
MDC IDC STAT BRADY AS VP PERCENT: 97.93 %
MDC IDC STAT BRADY RA PERCENT PACED: 0 %
Zone Setting Detection Interval: 350 ms
Zone Setting Detection Interval: 350 ms
Zone Setting Detection Interval: 450 ms

## 2014-03-01 NOTE — Progress Notes (Signed)
Patient Care Team: Jinny Sanders, MD as PCP - General (Family Medicine) Jolaine Artist, MD (Cardiology) Jolaine Artist, MD (Cardiology) Deboraha Sprang, MD (Cardiology)   HPI  [name] is a 80 y.o. male  Seen in followup for CRT-D implantation in the setting of nonischemic myopathy and atrial fibrillation. Previously he was on warfarin. This was discontinued because of GI bleeding and he has been treated with aspirin  He is status post AV junction ablation. He has had intercurrent normalization of LV systolic function.  November 2010 he had syncope with appropriate VF therapy For polymorphic ventricular tachycardia he has some lightheadedness with standing but no syncope His lisinopril was decreased 10>>5   His device reached ERI 10/14 and he underwent generator replacement as an ICD CRT at that time. The patient denies chest pain, shortness of breath, nocturnal dyspnea, orthopnea or peripheral edema.  There have been no palpitations, lightheadedness or syncope.  He mostly complains of weakness in his legs  He has been followed by the heart care clinic in this it'll to be seen in January at one year intervals       Past Medical History  Diagnosis Date  . COPD (chronic obstructive pulmonary disease)     GOLD II; Spirometry 07/10/2008 >FEV1 1.46 56% predicted, ratio of 66%  . Anemia     iron deficiency anemia with previous severe GI bleed   . Hypertension   . Hyperlipidemia   . Left bundle branch block   . Prostatic hypertrophy 09-11-97    Benign  . Obesity   . CHF (congestive heart failure)     secondary to nonischemic cardiomyopathy; ECHO 10/07 EF 20-25%, mod to severe MR; ECHO 1/10 EF 55-60%, mild MR; ECHO 2/11 55-65%, grade 1 diast dysfxn, miod to mod LAE, mild TR;    S/P Medtronic BiV ICD with biventricular function now turned off due to diahragmatic simulation  . CAD (coronary artery disease)     Mild, nonobstructive (LHC 1/07: mLAD 20%, pCFX 20-30%, mRCA  30%, EF 25%)  . Atrial fibrillation     s/p AV node ablation; not on coumadin due to GIB  . Cardiac arrest - ventricular fibrillation 02/2008    Aborted, shocked by ICD  . Dyspnea     exertional  . Pneumonia     01/2011  . Diabetes mellitus     type 2 NIDDM x 5-6 yrs  . GERD (gastroesophageal reflux disease)   . Cancer     prostate  . Arthritis     back and knees  . 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 Inguinal hernia     left; asymptomatic    Past Surgical History  Procedure Laterality Date  . Lobectomy  01-21-98    lung  . Rotator cuff repair  1994 and 1995  . Knee surgery  1996    Left  . Cardiac catheterization  01/2006    Showed mild nonobstructive CAD  . Cardiac catheterization  11/05/2006    Right atrial pressure mean of 12, RV pressure 36/8, PA pressure 39/16 witha mean of 28, wedge pressure was 20. Fick cardiac output was 5 liters per minute, cardiac index was 2.4  . Esophagogastroduodenoscopy  09/1997    Prepylor ulcer, esoph ring, duod avm  . Colonoscopy w/ polypectomy  11/1997    Divertics, int hemms  . Mch gi bleed  02/07-02/12/2002  . Esophagogastroduodenoscopy  05/22/2002    Poss Barrett's   .  Colonoscopy  06/26/2002    Multip (neg) divertics, int hemm  . Colonoscopy  07/10/2004    Poyps, divertics, int hemms  . Mch sob  10/11-10/18/2007    A fib, CHF  . Esophagogastroduodenoscopy  02/19/2006    HH No active bleeding  . Ct head limited w/o cm  10/03/2006    No acute abnmlty  . Coronary angioplasty      Min abstrut zd severe LB dystn EF 25%  . Mch x  11/08-11/03/2006    Acute blood loss, anemia, sys HF, isch cardiomyopathy  . Hosp  8/14-8/23/2008    Acute on chronis CHF IIIB NOnisch Cardiomyop EF 20-25% Mod-Sev MR  . Colonoscopy  06/19/2008    2 polyps divertics int hemms (Dr Henrene Pastor)  . Insert / replace / remove pacemaker  2007  . Eye surgery      cataract, bilateral  . Anterior cervical discectomy  02/2010  . Knee arthroscopy   1988  . Lumbar laminectomy/decompression microdiscectomy  04/29/2011    Procedure: LUMBAR LAMINECTOMY/DECOMPRESSION MICRODISCECTOMY;  Surgeon: Eustace Moore, MD;  Location: [place];  Service: Neurosurgery;  Laterality: N/A;  Lumbar Two, Three, Four, Five Decompressive Lumbar Laminectomies    Current Outpatient Prescriptions  Medication Sig Dispense Refill  . acetaminophen (TYLENOL) 500 MG tablet Take 500 mg by mouth every 6 (six) hours as needed for mild pain.     Marland Kitchen albuterol (PROAIR HFA) 108 (90 BASE) MCG/ACT inhaler Inhale 2 puffs into the lungs every 6 (six) hours as needed for wheezing or shortness of breath. 3 Inhaler 3  . allopurinol (ZYLOPRIM) 100 MG tablet Take 1 tablet (100 mg total) by mouth daily. 90 tablet 3  . aspirin 81 MG tablet Take 81 mg by mouth 2 (two) times daily.    Marland Kitchen atorvastatin (LIPITOR) 20 MG tablet TAKE 1 TABLET DAILY AT     BEDTIME 90 tablet 3  . AVODART 0.5 MG capsule TAKE 1 CAPSULE DAILY 90 capsule 1  . carvedilol (COREG) 3.125 MG tablet TAKE 1 TABLET TWICE A DAY  WITH MEALS 180 tablet 3  . Cholecalciferol (TH VITAMIN D3) 2000 UNITS CAPS Take 2,000 Units by mouth daily.     . colchicine (COLCRYS) 0.6 MG tablet 2 tab po x 1 then repeat in 1 hours when have a gout flare 15 tablet 0  . cyclobenzaprine (FLEXERIL) 5 MG tablet Take 1 tablet (5 mg total) by mouth at bedtime. 20 tablet 0  . DEXILANT 60 MG capsule TAKE 1 CAPSULE DAILY 90 capsule 3  . eplerenone (INSPRA) 25 MG tablet TAKE 1/2 TABLET DAILY 45 tablet 3  . furosemide (LASIX) 40 MG tablet TAKE 1 TABLET DAILY 90 tablet 1  . gabapentin (NEURONTIN) 300 MG capsule TAKE 1 CAPSULE 4 TIMES     DAILY 360 capsule 1  . glucose blood (ACCU-CHEK AVIVA PLUS) test strip USE AND DISCARD 1 TEST     STRIP DAILY AND AS NEEDED  FOR DIABETES.DX 250.00 100 each 1  . guaiFENesin (MUCINEX) 600 MG 12 hr tablet Take 1,200 mg by mouth 2 (two) times daily as needed for cough.     Marland Kitchen KLOR-CON M20 20 MEQ tablet TAKE 1 TABLET TWICE A DAY  180 tablet 1  . lisinopril (PRINIVIL,ZESTRIL) 2.5 MG tablet TAKE 1 TABLET DAILY 90 tablet 3  . tamsulosin (FLOMAX) 0.4 MG CAPS capsule Take 0.4 mg by mouth daily after supper.    . tiotropium (SPIRIVA HANDIHALER) 18 MCG inhalation capsule INHALE THE CONTENTS OF  ONE CAPSULE DAILY IN THE       MORNING VIA HANDIHALER     DEVICE 90 capsule 3  . traMADol (ULTRAM) 50 MG tablet Take 50 mg by mouth every 6 (six) hours as needed. For pain     No current facility-administered medications for this visit.    No Known Allergies  Review of Systems negative except from HPI and PMH  Physical Exam BP 100/60 mmHg  Pulse 73  Ht 5\' 7"  (1.702 m)  Wt 85.276 kg (188 lb)  BMI 29.44 kg/m2 Well developed and well nourished in no acute distress HENT normal E scleral and icterus clear Neck Supple JVP flat; carotids brisk and full Clear to ausculation Device pocket well healed; without hematoma or erythema.  There is no tethering Regular rate and rhythm, no murmurs gallops or rub Soft with active bowel sounds No clubbing cyanosis none Edema Alert and oriented, grossly normal motor and sensory function Skin Warm and Dry  ECG demonstrates atrial fibrillation and ventricular pacing with an upright QRS duration in lead V1  Assessment and  Plan  Atrial fibrillation-permanent  Cardiomyopathy-resolved  CRT-D-Medtronic  Complete heart block  Blood work was checked 2 months ago. Potassium level was 4.   He is disinclined to consider anticoagulation; he will continue on aspirin; he had GI bleeding on warfarin  Heart failure symptoms are stable

## 2014-03-01 NOTE — Patient Instructions (Addendum)
Your physician recommends that you continue on your current medications as directed. Please refer to the Current Medication list given to you today.  Remote monitoring is used to monitor your Pacemaker of ICD from home. This monitoring reduces the number of office visits required to check your device to one time per year. It allows [place] to keep an eye on the functioning of your device to ensure it is working properly. You are scheduled for a device check from home on 05/31/14. You may send your transmission at any time that day. If you have a wireless device, the transmission will be sent automatically. After your physician reviews your transmission, you will receive a postcard with your next transmission date.  Your physician wants you to follow-up in: 1 year with Dr. Caryl Comes.  You will receive a reminder letter in the mail two months in advance. If you don't receive a letter, please call our office to schedule the follow-up appointment.  Please follow up with the heart failure clinic in May (not January). A letter will be sent to you as a reminder.

## 2014-03-02 ENCOUNTER — Telehealth: Payer: Self-pay | Admitting: Family Medicine

## 2014-03-02 NOTE — Telephone Encounter (Signed)
[name] called and wanted to let you know They have decided to keep the knee and back braces they are going to change sizes [facility] diabetic supply company will be calling you again  [name] would like you to call her she wants to explain things to you

## 2014-03-02 NOTE — Telephone Encounter (Signed)
Left message for [name] to return my call.

## 2014-03-06 MED ORDER — CYCLOBENZAPRINE HCL 10 MG PO TABS: 10.0000 mg | ORAL_TABLET | Freq: Every day | ORAL | Status: DC

## 2014-03-06 MED ORDER — HYDROCODONE-ACETAMINOPHEN 5-325 MG PO TABS: 1.0000 | ORAL_TABLET | Freq: Four times a day (QID) | ORAL | Status: DC | PRN

## 2014-03-06 NOTE — Telephone Encounter (Signed)
Refill both, no ref   And i will sign in the am

## 2014-03-06 NOTE — Telephone Encounter (Signed)
[name] notified prescription is ready to be picked up at front desk.

## 2014-03-06 NOTE — Telephone Encounter (Signed)
Spoke with [name].  She states they are going to keep the back and knee brace from [place].  They are just going to exchange it for a different size.  Forms from any other company will be denied.  While on the phone, [name] states that her dad is still having muscle spasms and wants to know if Dr. Diona Browner will send in a prescription for Flexeril 10 mg.  He currently has a prescription for 5 mg but he is having to take 2 tablets to get any relief.  Also wants to know if Dr. Diona Browner will also refill his Vicodin 5-325mg  that he was given at the hospital when he hurt his back.  Advised Dr. Diona Browner is out of the office until next week, but I will send a message to Dr. Lorelei Pont to see if he will write for these medications.

## 2014-03-22 ENCOUNTER — Encounter (HOSPITAL_COMMUNITY): Payer: Self-pay | Admitting: Internal Medicine

## 2014-03-26 ENCOUNTER — Encounter: Payer: Self-pay | Admitting: Internal Medicine

## 2014-03-26 ENCOUNTER — Ambulatory Visit (INDEPENDENT_AMBULATORY_CARE_PROVIDER_SITE_OTHER): Payer: Medicare Other | Admitting: Internal Medicine

## 2014-03-26 VITALS — BP 118/62 | HR 71 | Temp 97.7°F | Wt 185.0 lb

## 2014-03-26 DIAGNOSIS — J441 Chronic obstructive pulmonary disease with (acute) exacerbation: Secondary | ICD-10-CM

## 2014-03-26 MED ORDER — HYDROCODONE-HOMATROPINE 5-1.5 MG/5ML PO SYRP: 5.0000 mL | ORAL_SOLUTION | Freq: Three times a day (TID) | ORAL | Status: DC | PRN

## 2014-03-26 MED ORDER — PREDNISONE 10 MG PO TABS: ORAL_TABLET | ORAL | Status: DC

## 2014-03-26 MED ORDER — AZITHROMYCIN 250 MG PO TABS: ORAL_TABLET | ORAL | Status: DC

## 2014-03-26 NOTE — Patient Instructions (Signed)
Cough, Adult  A cough is a reflex that helps clear your throat and airways. It can help heal the body or may be a reaction to an irritated airway. A cough may only last 2 or 3 weeks (acute) or may last more than 8 weeks (chronic).  CAUSES Acute cough:  Viral or bacterial infections. Chronic cough:  Infections.  Allergies.  Asthma.  Post-nasal drip.  Smoking.  Heartburn or acid reflux.  Some medicines.  Chronic lung problems (COPD).  Cancer. SYMPTOMS   Cough.  Fever.  Chest pain.  Increased breathing rate.  High-pitched whistling sound when breathing (wheezing).  Colored mucus that you cough up (sputum). TREATMENT   A bacterial cough may be treated with antibiotic medicine.  A viral cough must run its course and will not respond to antibiotics.  Your caregiver may recommend other treatments if you have a chronic cough. HOME CARE INSTRUCTIONS   Only take over-the-counter or prescription medicines for pain, discomfort, or fever as directed by your caregiver. Use cough suppressants only as directed by your caregiver.  Use a cold steam vaporizer or humidifier in your bedroom or home to help loosen secretions.  Sleep in a semi-upright position if your cough is worse at night.  Rest as needed.  Stop smoking if you smoke. SEEK IMMEDIATE MEDICAL CARE IF:   You have pus in your sputum.  Your cough starts to worsen.  You cannot control your cough with suppressants and are losing sleep.  You begin coughing up blood.  You have difficulty breathing.  You develop pain which is getting worse or is uncontrolled with medicine.  You have a fever. MAKE SURE YOU:   Understand these instructions.  Will watch your condition.  Will get help right away if you are not doing well or get worse. Document Released: 09/26/2010 Document Revised: 06/22/2011 Document Reviewed: 09/26/2010 ExitCare Patient Information 2015 [employer]. This information is not intended  to replace advice given to you by your health care provider. Make sure you discuss any questions you have with your health care provider.  

## 2014-03-26 NOTE — Progress Notes (Signed)
Pre visit review using our clinic review tool, if applicable. No additional management support is needed unless otherwise documented below in the visit note. 

## 2014-03-26 NOTE — Progress Notes (Signed)
HPI  Pt presents to the clinic today with c/o cough and chest congestion. He reports this started 5 days ago. The cough is productive of green mucous. He has had some associated nasal congestion and shortness of breath. He has tried Mucinex and Zyrtec in addition to his prescribed inhalers. He does have COPD.  Review of Systems      Past Medical History  Diagnosis Date  . COPD (chronic obstructive pulmonary disease)     GOLD II; Spirometry 07/10/2008 >FEV1 1.46 56% predicted, ratio of 66%  . Anemia     iron deficiency anemia with previous severe GI bleed   . Hypertension   . Hyperlipidemia   . Left bundle branch block   . Prostatic hypertrophy 09-11-97    Benign  . Obesity   . CHF (congestive heart failure)     secondary to nonischemic cardiomyopathy; ECHO 10/07 EF 20-25%, mod to severe MR; ECHO 1/10 EF 55-60%, mild MR; ECHO 2/11 55-65%, grade 1 diast dysfxn, miod to mod LAE, mild TR;    S/P Medtronic BiV ICD with biventricular function now turned off due to diahragmatic simulation  . CAD (coronary artery disease)     Mild, nonobstructive (LHC 1/07: mLAD 20%, pCFX 20-30%, mRCA 30%, EF 25%)  . Atrial fibrillation     s/p AV node ablation; not on coumadin due to GIB  . Cardiac arrest - ventricular fibrillation 02/2008    Aborted, shocked by ICD  . Dyspnea     exertional  . Pneumonia     01/2011  . Diabetes mellitus     type 2 NIDDM x 5-6 yrs  . GERD (gastroesophageal reflux disease)   . Cancer     prostate  . Arthritis     back and knees  . 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 Inguinal hernia     left; asymptomatic    Family History  Problem Relation Age of Onset  . Heart failure Mother     CHF  . Heart failure Father     CHF  . Hypertension Sister   . Obesity Sister   . Cancer Brother     lung cancer  . Lung cancer Brother   . Liver disease Brother     ETOH  . Alcohol abuse Brother   . Cancer Brother   . COPD Brother   . Cancer Brother   .  Anxiety disorder Brother   . COPD Brother   . Hypertension Sister     History   Social History  . Marital Status: Married    Spouse Name: N/A    Number of Children: N/A  . Years of Education: N/A   Occupational History  . [employer]    Retired  .     Social History Main Topics  . Smoking status: Current Every Day Smoker -- 1.00 packs/day for 60 years    Types: Cigarettes  . Smokeless tobacco: Never Used     Comment: Smoker since 20, quit for 2 years and started back in 03/2008  . Alcohol Use: No  . Drug Use: No  . Sexual Activity: Not on file   Other Topics Concern  . Not on file   Social History Narrative   Has living will: Full Code.   HCPOA: wife and daughter      Married, lives with wife and 1 adopted child    No Known Allergies   Constitutional: Positive fatigue. Denies  headache, fever or  abrupt weight changes.  HEENT:  Positive nasal congestion. Denies eye redness, eye pain, pressure behind the eyes, facial pain, ear pain, ringing in the ears, wax buildup, runny nose or sore throat. Respiratory: Positive cough and shortness of breath. Denies difficulty breathing or shortness of breath.  Cardiovascular: Denies chest pain, chest tightness, palpitations or swelling in the hands or feet.   No other specific complaints in a complete review of systems (except as listed in HPI above).  Objective:   BP 118/62 mmHg  Pulse 71  Temp(Src) 97.7 F (36.5 C) (Oral)  Wt 185 lb (83.915 kg)  SpO2 96%   Wt Readings from Last 3 Encounters:  03/26/14 185 lb (83.915 kg)  03/01/14 188 lb (85.276 kg)  02/27/14 187 lb 8 oz (85.049 kg)     General: Appears his stated age, chronically ill appearing in NAD. HEENT: Head: normal shape and size; Ears: Tm's gray and intact, normal light reflex; Nose: mucosa pink and moist, septum midline; Throat/Mouth: Teeth present, mucosa erythematous and moist, no exudate noted, no lesions or ulcerations noted. No adenopathy  noted. Cardiovascular: Normal rate and rhythm. Distant S1,S2 noted.  No murmur, rubs or gallops noted.  Pulmonary/Chest: Normal effort and coarse breath sounds with bilateral expiratory wheeze noted. No respiratory distress. No rales or ronchi noted.      Assessment & Plan:   COPD exacerbation:  Get some rest and drink plenty of water Continue zyrtec eRx for Azithromax x 5 days eRx for Prednisone x 6 days Rx for Hycodan cough syrup  RTC as needed or if symptoms persist.

## 2014-04-10 ENCOUNTER — Other Ambulatory Visit: Payer: Self-pay | Admitting: Family Medicine

## 2014-04-17 ENCOUNTER — Ambulatory Visit (INDEPENDENT_AMBULATORY_CARE_PROVIDER_SITE_OTHER): Payer: Medicare Other | Admitting: Family Medicine

## 2014-04-17 ENCOUNTER — Encounter: Payer: Self-pay | Admitting: Family Medicine

## 2014-04-17 VITALS — BP 120/76 | HR 86 | Temp 97.9°F | Ht 67.0 in | Wt 189.5 lb

## 2014-04-17 DIAGNOSIS — R609 Edema, unspecified: Secondary | ICD-10-CM

## 2014-04-17 DIAGNOSIS — M109 Gout, unspecified: Secondary | ICD-10-CM

## 2014-04-17 MED ORDER — COLCHICINE 0.6 MG PO TABS: ORAL_TABLET | ORAL | Status: DC

## 2014-04-17 MED ORDER — HYDROCODONE-ACETAMINOPHEN 5-325 MG PO TABS: 1.0000 | ORAL_TABLET | Freq: Four times a day (QID) | ORAL | Status: DC | PRN

## 2014-04-17 NOTE — Progress Notes (Signed)
   Subjective:    Patient ID: [name], male    DOB: Apr 21, 1930, 81 y.o.   MRN: [id number]  HPI 81 year old male with chronic Bilateral leg pain,  Hx of gout,hx of lumbar spinal stenosis, DM, HTN presents with new onset pain in right foot and ankle x 2 weeks. He has noted redness on top pof foot, associated with swelling.  Pain is greatest over MTP joints. He was seen at ER over summer for gout.  He increased his lasix  in last  2 days for increase in swelling in both ankle. This helped some.   Uric acid was 8.2  Was started on allopurinol in 12/2013, he has been taking 2100 mg since. Colchicine did not help much. He has been using vicodin for pain.   Review of Systems  Constitutional: Negative for fever and fatigue.  HENT: Negative for ear pain.   Eyes: Negative for pain.  Respiratory: Negative for cough.   Cardiovascular: Positive for leg swelling. Negative for chest pain and palpitations.  Gastrointestinal: Negative for abdominal pain.       Objective:   Physical Exam  Constitutional: Vital signs are normal. He appears well-developed and well-nourished.  HENT:  Head: Normocephalic.  Right Ear: Hearing normal.  Left Ear: Hearing normal.  Nose: Nose normal.  Mouth/Throat: Oropharynx is clear and moist and mucous membranes are normal.  Neck: Trachea normal. Carotid bruit is not present. No thyroid mass and no thyromegaly present.  Cardiovascular: Normal rate, regular rhythm and normal pulses.  Exam reveals no gallop, no distant heart sounds and no friction rub.   No murmur heard. bilateral 1 plus pitting edema  Pulmonary/Chest: Effort normal and breath sounds normal. No respiratory distress.  Musculoskeletal:  Redness and tenderness in right dorsal foot over MCP joints decreased range of motion.  Skin: Skin is warm, dry and intact. No rash noted.  Psychiatric: He has a normal mood and affect. His speech is normal and behavior is normal. Thought content normal.           Assessment & Plan:

## 2014-04-17 NOTE — Assessment & Plan Note (Signed)
Likely due to inactivity and gout. Treat with elevation, activity as tolerated and increase lasix temporarily.

## 2014-04-17 NOTE — Patient Instructions (Addendum)
Take one more day of increased dose of lasix then return to normal dose.  Elevate leg above your heart.  Get back to walking as much as you can when you can. Start back on colchicine as directed.  Once flare is completely over/ no gout pain, increase allopurinol to 2 tabs daily and return for labs for uric acid in 4 weeks after med change. Can use vicodin for breakthrough pain.

## 2014-04-17 NOTE — Assessment & Plan Note (Addendum)
Uric acid likely remains high despite low dose allopurinol.  Will have him reastart colchicine, burst dose then daily until gout pain controilled. After flare will increase to 200 mg daily allopurinol and recheck uric acid and kidney function  4 weeks after change.  Vicodin for breakthrough pain.

## 2014-04-17 NOTE — Progress Notes (Signed)
Pre visit review using our clinic review tool, if applicable. No additional management support is needed unless otherwise documented below in the visit note. 

## 2014-05-16 ENCOUNTER — Other Ambulatory Visit: Payer: Self-pay | Admitting: Family Medicine

## 2014-05-16 NOTE — Telephone Encounter (Signed)
Last office visit 04/17/2014.  Last refilled 04/11/2014 for #30 with no refills.  Ok to refill?

## 2014-05-31 ENCOUNTER — Ambulatory Visit (INDEPENDENT_AMBULATORY_CARE_PROVIDER_SITE_OTHER): Payer: Medicare Other | Admitting: *Deleted

## 2014-05-31 DIAGNOSIS — I429 Cardiomyopathy, unspecified: Secondary | ICD-10-CM | POA: Diagnosis not present

## 2014-05-31 DIAGNOSIS — I428 Other cardiomyopathies: Secondary | ICD-10-CM

## 2014-05-31 DIAGNOSIS — I5022 Chronic systolic (congestive) heart failure: Secondary | ICD-10-CM | POA: Diagnosis not present

## 2014-05-31 LAB — MDC_IDC_ENUM_SESS_TYPE_REMOTE
Battery Voltage: 2.97 V
Brady Statistic AP VP Percent: 0 %
Brady Statistic AP VS Percent: 0 %
Brady Statistic AS VS Percent: 4.64 %
Brady Statistic RA Percent Paced: 0 %
Date Time Interrogation Session: 20160218153412
HIGH POWER IMPEDANCE MEASURED VALUE: 48 Ohm
HighPow Impedance: 41 Ohm
Lead Channel Impedance Value: 4047 Ohm
Lead Channel Impedance Value: 456 Ohm
Lead Channel Impedance Value: 570 Ohm
Lead Channel Impedance Value: 570 Ohm
Lead Channel Pacing Threshold Pulse Width: 0.4 ms
Lead Channel Pacing Threshold Pulse Width: 0.8 ms
Lead Channel Sensing Intrinsic Amplitude: 10.125 mV
Lead Channel Setting Pacing Amplitude: 2.5 V
Lead Channel Setting Pacing Pulse Width: 0.8 ms
Lead Channel Setting Sensing Sensitivity: 0.3 mV
MDC IDC MSMT BATTERY REMAINING LONGEVITY: 74 mo
MDC IDC MSMT LEADCHNL LV IMPEDANCE VALUE: 4047 Ohm
MDC IDC MSMT LEADCHNL LV PACING THRESHOLD AMPLITUDE: 1.875 V
MDC IDC MSMT LEADCHNL RA IMPEDANCE VALUE: 456 Ohm
MDC IDC MSMT LEADCHNL RA SENSING INTR AMPL: 0.5 mV
MDC IDC MSMT LEADCHNL RA SENSING INTR AMPL: 0.5 mV
MDC IDC MSMT LEADCHNL RV PACING THRESHOLD AMPLITUDE: 0.625 V
MDC IDC MSMT LEADCHNL RV SENSING INTR AMPL: 10.125 mV
MDC IDC SET LEADCHNL RV PACING AMPLITUDE: 2.5 V
MDC IDC SET LEADCHNL RV PACING PULSEWIDTH: 0.4 ms
MDC IDC SET ZONE DETECTION INTERVAL: 350 ms
MDC IDC SET ZONE DETECTION INTERVAL: 350 ms
MDC IDC STAT BRADY AS VP PERCENT: 95.36 %
MDC IDC STAT BRADY RV PERCENT PACED: 96.28 %
Zone Setting Detection Interval: 300 ms
Zone Setting Detection Interval: 450 ms

## 2014-05-31 NOTE — Progress Notes (Signed)
Remote ICD transmission.   

## 2014-06-04 ENCOUNTER — Telehealth: Payer: Self-pay | Admitting: *Deleted

## 2014-06-04 NOTE — Telephone Encounter (Signed)
Received fax from [facility] requesting order for diabetic testing supplies.  Request denied.  Forms were completed back in 01/2014 for [employer] for diabetic testing supplies.

## 2014-06-05 ENCOUNTER — Other Ambulatory Visit: Payer: Self-pay | Admitting: Family Medicine

## 2014-06-08 ENCOUNTER — Other Ambulatory Visit: Payer: Self-pay | Admitting: Family Medicine

## 2014-06-12 ENCOUNTER — Other Ambulatory Visit: Payer: Self-pay | Admitting: Family Medicine

## 2014-06-12 NOTE — Telephone Encounter (Signed)
Last office visit 04/17/2014.  Last refilled 05/16/2014 for #30 with no refills.  Ok to refill?

## 2014-06-19 ENCOUNTER — Other Ambulatory Visit: Payer: Medicare Other

## 2014-06-19 ENCOUNTER — Telehealth: Payer: Self-pay | Admitting: Family Medicine

## 2014-06-19 DIAGNOSIS — E119 Type 2 diabetes mellitus without complications: Secondary | ICD-10-CM

## 2014-06-19 NOTE — Telephone Encounter (Signed)
-----   Message from Ellamae Sia sent at 06/14/2014  3:05 PM EST ----- Regarding: Lab orders for Tuesday,3.8.16 Lab orders for 6 month labs, thanks

## 2014-06-20 ENCOUNTER — Other Ambulatory Visit (INDEPENDENT_AMBULATORY_CARE_PROVIDER_SITE_OTHER): Payer: Medicare Other

## 2014-06-20 ENCOUNTER — Encounter: Payer: Self-pay | Admitting: *Deleted

## 2014-06-20 DIAGNOSIS — E119 Type 2 diabetes mellitus without complications: Secondary | ICD-10-CM | POA: Diagnosis not present

## 2014-06-20 LAB — COMPREHENSIVE METABOLIC PANEL
ALBUMIN: 4 g/dL (ref 3.5–5.2)
ALT: 16 U/L (ref 0–53)
AST: 18 U/L (ref 0–37)
Alkaline Phosphatase: 106 U/L (ref 39–117)
BUN: 22 mg/dL (ref 6–23)
CALCIUM: 9.4 mg/dL (ref 8.4–10.5)
CO2: 28 meq/L (ref 19–32)
Chloride: 106 mEq/L (ref 96–112)
Creatinine, Ser: 0.98 mg/dL (ref 0.40–1.50)
GFR: 77.9 mL/min (ref >60.00)
GLUCOSE: 116 mg/dL — AB (ref 70–99)
POTASSIUM: 4.5 meq/L (ref 3.5–5.1)
Sodium: 138 mEq/L (ref 135–145)
TOTAL PROTEIN: 6.6 g/dL (ref 6.0–8.3)
Total Bilirubin: 0.4 mg/dL (ref 0.2–1.2)

## 2014-06-20 LAB — LIPID PANEL
Cholesterol: 112 mg/dL (ref 0–200)
HDL: 29.6 mg/dL — ABNORMAL LOW (ref >39.00)
LDL CALC: 50 mg/dL (ref 0–99)
NonHDL: 82.4
TRIGLYCERIDES: 162 mg/dL — AB (ref 0.0–149.0)
Total CHOL/HDL Ratio: 4
VLDL: 32.4 mg/dL (ref 0.0–40.0)

## 2014-06-20 LAB — HEMOGLOBIN A1C: Hgb A1c MFr Bld: 7.1 % — ABNORMAL HIGH (ref 4.6–6.5)

## 2014-06-22 ENCOUNTER — Ambulatory Visit (INDEPENDENT_AMBULATORY_CARE_PROVIDER_SITE_OTHER): Payer: Medicare Other | Admitting: Family Medicine

## 2014-06-22 ENCOUNTER — Encounter: Payer: Self-pay | Admitting: Family Medicine

## 2014-06-22 VITALS — BP 138/74 | HR 71 | Temp 98.1°F | Wt 187.5 lb

## 2014-06-22 DIAGNOSIS — M79605 Pain in left leg: Secondary | ICD-10-CM

## 2014-06-22 DIAGNOSIS — M79604 Pain in right leg: Secondary | ICD-10-CM

## 2014-06-22 DIAGNOSIS — J42 Unspecified chronic bronchitis: Secondary | ICD-10-CM | POA: Diagnosis not present

## 2014-06-22 DIAGNOSIS — J309 Allergic rhinitis, unspecified: Secondary | ICD-10-CM | POA: Diagnosis not present

## 2014-06-22 MED ORDER — HYDROCODONE-ACETAMINOPHEN 5-325 MG PO TABS: 1.0000 | ORAL_TABLET | Freq: Four times a day (QID) | ORAL | Status: DC | PRN

## 2014-06-22 NOTE — Progress Notes (Signed)
Pre visit review using our clinic review tool, if applicable. No additional management support is needed unless otherwise documented below in the visit note. 

## 2014-06-22 NOTE — Progress Notes (Signed)
   Subjective:    Patient ID: [name], male    DOB: 03-Mar-1931, 81 y.o.   MRN: [id number]  HPI  81 year old male with history of COPD, CHF, afib, smoker presents with new onset cough, congestion off and on x 1 months. No change in sputum with cough. Nasal congestion Stable SOB. No fever. ? Post nasal drip. Some improvement in last few days.  Has some sneezing , itchy eyes.  Occ using zyrtec. Uses saline Using mucinex as needed. Maintenance with spiriva. Uses albuterol prn, no using more frequently.Marland Kitchen   He requests refill of hydrocodone for pain in legs chronic, peripheral neuropathy. No associated sedation with these meds.  Had rash to tramadol. Review of Systems  Constitutional: Negative for fever, fatigue and unexpected weight change.  HENT: Negative for congestion, ear pain, postnasal drip, rhinorrhea, sore throat and trouble swallowing.   Eyes: Negative for pain.  Respiratory: Negative for cough, shortness of breath and wheezing.   Cardiovascular: Negative for chest pain, palpitations and leg swelling.  Gastrointestinal: Negative for nausea, abdominal pain, diarrhea, constipation and blood in stool.  Genitourinary: Negative for dysuria, urgency, hematuria, discharge, penile swelling, scrotal swelling, difficulty urinating, penile pain and testicular pain.  Skin: Negative for rash.  Neurological: Negative for syncope, weakness, light-headedness, numbness and headaches.  Psychiatric/Behavioral: Negative for behavioral problems and dysphoric mood. The patient is not nervous/anxious.        Objective:   Physical Exam  Constitutional: Vital signs are normal. He appears well-developed and well-nourished.  HENT:  Head: Normocephalic.  Right Ear: Hearing normal.  Left Ear: Hearing normal.  Nose: Nose normal.  Mouth/Throat: Oropharynx is clear and moist and mucous membranes are normal.  Neck: Trachea normal. Carotid bruit is not present. No thyroid mass and no thyromegaly  present.  Cardiovascular: Normal rate, regular rhythm and normal pulses.  Exam reveals no gallop, no distant heart sounds and no friction rub.   No murmur heard. No peripheral edema  Pulmonary/Chest: Effort normal and breath sounds normal. No respiratory distress.  Skin: Skin is warm, dry and intact. No rash noted.  Mobile nodule left posterior leg  Psychiatric: He has a normal mood and affect. His speech is normal and behavior is normal. Thought content normal.          Assessment & Plan:

## 2014-06-22 NOTE — Assessment & Plan Note (Signed)
Tramadol gave SE. Refilled vicodin to use in limited fashion.

## 2014-06-22 NOTE — Assessment & Plan Note (Signed)
Likely cause of congestion. Treat with zyrtec and flonase, mucinex prn, nsasl saline irrigation.

## 2014-06-22 NOTE — Assessment & Plan Note (Signed)
Stable control, no acute exacerbation.

## 2014-06-22 NOTE — Patient Instructions (Addendum)
Continue mucinex as needed. Take zyrtec more regualr, or add flonase 2 sprays per nostril daily.

## 2014-06-26 ENCOUNTER — Other Ambulatory Visit: Payer: Medicare Other

## 2014-06-26 ENCOUNTER — Ambulatory Visit (INDEPENDENT_AMBULATORY_CARE_PROVIDER_SITE_OTHER): Payer: Medicare Other | Admitting: Family Medicine

## 2014-06-26 ENCOUNTER — Encounter: Payer: Self-pay | Admitting: *Deleted

## 2014-06-26 ENCOUNTER — Encounter: Payer: Self-pay | Admitting: Family Medicine

## 2014-06-26 VITALS — BP 124/64 | HR 70 | Temp 98.4°F | Ht 67.0 in | Wt 184.5 lb

## 2014-06-26 DIAGNOSIS — E119 Type 2 diabetes mellitus without complications: Secondary | ICD-10-CM | POA: Diagnosis not present

## 2014-06-26 DIAGNOSIS — E785 Hyperlipidemia, unspecified: Secondary | ICD-10-CM | POA: Diagnosis not present

## 2014-06-26 LAB — HM DIABETES FOOT EXAM

## 2014-06-26 NOTE — Telephone Encounter (Signed)
Encounter opened in error

## 2014-06-26 NOTE — Patient Instructions (Addendum)
Decrease sweet, desserts, sweet tea/soda.  Decrease syrup intake, decrease bread. INcreae fiber in foods.  Decrease butter, change to olive oil. Use Benecol instead of margarine Follow up DM in 3 months with labs prior.  Schedule medicare wellness with labs prior in 6 months.

## 2014-06-26 NOTE — Progress Notes (Signed)
Pre visit review using our clinic review tool, if applicable. No additional management support is needed unless otherwise documented below in the visit note. 

## 2014-06-26 NOTE — Progress Notes (Signed)
   Subjective:    Patient ID: [name], male    DOB: Dec 22, 1930, 81 y.o.   MRN: [id number]  HPI  Diabetes:  Gradual worsening on no medicaiton. Lab Results  Component Value Date   HGBA1C 7.1* 06/20/2014  he has been eating a lot of sweet tea and sweets. Was on amaryl in pastr, but did drop CBGs low at times. Using medications without difficulties: Hypoglycemic episodes:? Hyperglycemic episodes:? Feet problems:none Blood Sugars averaging:  FBS: 127 eye exam within last year: due    Elevated Cholesterol: LDL at goal < 100 on lipitor 20 mg daily Lab Results  Component Value Date   CHOL 112 06/20/2014   HDL 29.60* 06/20/2014   LDLCALC 50 06/20/2014   LDLDIRECT 44.5 05/25/2012   TRIG 162.0* 06/20/2014   CHOLHDL 4 06/20/2014   Using medications without problems:None Muscle aches: None Diet compliance:poor Exercise:none given back history Other complaints:    Review of Systems  Constitutional: Negative for fever and fatigue.  HENT: Negative for ear pain.   Eyes: Negative for pain.  Respiratory: Negative for shortness of breath.   Cardiovascular: Negative for chest pain and leg swelling.       Objective:   Physical Exam  Constitutional: Vital signs are normal. He appears well-developed and well-nourished.  Elderly male in NAD  HENT:  Head: Normocephalic.  Right Ear: Hearing normal.  Left Ear: Hearing normal.  Nose: Nose normal.  Mouth/Throat: Oropharynx is clear and moist and mucous membranes are normal.  Neck: Trachea normal. Carotid bruit is not present. No thyroid mass and no thyromegaly present.  Cardiovascular: Normal rate and normal pulses.  An irregularly irregular rhythm present. Exam reveals no gallop and no friction rub.   No murmur heard. No peripheral edema  Pulmonary/Chest: Effort normal and breath sounds normal. No respiratory distress.  Skin: Skin is warm, dry and intact. No rash noted.  Psychiatric: He has a normal mood and affect. His  speech is normal and behavior is normal. Thought content normal.      Diabetic foot exam: Normal inspection except rigth 2nd toe overhangs great toe No skin breakdown Several calluses  Normal DP pulses Decreased sensation to light touch and monofilament Nails thickened     Assessment & Plan:

## 2014-06-28 ENCOUNTER — Encounter: Payer: Self-pay | Admitting: Internal Medicine

## 2014-06-29 NOTE — Assessment & Plan Note (Signed)
Worsening on no medication. Pt with recent poor diet high in concentrated sweets. Definitely room for improvement. Will work on lifestyle change but if not at goal at next OV in 3 moths, will start back on a medication ( in past was on amaryl)

## 2014-06-29 NOTE — Assessment & Plan Note (Signed)
LDL at goal < 100 on lipitor 20 mg daily

## 2014-07-04 DIAGNOSIS — L821 Other seborrheic keratosis: Secondary | ICD-10-CM | POA: Diagnosis not present

## 2014-07-04 DIAGNOSIS — D239 Other benign neoplasm of skin, unspecified: Secondary | ICD-10-CM | POA: Diagnosis not present

## 2014-07-04 DIAGNOSIS — L570 Actinic keratosis: Secondary | ICD-10-CM | POA: Diagnosis not present

## 2014-08-08 DIAGNOSIS — M109 Gout, unspecified: Secondary | ICD-10-CM | POA: Diagnosis not present

## 2014-08-11 ENCOUNTER — Other Ambulatory Visit: Payer: Self-pay | Admitting: Family Medicine

## 2014-08-14 ENCOUNTER — Other Ambulatory Visit (HOSPITAL_COMMUNITY): Payer: Self-pay | Admitting: *Deleted

## 2014-08-14 MED ORDER — LISINOPRIL 2.5 MG PO TABS: 2.5000 mg | ORAL_TABLET | Freq: Every day | ORAL | Status: DC

## 2014-08-14 MED ORDER — EPLERENONE 25 MG PO TABS: 12.5000 mg | ORAL_TABLET | Freq: Every day | ORAL | Status: DC

## 2014-08-14 MED ORDER — CARVEDILOL 3.125 MG PO TABS: ORAL_TABLET | ORAL | Status: DC

## 2014-08-15 ENCOUNTER — Other Ambulatory Visit (HOSPITAL_COMMUNITY): Payer: Self-pay | Admitting: *Deleted

## 2014-08-16 ENCOUNTER — Telehealth: Payer: Self-pay

## 2014-08-16 NOTE — Telephone Encounter (Signed)
This needs to go to cardiology as I do not prescribe eplerenone.

## 2014-08-16 NOTE — Telephone Encounter (Signed)
[facility] left v/m about drug to drug interaction with Klor Con and Eplerenone. Call back using ref #  [id number].

## 2014-08-20 ENCOUNTER — Ambulatory Visit: Payer: Medicare Other | Admitting: Family Medicine

## 2014-08-21 ENCOUNTER — Telehealth: Payer: Self-pay | Admitting: *Deleted

## 2014-08-21 NOTE — Telephone Encounter (Signed)
Received fax from [facility] requesting order for diabetic testing supplies. Request denied. Forms were completed back in 01/2014 for [employer] for diabetic testing supplies.

## 2014-08-24 ENCOUNTER — Encounter (HOSPITAL_COMMUNITY): Payer: Self-pay

## 2014-08-24 ENCOUNTER — Ambulatory Visit (HOSPITAL_COMMUNITY)
Admission: RE | Admit: 2014-08-24 | Discharge: 2014-08-24 | Disposition: A | Discharge status: Home / Self Care | Payer: Medicare Other | Source: Ambulatory Visit | Attending: Internal Medicine | Admitting: Internal Medicine

## 2014-08-24 VITALS — BP 112/68 | HR 72 | Wt 182.5 lb

## 2014-08-24 DIAGNOSIS — M79606 Pain in leg, unspecified: Secondary | ICD-10-CM | POA: Diagnosis not present

## 2014-08-24 DIAGNOSIS — I10 Essential (primary) hypertension: Secondary | ICD-10-CM | POA: Diagnosis not present

## 2014-08-24 DIAGNOSIS — J449 Chronic obstructive pulmonary disease, unspecified: Secondary | ICD-10-CM

## 2014-08-24 DIAGNOSIS — I482 Chronic atrial fibrillation, unspecified: Secondary | ICD-10-CM

## 2014-08-24 DIAGNOSIS — E119 Type 2 diabetes mellitus without complications: Secondary | ICD-10-CM | POA: Diagnosis not present

## 2014-08-24 DIAGNOSIS — E669 Obesity, unspecified: Secondary | ICD-10-CM | POA: Insufficient documentation

## 2014-08-24 DIAGNOSIS — Z7982 Long term (current) use of aspirin: Secondary | ICD-10-CM | POA: Diagnosis not present

## 2014-08-24 DIAGNOSIS — E785 Hyperlipidemia, unspecified: Secondary | ICD-10-CM | POA: Diagnosis not present

## 2014-08-24 DIAGNOSIS — Z720 Tobacco use: Secondary | ICD-10-CM | POA: Diagnosis not present

## 2014-08-24 DIAGNOSIS — Z79899 Other long term (current) drug therapy: Secondary | ICD-10-CM | POA: Diagnosis not present

## 2014-08-24 DIAGNOSIS — I517 Cardiomegaly: Secondary | ICD-10-CM | POA: Diagnosis not present

## 2014-08-24 DIAGNOSIS — I5022 Chronic systolic (congestive) heart failure: Secondary | ICD-10-CM | POA: Insufficient documentation

## 2014-08-24 DIAGNOSIS — F17200 Nicotine dependence, unspecified, uncomplicated: Secondary | ICD-10-CM | POA: Diagnosis not present

## 2014-08-24 DIAGNOSIS — I429 Cardiomyopathy, unspecified: Secondary | ICD-10-CM | POA: Insufficient documentation

## 2014-08-24 DIAGNOSIS — I2510 Atherosclerotic heart disease of native coronary artery without angina pectoris: Secondary | ICD-10-CM | POA: Diagnosis not present

## 2014-08-24 DIAGNOSIS — Z95810 Presence of automatic (implantable) cardiac defibrillator: Secondary | ICD-10-CM | POA: Insufficient documentation

## 2014-08-24 DIAGNOSIS — I5032 Chronic diastolic (congestive) heart failure: Secondary | ICD-10-CM

## 2014-08-24 NOTE — Addendum Note (Signed)
Encounter addended by: Effie Berkshire, RN on: 08/24/2014 10:01 AM<BR>     Documentation filed: Visit Diagnoses, Dx Association, Patient Instructions Section, Orders

## 2014-08-24 NOTE — Progress Notes (Signed)
Patient ID: [name], male   DOB: 03/23/1931, 81 y.o.   MRN: [id number] EP: Dr Caryl Comes  PCP: Dr Diona Browner  HPI: [name] is an 81 year-old male with a history of a chronic systolic heart failure, NICM,  recovered ejection fraction he is also has h/o chronic atrial fib status post AV node ablation and  BIV ICD implantation (not on coumadin due to h/o GIB). He also has a history of COPD, diabetes, hypertension and hyperlipidemia.   In November 2010, he had syncopal episode and ICD showed VF with appropriate therapy.   Underwent ICD change out in Jan 2011 for ERI without problem. Had echo Feb 2011 ef 55-65%.  In November 2014 he was admitted to [facility] and treated for gout.   Repeat echo 2/15 EF 55-60%  He returns for yearly follow up. Says he feels great from a heart perspective. Did have an episode of gout and fluid retention last week but got better with extra lasix.  No CP. Denies PND/Orthopnea. Mild dyspnea with exertion. Ambulates with a cane. Complains of severe chronic back pain and leg pain/cramping. Weight at home stable. Smokes 1 1/2 ppd.   Labs 02/28/13 K 3.8 Creatinine 0.97 Labs 3/16  K 4.5 Creatinine 0.98  SH: Smoke 1.5 PPD. Does not drink alcohol. Lives with wife   FH: Father and Mother had heart failure . Sister HTN Obesity   ROS: All systems negative except as listed in HPI, PMH and Problem List.  Past Medical History  Diagnosis Date  . COPD (chronic obstructive pulmonary disease)     GOLD II; Spirometry 07/10/2008 >FEV1 1.46 56% predicted, ratio of 66%  . Anemia     iron deficiency anemia with previous severe GI bleed   . Hypertension   . Hyperlipidemia   . Left bundle branch block   . Prostatic hypertrophy 09-11-97    Benign  . Obesity   . CHF (congestive heart failure)     secondary to nonischemic cardiomyopathy; ECHO 10/07 EF 20-25%, mod to severe MR; ECHO 1/10 EF 55-60%, mild MR; ECHO 2/11 55-65%, grade 1 diast dysfxn, miod to mod LAE, mild TR;    S/P [employer]  BiV ICD with biventricular function now turned off due to diahragmatic simulation  . CAD (coronary artery disease)     Mild, nonobstructive (LHC 1/07: mLAD 20%, pCFX 20-30%, mRCA 30%, EF 25%)  . Atrial fibrillation     s/p AV node ablation; not on coumadin due to GIB  . Cardiac arrest - ventricular fibrillation 02/2008    Aborted, shocked by ICD  . Dyspnea     exertional  . Pneumonia     01/2011  . Diabetes mellitus     type 2 NIDDM x 5-6 yrs  . GERD (gastroesophageal reflux disease)   . Cancer     prostate  . Arthritis     back and knees  . 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 Inguinal hernia     left; asymptomatic    Current Outpatient Prescriptions  Medication Sig Dispense Refill  . acetaminophen (TYLENOL) 500 MG tablet Take 500 mg by mouth every 6 (six) hours as needed for mild pain.     Marland Kitchen albuterol (PROAIR HFA) 108 (90 BASE) MCG/ACT inhaler Inhale 2 puffs into the lungs every 6 (six) hours as needed for wheezing or shortness of breath. 3 Inhaler 3  . allopurinol (ZYLOPRIM) 100 MG tablet Take 1 tablet (100 mg total) by  mouth daily. 90 tablet 3  . aspirin 81 MG tablet Take 81 mg by mouth 2 (two) times daily.    Marland Kitchen atorvastatin (LIPITOR) 20 MG tablet TAKE 1 TABLET DAILY AT     BEDTIME 90 tablet 3  . AVODART 0.5 MG capsule TAKE 1 CAPSULE DAILY 90 capsule 1  . carvedilol (COREG) 3.125 MG tablet TAKE 1 TABLET TWICE A DAY  WITH MEALS 180 tablet 3  . Cholecalciferol (TH VITAMIN D3) 2000 UNITS CAPS Take 2,000 Units by mouth daily.     . colchicine (COLCRYS) 0.6 MG tablet 2 tab po x 1 then repeat in 1 hour , then continue 1 tab daily until uric acid at goal with allopurinol. 30 tablet 2  . cyclobenzaprine (FLEXERIL) 10 MG tablet TAKE 1 TABLET (10 MG TOTAL) BY MOUTH AT BEDTIME. 30 tablet 0  . DEXILANT 60 MG capsule TAKE 1 CAPSULE DAILY 90 capsule 1  . eplerenone (INSPRA) 25 MG tablet Take 0.5 tablets (12.5 mg total) by mouth daily. 45 tablet 3  . furosemide (LASIX) 40  MG tablet TAKE 1 TABLET DAILY 90 tablet 3  . gabapentin (NEURONTIN) 300 MG capsule TAKE 1 CAPSULE 4 TIMES     DAILY 360 capsule 1  . glucose blood (ACCU-CHEK AVIVA PLUS) test strip USE AND DISCARD 1 TEST     STRIP DAILY AND AS NEEDED  FOR DIABETES.DX 250.00 100 each 1  . guaiFENesin (MUCINEX) 600 MG 12 hr tablet Take 1,200 mg by mouth 2 (two) times daily as needed for cough.     Marland Kitchen HYDROcodone-acetaminophen (NORCO/VICODIN) 5-325 MG per tablet Take 1 tablet by mouth every 6 (six) hours as needed for moderate pain. 30 tablet 0  . KLOR-CON M20 20 MEQ tablet TAKE 1 TABLET TWICE A DAY 180 tablet 3  . lisinopril (PRINIVIL,ZESTRIL) 2.5 MG tablet Take 1 tablet (2.5 mg total) by mouth daily. 90 tablet 3  . tamsulosin (FLOMAX) 0.4 MG CAPS capsule Take 0.4 mg by mouth daily after supper.    . tiotropium (SPIRIVA HANDIHALER) 18 MCG inhalation capsule INHALE THE CONTENTS OF ONE CAPSULE DAILY IN THE       MORNING VIA HANDIHALER     DEVICE 90 capsule 3   No current facility-administered medications for this encounter.     PHYSICAL EXAM: Filed Vitals:   08/24/14 0859  BP: 112/68  Pulse: 72   General:  Elderly. Well appearing. No resp difficulty. Walks with cane. HEENT: normal Neck: supple. JVP flat. Carotids 2+ bilaterally; no bruits. No lymphadenopathy or thryomegaly appreciated. Cor: PMI normal. Regular rate & rhythm. No rubs, gallops or murmurs. Lungs: clear with markedly decreased breath sounds throughout Abdomen: soft, nontender, nondistended. No hepatosplenomegaly. No bruits or masses. Good bowel sounds. Extremities: no cyanosis, clubbing, rash, tr edema. Distal pulses non palpable Neuro: alert & orientedx3, cranial nerves grossly intact. Moves all 4 extremities w/o difficulty. Affect pleasant.   ASSESSMENT & PLAN: 1. Cardiomyopathy - NICM EF recovered Volume status stable. Continue lasix 40 mg daily and INSPRA 25 mg daily . Continue current dose bb and ace.  2. A  Fib- S/p AVN ablation and  Biv pacing. Not on coumadin due to GIB. Continue aspirin daily   3. Tobacco Abuse- declines smoking cessation. Will check screening CXR at his request.  4. Leg pain - possible claudication. Will schedule ABIs.   Follow up 1 year .    Glori Bickers MD 9:43 AM

## 2014-08-24 NOTE — Patient Instructions (Signed)
Chest X-Ray today to follow up on COPD.  Will schedule you to have Ankle-Brachial Index vascular study done at our [facility]. [place] at [facility]     Address: [address], [place], [place] [address]  [phone]    Follow up 1 year.  Do the following things EVERYDAY: 1) Weigh yourself in the morning before breakfast. Write it down and keep it in a log. 2) Take your medicines as prescribed 3) Eat low salt foods-Limit salt (sodium) to 2000 mg per day.  4) Stay as active as you can everyday 5) Limit all fluids for the day to less than 2 liters

## 2014-08-26 ENCOUNTER — Emergency Department (HOSPITAL_COMMUNITY)
Admission: EM | Admit: 2014-08-26 | Discharge: 2014-08-26 | Disposition: A | Discharge status: Home / Self Care | Payer: Medicare Other | Attending: Emergency Medicine | Admitting: Emergency Medicine

## 2014-08-26 ENCOUNTER — Encounter (HOSPITAL_COMMUNITY): Payer: Self-pay

## 2014-08-26 ENCOUNTER — Other Ambulatory Visit (HOSPITAL_COMMUNITY): Payer: Self-pay

## 2014-08-26 ENCOUNTER — Emergency Department (HOSPITAL_COMMUNITY): Payer: Medicare Other

## 2014-08-26 DIAGNOSIS — E785 Hyperlipidemia, unspecified: Secondary | ICD-10-CM | POA: Diagnosis not present

## 2014-08-26 DIAGNOSIS — Z720 Tobacco use: Secondary | ICD-10-CM | POA: Insufficient documentation

## 2014-08-26 DIAGNOSIS — J449 Chronic obstructive pulmonary disease, unspecified: Secondary | ICD-10-CM | POA: Insufficient documentation

## 2014-08-26 DIAGNOSIS — I4891 Unspecified atrial fibrillation: Secondary | ICD-10-CM | POA: Diagnosis not present

## 2014-08-26 DIAGNOSIS — M159 Polyosteoarthritis, unspecified: Secondary | ICD-10-CM | POA: Diagnosis not present

## 2014-08-26 DIAGNOSIS — Z9861 Coronary angioplasty status: Secondary | ICD-10-CM | POA: Diagnosis not present

## 2014-08-26 DIAGNOSIS — Z8701 Personal history of pneumonia (recurrent): Secondary | ICD-10-CM | POA: Insufficient documentation

## 2014-08-26 DIAGNOSIS — E669 Obesity, unspecified: Secondary | ICD-10-CM | POA: Diagnosis not present

## 2014-08-26 DIAGNOSIS — I509 Heart failure, unspecified: Secondary | ICD-10-CM | POA: Diagnosis not present

## 2014-08-26 DIAGNOSIS — R0902 Hypoxemia: Secondary | ICD-10-CM | POA: Diagnosis not present

## 2014-08-26 DIAGNOSIS — M542 Cervicalgia: Secondary | ICD-10-CM

## 2014-08-26 DIAGNOSIS — Z7982 Long term (current) use of aspirin: Secondary | ICD-10-CM | POA: Diagnosis not present

## 2014-08-26 DIAGNOSIS — I10 Essential (primary) hypertension: Secondary | ICD-10-CM | POA: Diagnosis not present

## 2014-08-26 DIAGNOSIS — Z950 Presence of cardiac pacemaker: Secondary | ICD-10-CM | POA: Insufficient documentation

## 2014-08-26 DIAGNOSIS — E119 Type 2 diabetes mellitus without complications: Secondary | ICD-10-CM | POA: Diagnosis not present

## 2014-08-26 DIAGNOSIS — G8929 Other chronic pain: Secondary | ICD-10-CM | POA: Diagnosis not present

## 2014-08-26 DIAGNOSIS — Z79899 Other long term (current) drug therapy: Secondary | ICD-10-CM | POA: Insufficient documentation

## 2014-08-26 DIAGNOSIS — K219 Gastro-esophageal reflux disease without esophagitis: Secondary | ICD-10-CM | POA: Insufficient documentation

## 2014-08-26 DIAGNOSIS — Z9889 Other specified postprocedural states: Secondary | ICD-10-CM | POA: Insufficient documentation

## 2014-08-26 DIAGNOSIS — Z8546 Personal history of malignant neoplasm of prostate: Secondary | ICD-10-CM | POA: Diagnosis not present

## 2014-08-26 DIAGNOSIS — Z862 Personal history of diseases of the blood and blood-forming organs and certain disorders involving the immune mechanism: Secondary | ICD-10-CM | POA: Insufficient documentation

## 2014-08-26 DIAGNOSIS — N400 Enlarged prostate without lower urinary tract symptoms: Secondary | ICD-10-CM | POA: Diagnosis not present

## 2014-08-26 DIAGNOSIS — M47812 Spondylosis without myelopathy or radiculopathy, cervical region: Secondary | ICD-10-CM | POA: Diagnosis not present

## 2014-08-26 DIAGNOSIS — I2510 Atherosclerotic heart disease of native coronary artery without angina pectoris: Secondary | ICD-10-CM | POA: Diagnosis not present

## 2014-08-26 DIAGNOSIS — M9971 Connective tissue and disc stenosis of intervertebral foramina of cervical region: Secondary | ICD-10-CM | POA: Diagnosis not present

## 2014-08-26 DIAGNOSIS — M5032 Other cervical disc degeneration, mid-cervical region: Secondary | ICD-10-CM | POA: Diagnosis not present

## 2014-08-26 DIAGNOSIS — Z7951 Long term (current) use of inhaled steroids: Secondary | ICD-10-CM | POA: Diagnosis not present

## 2014-08-26 HISTORY — DX: Torticollis: M43.6

## 2014-08-26 HISTORY — DX: Other chronic pain: G89.29

## 2014-08-26 HISTORY — DX: Radiculopathy, cervical region: M54.12

## 2014-08-26 HISTORY — DX: Cervicalgia: M54.2

## 2014-08-26 NOTE — Discharge Instructions (Signed)
Cervical Sprain °A cervical sprain is an injury in the neck in which the strong, fibrous tissues (ligaments) that connect your neck bones stretch or tear. Cervical sprains can range from mild to severe. Severe cervical sprains can cause the neck vertebrae to be unstable. This can lead to damage of the spinal cord and can result in serious nervous system problems. The amount of time it takes for a cervical sprain to get better depends on the cause and extent of the injury. Most cervical sprains heal in 1 to 3 weeks. °CAUSES  °Severe cervical sprains may be caused by:  °· Contact sport injuries (such as from football, rugby, wrestling, hockey, auto racing, gymnastics, diving, martial arts, or boxing).   °· Motor vehicle collisions.   °· Whiplash injuries. This is an injury from a sudden forward and backward whipping movement of the head and neck.  °· Falls.   °Mild cervical sprains may be caused by:  °· Being in an awkward position, such as while cradling a telephone between your ear and shoulder.   °· Sitting in a chair that does not offer proper support.   °· Working at a poorly designed computer station.   °· Looking up or down for long periods of time.   °SYMPTOMS  °· Pain, soreness, stiffness, or a burning sensation in the front, back, or sides of the neck. This discomfort may develop immediately after the injury or slowly, 24 hours or more after the injury.   °· Pain or tenderness directly in the middle of the back of the neck.   °· Shoulder or upper back pain.   °· Limited ability to move the neck.   °· Headache.   °· Dizziness.   °· Weakness, numbness, or tingling in the hands or arms.   °· Muscle spasms.   °· Difficulty swallowing or chewing.   °· Tenderness and swelling of the neck.   °DIAGNOSIS  °Most of the time your health care provider can diagnose a cervical sprain by taking your history and doing a physical exam. Your health care provider will ask about previous neck injuries and any known neck  problems, such as arthritis in the neck. X-rays may be taken to find out if there are any other problems, such as with the bones of the neck. Other tests, such as a CT scan or MRI, may also be needed.  °TREATMENT  °Treatment depends on the severity of the cervical sprain. Mild sprains can be treated with rest, keeping the neck in place (immobilization), and pain medicines. Severe cervical sprains are immediately immobilized. Further treatment is done to help with pain, muscle spasms, and other symptoms and may include: °· Medicines, such as pain relievers, numbing medicines, or muscle relaxants.   °· Physical therapy. This may involve stretching exercises, strengthening exercises, and posture training. Exercises and improved posture can help stabilize the neck, strengthen muscles, and help stop symptoms from returning.   °HOME CARE INSTRUCTIONS  °· Put ice on the injured area.   °¨ Put ice in a plastic bag.   °¨ Place a towel between your skin and the bag.   °¨ Leave the ice on for 15-20 minutes, 3-4 times a day.   °· If your injury was severe, you may have been given a cervical collar to wear. A cervical collar is a two-piece collar designed to keep your neck from moving while it heals. °¨ Do not remove the collar unless instructed by your health care provider. °¨ If you have long hair, keep it outside of the collar. °¨ Ask your health care provider before making any adjustments to your collar. Minor   adjustments may be required over time to improve comfort and reduce pressure on your chin or on the back of your head.  Ifyou are allowed to remove the collar for cleaning or bathing, follow your health care provider's instructions on how to do so safely.  Keep your collar clean by wiping it with mild soap and water and drying it completely. If the collar you have been given includes removable pads, remove them every 1-2 days and hand wash them with soap and water. Allow them to air dry. They should be completely  dry before you wear them in the collar.  If you are allowed to remove the collar for cleaning and bathing, wash and dry the skin of your neck. Check your skin for irritation or sores. If you see any, tell your health care provider.  Do not drive while wearing the collar.   Only take over-the-counter or prescription medicines for pain, discomfort, or fever as directed by your health care provider.   Keep all follow-up appointments as directed by your health care provider.   Keep all physical therapy appointments as directed by your health care provider.   Make any needed adjustments to your workstation to promote good posture.   Avoid positions and activities that make your symptoms worse.   Warm up and stretch before being active to help prevent problems.  SEEK MEDICAL CARE IF:   Your pain is not controlled with medicine.   You are unable to decrease your pain medicine over time as planned.   Your activity level is not improving as expected.  SEEK IMMEDIATE MEDICAL CARE IF:   You develop any bleeding.  You develop stomach upset.  You have signs of an allergic reaction to your medicine.   Your symptoms get worse.   You develop new, unexplained symptoms.   You have numbness, tingling, weakness, or paralysis in any part of your body.  MAKE SURE YOU:   Understand these instructions.  Will watch your condition.  Will get help right away if you are not doing well or get worse. Document Released: 01/25/2007 Document Revised: 04/04/2013 Document Reviewed: 10/05/2012 [facility] Patient Information 2015 [place], [place]. This information is not intended to replace advice given to you by your health care provider. Make sure you discuss any questions you have with your health care provider. Please monitor for new or worsening signs or symptoms, return to emergency room if any present. Please follow up with primary care doctor for further evaluation and management of  chronic neck pain.

## 2014-08-26 NOTE — ED Notes (Signed)
Patient returned from CT

## 2014-08-26 NOTE — ED Provider Notes (Signed)
CSN: [id number]     Arrival date & time 08/26/14  1249 History   First MD Initiated Contact with Patient 08/26/14 1321     Chief Complaint  Patient presents with  . Neck Pain    HPI   83 YOM presents today with neck pain. Pt reports a history of chronic pain throughout his body; specifically his neck. He states that in 2013 his had a spinal fusion surgery that was complicated by hematoma that required second emergent evacuation and fusion. Pt states since then he has had significant weakness and decreased sensation to his upper and lower extremities. He notes that last night after a day of trimming bushes he experienced an increase in his chronic posterior neck pain with radiation down into his left arm. At that time the pain was unbearable and unmanageable with his oral pain medications. This morning the pain continued but was less sever and only associated with movement of his neck. He states that he was able to manage the pain with oral vicodin. His daughter was present at the time of evaluation stating that she was concerned that his hard wear may have come loose. She reports he has had no changes in mental status or baseline functioning in addition to the neck pain. He requires the use of a wheelchair. Pt denies headache, dizziness, hearing changes or abnormal sounds, vision changes, n/v, fever, chest pain, SOB, diaphoresis, heart palpitations or changes in sensory or motor functioning.   Past Medical History  Diagnosis Date  . COPD (chronic obstructive pulmonary disease)     GOLD II; Spirometry 07/10/2008 >FEV1 1.46 56% predicted, ratio of 66%  . Anemia     iron deficiency anemia with previous severe GI bleed   . Hypertension   . Hyperlipidemia   . Left bundle branch block   . Prostatic hypertrophy 09-11-97    Benign  . Obesity   . CHF (congestive heart failure)     secondary to nonischemic cardiomyopathy; ECHO 10/07 EF 20-25%, mod to severe MR; ECHO 1/10 EF 55-60%, mild MR; ECHO 2/11  55-65%, grade 1 diast dysfxn, miod to mod LAE, mild TR;    S/P [employer] BiV ICD with biventricular function now turned off due to diahragmatic simulation  . CAD (coronary artery disease)     Mild, nonobstructive (LHC 1/07: mLAD 20%, pCFX 20-30%, mRCA 30%, EF 25%)  . Atrial fibrillation     s/p AV node ablation; not on coumadin due to GIB  . Cardiac arrest - ventricular fibrillation 02/2008    Aborted, shocked by ICD  . Dyspnea     exertional  . Pneumonia     01/2011  . Diabetes mellitus     type 2 NIDDM x 5-6 yrs  . GERD (gastroesophageal reflux disease)   . Cancer     prostate  . Arthritis     back and knees  . 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 Inguinal hernia     left; asymptomatic  . Chronic neck pain   . Cervical radiculopathy     left  . Torticollis    Past Surgical History  Procedure Laterality Date  . Lobectomy  01-21-98    lung  . Rotator cuff repair  1994 and 1995  . Knee surgery  1996    Left  . Cardiac catheterization  01/2006    Showed mild nonobstructive CAD  . Cardiac catheterization  11/05/2006    Right atrial pressure mean  of 12, RV pressure 36/8, PA pressure 39/16 witha mean of 28, wedge pressure was 20. Fick cardiac output was 5 liters per minute, cardiac index was 2.4  . Esophagogastroduodenoscopy  09/1997    Prepylor ulcer, esoph ring, duod avm  . Colonoscopy w/ polypectomy  11/1997    Divertics, int hemms  . Mch gi bleed  02/07-02/12/2002  . Esophagogastroduodenoscopy  05/22/2002    Poss Barrett's   . Colonoscopy  06/26/2002    Multip (neg) divertics, int hemm  . Colonoscopy  07/10/2004    Poyps, divertics, int hemms  . Mch sob  10/11-10/18/2007    A fib, CHF  . Esophagogastroduodenoscopy  02/19/2006    HH No active bleeding  . Ct head limited w/o cm  10/03/2006    No acute abnmlty  . Coronary angioplasty      Min abstrut zd severe LB dystn EF 25%  . Mch x  11/08-11/03/2006    Acute blood loss, anemia, sys HF, isch  cardiomyopathy  . Hosp  8/14-8/23/2008    Acute on chronis CHF IIIB NOnisch Cardiomyop EF 20-25% Mod-Sev MR  . Colonoscopy  06/19/2008    2 polyps divertics int hemms (Dr Henrene Pastor)  . Insert / replace / remove pacemaker  2007  . Eye surgery      cataract, bilateral  . Anterior cervical discectomy  02/2010  . Knee arthroscopy  1988  . Lumbar laminectomy/decompression microdiscectomy  04/29/2011    Procedure: LUMBAR LAMINECTOMY/DECOMPRESSION MICRODISCECTOMY;  Surgeon: Eustace Moore, MD;  Location: [place] [facility];  Service: Neurosurgery;  Laterality: N/A;  Lumbar Two, Three, Four, Five Decompressive Lumbar Laminectomies  . Biv icd genertaor change out N/A 02/08/2013    Procedure: BIV ICD GENERTAOR CHANGE OUT;  Surgeon: Deboraha Sprang, MD;  Location: [facility] CATH [facility];  Service: Cardiovascular;  Laterality: N/A;   Family History  Problem Relation Age of Onset  . Heart failure Mother     CHF  . Heart failure Father     CHF  . Hypertension Sister   . Obesity Sister   . Cancer Brother     lung cancer  . Lung cancer Brother   . Liver disease Brother     ETOH  . Alcohol abuse Brother   . Cancer Brother   . COPD Brother   . Cancer Brother   . Anxiety disorder Brother   . COPD Brother   . Hypertension Sister    History  Substance Use Topics  . Smoking status: Current Every Day Smoker -- 1.00 packs/day for 60 years    Types: Cigarettes  . Smokeless tobacco: Never Used     Comment: Smoker since 32, quit for 2 years and started back in 03/2008  . Alcohol Use: 0.0 oz/week    0 Standard drinks or equivalent per week    Review of Systems  All other systems reviewed and are negative.     Allergies  Review of patient's allergies indicates no known allergies.  Home Medications   Prior to Admission medications   Medication Sig Start Date End Date Taking? Authorizing Provider  acetaminophen (TYLENOL) 500 MG tablet Take 500 mg by mouth every 6 (six) hours as needed for mild pain.      Historical Provider, MD  albuterol (PROAIR HFA) 108 (90 BASE) MCG/ACT inhaler Inhale 2 puffs into the lungs every 6 (six) hours as needed for wheezing or shortness of breath. 09/13/13   Amy Cletis Athens, MD  allopurinol (ZYLOPRIM) 100 MG tablet Take  1 tablet (100 mg total) by mouth daily. 12/25/13   Amy Cletis Athens, MD  aspirin 81 MG tablet Take 81 mg by mouth 2 (two) times daily.    Historical Provider, MD  atorvastatin (LIPITOR) 20 MG tablet TAKE 1 TABLET DAILY AT     BEDTIME 09/03/13   Amy E Diona Browner, MD  AVODART 0.5 MG capsule TAKE 1 CAPSULE DAILY 04/17/13   Amy Cletis Athens, MD  carvedilol (COREG) 3.125 MG tablet TAKE 1 TABLET TWICE A DAY  WITH MEALS 08/14/14   Jolaine Artist, MD  Cholecalciferol (TH VITAMIN D3) 2000 UNITS CAPS Take 2,000 Units by mouth daily.     Historical Provider, MD  colchicine (COLCRYS) 0.6 MG tablet 2 tab po x 1 then repeat in 1 hour , then continue 1 tab daily until uric acid at goal with allopurinol. 04/17/14   Amy Cletis Athens, MD  cyclobenzaprine (FLEXERIL) 10 MG tablet TAKE 1 TABLET (10 MG TOTAL) BY MOUTH AT BEDTIME. 06/12/14   Amy E Diona Browner, MD  DEXILANT 60 MG capsule TAKE 1 CAPSULE DAILY 06/05/14   Amy Cletis Athens, MD  eplerenone (INSPRA) 25 MG tablet Take 0.5 tablets (12.5 mg total) by mouth daily. 08/14/14   Jolaine Artist, MD  furosemide (LASIX) 40 MG tablet TAKE 1 TABLET DAILY 08/11/14   Amy Cletis Athens, MD  gabapentin (NEURONTIN) 300 MG capsule TAKE 1 CAPSULE 4 TIMES     DAILY 06/08/14   Amy Cletis Athens, MD  glucose blood (ACCU-CHEK AVIVA PLUS) test strip USE AND DISCARD 1 TEST     STRIP DAILY AND AS NEEDED  FOR DIABETES.DX 250.00 06/16/13   Amy Cletis Athens, MD  guaiFENesin (MUCINEX) 600 MG 12 hr tablet Take 1,200 mg by mouth 2 (two) times daily as needed for cough.     Historical Provider, MD  HYDROcodone-acetaminophen (NORCO/VICODIN) 5-325 MG per tablet Take 1 tablet by mouth every 6 (six) hours as needed for moderate pain. 06/22/14   Amy E Diona Browner, MD  KLOR-CON M20 20 MEQ tablet TAKE 1  TABLET TWICE A DAY 08/11/14   Amy E Diona Browner, MD  lisinopril (PRINIVIL,ZESTRIL) 2.5 MG tablet Take 1 tablet (2.5 mg total) by mouth daily. 08/14/14   Jolaine Artist, MD  tamsulosin (FLOMAX) 0.4 MG CAPS capsule Take 0.4 mg by mouth daily after supper.    Historical Provider, MD  tiotropium (SPIRIVA HANDIHALER) 18 MCG inhalation capsule INHALE THE CONTENTS OF ONE CAPSULE DAILY IN THE       MORNING VIA HANDIHALER     DEVICE 12/28/13   Amy E Bedsole, MD   BP 138/64 mmHg  Pulse 71  Temp(Src) 98 F (36.7 C) (Oral)  Resp 12  SpO2 98% Physical Exam  Constitutional: He is oriented to person, place, and time. He appears well-developed and well-nourished.  HENT:  Head: Normocephalic and atraumatic.  Right Ear: External ear normal.  Left Ear: External ear normal.  Mouth/Throat: Oropharynx is clear and moist.  Eyes: Pupils are equal, round, and reactive to light.  Neck: Trachea normal and phonation normal. Neck supple. Normal carotid pulses and no JVD present. Muscular tenderness present. No tracheal tenderness and no spinous process tenderness present. Carotid bruit is not present. No rigidity. Decreased range of motion present. No tracheal deviation, no edema and no erythema present. No thyroid mass and no thyromegaly present.  Decreased ROM due to pain, neck supple with no focal tenderness swelling or asymmetry.   Cardiovascular: Normal rate, regular rhythm, normal heart sounds and  intact distal pulses.  Exam reveals no gallop and no friction rub.   No murmur heard. Pulmonary/Chest: Effort normal and breath sounds normal. No stridor. No respiratory distress. He has no wheezes. He has no rales. He exhibits no tenderness.  Pacemaker left chest  Abdominal: Soft. There is no tenderness.  Musculoskeletal:  Full passive ROM shoulders bilateral, distal pulses equal bilateral. Sensation strength intact  Lymphadenopathy:    He has no cervical adenopathy.  Neurological: He is alert and oriented to person,  place, and time. No cranial nerve deficit or sensory deficit. Coordination normal. GCS eye subscore is 4. GCS verbal subscore is 5. GCS motor subscore is 6.  Strength 4/5   Skin: Skin is warm and dry.  Psychiatric: He has a normal mood and affect. His behavior is normal. Judgment and thought content normal.  Nursing note and vitals reviewed.   ED Course  Procedures (including critical care time) Labs Review Labs Reviewed - No data to display  Imaging Review Ct Cervical Spine Wo Contrast  08/26/2014   CLINICAL DATA:  Neck pain radiating down left arm beginning last night. No injury. Previous cervical fusion 08/08/09.  EXAM: CT CERVICAL SPINE WITHOUT CONTRAST  TECHNIQUE: Multidetector CT imaging of the cervical spine was performed without intravenous contrast. Multiplanar CT image reconstructions were also generated.  COMPARISON:  CT 03/04/2010 and plain films 11/10/2010  FINDINGS: Examination demonstrates patient's anterior fusion hardware at the C3-4 level with tall intervertebral disc spacer at the intervening disc space unchanged. There is fusion of the left C3-4 facets. There is moderate spondylosis of the cervical spine. There is moderate disc space narrowing at the C5-6 level unchanged. There is a very subtle 1-2 mm posterior subluxation of C5 with respect to C6 unchanged. Prevertebral soft tissues are within normal. Moderate degenerative changes at the C1-2 articulation with subchondral cysts within the C2 vertebral body and base of dens. The slightly more pronounced pannus formation posterior to the dens. No acute fracture. Moderate uncovertebral joint spurring and facet arthropathy is present. Bilateral neural foraminal narrowing is present at multiple levels due to adjacent bony spurring. Remainder the exam is unremarkable.  IMPRESSION: No acute findings.  Moderate spondylosis of the cervical spine with moderate disc disease at the C5-6 level unchanged. Anterior fusion hardware intact with  intervertebral disc spacer at the C3-4 level.  Multilevel bilateral neural foraminal narrowing due to adjacent bony spurring.  Mild degenerative change of the C1-2 articulation with significant subchondral cyst formation of C2 with progression compared to 08-08-09. Slightly more pronounced pannus formation posterior to the dens.   Electronically Signed   By: Marin Olp M.D.   On: 08/26/2014 15:27     EKG Interpretation VENTRICULAR PACED RHYTHM Ventricular premature complex When compared with ECG of 02/08/2013 No significant change was found Confirmed by [facility] MD, Nunzio Cory [phone]) on 08/26/2014 1:38:27 PM      MDM   Final diagnoses:  Neck pain    Imaging: CT Spine without contrast  A/P: Pt presents with acute on chronic neck pain that is improving and now managed with oral pain medication. Family's concern was for hardware placement. Today's presentation is similar in nature to his chronic neck pain but pt and family admit that he spent the weekend outside trimming bushes with a significant increase in his normal activities. At the time of my evaluation patient was asymptomatic at rest, pain with neck movements in all directions. Pt had no changes in cognitive function, carotid pulse equal bilateral, no bruits  or thrills, no concern for carotid artery dissection. No signs of infection. CT scan showed no acutely concerning changes. Pt was instructed to follow-up with PCP and surgeon if symptoms continue to persist. He was instructed to return to the ED immediately if new or worsening signs or symptoms presented. Pt and his daughter verbalized their understanding to today's plan and assured their follow-up evaluation. Pt was instructed to continue home pain therapy.     Okey Regal, PA-C 08/27/14 [place], DO 08/29/14 1112

## 2014-08-26 NOTE — ED Notes (Addendum)
Per EMS, Patient started to have neck pain with movement radiating down his left arm last night. Patient's family called to have patient seen due to patient's continuation of the pain this morning. Patient is Alert and Oriented x4. Patient has HX of COPD and pacemaker. Vitals per EMS:  78 Irregular, 99% on 4 L, 130/71.

## 2014-08-28 ENCOUNTER — Ambulatory Visit (INDEPENDENT_AMBULATORY_CARE_PROVIDER_SITE_OTHER): Payer: Medicare Other | Admitting: Family Medicine

## 2014-08-28 ENCOUNTER — Encounter: Payer: Self-pay | Admitting: Family Medicine

## 2014-08-28 VITALS — BP 130/67 | HR 70 | Temp 97.7°F | Ht 67.0 in

## 2014-08-28 DIAGNOSIS — G8929 Other chronic pain: Secondary | ICD-10-CM

## 2014-08-28 DIAGNOSIS — M542 Cervicalgia: Secondary | ICD-10-CM

## 2014-08-28 DIAGNOSIS — R410 Disorientation, unspecified: Secondary | ICD-10-CM | POA: Diagnosis not present

## 2014-08-28 LAB — POCT URINALYSIS DIPSTICK
BILIRUBIN UA: NEGATIVE
Glucose, UA: NEGATIVE
Ketones, UA: NEGATIVE
Leukocytes, UA: NEGATIVE
NITRITE UA: NEGATIVE
Protein, UA: NEGATIVE
RBC UA: NEGATIVE
Spec Grav, UA: 1.025
Urobilinogen, UA: 1
pH, UA: 6

## 2014-08-28 LAB — CBC WITH DIFFERENTIAL/PLATELET
Basophils Absolute: 0.1 10*3/uL (ref 0.0–0.1)
Basophils Relative: 0.3 % (ref 0.0–3.0)
EOS ABS: 0.1 10*3/uL (ref 0.0–0.7)
Eosinophils Relative: 0.9 % (ref 0.0–5.0)
HEMATOCRIT: 34.9 % — AB (ref 39.0–52.0)
HEMOGLOBIN: 11.4 g/dL — AB (ref 13.0–17.0)
Lymphocytes Relative: 14.7 % (ref 12.0–46.0)
Lymphs Abs: 2.2 10*3/uL (ref 0.7–4.0)
MCHC: 32.6 g/dL (ref 30.0–36.0)
MCV: 89 fl (ref 78.0–100.0)
MONO ABS: 2.1 10*3/uL — AB (ref 0.1–1.0)
Monocytes Relative: 14.5 % — ABNORMAL HIGH (ref 3.0–12.0)
NEUTROS PCT: 69.6 % (ref 43.0–77.0)
Neutro Abs: 10.2 10*3/uL — ABNORMAL HIGH (ref 1.4–7.7)
PLATELETS: 238 10*3/uL (ref 150.0–400.0)
RBC: 3.92 Mil/uL — ABNORMAL LOW (ref 4.22–5.81)
RDW: 16.6 % — AB (ref 11.5–15.5)
WBC: 14.6 10*3/uL — ABNORMAL HIGH (ref 4.0–10.5)

## 2014-08-28 LAB — COMPREHENSIVE METABOLIC PANEL
ALBUMIN: 4 g/dL (ref 3.5–5.2)
ALT: 66 U/L — AB (ref 0–53)
AST: 74 U/L — ABNORMAL HIGH (ref 0–37)
Alkaline Phosphatase: 115 U/L (ref 39–117)
BILIRUBIN TOTAL: 0.9 mg/dL (ref 0.2–1.2)
BUN: 19 mg/dL (ref 6–23)
CALCIUM: 9.4 mg/dL (ref 8.4–10.5)
CO2: 28 mEq/L (ref 19–32)
Chloride: 103 mEq/L (ref 96–112)
Creatinine, Ser: 0.85 mg/dL (ref 0.40–1.50)
GFR: 91.76 mL/min (ref >60.00)
Glucose, Bld: 107 mg/dL — ABNORMAL HIGH (ref 70–99)
Potassium: 3.9 mEq/L (ref 3.5–5.1)
Sodium: 137 mEq/L (ref 135–145)
Total Protein: 7 g/dL (ref 6.0–8.3)

## 2014-08-28 LAB — TSH: TSH: 1.15 u[IU]/mL (ref 0.35–4.50)

## 2014-08-28 MED ORDER — TRAMADOL HCL 50 MG PO TABS: 100.0000 mg | ORAL_TABLET | Freq: Four times a day (QID) | ORAL | Status: DC | PRN

## 2014-08-28 NOTE — Progress Notes (Signed)
Pre visit review using our clinic review tool, if applicable. No additional management support is needed unless otherwise documented below in the visit note. 

## 2014-08-28 NOTE — Patient Instructions (Addendum)
Stop hydrocodone. Start 100 mg tramadol every 6 hours for pain. Call if pain not controlled. Stop at lab on way out.  Stop for referral on way out.

## 2014-08-28 NOTE — Progress Notes (Addendum)
Subjective:    Patient ID: [name], male    DOB: November 11, 1930, 81 y.o.   MRN: [id number]  HPI  81 year old male with complicated medical history well known to me presents following ED visit for  neck pain   He was seen on 5/15. In 2013 his had a spinal fusion surgery that was complicated by hematoma that required second emergent evacuation and fusion. Pt states since then he has had significant weakness and decreased sensation to his upper and lower extremities. He note d in ER  that  after a day of trimming bushes and moving items he experienced an increase in his chronic posterior neck pain with radiation down into his left arm. At that time the pain was unbearable and unmanageable with his oral pain medications.    CT C spine negative. No labs , no urine done. Sent home with recs for follow up. At that time no confusion per note, but daughters states it had started given taking narcotics.  He cannot do a MRI given pacemaker and defibrillator.  He comes to clinic to day with his son in law and daughter..  The report he is now confused continuously. He has always been confused when using hydrocodone. Usually only using it prn, more often using muscle relaxant. Daughter is giving him the hydrocodone every 6 hours. Daughter has gotten a neck brace. He last took a dose of hydrocodone this AM at 8 AM.  He has gotten worse with pain and confusion since being in hospital. If he moves a t all he has pain. Pain on left,side of neck radiates to left neck. Worse with movement. He has not been eating very well,  Not peeing as well. No dysuria, no abdominal pain. He is drinking water, some.  No fever.  No new weakness,  No new fever.  New numbness and tingling in arms bilateral, but this happens off and on as well.  Neurosurgeon Dr. Ronnald Ramp. No a great surgical candidate.  He has never tried higher dose tramadol, only 50 mg which did not help.    Review of Systems  Constitutional:  Negative for fever and fatigue.  HENT: Negative for ear pain.   Eyes: Negative for pain.  Respiratory: Negative for cough, shortness of breath and wheezing.   Cardiovascular: Positive for leg swelling. Negative for chest pain and palpitations.  Gastrointestinal: Negative for abdominal pain and blood in stool.  Genitourinary: Negative for dysuria.  Skin: Positive for rash.  Psychiatric/Behavioral: Positive for hallucinations, confusion and sleep disturbance. The patient is not nervous/anxious.        Objective:   Physical Exam  Constitutional: Vital signs are normal. He appears well-developed and well-nourished. He appears lethargic. He is cooperative. He appears ill. No distress.  Elderly male in NAD  HENT:  Head: Normocephalic.  Right Ear: Hearing normal.  Left Ear: Hearing normal.  Nose: Nose normal.  Mouth/Throat: Oropharynx is clear and moist and mucous membranes are normal.  Neck: Trachea normal. Carotid bruit is not present. No thyroid mass and no thyromegaly present.  Cardiovascular: Normal rate and normal pulses.  An irregularly irregular rhythm present. Exam reveals no gallop and no friction rub.   No murmur heard. No peripheral edema  Pulmonary/Chest: Effort normal and breath sounds normal. No respiratory distress.  Abdominal: Soft. Normal appearance and bowel sounds are normal. There is no tenderness.  Musculoskeletal:       Left shoulder: He exhibits normal range of motion, no tenderness and  no bony tenderness.       Cervical back: He exhibits decreased range of motion and tenderness. He exhibits no bony tenderness, no swelling and no edema.  In wheelchair wearing cervical collar   unable to move head to perform Spurling's  Neurological: He appears lethargic.  Skin: Skin is warm, dry and intact. No rash noted.  Psychiatric: Thought content normal. His affect is blunt. His speech is tangential. He is slowed. Cognition and memory are impaired. He expresses impulsivity  and inappropriate judgment. He exhibits abnormal recent memory and abnormal remote memory.  Pt is confused , talking about things that are not relevant to conversation           Assessment & Plan:   Altered Mental Status: Likely due to narcotics.  Will also eval for secondary cause with cbc, CMET and TSH. UA shows no evidence of UTI.  Neck pain, acute on chronic: new MSK injury... Neck  Muscle strain  versus nerve irritation.  CT of cervical spine in ER showed significant degenerative changes, with multilevel foraminal narrowing. Pt is poor historian currently and it is difficult to pinpoint pain. Unable to perform MRI given pacemaker. Will change pain med to tramadol an continue muscle relaxant.  If pain not controlled, can consider prednisone course. Contnue cervical collar  Will also refer to PMR for consideration of steroid injections vs pain medication management. Do not recommend returning to neurosurgery as pt is not a good surgical candidate.

## 2014-08-29 ENCOUNTER — Other Ambulatory Visit (HOSPITAL_COMMUNITY): Payer: Self-pay | Admitting: *Deleted

## 2014-08-29 MED ORDER — PANTOPRAZOLE SODIUM 40 MG PO TBEC: 40.0000 mg | DELAYED_RELEASE_TABLET | Freq: Every day | ORAL | Status: DC

## 2014-08-29 MED ORDER — TIOTROPIUM BROMIDE MONOHYDRATE 18 MCG IN CAPS: ORAL_CAPSULE | RESPIRATORY_TRACT | Status: DC

## 2014-08-29 NOTE — Addendum Note (Signed)
Addended by: Carter Kitten on: 08/29/2014 09:02 AM   Modules accepted: Orders

## 2014-08-29 NOTE — Addendum Note (Signed)
Addended by: Carter Kitten on: 08/29/2014 09:57 AM   Modules accepted: Orders

## 2014-08-30 ENCOUNTER — Ambulatory Visit (INDEPENDENT_AMBULATORY_CARE_PROVIDER_SITE_OTHER): Payer: Medicare Other | Admitting: *Deleted

## 2014-08-30 ENCOUNTER — Observation Stay (HOSPITAL_COMMUNITY): Payer: Medicare Other

## 2014-08-30 ENCOUNTER — Emergency Department (HOSPITAL_COMMUNITY): Payer: Medicare Other

## 2014-08-30 ENCOUNTER — Telehealth: Payer: Self-pay

## 2014-08-30 ENCOUNTER — Telehealth: Payer: Self-pay | Admitting: Cardiology

## 2014-08-30 ENCOUNTER — Observation Stay (HOSPITAL_COMMUNITY)
Admission: EM | Admit: 2014-08-30 | Discharge: 2014-08-31 | Disposition: A | Discharge status: Home / Self Care | Payer: Medicare Other | Attending: Internal Medicine | Admitting: Internal Medicine

## 2014-08-30 ENCOUNTER — Encounter (HOSPITAL_COMMUNITY): Payer: Self-pay | Admitting: Emergency Medicine

## 2014-08-30 DIAGNOSIS — M25442 Effusion, left hand: Secondary | ICD-10-CM | POA: Diagnosis present

## 2014-08-30 DIAGNOSIS — M10042 Idiopathic gout, left hand: Secondary | ICD-10-CM | POA: Insufficient documentation

## 2014-08-30 DIAGNOSIS — R945 Abnormal results of liver function studies: Secondary | ICD-10-CM

## 2014-08-30 DIAGNOSIS — G9340 Encephalopathy, unspecified: Secondary | ICD-10-CM | POA: Diagnosis not present

## 2014-08-30 DIAGNOSIS — I5022 Chronic systolic (congestive) heart failure: Secondary | ICD-10-CM

## 2014-08-30 DIAGNOSIS — I4891 Unspecified atrial fibrillation: Secondary | ICD-10-CM | POA: Diagnosis present

## 2014-08-30 DIAGNOSIS — I482 Chronic atrial fibrillation: Secondary | ICD-10-CM | POA: Insufficient documentation

## 2014-08-30 DIAGNOSIS — M109 Gout, unspecified: Secondary | ICD-10-CM

## 2014-08-30 DIAGNOSIS — E785 Hyperlipidemia, unspecified: Secondary | ICD-10-CM | POA: Diagnosis not present

## 2014-08-30 DIAGNOSIS — J449 Chronic obstructive pulmonary disease, unspecified: Secondary | ICD-10-CM | POA: Diagnosis not present

## 2014-08-30 DIAGNOSIS — R7989 Other specified abnormal findings of blood chemistry: Secondary | ICD-10-CM | POA: Insufficient documentation

## 2014-08-30 DIAGNOSIS — Z950 Presence of cardiac pacemaker: Secondary | ICD-10-CM | POA: Diagnosis not present

## 2014-08-30 DIAGNOSIS — E1140 Type 2 diabetes mellitus with diabetic neuropathy, unspecified: Secondary | ICD-10-CM

## 2014-08-30 DIAGNOSIS — S0990XA Unspecified injury of head, initial encounter: Secondary | ICD-10-CM | POA: Diagnosis not present

## 2014-08-30 DIAGNOSIS — I2510 Atherosclerotic heart disease of native coronary artery without angina pectoris: Secondary | ICD-10-CM | POA: Insufficient documentation

## 2014-08-30 DIAGNOSIS — I10 Essential (primary) hypertension: Secondary | ICD-10-CM | POA: Insufficient documentation

## 2014-08-30 DIAGNOSIS — R740 Nonspecific elevation of levels of transaminase and lactic acid dehydrogenase [LDH]: Secondary | ICD-10-CM

## 2014-08-30 DIAGNOSIS — K219 Gastro-esophageal reflux disease without esophagitis: Secondary | ICD-10-CM | POA: Diagnosis not present

## 2014-08-30 DIAGNOSIS — K7689 Other specified diseases of liver: Secondary | ICD-10-CM | POA: Diagnosis not present

## 2014-08-30 DIAGNOSIS — S6392XA Sprain of unspecified part of left wrist and hand, initial encounter: Secondary | ICD-10-CM | POA: Diagnosis not present

## 2014-08-30 DIAGNOSIS — R7401 Elevation of levels of liver transaminase levels: Secondary | ICD-10-CM | POA: Diagnosis present

## 2014-08-30 DIAGNOSIS — I4901 Ventricular fibrillation: Secondary | ICD-10-CM | POA: Insufficient documentation

## 2014-08-30 DIAGNOSIS — I429 Cardiomyopathy, unspecified: Secondary | ICD-10-CM | POA: Diagnosis not present

## 2014-08-30 DIAGNOSIS — R4182 Altered mental status, unspecified: Secondary | ICD-10-CM

## 2014-08-30 DIAGNOSIS — F17210 Nicotine dependence, cigarettes, uncomplicated: Secondary | ICD-10-CM | POA: Diagnosis not present

## 2014-08-30 DIAGNOSIS — I5032 Chronic diastolic (congestive) heart failure: Secondary | ICD-10-CM | POA: Insufficient documentation

## 2014-08-30 DIAGNOSIS — N400 Enlarged prostate without lower urinary tract symptoms: Secondary | ICD-10-CM | POA: Insufficient documentation

## 2014-08-30 DIAGNOSIS — M6282 Rhabdomyolysis: Secondary | ICD-10-CM | POA: Insufficient documentation

## 2014-08-30 DIAGNOSIS — M79643 Pain in unspecified hand: Secondary | ICD-10-CM

## 2014-08-30 DIAGNOSIS — I428 Other cardiomyopathies: Secondary | ICD-10-CM

## 2014-08-30 LAB — COMPREHENSIVE METABOLIC PANEL
ALBUMIN: 3.2 g/dL — AB (ref 3.5–5.0)
ALK PHOS: 127 U/L — AB (ref 38–126)
ALT: 121 U/L — AB (ref 17–63)
AST: 80 U/L — AB (ref 15–41)
Anion gap: 12 (ref 5–15)
BILIRUBIN TOTAL: 0.8 mg/dL (ref 0.3–1.2)
BUN: 16 mg/dL (ref 6–20)
CO2: 22 mmol/L (ref 22–32)
Calcium: 9 mg/dL (ref 8.9–10.3)
Chloride: 102 mmol/L (ref 101–111)
Creatinine, Ser: 0.86 mg/dL (ref 0.61–1.24)
GFR calc Af Amer: >60 mL/min (ref >60)
GFR calc non Af Amer: >60 mL/min (ref >60)
Glucose, Bld: 123 mg/dL — ABNORMAL HIGH (ref 65–99)
POTASSIUM: 3.8 mmol/L (ref 3.5–5.1)
SODIUM: 136 mmol/L (ref 135–145)
Total Protein: 6.8 g/dL (ref 6.5–8.1)

## 2014-08-30 LAB — CBC WITH DIFFERENTIAL/PLATELET
Basophils Absolute: 0 10*3/uL (ref 0.0–0.1)
Basophils Relative: 0 % (ref 0–1)
EOS PCT: 1 % (ref 0–5)
Eosinophils Absolute: 0.1 10*3/uL (ref 0.0–0.7)
HCT: 32.7 % — ABNORMAL LOW (ref 39.0–52.0)
HEMOGLOBIN: 10.4 g/dL — AB (ref 13.0–17.0)
LYMPHS ABS: 2.4 10*3/uL (ref 0.7–4.0)
LYMPHS PCT: 19 % (ref 12–46)
MCH: 28.3 pg (ref 26.0–34.0)
MCHC: 31.8 g/dL (ref 30.0–36.0)
MCV: 89.1 fL (ref 78.0–100.0)
MONOS PCT: 15 % — AB (ref 3–12)
Monocytes Absolute: 1.9 10*3/uL — ABNORMAL HIGH (ref 0.1–1.0)
Neutro Abs: 7.9 10*3/uL — ABNORMAL HIGH (ref 1.7–7.7)
Neutrophils Relative %: 65 % (ref 43–77)
PLATELETS: 241 10*3/uL (ref 150–400)
RBC: 3.67 MIL/uL — AB (ref 4.22–5.81)
RDW: 15.3 % (ref 11.5–15.5)
WBC: 12.3 10*3/uL — AB (ref 4.0–10.5)

## 2014-08-30 LAB — CREATININE, SERUM
CREATININE: 0.76 mg/dL (ref 0.61–1.24)
GFR calc Af Amer: >60 mL/min (ref >60)
GFR calc non Af Amer: >60 mL/min (ref >60)

## 2014-08-30 LAB — I-STAT CG4 LACTIC ACID, ED: LACTIC ACID, VENOUS: 0.66 mmol/L (ref 0.5–2.0)

## 2014-08-30 LAB — GLUCOSE, CAPILLARY: GLUCOSE-CAPILLARY: 87 mg/dL (ref 65–99)

## 2014-08-30 LAB — MAGNESIUM: MAGNESIUM: 2 mg/dL (ref 1.7–2.4)

## 2014-08-30 LAB — AMMONIA: Ammonia: 25 umol/L (ref 9–35)

## 2014-08-30 LAB — PHOSPHORUS: Phosphorus: 3.5 mg/dL (ref 2.5–4.6)

## 2014-08-30 LAB — VITAMIN B12: VITAMIN B 12: 1850 pg/mL — AB (ref 180–914)

## 2014-08-30 LAB — ACETAMINOPHEN LEVEL

## 2014-08-30 MED ORDER — COLCHICINE 0.6 MG PO TABS
0.6000 mg | ORAL_TABLET | Freq: Two times a day (BID) | ORAL | Status: DC
  Administered 2014-08-30: 0.6 mg via ORAL
  Filled 2014-08-30 (×3): qty 1

## 2014-08-30 MED ORDER — TAMSULOSIN HCL 0.4 MG PO CAPS
0.4000 mg | ORAL_CAPSULE | Freq: Every day | ORAL | Status: DC
  Filled 2014-08-30: qty 1

## 2014-08-30 MED ORDER — ACETAMINOPHEN 325 MG PO TABS
650.0000 mg | ORAL_TABLET | Freq: Four times a day (QID) | ORAL | Status: DC | PRN
Start: 2014-08-30 — End: 2014-08-31

## 2014-08-30 MED ORDER — GABAPENTIN 100 MG PO CAPS
200.0000 mg | ORAL_CAPSULE | Freq: Three times a day (TID) | ORAL | Status: DC
  Administered 2014-08-31: 200 mg via ORAL
  Filled 2014-08-30 (×4): qty 2

## 2014-08-30 MED ORDER — ACETAMINOPHEN 650 MG RE SUPP
650.0000 mg | Freq: Four times a day (QID) | RECTAL | Status: DC | PRN
Start: 2014-08-30 — End: 2014-08-31

## 2014-08-30 MED ORDER — PANTOPRAZOLE SODIUM 40 MG PO TBEC
40.0000 mg | DELAYED_RELEASE_TABLET | Freq: Two times a day (BID) | ORAL | Status: DC
  Administered 2014-08-31: 40 mg via ORAL
  Filled 2014-08-30: qty 1

## 2014-08-30 MED ORDER — POTASSIUM CHLORIDE CRYS ER 20 MEQ PO TBCR
20.0000 meq | EXTENDED_RELEASE_TABLET | Freq: Two times a day (BID) | ORAL | Status: DC
  Administered 2014-08-31: 20 meq via ORAL
  Filled 2014-08-30 (×3): qty 1

## 2014-08-30 MED ORDER — HEPARIN SODIUM (PORCINE) 5000 UNIT/ML IJ SOLN
5000.0000 [IU] | Freq: Three times a day (TID) | INTRAMUSCULAR | Status: DC
  Administered 2014-08-31 (×2): 5000 [IU] via SUBCUTANEOUS
  Filled 2014-08-30 (×3): qty 1

## 2014-08-30 MED ORDER — ALLOPURINOL 100 MG PO TABS
100.0000 mg | ORAL_TABLET | Freq: Every day | ORAL | Status: DC
  Administered 2014-08-31: 100 mg via ORAL
  Filled 2014-08-30 (×2): qty 1

## 2014-08-30 MED ORDER — ONDANSETRON HCL 4 MG/2ML IJ SOLN: 4.0000 mg | Freq: Four times a day (QID) | INTRAMUSCULAR | Status: DC | PRN

## 2014-08-30 MED ORDER — LISINOPRIL 2.5 MG PO TABS
2.5000 mg | ORAL_TABLET | Freq: Every day | ORAL | Status: DC
  Administered 2014-08-31: 2.5 mg via ORAL
  Filled 2014-08-30 (×2): qty 1

## 2014-08-30 MED ORDER — SODIUM CHLORIDE 0.9 % IV SOLN
INTRAVENOUS | Status: AC
  Administered 2014-08-30: 23:00:00 via INTRAVENOUS

## 2014-08-30 MED ORDER — DUTASTERIDE 0.5 MG PO CAPS
0.5000 mg | ORAL_CAPSULE | Freq: Every day | ORAL | Status: DC
  Administered 2014-08-31: 0.5 mg via ORAL
  Filled 2014-08-30 (×2): qty 1

## 2014-08-30 MED ORDER — TIOTROPIUM BROMIDE MONOHYDRATE 18 MCG IN CAPS
18.0000 ug | ORAL_CAPSULE | Freq: Every day | RESPIRATORY_TRACT | Status: DC
  Administered 2014-08-31: 18 ug via RESPIRATORY_TRACT
  Filled 2014-08-30: qty 5

## 2014-08-30 MED ORDER — ALBUTEROL SULFATE (2.5 MG/3ML) 0.083% IN NEBU: 2.0000 mL | INHALATION_SOLUTION | Freq: Four times a day (QID) | RESPIRATORY_TRACT | Status: DC | PRN

## 2014-08-30 MED ORDER — TRAMADOL HCL 50 MG PO TABS: 100.0000 mg | ORAL_TABLET | Freq: Three times a day (TID) | ORAL | Status: DC | PRN

## 2014-08-30 MED ORDER — GUAIFENESIN ER 600 MG PO TB12
1200.0000 mg | ORAL_TABLET | Freq: Two times a day (BID) | ORAL | Status: DC | PRN
Start: 2014-08-30 — End: 2014-08-31
  Filled 2014-08-30: qty 2

## 2014-08-30 MED ORDER — INSULIN ASPART 100 UNIT/ML ~~LOC~~ SOLN
0.0000 [IU] | Freq: Three times a day (TID) | SUBCUTANEOUS | Status: DC
Start: 2014-08-31 — End: 2014-08-31
  Administered 2014-08-31: 3 [IU] via SUBCUTANEOUS

## 2014-08-30 MED ORDER — PREDNISONE 20 MG PO TABS
20.0000 mg | ORAL_TABLET | Freq: Every day | ORAL | Status: DC
  Administered 2014-08-31: 20 mg via ORAL
  Filled 2014-08-30 (×2): qty 1

## 2014-08-30 MED ORDER — CARVEDILOL 3.125 MG PO TABS
3.1250 mg | ORAL_TABLET | Freq: Two times a day (BID) | ORAL | Status: DC
  Administered 2014-08-31: 3.125 mg via ORAL
  Filled 2014-08-30 (×3): qty 1

## 2014-08-30 MED ORDER — ONDANSETRON HCL 4 MG PO TABS: 4.0000 mg | ORAL_TABLET | Freq: Four times a day (QID) | ORAL | Status: DC | PRN

## 2014-08-30 MED ORDER — SPIRONOLACTONE 12.5 MG HALF TABLET
12.5000 mg | ORAL_TABLET | Freq: Every day | ORAL | Status: DC
  Administered 2014-08-31: 12.5 mg via ORAL
  Filled 2014-08-30 (×2): qty 1

## 2014-08-30 MED ORDER — FUROSEMIDE 40 MG PO TABS
40.0000 mg | ORAL_TABLET | Freq: Every day | ORAL | Status: DC
  Administered 2014-08-31: 40 mg via ORAL
  Filled 2014-08-30 (×2): qty 1

## 2014-08-30 MED ORDER — ASPIRIN 81 MG PO CHEW
81.0000 mg | CHEWABLE_TABLET | Freq: Two times a day (BID) | ORAL | Status: DC
  Administered 2014-08-31: 81 mg via ORAL
  Filled 2014-08-30 (×4): qty 1

## 2014-08-30 NOTE — Progress Notes (Signed)
Remote ICD transmission.   

## 2014-08-30 NOTE — ED Notes (Signed)
Unable to I & O to obtain urine sample

## 2014-08-30 NOTE — ED Notes (Signed)
Pt unable to void at this time. 

## 2014-08-30 NOTE — ED Notes (Signed)
Family at bedside. 

## 2014-08-30 NOTE — ED Notes (Signed)
Pt not in room, at CT, family at bedside speaking with MD.

## 2014-08-30 NOTE — Telephone Encounter (Signed)
Spoke with pt and reminded pt of remote transmission that is due today. Pt verbalized understanding.   

## 2014-08-30 NOTE — H&P (Signed)
[facility] History and Physical  TRENTAN TRIPPE PYP:[id number] DOB: [birth year] DOA: 08/30/2014  Referring physician: Dr. Audie Pinto PCP: Eliezer Lofts, MD   Chief Complaint: Altered mental status, abnormal LFTs, acute gouty arthritis attack  HPI: [name] is a 81 y.o. male with a significant past medical history significant for COPD, hypertension, diabetes type 2 with neuropathy, chronic atrial fibrillation with ventricular fibrillation (status post pacemaker; not on anticoagulation), hyperlipidemia, gout, GERD, CHF and chronic neck pain; who was recently seen in the emergency department secondary to acute on chronic cervical pain after increased physical activity at home, and the moment patient had images studies done demonstrating no acute process in his neck and was discharged home on hydrocodone with follow-up at his PCP office. After patient started taking medications he had increased confusion and changes in mentation. At the time that he was seen by his PCP, further workup for his altered mental status was done with urinalysis TSH and the patient was taken off hydrocortisone. Despite not taking the narcotics patient continued experiencing fluctuation in his mental status and per patient's daughter was difficult to handle at home recently she brought the patient back to the emergency department. At the moment of my evaluation wishes roughly 48 hours now after stopping the medications patient is alert, awake and oriented 3 and without much complaints other than that left hand swelling and pain (appears to be gout flare). His workup in the ED demonstrated a normal CT scan of the head, mild elevation of his WBCs and some transaminitis. Per family patient since his last visit around the 13 has not been eating and drinking properly at home and was found to be slightly dehydrated on physical exam. [facility] has been called to admit the patient for observation given fluctuation of his  mentation and to assess further his elevated liver function test.  Of note, patient denies chest pain, cough, shortness of breath, fever, dysuria, abdominal pain, nausea, vomiting, melena/hematochezia, headache, blurred vision or any other acute complaints.    Review of Systems:  Negative except as otherwise mentioned on history of present illness.  Past Medical History  Diagnosis Date  . COPD (chronic obstructive pulmonary disease)     GOLD II; Spirometry 07/10/2008 >FEV1 1.46 56% predicted, ratio of 66%  . Anemia     iron deficiency anemia with previous severe GI bleed   . Hypertension   . Hyperlipidemia   . Left bundle branch block   . Prostatic hypertrophy 09-11-97    Benign  . Obesity   . CHF (congestive heart failure)     secondary to nonischemic cardiomyopathy; ECHO 10/07 EF 20-25%, mod to severe MR; ECHO 1/10 EF 55-60%, mild MR; ECHO 2/11 55-65%, grade 1 diast dysfxn, miod to mod LAE, mild TR;    S/P [employer] BiV ICD with biventricular function now turned off due to diahragmatic simulation  . CAD (coronary artery disease)     Mild, nonobstructive (LHC 1/07: mLAD 20%, pCFX 20-30%, mRCA 30%, EF 25%)  . Atrial fibrillation     s/p AV node ablation; not on coumadin due to GIB  . Cardiac arrest - ventricular fibrillation 02/2008    Aborted, shocked by ICD  . Dyspnea     exertional  . Pneumonia     01/2011  . Diabetes mellitus     type 2 NIDDM x 5-6 yrs  . GERD (gastroesophageal reflux disease)   . Cancer     prostate  . Arthritis  back and knees  . 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 Inguinal hernia     left; asymptomatic  . Chronic neck pain   . Cervical radiculopathy     left  . Torticollis    Past Surgical History  Procedure Laterality Date  . Lobectomy  01-21-98    lung  . Rotator cuff repair  1994 and 1995  . Knee surgery  1996    Left  . Cardiac catheterization  01/2006    Showed mild nonobstructive CAD  . Cardiac catheterization   11/05/2006    Right atrial pressure mean of 12, RV pressure 36/8, PA pressure 39/16 witha mean of 28, wedge pressure was 20. Fick cardiac output was 5 liters per minute, cardiac index was 2.4  . Esophagogastroduodenoscopy  09/1997    Prepylor ulcer, esoph ring, duod avm  . Colonoscopy w/ polypectomy  11/1997    Divertics, int hemms  . Mch gi bleed  02/07-02/12/2002  . Esophagogastroduodenoscopy  05/22/2002    Poss Barrett's   . Colonoscopy  06/26/2002    Multip (neg) divertics, int hemm  . Colonoscopy  07/10/2004    Poyps, divertics, int hemms  . Mch sob  10/11-10/18/2007    A fib, CHF  . Esophagogastroduodenoscopy  02/19/2006    HH No active bleeding  . Ct head limited w/o cm  10/03/2006    No acute abnmlty  . Coronary angioplasty      Min abstrut zd severe LB dystn EF 25%  . Mch x  11/08-11/03/2006    Acute blood loss, anemia, sys HF, isch cardiomyopathy  . Hosp  8/14-8/23/2008    Acute on chronis CHF IIIB NOnisch Cardiomyop EF 20-25% Mod-Sev MR  . Colonoscopy  06/19/2008    2 polyps divertics int hemms (Dr Henrene Pastor)  . Insert / replace / remove pacemaker  2007  . Eye surgery      cataract, bilateral  . Anterior cervical discectomy  02/2010  . Knee arthroscopy  1988  . Lumbar laminectomy/decompression microdiscectomy  04/29/2011    Procedure: LUMBAR LAMINECTOMY/DECOMPRESSION MICRODISCECTOMY;  Surgeon: Eustace Moore, MD;  Location: [facility];  Service: Neurosurgery;  Laterality: N/A;  Lumbar Two, Three, Four, Five Decompressive Lumbar Laminectomies  . Biv icd genertaor change out N/A 02/08/2013    Procedure: BIV ICD GENERTAOR CHANGE OUT;  Surgeon: Deboraha Sprang, MD;  Location: [facility] CATH LAB;  Service: Cardiovascular;  Laterality: N/A;   Social History:  reports that he has been smoking Cigarettes.  He has a 60 pack-year smoking history. He has never used smokeless tobacco. He reports that he drinks alcohol. He reports that he does not use illicit drugs.  No Known  Allergies  Family History  Problem Relation Age of Onset  . Heart failure Mother     CHF  . Heart failure Father     CHF  . Hypertension Sister   . Obesity Sister   . Cancer Brother     lung cancer  . Lung cancer Brother   . Liver disease Brother     ETOH  . Alcohol abuse Brother   . Cancer Brother   . COPD Brother   . Cancer Brother   . Anxiety disorder Brother   . COPD Brother   . Hypertension Sister     Prior to Admission medications   Medication Sig Start Date End Date Taking? Authorizing Provider  acetaminophen (TYLENOL) 500 MG tablet Take 500 mg by  mouth every 6 (six) hours as needed for mild pain.    Yes Historical Provider, MD  albuterol (PROAIR HFA) 108 (90 BASE) MCG/ACT inhaler Inhale 2 puffs into the lungs every 6 (six) hours as needed for wheezing or shortness of breath. 09/13/13  Yes Amy Cletis Athens, MD  allopurinol (ZYLOPRIM) 100 MG tablet Take 1 tablet (100 mg total) by mouth daily. 12/25/13  Yes Amy Cletis Athens, MD  aspirin 81 MG tablet Take 81 mg by mouth 2 (two) times daily.   Yes Historical Provider, MD  atorvastatin (LIPITOR) 20 MG tablet TAKE 1 TABLET DAILY AT     BEDTIME 09/03/13  Yes Amy E Bedsole, MD  AVODART 0.5 MG capsule TAKE 1 CAPSULE DAILY 04/17/13  Yes Amy E Bedsole, MD  carvedilol (COREG) 3.125 MG tablet TAKE 1 TABLET TWICE A DAY  WITH MEALS 08/14/14  Yes Jolaine Artist, MD  Cholecalciferol (TH VITAMIN D3) 2000 UNITS CAPS Take 2,000 Units by mouth daily.    Yes Historical Provider, MD  colchicine (COLCRYS) 0.6 MG tablet 2 tab po x 1 then repeat in 1 hour , then continue 1 tab daily until uric acid at goal with allopurinol. 04/17/14  Yes Amy Cletis Athens, MD  DEXILANT 60 MG capsule TAKE 1 CAPSULE DAILY 06/05/14  Yes Amy E Bedsole, MD  eplerenone (INSPRA) 25 MG tablet Take 0.5 tablets (12.5 mg total) by mouth daily. 08/14/14  Yes Jolaine Artist, MD  furosemide (LASIX) 40 MG tablet TAKE 1 TABLET DAILY 08/11/14  Yes Amy E Bedsole, MD  gabapentin (NEURONTIN) 300 MG  capsule TAKE 1 CAPSULE 4 TIMES     DAILY 06/08/14  Yes Amy E Bedsole, MD  guaiFENesin (MUCINEX) 600 MG 12 hr tablet Take 1,200 mg by mouth 2 (two) times daily as needed for cough.    Yes Historical Provider, MD  KLOR-CON M20 20 MEQ tablet TAKE 1 TABLET TWICE A DAY 08/11/14  Yes Amy E Bedsole, MD  lisinopril (PRINIVIL,ZESTRIL) 2.5 MG tablet Take 1 tablet (2.5 mg total) by mouth daily. 08/14/14  Yes Shaune Pascal Bensimhon, MD  pantoprazole (PROTONIX) 40 MG tablet Take 1 tablet (40 mg total) by mouth daily. 08/29/14  Yes Amy Cletis Athens, MD  tamsulosin (FLOMAX) 0.4 MG CAPS capsule Take 0.4 mg by mouth daily after supper.   Yes Historical Provider, MD  tiotropium (SPIRIVA HANDIHALER) 18 MCG inhalation capsule INHALE THE CONTENTS OF ONE CAPSULE DAILY IN THE       MORNING VIA HANDIHALER     DEVICE 08/29/14  Yes Amy Cletis Athens, MD  traMADol (ULTRAM) 50 MG tablet Take 2 tablets (100 mg total) by mouth every 6 (six) hours as needed. Patient taking differently: Take 100 mg by mouth every 6 (six) hours as needed for moderate pain.  08/28/14  Yes Amy Cletis Athens, MD   Physical Exam: Filed Vitals:   08/30/14 1600 08/30/14 1615 08/30/14 1645 08/30/14 1715  BP: 151/66 159/70 164/73 159/69  Pulse: 73 69 68 69  Temp:      TempSrc:      Resp: 17 16 18 17   SpO2: 98% 100% 97% 100%    Wt Readings from Last 3 Encounters:  06/26/14 83.689 kg (184 lb 8 oz)  06/22/14 85.049 kg (187 lb 8 oz)  04/17/14 85.957 kg (189 lb 8 oz)    General:  Appears calm and comfortable; cooperative to examination. Patient was alert, awake and oriented 3; and with main complaint of left hand pain and swelling and  even endorses that he was in the hospital in order to assess some abnormal results from his liver function. Eyes: PERRL, normal lids, irises & conjunctiva; no icterus, no nystagmus ENT: grossly normal hearing, slight dryness of his mucous membranes, no erythema, no exudates, no thrush. Neck: no LAD, masses or thyromegaly Cardiovascular:  Regular rate, no murmurs, no gallops, no rubs; no JVD appreciated on exam Respiratory: CTA bilaterally, no wheezing, scattered rhonchi no use of accessory muscles.  Normal respiratory effort. Abdomen: soft, nt,nd, positive bowel sounds, no guarding, no rebound Skin: no rash, open wounds or induration seen on limited exam Musculoskeletal: grossly normal tone, left hand joint swelling and tender to palpation/movement; no edema on his lower extremities. Psychiatric: grossly normal mood and affect, speech fluent and appropriate Neurologic: grossly non-focal. Patient was alert, awake and oriented 3; no focal motor or neurologic deficit appreciated on exam. Cranial nerve II-12 grossly intact           Labs on Admission:  Basic Metabolic Panel:  Recent Labs Lab 08/28/14 1456 08/30/14 1433  NA 137 136  K 3.9 3.8  CL 103 102  CO2 28 22  GLUCOSE 107* 123*  BUN 19 16  CREATININE 0.85 0.86  CALCIUM 9.4 9.0   Liver Function Tests:  Recent Labs Lab 08/28/14 1456 08/30/14 1433  AST 74* 80*  ALT 66* 121*  ALKPHOS 115 127*  BILITOT 0.9 0.8  PROT 7.0 6.8  ALBUMIN 4.0 3.2*    Recent Labs Lab 08/30/14 1433  AMMONIA 25   CBC:  Recent Labs Lab 08/28/14 1456 08/30/14 1433  WBC 14.6* 12.3*  NEUTROABS 10.2* 7.9*  HGB 11.4* 10.4*  HCT 34.9* 32.7*  MCV 89.0 89.1  PLT 238.0 241   Radiological Exams on Admission: Ct Head Wo Contrast  08/30/2014   CLINICAL DATA:  Fall, patient unable to provide additional history; history COPD, hypertension, CHF, coronary artery disease, diabetes mellitus, atrial fibrillation  EXAM: CT HEAD WITHOUT CONTRAST  TECHNIQUE: Contiguous axial images were obtained from the base of the skull through the vertex without intravenous contrast.  COMPARISON:  10/03/2006  FINDINGS: Generalized atrophy.  Normal ventricular morphology.  No midline shift or mass effect.  Otherwise normal appearance of brain parenchyma.  No intracranial hemorrhage, mass lesion or evidence  acute infarction.  No extra-axial fluid collections.  Atherosclerotic calcification at carotid siphons and vertebral arteries at skullbase.  Question small osteoma LEFT frontal sinus.  Visualized paranasal sinuses and mastoid air cells otherwise clear.  Bones unremarkable.  IMPRESSION: No acute intracranial abnormalities.   Electronically Signed   By: Lavonia Dana M.D.   On: 08/30/2014 16:17   Dg Hand Complete Left  08/30/2014   CLINICAL DATA:  Twitching mat trace, now with hand sprain. Hallucinations.  EXAM: LEFT HAND - COMPLETE 3+ VIEW  COMPARISON:  None.  FINDINGS: No fracture or dislocation. Mild-to-moderate diffuse degenerative change, most conspicuously involving the second and third MCP joints with joint space loss, subchondral sclerosis and osteophytosis. A tiny ossicles noted about the first MCP joint, likely the sequela of prior/remote avulsive injury. A minimal amount of chondrocalcinosis is noted within the radiocarpal joint. Regional soft tissues appear otherwise normal. No radiopaque foreign body.  IMPRESSION: 1. No acute findings. 2. Mild-to-moderate diffuse degenerative change of the hand, most conspicuously involving the second and third MCP joints. 3. Chondrocalcinosis suggestive of CPPD.   Electronically Signed   By: Sandi Mariscal M.D.   On: 08/30/2014 15:22    EKG:  Ventricular paced rhythm,  no acute ischemic changes appreciated; normal axis  Assessment/Plan 1-altered mental status: Resolved already and most likely secondary to recent use of medications (muscle relaxant and narcotics) -Patient without any fevers, no signs of infection in his urine; CT head without acute intracranial abnormalities and normal TSH -Will discontinue muscle relaxants and avoid narcotics (except for mild/judicious use of tramadol) -will provide gentle hydration -check urine culture -check B12 level and CK -follow clinical response  2-Diabetes mellitus with neuropathy: Will check A1c.  -Continue  Neurontin at adjusted dose -SSI  3-COPD (chronic obstructive pulmonary disease): Currently compensated on patient no complaining of shortness of breath and no wheezing on physical exam. -Will continue home medications regimen  4-Chronic diastolic heart failure: A stable and compensated. -Low-sodium diet -Straight intake and output and daily weights -Patient will receive gentle fluid resuscitation given concerns for elevated CK level -Follow clinical response -No complaints of shortness of breath or fluid overload on physical exam  5-chronic Atrial fibrillation: Not a candidate for anticoagulation -Rate controlled -continue ASA  6-Essential hypertension, benign: Stable and well controlled. -Continue home medication regimen -Patient will be kept on heart healthy diet  7-left Acute gouty arthritis: Also with some complains of neck pain and increase fatigue/decreased mobility -Will continue culture sent twice a day and allopurinol -Short course of prednisone will be initiated -Physical therapy evaluation -Follow clinical response  8-Transaminitis: Mild and most likely secondary to presumed rhabdomyolysis with continuation of statins -Will provide fluid resuscitation overnight -Check CK level -Hold statins -Follow LFTs -Will check abdominal ultrasound.  9-ventricular fibrillation: Patient status post pacemaker which was interrogated on the day of admission. -Working properly  10-leukocytosis: Most likely secondary to demargination due to acute gouty arthritis and presumed mild rhabdomyolysis -will provide IVF's -follow trend -expecting slight increase in level with use of prednisone  -follow urine cx's -no signs of acute infection  11-tobacco abuse: Smoking cessation counseling has been provided. -Patient declined nicotine patch  Code Status: Full code DVT Prophylaxis: Heparin Family Communication: No family at bedside during evaluation Disposition Plan: Observation;  MedSurg; length of stay less than 2 midnights  Time spent: 70 minutes  Barton Dubois [facility] Pager [phone]

## 2014-08-30 NOTE — ED Notes (Signed)
Daughter is her primary HCPOA and reports he has not had a bowel movement in a few days.

## 2014-08-30 NOTE — ED Provider Notes (Signed)
CSN: [id number]     Arrival date & time 08/30/14  1400 History   First MD Initiated Contact with Patient 08/30/14 1500     Chief Complaint  Patient presents with  . Fall  . Hand Pain  . Abnormal Lab     (Consider location/radiation/quality/duration/timing/severity/associated sxs/prior Treatment) Patient is a 81 y.o. male presenting with altered mental status. The history is provided by a relative.  Altered Mental Status Presenting symptoms: behavior changes, confusion and disorientation   Severity:  Moderate Most recent episode:  Today Episode history:  Continuous Duration:  4 days Timing:  Intermittent Progression:  Waxing and waning Chronicity:  New Context: recent change in medication   Context comment:  Unwitnessed fall 4 days ago; neck pain over past week, taking narcotics for pain Associated symptoms: agitation and hallucinations   Associated symptoms: no abdominal pain, no difficulty breathing, no fever, no headaches, no light-headedness, no nausea, no palpitations, no rash, no seizures, no slurred speech, no visual change, no vomiting and no weakness     Past Medical History  Diagnosis Date  . COPD (chronic obstructive pulmonary disease)     GOLD II; Spirometry 07/10/2008 >FEV1 1.46 56% predicted, ratio of 66%  . Anemia     iron deficiency anemia with previous severe GI bleed   . Hypertension   . Hyperlipidemia   . Left bundle branch block   . Prostatic hypertrophy 09-11-97    Benign  . Obesity   . CHF (congestive heart failure)     secondary to nonischemic cardiomyopathy; ECHO 10/07 EF 20-25%, mod to severe MR; ECHO 1/10 EF 55-60%, mild MR; ECHO 2/11 55-65%, grade 1 diast dysfxn, miod to mod LAE, mild TR;    S/P Med[employer]onic BiV ICD with biventricular function now turned off due to diahragmatic simulation  . CAD (coronary artery disease)     Mild, nonobstructive (LHC 1/07: mLAD 20%, pCFX 20-30%, mRCA 30%, EF 25%)  . Atrial fibrillation     s/p AV node ablation; not on  coumadin due to GIB  . Cardiac arrest - ventricular fibrillation 02/2008    Aborted, shocked by ICD  . Dyspnea     exertional  . Pneumonia     01/2011  . Diabetes mellitus     type 2 NIDDM x 5-6 yrs  . GERD (gastroesophageal reflux disease)   . Cancer     prostate  . Arthritis     back and knees  . 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 Inguinal hernia     left; asymptomatic  . Chronic neck pain   . Cervical radiculopathy     left  . Torticollis    Past Surgical History  Procedure Laterality Date  . Lobectomy  01-21-98    lung  . Rotator cuff repair  1994 and 1995  . Knee surgery  1996    Left  . Cardiac catheterization  01/2006    Showed mild nonobstructive CAD  . Cardiac catheterization  11/05/2006    Right atrial pressure mean of 12, RV pressure 36/8, PA pressure 39/16 witha mean of 28, wedge pressure was 20. Fick cardiac output was 5 liters per minute, cardiac index was 2.4  . Esophagogastroduodenoscopy  09/1997    Prepylor ulcer, esoph ring, duod avm  . Colonoscopy w/ polypectomy  11/1997    Divertics, int hemms  . Mch gi bleed  02/07-02/12/2002  . Esophagogastroduodenoscopy  05/22/2002    Poss Barrett's   .  Colonoscopy  06/26/2002    Multip (neg) divertics, int hemm  . Colonoscopy  07/10/2004    Poyps, divertics, int hemms  . Mch sob  10/11-10/18/2007    A fib, CHF  . Esophagogastroduodenoscopy  02/19/2006    HH No active bleeding  . Ct head limited w/o cm  10/03/2006    No acute abnmlty  . Coronary angioplasty      Min abstrut zd severe LB dystn EF 25%  . Mch x  11/08-11/03/2006    Acute blood loss, anemia, sys HF, isch cardiomyopathy  . Hosp  8/14-8/23/2008    Acute on chronis CHF IIIB NOnisch Cardiomyop EF 20-25% Mod-Sev MR  . Colonoscopy  06/19/2008    2 polyps divertics int hemms (Dr Henrene Pastor)  . Insert / replace / remove pacemaker  2007  . Eye surgery      cataract, bilateral  . Anterior cervical discectomy  02/2010  . Knee  arthroscopy  1988  . Lumbar laminectomy/decompression microdiscectomy  04/29/2011    Procedure: LUMBAR LAMINECTOMY/DECOMPRESSION MICRODISCECTOMY;  Surgeon: Eustace Moore, MD;  Location: [facility];  Service: Neurosurgery;  Laterality: N/A;  Lumbar Two, Three, Four, Five Decompressive Lumbar Laminectomies  . Biv icd genertaor change out N/A 02/08/2013    Procedure: BIV ICD GENERTAOR CHANGE OUT;  Surgeon: Deboraha Sprang, MD;  Location: [facility] CATH LAB;  Service: Cardiovascular;  Laterality: N/A;   Family History  Problem Relation Age of Onset  . Heart failure Mother     CHF  . Heart failure Father     CHF  . Hypertension Sister   . Obesity Sister   . Cancer Brother     lung cancer  . Lung cancer Brother   . Liver disease Brother     ETOH  . Alcohol abuse Brother   . Cancer Brother   . COPD Brother   . Cancer Brother   . Anxiety disorder Brother   . COPD Brother   . Hypertension Sister    History  Substance Use Topics  . Smoking status: Current Every Day Smoker -- 1.00 packs/day for 60 years    Types: Cigarettes  . Smokeless tobacco: Never Used     Comment: Smoker since 82, quit for 2 years and started back in 03/2008  . Alcohol Use: 0.0 oz/week    0 Standard drinks or equivalent per week    Review of Systems  Constitutional: Negative for fever, chills, diaphoresis, appetite change and fatigue.  Eyes: Negative for photophobia and visual disturbance.  Respiratory: Negative for cough, chest tightness, shortness of breath and wheezing.   Cardiovascular: Negative for chest pain, palpitations and leg swelling.  Gastrointestinal: Negative for nausea, vomiting, abdominal pain, diarrhea and abdominal distention.  Genitourinary: Negative for dysuria, frequency and flank pain.  Musculoskeletal: Negative for back pain, neck pain and neck stiffness.  Skin: Negative for color change, pallor and rash.  Neurological: Negative for dizziness, seizures, syncope, facial asymmetry, speech  difficulty, weakness, light-headedness, numbness and headaches.  Psychiatric/Behavioral: Positive for hallucinations, confusion and agitation.  All other systems reviewed and are negative.     Allergies  Review of patient's allergies indicates no known allergies.  Home Medications   Prior to Admission medications   Medication Sig Start Date End Date Taking? Authorizing Provider  acetaminophen (TYLENOL) 500 MG tablet Take 500 mg by mouth every 6 (six) hours as needed for mild pain.    Yes Historical Provider, MD  albuterol (PROAIR HFA) 108 (90 BASE) MCG/ACT inhaler  Inhale 2 puffs into the lungs every 6 (six) hours as needed for wheezing or shortness of breath. 09/13/13  Yes Amy Cletis Athens, MD  allopurinol (ZYLOPRIM) 100 MG tablet Take 1 tablet (100 mg total) by mouth daily. 12/25/13  Yes Amy Cletis Athens, MD  aspirin 81 MG tablet Take 81 mg by mouth 2 (two) times daily.   Yes Historical Provider, MD  atorvastatin (LIPITOR) 20 MG tablet TAKE 1 TABLET DAILY AT     BEDTIME 09/03/13  Yes Amy E Bedsole, MD  AVODART 0.5 MG capsule TAKE 1 CAPSULE DAILY 04/17/13  Yes Amy E Bedsole, MD  carvedilol (COREG) 3.125 MG tablet TAKE 1 TABLET TWICE A DAY  WITH MEALS 08/14/14  Yes Jolaine Artist, MD  Cholecalciferol (TH VITAMIN D3) 2000 UNITS CAPS Take 2,000 Units by mouth daily.    Yes Historical Provider, MD  colchicine (COLCRYS) 0.6 MG tablet 2 tab po x 1 then repeat in 1 hour , then continue 1 tab daily until uric acid at goal with allopurinol. 04/17/14  Yes Amy Cletis Athens, MD  DEXILANT 60 MG capsule TAKE 1 CAPSULE DAILY 06/05/14  Yes Amy E Bedsole, MD  eplerenone (INSPRA) 25 MG tablet Take 0.5 tablets (12.5 mg total) by mouth daily. 08/14/14  Yes Jolaine Artist, MD  furosemide (LASIX) 40 MG tablet TAKE 1 TABLET DAILY 08/11/14  Yes Amy E Bedsole, MD  gabapentin (NEURONTIN) 300 MG capsule TAKE 1 CAPSULE 4 TIMES     DAILY 06/08/14  Yes Amy E Bedsole, MD  guaiFENesin (MUCINEX) 600 MG 12 hr tablet Take 1,200 mg by mouth  2 (two) times daily as needed for cough.    Yes Historical Provider, MD  KLOR-CON M20 20 MEQ tablet TAKE 1 TABLET TWICE A DAY 08/11/14  Yes Amy E Bedsole, MD  lisinopril (PRINIVIL,ZESTRIL) 2.5 MG tablet Take 1 tablet (2.5 mg total) by mouth daily. 08/14/14  Yes Shaune Pascal Bensimhon, MD  pantoprazole (PROTONIX) 40 MG tablet Take 1 tablet (40 mg total) by mouth daily. 08/29/14  Yes Amy Cletis Athens, MD  tamsulosin (FLOMAX) 0.4 MG CAPS capsule Take 0.4 mg by mouth daily after supper.   Yes Historical Provider, MD  tiotropium (SPIRIVA HANDIHALER) 18 MCG inhalation capsule INHALE THE CONTENTS OF ONE CAPSULE DAILY IN THE       MORNING VIA HANDIHALER     DEVICE 08/29/14  Yes Amy Cletis Athens, MD  traMADol (ULTRAM) 50 MG tablet Take 2 tablets (100 mg total) by mouth every 6 (six) hours as needed. Patient taking differently: Take 100 mg by mouth every 6 (six) hours as needed for moderate pain.  08/28/14  Yes Amy Cletis Athens, MD  predniSONE (DELTASONE) 20 MG tablet Take 1 tablet (20 mg total) by mouth daily with breakfast. 08/31/14   Debbe Odea, MD   BP 121/70 mmHg  Pulse 70  Temp(Src) 98.6 F (37 C) (Oral)  Resp 23  Ht 5' 6.5" (1.689 m)  Wt 184 lb 4.9 oz (83.6 kg)  BMI 29.31 kg/m2  SpO2 96% Physical Exam  Constitutional: He is oriented to person, place, and time. He appears well-developed and well-nourished. No distress.  HENT:  Head: Normocephalic and atraumatic.  Eyes: Conjunctivae and EOM are normal. Pupils are equal, round, and reactive to light.  Neck: Normal range of motion. Neck supple. No spinous process tenderness and no muscular tenderness present. No rigidity. Normal range of motion present.  Cardiovascular: Normal rate, regular rhythm, normal heart sounds and intact distal pulses.  Exam reveals no gallop and no friction rub.   No murmur heard. Pulmonary/Chest: Effort normal and breath sounds normal. No respiratory distress. He has no wheezes. He has no rales.  Abdominal: Soft. Bowel sounds are  normal. He exhibits no distension. There is no tenderness. There is no rebound and no guarding.  Musculoskeletal: Normal range of motion. He exhibits no edema or tenderness.  Neurological: He is alert and oriented to person, place, and time. He has normal strength. No cranial nerve deficit or sensory deficit. He exhibits normal muscle tone. Coordination normal. GCS eye subscore is 4. GCS verbal subscore is 5. GCS motor subscore is 6.  Skin: Skin is dry. No rash noted. He is not diaphoretic. No erythema. No pallor.  Nursing note and vitals reviewed.   ED Course  Procedures (including critical care time) Labs Review Labs Reviewed  COMPREHENSIVE METABOLIC PANEL - Abnormal; Notable for the following:    Glucose, Bld 123 (*)    Albumin 3.2 (*)    AST 80 (*)    ALT 121 (*)    Alkaline Phosphatase 127 (*)    All other components within normal limits  CBC WITH DIFFERENTIAL/PLATELET - Abnormal; Notable for the following:    WBC 12.3 (*)    RBC 3.67 (*)    Hemoglobin 10.4 (*)    HCT 32.7 (*)    Neutro Abs 7.9 (*)    Monocytes Relative 15 (*)    Monocytes Absolute 1.9 (*)    All other components within normal limits  URINALYSIS, ROUTINE W REFLEX MICROSCOPIC - Abnormal; Notable for the following:    Color, Urine AMBER (*)    Urobilinogen, UA 2.0 (*)    All other components within normal limits  ACETAMINOPHEN LEVEL - Abnormal; Notable for the following:    Acetaminophen (Tylenol), Serum <10 (*)    All other components within normal limits  VITAMIN B12 - Abnormal; Notable for the following:    Vitamin B-12 1850 (*)    All other components within normal limits  HEPATIC FUNCTION PANEL - Abnormal; Notable for the following:    Total Protein 6.2 (*)    Albumin 2.8 (*)    AST 81 (*)    ALT 114 (*)    All other components within normal limits  BASIC METABOLIC PANEL - Abnormal; Notable for the following:    Glucose, Bld 142 (*)    All other components within normal limits  CBC - Abnormal;  Notable for the following:    WBC 11.7 (*)    RBC 3.56 (*)    Hemoglobin 10.3 (*)    HCT 31.5 (*)    All other components within normal limits  HEMOGLOBIN A1C - Abnormal; Notable for the following:    Hgb A1c MFr Bld 6.6 (*)    All other components within normal limits  GLUCOSE, CAPILLARY - Abnormal; Notable for the following:    Glucose-Capillary 155 (*)    All other components within normal limits  URINE CULTURE  AMMONIA  CREATININE, SERUM  PHOSPHORUS  MAGNESIUM  GLUCOSE, CAPILLARY  I-STAT CG4 LACTIC ACID, ED    Imaging Review No results found.   EKG Interpretation   Date/Time:  Thursday Aug 30 2014 15:57:53 EDT Ventricular Rate:  70 PR Interval:  371 QRS Duration: 139 QT Interval:  452 QTC Calculation: 488 R Axis:   -79 Text Interpretation:  Ventricular-paced rhythm No further analysis  attempted due to paced rhythm Confirmed by BEATON  MD, ROBERT ([id number]) on  08/30/2014 5:36:08 PM      MDM   Final diagnoses:  Elevated LFTs  Altered mental status, unspecified altered mental status type    81 yo M with PMH of CAD, Afib, VT s/p pacemaker placement, COPD, CHF, HTN, HPL, presenting from PCP office due to concern for altered mental status and elevated LFTs.  History primarily from daughter and son- in-law.  Daughter reports that pt had unwitnessed fall 4 days ago.  Complained of severe neck pain not relieved by home Tramadol so brought to ED the next day where CT c-spine showed no acute abnormalities. Pt prescribed Vicodin.  Daughter states that pt continued to complain of neck pain and developed confusion, hallucinations, was difficult to control at home and not sleeping.  Brought to PCP where labwork showed elevated LFTs and leukocytosis so sent to ED.  Family reports pt more lucid today- has had no pain meds in 24 hours. Has had no other falls since initial fall 4 days ago.  No fevers, chest pain, SOB, abdominal pain, N/V/D, dysuria.  No anti-coagulant use.  Pt currently  reports no neck pain.  On presentation, pt alert, AFVSS, in NAD.  Currently oriented at baseline per family.  Neuro exam with no focal deficits.  Neck supple, non-tender, no meningismus.  Lungs CTAB.  Abdomen soft, non-tender.  No LE edema.  No other acute findings.  Possible infectious vs metabolic etiology of AMS, vs medication related given various narcotic meds this week. Less likely CVA given normal neuro exam but will check CT head as pt had recent fall.    Workup largely unremarkable.  LFTs mildly elevated but normal ammonia.  No evidence of infection.  CT head WNL.  Suspect medication-induced delirium.  Will admit for observation as mental status waxing and waning.  No other acute events during my care.  Discussed with attending Dr. Audie Pinto.         Ellwood Dense, MD 09/04/14 [id number]  Leonard Schwartz, MD 09/07/14 [phone]

## 2014-08-30 NOTE — Telephone Encounter (Signed)
Pt to go to ER. See CMA note.

## 2014-08-30 NOTE — ED Notes (Addendum)
Pt reports he was pushing a mattress and thought he had a sprain; MD did xray and said said worse but not anything anyone can do about it. CT scan ok. Pt getting more confused and agitated on Tuesday. Did bloodwork and checked urine and MD changed his pain meds. To tramadol and flexiril. Report he is having hallucinations and completely disoriented.  Pt and family poor historians. Went back to MD and called to say WBC's elevated and liver enzymes off. Does not take blood thinners.

## 2014-08-30 NOTE — Telephone Encounter (Signed)
Discussed in detail with daughter. Answered questions.

## 2014-08-30 NOTE — ED Notes (Signed)
Patient transported to Ultrasound 

## 2014-08-30 NOTE — ED Notes (Signed)
Patient denies pain and is resting comfortably.  

## 2014-08-30 NOTE — Telephone Encounter (Signed)
Pt seen 08/28/14; [name] pts daughter said pt took Tramadol 50 mg q 6 h on 08/29/14. Pt continues in pain, pt cannot get up by himself; [name] is having to pull on pt to move him around.Last night pt was up and down all night and was seeing "little people". Pt continues to claim that he has fallen; [name] said she is not sure if possibly pt fell and is just confused about the date of fall. [name] is sure pt has not fallen in last few days but prior to that pt moved around on hoveround chair by himself and could possibly have fallen then. [name] said pts personality has totally changed and wondered if pt could have had a stroke; [name] wants to know if could get CT of head. [name] also wants to know how to get a nurse (? Home health) in the home to assess pt and help with his care; [name] is exhausted and request cb.

## 2014-08-30 NOTE — Telephone Encounter (Signed)
Patient's daughter,[name],called to let Dr.Bedsole know she took patient to [facility] ER.  Patient had a CT Scan of the head and it was fine.  [name] said they don't seem to be concerned about his white count and liver function.  [name] said the hospital said it wasn't a big deal.

## 2014-08-31 ENCOUNTER — Ambulatory Visit: Payer: Medicare Other | Admitting: Family Medicine

## 2014-08-31 ENCOUNTER — Encounter (HOSPITAL_COMMUNITY): Payer: Self-pay | Admitting: *Deleted

## 2014-08-31 DIAGNOSIS — G9340 Encephalopathy, unspecified: Secondary | ICD-10-CM | POA: Diagnosis not present

## 2014-08-31 DIAGNOSIS — I4891 Unspecified atrial fibrillation: Secondary | ICD-10-CM | POA: Diagnosis not present

## 2014-08-31 DIAGNOSIS — M109 Gout, unspecified: Secondary | ICD-10-CM | POA: Diagnosis not present

## 2014-08-31 DIAGNOSIS — I5032 Chronic diastolic (congestive) heart failure: Secondary | ICD-10-CM | POA: Diagnosis not present

## 2014-08-31 LAB — CBC
HCT: 31.5 % — ABNORMAL LOW (ref 39.0–52.0)
Hemoglobin: 10.3 g/dL — ABNORMAL LOW (ref 13.0–17.0)
MCH: 28.9 pg (ref 26.0–34.0)
MCHC: 32.7 g/dL (ref 30.0–36.0)
MCV: 88.5 fL (ref 78.0–100.0)
Platelets: 227 10*3/uL (ref 150–400)
RBC: 3.56 MIL/uL — AB (ref 4.22–5.81)
RDW: 15 % (ref 11.5–15.5)
WBC: 11.7 10*3/uL — AB (ref 4.0–10.5)

## 2014-08-31 LAB — URINALYSIS, ROUTINE W REFLEX MICROSCOPIC
Bilirubin Urine: NEGATIVE
Glucose, UA: NEGATIVE mg/dL
Hgb urine dipstick: NEGATIVE
Ketones, ur: NEGATIVE mg/dL
LEUKOCYTES UA: NEGATIVE
NITRITE: NEGATIVE
PH: 7 (ref 5.0–8.0)
Protein, ur: NEGATIVE mg/dL
SPECIFIC GRAVITY, URINE: 1.016 (ref 1.005–1.030)
UROBILINOGEN UA: 2 mg/dL — AB (ref 0.0–1.0)

## 2014-08-31 LAB — HEPATIC FUNCTION PANEL
ALK PHOS: 124 U/L (ref 38–126)
ALT: 114 U/L — ABNORMAL HIGH (ref 17–63)
AST: 81 U/L — ABNORMAL HIGH (ref 15–41)
Albumin: 2.8 g/dL — ABNORMAL LOW (ref 3.5–5.0)
BILIRUBIN DIRECT: 0.5 mg/dL (ref 0.1–0.5)
BILIRUBIN INDIRECT: 0.7 mg/dL (ref 0.3–0.9)
BILIRUBIN TOTAL: 1.2 mg/dL (ref 0.3–1.2)
Total Protein: 6.2 g/dL — ABNORMAL LOW (ref 6.5–8.1)

## 2014-08-31 LAB — BASIC METABOLIC PANEL
Anion gap: 9 (ref 5–15)
BUN: 16 mg/dL (ref 6–20)
CALCIUM: 8.9 mg/dL (ref 8.9–10.3)
CO2: 25 mmol/L (ref 22–32)
CREATININE: 0.86 mg/dL (ref 0.61–1.24)
Chloride: 101 mmol/L (ref 101–111)
GFR calc Af Amer: >60 mL/min (ref >60)
GFR calc non Af Amer: >60 mL/min (ref >60)
GLUCOSE: 142 mg/dL — AB (ref 65–99)
Potassium: 3.8 mmol/L (ref 3.5–5.1)
Sodium: 135 mmol/L (ref 135–145)

## 2014-08-31 LAB — GLUCOSE, CAPILLARY: GLUCOSE-CAPILLARY: 155 mg/dL — AB (ref 65–99)

## 2014-08-31 MED ORDER — PREDNISONE 20 MG PO TABS: 20.0000 mg | ORAL_TABLET | Freq: Every day | ORAL | Status: DC

## 2014-08-31 NOTE — Progress Notes (Signed)
[name] to be D/C'd Home per MD order.  Discussed with the patient and all questions fully answered.  VSS, Skin clean, dry and intact without evidence of skin break down, no evidence of skin tears noted. IV catheter discontinued intact. Site without signs and symptoms of complications. Dressing and pressure applied.  An After Visit Summary was printed and given to the patient. Patient received prescription.  D/c education completed with patient/family including follow up instructions, medication list, d/c activities limitations if indicated, with other d/c instructions as indicated by MD - patient able to verbalize understanding, all questions fully answered.   Patient instructed to return to ED, call 911, or call MD for any changes in condition.   Patient escorted via [place], and D/C home via private auto.  L'ESPERANCE, [name] 08/31/2014 10:38 AM

## 2014-08-31 NOTE — Discharge Summary (Signed)
Physician Discharge Summary  RYKER SUDBURY YSA:[id number] DOB: Jan 25, 1931 DOA: 08/30/2014  PCP: Eliezer Lofts, MD  Admit date: 08/30/2014 Discharge date: 08/31/2014  Time spent: 45 min    Discharge Condition: stable Diet recommendation: heart healthy diet  Discharge Diagnoses:  Principal Problem:   Acute encephalopathy Active Problems:   Acute gouty arthritis   Diabetes mellitus with no complication   COPD (chronic obstructive pulmonary disease)   Chronic diastolic heart failure   Atrial fibrillation   Essential hypertension, benign   Transaminitis   History of present illness:  [name] is a 81 y.o. male with a significant past medical history significant for COPD, hypertension, diabetes type 2 with neuropathy, chronic atrial fibrillation with ventricular fibrillation (status post pacemaker; not on anticoagulation), hyperlipidemia, gout, GERD, CHF and chronic neck pain; who was recently seen in the emergency department secondary to acute on chronic cervical pain after increased physical activity at home, and the moment patient had images studies done demonstrating no acute process in his neck and was discharged home on hydrocodone with follow-up at his PCP office. After patient started taking medications he had increased confusion and changes in mentation. At the time that he was seen by his PCP, further workup for his altered mental status was done with urinalysis TSH and the patient was taken off hydrocodone. Despite not taking the narcotics patient continued experiencing fluctuation in his mental status and per patient's daughter was difficult to handle at home recently she brought the patient back to the emergency department. He is also found to have gout of his left hand.   Hospital Course:  Acute encephalopathy - resolved and patient back to baseline- daughter at bedside agrees that he is back at baseline - avoid narcotics in the future  Gout left hand - improving  with steroids- will continue Prednisone 20 mg daily for 3 more days  Leukocytosis: Most likely secondary to demargination due to acute gouty arthritis and presumed mild rhabdomyolysis -no signs of acute infection  Diabetes mellitus with neuropathy: Will check A1c.  -Continue Neurontin at adjusted dose -SSI  COPD (chronic obstructive pulmonary disease): Currently compensated on patient no complaining of shortness of breath and no wheezing on physical exam. -Will continue home medications regimen  Chronic diastolic heart failure:  -has been compensated  Chronic Atrial fibrillation: Not a candidate for anticoagulation -Rate controlled -continue ASA  Essential hypertension, benign: Stable and well controlled. -Continue home medication regimen  Transaminitis: - Mild and most likely secondary to presumed rhabdomyolysis with continuation of statins - abdominal ultrasound obtained on 5/19- revealed 31mm non-mobile intraluminal mass (polyp vs calculus)- see report below - patient is without abdominal pain and GI symptoms.  Ventricular fibrillation:  - status post pacemaker which was interrogated on the day of admission and noted to be working properly  Tobacco abuse: Smoking cessation counseling has been provided. -Patient declined nicotine patch   Discharge Exam: Filed Weights   08/30/14 1942  Weight: 83.6 kg (184 lb 4.9 oz)   Filed Vitals:   08/31/14 0840  BP: 121/70  Pulse:   Temp:   Resp:     General: AAO x 3, no distress Cardiovascular: RRR, no murmurs  Respiratory: clear to auscultation bilaterally GI: soft, non-tender, non-distended, bowel sound positive  Discharge Instructions You were cared for by a hospitalist during your hospital stay. If you have any questions about your discharge medications or the care you received while you were in the hospital after you are discharged, you can call  the unit and asked to speak with the hospitalist on call if the hospitalist  that took care of you is not available. Once you are discharged, your primary care physician will handle any further medical issues. Please note that NO REFILLS for any discharge medications will be authorized once you are discharged, as it is imperative that you return to your primary care physician (or establish a relationship with a primary care physician if you do not have one) for your aftercare needs so that they can reassess your need for medications and monitor your lab values.      Discharge Instructions    Diet - low sodium heart healthy    Complete by:  As directed      Increase activity slowly    Complete by:  As directed             Medication List    TAKE these medications        albuterol 108 (90 BASE) MCG/ACT inhaler  Commonly known as:  PROAIR HFA  Inhale 2 puffs into the lungs every 6 (six) hours as needed for wheezing or shortness of breath.     allopurinol 100 MG tablet  Commonly known as:  ZYLOPRIM  Take 1 tablet (100 mg total) by mouth daily.     aspirin 81 MG tablet  Take 81 mg by mouth 2 (two) times daily.     atorvastatin 20 MG tablet  Commonly known as:  LIPITOR  TAKE 1 TABLET DAILY AT     BEDTIME     AVODART 0.5 MG capsule  Generic drug:  dutasteride  TAKE 1 CAPSULE DAILY     carvedilol 3.125 MG tablet  Commonly known as:  COREG  TAKE 1 TABLET TWICE A DAY  WITH MEALS     colchicine 0.6 MG tablet  Commonly known as:  COLCRYS  2 tab po x 1 then repeat in 1 hour , then continue 1 tab daily until uric acid at goal with allopurinol.     DEXILANT 60 MG capsule  Generic drug:  dexlansoprazole  TAKE 1 CAPSULE DAILY     eplerenone 25 MG tablet  Commonly known as:  INSPRA  Take 0.5 tablets (12.5 mg total) by mouth daily.     furosemide 40 MG tablet  Commonly known as:  LASIX  TAKE 1 TABLET DAILY     gabapentin 300 MG capsule  Commonly known as:  NEURONTIN  TAKE 1 CAPSULE 4 TIMES     DAILY     guaiFENesin 600 MG 12 hr tablet  Commonly known  as:  MUCINEX  Take 1,200 mg by mouth 2 (two) times daily as needed for cough.     KLOR-CON M20 20 MEQ tablet  Generic drug:  potassium chloride SA  TAKE 1 TABLET TWICE A DAY     lisinopril 2.5 MG tablet  Commonly known as:  PRINIVIL,ZESTRIL  Take 1 tablet (2.5 mg total) by mouth daily.     pantoprazole 40 MG tablet  Commonly known as:  PROTONIX  Take 1 tablet (40 mg total) by mouth daily.     predniSONE 20 MG tablet  Commonly known as:  DELTASONE  Take 1 tablet (20 mg total) by mouth daily with breakfast.     tamsulosin 0.4 MG Caps capsule  Commonly known as:  FLOMAX  Take 0.4 mg by mouth daily after supper.     TH VITAMIN D3 2000 UNITS Caps  Generic drug:  Cholecalciferol  Take 2,000  Units by mouth daily.     tiotropium 18 MCG inhalation capsule  Commonly known as:  SPIRIVA HANDIHALER  INHALE THE CONTENTS OF ONE CAPSULE DAILY IN THE       MORNING VIA HANDIHALER     DEVICE     traMADol 50 MG tablet  Commonly known as:  ULTRAM  Take 2 tablets (100 mg total) by mouth every 6 (six) hours as needed.     TYLENOL 500 MG tablet  Generic drug:  acetaminophen  Take 500 mg by mouth every 6 (six) hours as needed for mild pain.       No Known Allergies    The results of significant diagnostics from this hospitalization (including imaging, microbiology, ancillary and laboratory) are listed below for reference.    Significant Diagnostic Studies: Dg Chest 2 View  08/24/2014   CLINICAL DATA:  COPD.  EXAM: CHEST  2 VIEW  COMPARISON:  11/05/2011.  FINDINGS: Mediastinum hilar structures are normal. Lungs are clear acute infiltrates. AICD noted. No evidence of pleural effusion or pneumothorax. Degenerative changes thoracic spine. Old right rib fractures. Prior cervical spine fusion. Diffuse thoracic spine osteopenia degenerative change.  IMPRESSION: 1. AICD.  Stable cardiomegaly.  No CHF. 2. No acute pulmonary disease.   Electronically Signed   By: Marcello Moores  Register   On: 08/24/2014 11:19    Ct Head Wo Contrast  08/30/2014   CLINICAL DATA:  Fall, patient unable to provide additional history; history COPD, hypertension, CHF, coronary artery disease, diabetes mellitus, atrial fibrillation  EXAM: CT HEAD WITHOUT CONTRAST  TECHNIQUE: Contiguous axial images were obtained from the base of the skull through the vertex without intravenous contrast.  COMPARISON:  10/03/2006  FINDINGS: Generalized atrophy.  Normal ventricular morphology.  No midline shift or mass effect.  Otherwise normal appearance of brain parenchyma.  No intracranial hemorrhage, mass lesion or evidence acute infarction.  No extra-axial fluid collections.  Atherosclerotic calcification at carotid siphons and vertebral arteries at skullbase.  Question small osteoma LEFT frontal sinus.  Visualized paranasal sinuses and mastoid air cells otherwise clear.  Bones unremarkable.  IMPRESSION: No acute intracranial abnormalities.   Electronically Signed   By: Lavonia Dana M.D.   On: 08/30/2014 16:17   Ct Cervical Spine Wo Contrast  08/26/2014   CLINICAL DATA:  Neck pain radiating down left arm beginning last night. No injury. Previous cervical fusion 2011.  EXAM: CT CERVICAL SPINE WITHOUT CONTRAST  TECHNIQUE: Multidetector CT imaging of the cervical spine was performed without intravenous contrast. Multiplanar CT image reconstructions were also generated.  COMPARISON:  CT 03/04/2010 and plain films 11/10/2010  FINDINGS: Examination demonstrates patient's anterior fusion hardware at the C3-4 level with tall intervertebral disc spacer at the intervening disc space unchanged. There is fusion of the left C3-4 facets. There is moderate spondylosis of the cervical spine. There is moderate disc space narrowing at the C5-6 level unchanged. There is a very subtle 1-2 mm posterior subluxation of C5 with respect to C6 unchanged. Prevertebral soft tissues are within normal. Moderate degenerative changes at the C1-2 articulation with subchondral cysts within  the C2 vertebral body and base of dens. The slightly more pronounced pannus formation posterior to the dens. No acute fracture. Moderate uncovertebral joint spurring and facet arthropathy is present. Bilateral neural foraminal narrowing is present at multiple levels due to adjacent bony spurring. Remainder the exam is unremarkable.  IMPRESSION: No acute findings.  Moderate spondylosis of the cervical spine with moderate disc disease at the C5-6 level unchanged.  Anterior fusion hardware intact with intervertebral disc spacer at the C3-4 level.  Multilevel bilateral neural foraminal narrowing due to adjacent bony spurring.  Mild degenerative change of the C1-2 articulation with significant subchondral cyst formation of C2 with progression compared to 07/28/09. Slightly more pronounced pannus formation posterior to the dens.   Electronically Signed   By: Marin Olp M.D.   On: 08/26/2014 15:27   Dg Hand Complete Left  08/30/2014   CLINICAL DATA:  Twitching mat trace, now with hand sprain. Hallucinations.  EXAM: LEFT HAND - COMPLETE 3+ VIEW  COMPARISON:  None.  FINDINGS: No fracture or dislocation. Mild-to-moderate diffuse degenerative change, most conspicuously involving the second and third MCP joints with joint space loss, subchondral sclerosis and osteophytosis. A tiny ossicles noted about the first MCP joint, likely the sequela of prior/remote avulsive injury. A minimal amount of chondrocalcinosis is noted within the radiocarpal joint. Regional soft tissues appear otherwise normal. No radiopaque foreign body.  IMPRESSION: 1. No acute findings. 2. Mild-to-moderate diffuse degenerative change of the hand, most conspicuously involving the second and third MCP joints. 3. Chondrocalcinosis suggestive of CPPD.   Electronically Signed   By: Sandi Mariscal M.D.   On: 08/30/2014 15:22   US Abdomen Limited Ruq  08/30/2014   CLINICAL DATA:  Elevated LFTs, history COPD, hypertension, hyperlipidemia, CHF, coronary artery  disease post ventricular fibrillation and cardiac arrest, diabetes mellitus, smoker, prostate cancer  EXAM: US ABDOMEN LIMITED - RIGHT UPPER QUADRANT  COMPARISON:  None  FINDINGS: Gallbladder:  7 mm echogenic non shadowing non mobile focus at gallbladder neck question polyp versus non shadowing adherent stone. Gallbladder wall appears upper normal in thickness. No pericholecystic fluid, shadowing calculi or sonographic Murphy sign.  Common bile duct:  Diameter: Normal caliber 4 mm diameter  Liver:  Upper normal parenchymal echogenicity. Echogenic mass posterior RIGHT lobe 2.1 x 2.0 x 2.0 cm, nonspecific, could potentially represent hemangioma though other echogenic masses are not excluded. No additional focal mass lesions. No intrahepatic biliary dilatation. Hepatopetal portal venous flow.  No RIGHT upper quadrant free fluid.  IMPRESSION: 7 mm non shadowing non mobile intraluminal focus at gallbladder neck question polyp versus non mobile calculus.  Echogenic lesion RIGHT lobe liver 2.1 x 2.0 x 2.0 cm, nonspecific, could represent a hemangioma but other etiologies including tumor not excluded ; in light of history of malignancy and presence of an implanted defibrillator, further assessment of this lesion by followup non emergent CT abdomen with and without contrast recommended to characterize.   Electronically Signed   By: Lavonia Dana M.D.   On: 08/30/2014 19:51    Microbiology: No results found for this or any previous visit (from the past 240 hour(s)).   Labs: Basic Metabolic Panel:  Recent Labs Lab 08/28/14 1456 08/30/14 1433 08/30/14 29-Jul-2119 08/31/14 0429  NA 137 136  --  135  K 3.9 3.8  --  3.8  CL 103 102  --  101  CO2 28 22  --  25  GLUCOSE 107* 123*  --  142*  BUN 19 16  --  16  CREATININE 0.85 0.86 0.76 0.86  CALCIUM 9.4 9.0  --  8.9  MG  --   --  2.0  --   PHOS  --   --  3.5  --    Liver Function Tests:  Recent Labs Lab 08/28/14 1456 08/30/14 1433 08/31/14 0429  AST 74* 80*  81*  ALT 66* 121* 114*  ALKPHOS 115 127* 124  BILITOT 0.9 0.8 1.2  PROT 7.0 6.8 6.2*  ALBUMIN 4.0 3.2* 2.8*   No results for input(s): LIPASE, AMYLASE in the last 168 hours.  Recent Labs Lab 08/30/14 1433  AMMONIA 25   CBC:  Recent Labs Lab 08/28/14 1456 08/30/14 1433 08/31/14 0429  WBC 14.6* 12.3* 11.7*  NEUTROABS 10.2* 7.9*  --   HGB 11.4* 10.4* 10.3*  HCT 34.9* 32.7* 31.5*  MCV 89.0 89.1 88.5  PLT 238.0 241 227   Cardiac Enzymes: No results for input(s): CKTOTAL, CKMB, CKMBINDEX, TROPONINI in the last 168 hours. BNP: BNP (last 3 results) No results for input(s): BNP in the last 8760 hours.  ProBNP (last 3 results) No results for input(s): PROBNP in the last 8760 hours.  CBG:  Recent Labs Lab 08/30/14 2057 08/31/14 0814  GLUCAP 87 155*       SignedDebbe Odea, MD [facility] 08/31/2014, 4:53 PM

## 2014-09-01 LAB — HEMOGLOBIN A1C
HEMOGLOBIN A1C: 6.6 % — AB (ref 4.8–5.6)
Mean Plasma Glucose: 143 mg/dL

## 2014-09-01 LAB — URINE CULTURE
COLONY COUNT: NO GROWTH
CULTURE: NO GROWTH

## 2014-09-04 LAB — CUP PACEART REMOTE DEVICE CHECK
Battery Remaining Longevity: 63 mo
Battery Voltage: 2.97 V
Brady Statistic AP VS Percent: 0 %
Brady Statistic AS VS Percent: 1.83 %
Brady Statistic RV Percent Paced: 98.65 %
Date Time Interrogation Session: 20160519173629
HighPow Impedance: 40 Ohm
HighPow Impedance: 51 Ohm
Lead Channel Impedance Value: 4047 Ohm
Lead Channel Impedance Value: 456 Ohm
Lead Channel Impedance Value: 494 Ohm
Lead Channel Pacing Threshold Pulse Width: 0.4 ms
Lead Channel Pacing Threshold Pulse Width: 0.8 ms
Lead Channel Sensing Intrinsic Amplitude: 0.625 mV
Lead Channel Sensing Intrinsic Amplitude: 10.125 mV
Lead Channel Setting Pacing Amplitude: 2.5 V
Lead Channel Setting Pacing Amplitude: 2.5 V
Lead Channel Setting Pacing Pulse Width: 0.4 ms
Lead Channel Setting Pacing Pulse Width: 0.8 ms
MDC IDC MSMT LEADCHNL LV IMPEDANCE VALUE: 4047 Ohm
MDC IDC MSMT LEADCHNL LV IMPEDANCE VALUE: 456 Ohm
MDC IDC MSMT LEADCHNL LV PACING THRESHOLD AMPLITUDE: 1.75 V
MDC IDC MSMT LEADCHNL RA SENSING INTR AMPL: 0.625 mV
MDC IDC MSMT LEADCHNL RV IMPEDANCE VALUE: 380 Ohm
MDC IDC MSMT LEADCHNL RV PACING THRESHOLD AMPLITUDE: 0.75 V
MDC IDC MSMT LEADCHNL RV SENSING INTR AMPL: 10.125 mV
MDC IDC SET LEADCHNL RV SENSING SENSITIVITY: 0.3 mV
MDC IDC SET ZONE DETECTION INTERVAL: 300 ms
MDC IDC STAT BRADY AP VP PERCENT: 0 %
MDC IDC STAT BRADY AS VP PERCENT: 98.17 %
MDC IDC STAT BRADY RA PERCENT PACED: 0 %
Zone Setting Detection Interval: 350 ms
Zone Setting Detection Interval: 350 ms
Zone Setting Detection Interval: 450 ms

## 2014-09-05 DIAGNOSIS — M961 Postlaminectomy syndrome, not elsewhere classified: Secondary | ICD-10-CM | POA: Diagnosis not present

## 2014-09-05 DIAGNOSIS — M542 Cervicalgia: Secondary | ICD-10-CM | POA: Diagnosis not present

## 2014-09-05 DIAGNOSIS — M5412 Radiculopathy, cervical region: Secondary | ICD-10-CM | POA: Diagnosis not present

## 2014-09-05 DIAGNOSIS — S161XXD Strain of muscle, fascia and tendon at neck level, subsequent encounter: Secondary | ICD-10-CM | POA: Diagnosis not present

## 2014-09-06 ENCOUNTER — Other Ambulatory Visit: Payer: Self-pay | Admitting: Family Medicine

## 2014-09-12 ENCOUNTER — Other Ambulatory Visit (HOSPITAL_COMMUNITY): Payer: Self-pay | Admitting: Internal Medicine

## 2014-09-12 DIAGNOSIS — I739 Peripheral vascular disease, unspecified: Secondary | ICD-10-CM

## 2014-09-13 ENCOUNTER — Other Ambulatory Visit (HOSPITAL_COMMUNITY): Payer: Self-pay | Admitting: Internal Medicine

## 2014-09-13 DIAGNOSIS — I739 Peripheral vascular disease, unspecified: Secondary | ICD-10-CM

## 2014-09-19 ENCOUNTER — Encounter: Payer: Self-pay | Admitting: Cardiology

## 2014-09-24 ENCOUNTER — Other Ambulatory Visit: Payer: Medicare Other

## 2014-09-26 ENCOUNTER — Encounter: Payer: Self-pay | Admitting: Internal Medicine

## 2014-09-27 ENCOUNTER — Encounter: Payer: Self-pay | Admitting: Family Medicine

## 2014-09-27 ENCOUNTER — Ambulatory Visit (INDEPENDENT_AMBULATORY_CARE_PROVIDER_SITE_OTHER): Payer: Medicare Other | Admitting: Family Medicine

## 2014-09-27 VITALS — BP 118/60 | HR 67 | Temp 97.8°F | Wt 176.0 lb

## 2014-09-27 DIAGNOSIS — I10 Essential (primary) hypertension: Secondary | ICD-10-CM

## 2014-09-27 DIAGNOSIS — D72829 Elevated white blood cell count, unspecified: Secondary | ICD-10-CM

## 2014-09-27 DIAGNOSIS — E1140 Type 2 diabetes mellitus with diabetic neuropathy, unspecified: Secondary | ICD-10-CM

## 2014-09-27 DIAGNOSIS — G9340 Encephalopathy, unspecified: Secondary | ICD-10-CM | POA: Diagnosis not present

## 2014-09-27 DIAGNOSIS — R7989 Other specified abnormal findings of blood chemistry: Secondary | ICD-10-CM

## 2014-09-27 DIAGNOSIS — M4806 Spinal stenosis, lumbar region: Secondary | ICD-10-CM

## 2014-09-27 DIAGNOSIS — K769 Liver disease, unspecified: Secondary | ICD-10-CM

## 2014-09-27 DIAGNOSIS — M48061 Spinal stenosis, lumbar region without neurogenic claudication: Secondary | ICD-10-CM

## 2014-09-27 DIAGNOSIS — R945 Abnormal results of liver function studies: Secondary | ICD-10-CM

## 2014-09-27 DIAGNOSIS — K7689 Other specified diseases of liver: Secondary | ICD-10-CM

## 2014-09-27 LAB — CBC WITH DIFFERENTIAL/PLATELET
Basophils Absolute: 0 10*3/uL (ref 0.0–0.1)
Basophils Relative: 0.5 % (ref 0.0–3.0)
EOS PCT: 2.5 % (ref 0.0–5.0)
Eosinophils Absolute: 0.3 10*3/uL (ref 0.0–0.7)
HEMATOCRIT: 34.5 % — AB (ref 39.0–52.0)
Hemoglobin: 11.2 g/dL — ABNORMAL LOW (ref 13.0–17.0)
LYMPHS ABS: 2.3 10*3/uL (ref 0.7–4.0)
Lymphocytes Relative: 22.8 % (ref 12.0–46.0)
MCHC: 32.6 g/dL (ref 30.0–36.0)
MCV: 89.1 fl (ref 78.0–100.0)
Monocytes Absolute: 1.1 10*3/uL — ABNORMAL HIGH (ref 0.1–1.0)
Monocytes Relative: 10.5 % (ref 3.0–12.0)
Neutro Abs: 6.5 10*3/uL (ref 1.4–7.7)
Neutrophils Relative %: 63.7 % (ref 43.0–77.0)
Platelets: 242 10*3/uL (ref 150.0–400.0)
RBC: 3.87 Mil/uL — AB (ref 4.22–5.81)
RDW: 16.3 % — ABNORMAL HIGH (ref 11.5–15.5)
WBC: 10.1 10*3/uL (ref 4.0–10.5)

## 2014-09-27 LAB — HM DIABETES FOOT EXAM

## 2014-09-27 LAB — COMPREHENSIVE METABOLIC PANEL
ALBUMIN: 3.8 g/dL (ref 3.5–5.2)
ALK PHOS: 120 U/L — AB (ref 39–117)
ALT: 14 U/L (ref 0–53)
AST: 19 U/L (ref 0–37)
BUN: 18 mg/dL (ref 6–23)
CO2: 27 mEq/L (ref 19–32)
Calcium: 9.4 mg/dL (ref 8.4–10.5)
Chloride: 104 mEq/L (ref 96–112)
Creatinine, Ser: 1.04 mg/dL (ref 0.40–1.50)
GFR: 72.69 mL/min (ref >60.00)
Glucose, Bld: 121 mg/dL — ABNORMAL HIGH (ref 70–99)
Potassium: 4.3 mEq/L (ref 3.5–5.1)
SODIUM: 136 meq/L (ref 135–145)
Total Bilirubin: 0.7 mg/dL (ref 0.2–1.2)
Total Protein: 6.6 g/dL (ref 6.0–8.3)

## 2014-09-27 NOTE — Assessment & Plan Note (Signed)
Flare in  hand resolved S/P course of prednsione.

## 2014-09-27 NOTE — Assessment & Plan Note (Signed)
Well controlled DM per A1C in hospital.

## 2014-09-27 NOTE — Progress Notes (Signed)
Pre visit review using our clinic review tool, if applicable. No additional management support is needed unless otherwise documented below in the visit note. 

## 2014-09-27 NOTE — Assessment & Plan Note (Signed)
Well controlled. Continue current medication.  

## 2014-09-27 NOTE — Patient Instructions (Addendum)
Change next appt to 6 month from now for CPX with labs prior. Cancel September appt.  Stop at lab on way out.  Stop at front desk to set up CT scan of abdomen to evaluate liver lesion.

## 2014-09-27 NOTE — Assessment & Plan Note (Signed)
Pain now improved and only using tramadol for pain.

## 2014-09-27 NOTE — Assessment & Plan Note (Signed)
Secondary to narcotics. Improved off, now back at baseline.

## 2014-09-27 NOTE — Assessment & Plan Note (Signed)
Due for re-eval. 

## 2014-09-27 NOTE — Assessment & Plan Note (Signed)
Eval with  CT abd with contrast to eval further given history of cancer.

## 2014-09-27 NOTE — Assessment & Plan Note (Signed)
Due for re-eval. ? Secondary to gout.

## 2014-09-27 NOTE — Assessment & Plan Note (Signed)
Well controlled  A!C in ER.

## 2014-09-27 NOTE — Progress Notes (Signed)
Subjective:    Patient ID: [name], male    DOB: 1930-07-22, 81 y.o.   MRN: [id number]  HPI 81 year old male presents for hospital follow up.  He was seen at ED on  5/19-5/20 for acute encephalopathy.  He was recently seen prior to this admission at  the emergency department secondary to acute on chronic cervical pain after increased physical activity at home, and the moment patient had images studies done demonstrating no acute process in his neck and was discharged home on hydrocodone with follow-up in PCP office. After patient started taking medications he had increased confusion and changes in mentation. At the time that he was seen by myself, further workup for his altered mental status was done with urinalysis TSH and the patient was taken off hydrocodone. Despite not taking the narcotics patient continued experiencing fluctuation in his mental status and per patient's daughter was difficult to handle at home recently she brought the patient back to the emergency department. He is also found to have gout of his left hand.   His acute encephalopathy improved gradually with time off narcotics. Back to baseline now.   Gout, resolved with prednisone taper. No pain in hand now.  Leukocytosis: Felt most likely secondary to demargination due to acute gouty arthritis and presumed mild rhabdomyolysis -no signs of acute infection  DM well controlled on current regimen. With stable neuropathy due to back issue and diabetes. Lab Results  Component Value Date   HGBA1C 6.6* 08/31/2014   Transaminitis: - Mild and most likely secondary to presumed rhabdomyolysis with continuation of statins - abdominal ultrasound obtained on 5/19- revealed 69mm non-mobile intraluminal mass (polyp vs calculus)- could represent a hemangioma but other etiologies including tumor not excluded ; in light of history of malignancy and presence of an implanted defibrillator, further assessment of this lesion  by followup non emergent CT abdomen with and without contrast recommended to characterize. Hx of prastate and skin cancer. - patient was without abdominal pain and GI symptoms.  Hypertension:    Well controlled BP Readings from Last 3 Encounters:  09/27/14 118/60  08/31/14 121/70  08/28/14 130/67  Using medication without problems or lightheadedness:  Chest pain with exertion: Edema: Short of breath: Average home BPs: Other issues:   Pain in back well controlled with Tramadol for pain rarely. Using muscle relaxer prn.    Review of Systems  Constitutional: Negative for fever and fatigue.  HENT: Negative for ear pain.   Eyes: Negative for pain.  Respiratory: Negative for cough.   Cardiovascular: Negative for chest pain.  Gastrointestinal: Negative for abdominal pain.       Objective:   Physical Exam  Constitutional: He is oriented to person, place, and time. Vital signs are normal. He appears well-developed and well-nourished.  Elderly male in NAD  HENT:  Head: Normocephalic.  Right Ear: Hearing normal.  Left Ear: Hearing normal.  Nose: Nose normal.  Mouth/Throat: Oropharynx is clear and moist and mucous membranes are normal.  Neck: Trachea normal. Carotid bruit is not present. No thyroid mass and no thyromegaly present.  Cardiovascular: Normal rate and normal pulses.  An irregularly irregular rhythm present. Exam reveals no gallop and no friction rub.   No murmur heard. No peripheral edema  Pulmonary/Chest: Effort normal and breath sounds normal. No respiratory distress.  Abdominal: Soft. Normal appearance and bowel sounds are normal. He exhibits no distension, no pulsatile liver, no fluid wave, no abdominal bruit, no ascites and no mass. There  is no hepatosplenomegaly. There is no tenderness. There is no CVA tenderness. No hernia.  Neurological: He is oriented to person, place, and time. He has normal strength. No cranial nerve deficit or sensory deficit. Gait abnormal.   Using cane.  Skin: Skin is warm, dry and intact. No rash noted.  Psychiatric: He has a normal mood and affect. His speech is normal and behavior is normal. Thought content normal.    Diabetic foot exam: Normal inspection No skin breakdown Pr ulcerative calluses  Normal DP pulses Deceased  sensation to light touch and monofilament Nails thickened       Assessment & Plan:

## 2014-09-28 ENCOUNTER — Telehealth: Payer: Self-pay | Admitting: Family Medicine

## 2014-09-28 ENCOUNTER — Encounter: Payer: Self-pay | Admitting: *Deleted

## 2014-09-28 ENCOUNTER — Ambulatory Visit (INDEPENDENT_AMBULATORY_CARE_PROVIDER_SITE_OTHER)
Admission: RE | Admit: 2014-09-28 | Discharge: 2014-09-28 | Disposition: A | Discharge status: Home / Self Care | Payer: Medicare Other | Source: Ambulatory Visit | Attending: Family Medicine | Admitting: Family Medicine

## 2014-09-28 DIAGNOSIS — K769 Liver disease, unspecified: Secondary | ICD-10-CM

## 2014-09-28 DIAGNOSIS — K7689 Other specified diseases of liver: Secondary | ICD-10-CM | POA: Diagnosis not present

## 2014-09-28 DIAGNOSIS — R918 Other nonspecific abnormal finding of lung field: Secondary | ICD-10-CM

## 2014-09-28 DIAGNOSIS — R7989 Other specified abnormal findings of blood chemistry: Secondary | ICD-10-CM | POA: Diagnosis not present

## 2014-09-28 DIAGNOSIS — Z8546 Personal history of malignant neoplasm of prostate: Secondary | ICD-10-CM | POA: Diagnosis not present

## 2014-09-28 DIAGNOSIS — R945 Abnormal results of liver function studies: Principal | ICD-10-CM

## 2014-09-28 MED ORDER — IOHEXOL 300 MG/ML  SOLN
100.0000 mL | Freq: Once | INTRAMUSCULAR | Status: AC | PRN
  Administered 2014-09-28: 100 mL via INTRAVENOUS

## 2014-09-28 NOTE — Telephone Encounter (Signed)
Spoke to [place] who states that she received a call from Dr Diona Browner with results

## 2014-09-28 NOTE — Telephone Encounter (Signed)
Discussed results with daughter in detail. Left message with patient  Plan repeat CT chest in 6-12 months, US liver in 6 months to re-eval.

## 2014-09-28 NOTE — Telephone Encounter (Signed)
Noted. Results not back yet.

## 2014-09-28 NOTE — Telephone Encounter (Signed)
Pt's daughter, [name] called and says she took pt for MRI today and they are going to send the results to you today and that you will be able to read them.  She wants to be sure you call her cell once you get the results. [phone], can leave mssg on her v.m., doesn't want to wait all weekend for results.  Thank you.

## 2014-10-01 NOTE — Telephone Encounter (Signed)
Please forward to cardiologist.

## 2014-10-04 ENCOUNTER — Telehealth: Payer: Self-pay

## 2014-10-04 NOTE — Telephone Encounter (Signed)
Per Dr. Haroldine Laws, pt does not need to wait 6 mos for u/s appts. (see note below)

## 2014-10-04 NOTE — Telephone Encounter (Signed)
-----   Message from Scarlette Calico, RN sent at 10/04/2014  2:24 PM EDT ----- Regarding: RE: please advise Hey Dr Haroldine Laws had ordered test due to pt c/o leg pain, ?claudication so he really should have it done sooner than 6 months ----- Message -----    From: Blain Pais    Sent: 10/03/2014  12:25 PM      To: Scarlette Calico, RN Subject: please advise                                  Pt is requesting he put off scheduling his LE Arterial Duplex for 6 months. Please advise if this is ok. Thanks.

## 2014-10-18 ENCOUNTER — Ambulatory Visit: Payer: Medicare Other

## 2014-10-18 ENCOUNTER — Ambulatory Visit (INDEPENDENT_AMBULATORY_CARE_PROVIDER_SITE_OTHER): Payer: Medicare Other

## 2014-10-18 DIAGNOSIS — I739 Peripheral vascular disease, unspecified: Secondary | ICD-10-CM

## 2014-10-22 MED ORDER — POTASSIUM CHLORIDE CRYS ER 20 MEQ PO TBCR: 20.0000 meq | EXTENDED_RELEASE_TABLET | Freq: Two times a day (BID) | ORAL | Status: DC

## 2014-10-22 MED ORDER — EPLERENONE 25 MG PO TABS: 12.5000 mg | ORAL_TABLET | Freq: Every day | ORAL | Status: DC

## 2014-10-22 NOTE — Telephone Encounter (Signed)
Forward to [facility]

## 2014-10-22 NOTE — Telephone Encounter (Signed)
Just received this mess today, called [facility] and sent in new prescriptions with comments on both meds that MD is aware of interaction and is monitoring labs

## 2014-10-22 NOTE — Telephone Encounter (Signed)
Routing to Dr. Clayborne Dana nurse to address with him  (Dr. Caryl Comes is out of town this week).

## 2014-11-09 ENCOUNTER — Telehealth: Payer: Self-pay | Admitting: Family Medicine

## 2014-11-09 ENCOUNTER — Telehealth: Payer: Self-pay | Admitting: *Deleted

## 2014-11-09 NOTE — Telephone Encounter (Signed)
See other phone note from 11/09/2014.

## 2014-11-09 NOTE — Telephone Encounter (Signed)
[name],patient's daughter,returned Donna's call.  [name] said she'll keep her phone with her.

## 2014-11-09 NOTE — Addendum Note (Signed)
Addended by: Carter Kitten on: 11/09/2014 02:18 PM   Modules accepted: Orders, Medications

## 2014-11-09 NOTE — Telephone Encounter (Signed)
Received a drug utilization review from [facility].  States patient is taking both dexilant and pantoprazole.  Per Dr. Diona Browner, patient should only be taking one the these medications.  Spoke with [name] and she wanted to know which medication would Dr. Diona Browner want him to take.  Per Dr. Diona Browner she would recommend taking the pantoprazole 40 mg but if not effective for reflux after 2 weeks, then change to dexilant.  Detailed message left for [name] with these recommendations.

## 2014-11-09 NOTE — Telephone Encounter (Signed)
Spoke with [name].  After looking over her dad's medications, she states he is not taking pantoprazole.  He is currently only taking the Dexilant.  Advised to continue with the [place].

## 2014-11-26 ENCOUNTER — Other Ambulatory Visit: Payer: Self-pay | Admitting: *Deleted

## 2014-11-26 MED ORDER — ALLOPURINOL 100 MG PO TABS: 100.0000 mg | ORAL_TABLET | Freq: Every day | ORAL | Status: DC

## 2014-11-26 NOTE — Telephone Encounter (Signed)
Received faxed refill request from pharmacy Last refill 06/08/14 #360/1 Last office visit 09/27/14 Is it okay to refill medication?

## 2014-11-27 ENCOUNTER — Telehealth: Payer: Self-pay | Admitting: Family Medicine

## 2014-11-27 MED ORDER — GABAPENTIN 300 MG PO CAPS: ORAL_CAPSULE | ORAL | Status: DC

## 2014-11-27 NOTE — Telephone Encounter (Signed)
Patient's daughter,[name], wants Dr.Bedsole  to call her back.  She wants to talk to [facility] before patient's appointment on Friday.  [name] from the beach today and would like to be called today.

## 2014-11-27 NOTE — Telephone Encounter (Signed)
Called [name] to discuss pt care. She and her parents have moved to the beach. Planned to keep me as MD. Difficulty with mood, angry, ornry, pain all over, forgetful, paranoid. [place] to stay at beach living with daughter, she cannot be up here. Pt and wife living on own. He at least wants to stay up here for 1-2 months when he " has things set" Pt being very stubborn.   Daughter is interested in palliative care. He does meet hospice requirements. Pt " tight " with money. Refused to pay for home services.  Will discuss further at upcoming [place].

## 2014-11-30 ENCOUNTER — Encounter: Payer: Self-pay | Admitting: Family Medicine

## 2014-11-30 ENCOUNTER — Ambulatory Visit (INDEPENDENT_AMBULATORY_CARE_PROVIDER_SITE_OTHER): Payer: Medicare Other | Admitting: Family Medicine

## 2014-11-30 VITALS — BP 104/64 | HR 70 | Temp 97.4°F | Ht 67.0 in | Wt 177.2 lb

## 2014-11-30 DIAGNOSIS — R21 Rash and other nonspecific skin eruption: Secondary | ICD-10-CM

## 2014-11-30 DIAGNOSIS — M25562 Pain in left knee: Secondary | ICD-10-CM

## 2014-11-30 DIAGNOSIS — B353 Tinea pedis: Secondary | ICD-10-CM

## 2014-11-30 MED ORDER — CLOTRIMAZOLE 1 % EX CREA: 1.0000 "application " | TOPICAL_CREAM | Freq: Two times a day (BID) | CUTANEOUS | Status: DC

## 2014-11-30 MED ORDER — MELOXICAM 15 MG PO TABS: 15.0000 mg | ORAL_TABLET | Freq: Every day | ORAL | Status: DC

## 2014-11-30 NOTE — Progress Notes (Signed)
   Subjective:    Patient ID: [name], male    DOB: 05/29/1930, 81 y.o.   MRN: [id number]  HPI  81 year old male with hx of lumbar spinal stenosis with chronic leg pain bilatereally, gout, chronic neck issues, afib on asa, COPD and DM presents with new onset pain in left knee in last  Weeks.He has been mowing, trimming bushes. No known injury, no fall.  No popping, no clicking.  Pain on left lateral knee.  More achy after sitting, stiffness. No swelling change, no redness, feels some warmer than other knee. Using tramadol for pain.  He has been using his wheelchair a lot during the day.  He has history of chronic knee issues in left knee, chronically deformed and swollen. Chronically cannot straighten left knee.   He has tramadol for chronic knee pain. In past hydro and oxycodone caused confusion.   He has also noted a rash  On both feet x present for 1-2 years, causing flaking, itching, feet get hot. He has neuropathy.  Has tried OTC cream but never antifungal.   Also dryness at top of buttock crease.. Wipes a lot with baby wipes. Daughter thinks he over cleans.  Review of Systems  Constitutional: Negative for fever and fatigue.  HENT: Negative for ear pain.   Eyes: Negative for pain.  Respiratory: Negative for cough and shortness of breath.   Cardiovascular: Negative for chest pain and palpitations.  Gastrointestinal: Negative for abdominal pain.  Genitourinary: Negative for dysuria.  Musculoskeletal: Positive for joint swelling and gait problem.  Neurological: Negative for syncope, light-headedness and headaches.  Psychiatric/Behavioral: Negative for dysphoric mood.       Objective:   Physical Exam  Constitutional: Vital signs are normal. He appears well-developed and well-nourished.  Elderly male.  HENT:  Head: Normocephalic.  Right Ear: Hearing normal.  Left Ear: Hearing normal.  Nose: Nose normal.  Mouth/Throat: Oropharynx is clear and moist and mucous  membranes are normal.  Neck: Trachea normal. Carotid bruit is not present. No thyroid mass and no thyromegaly present.  Cardiovascular: Normal rate, regular rhythm and normal pulses.  Exam reveals no gallop, no distant heart sounds and no friction rub.   No murmur heard. No peripheral edema  Pulmonary/Chest: Effort normal and breath sounds normal. No respiratory distress.  Musculoskeletal:       Left knee: He exhibits decreased range of motion, swelling and effusion. He exhibits no ecchymosis, no erythema, no bony tenderness, normal meniscus and no MCL laxity. Tenderness found. Medial joint line and lateral joint line tenderness noted. No MCL, no LCL and no patellar tendon tenderness noted.  Skin: Skin is warm, dry and intact. No rash noted.  Erythema at top of gluteal crease, Dry flacky pink skin on bilateral feet, mainly on soles  Psychiatric: He has a normal mood and affect. His speech is normal and behavior is normal. Thought content normal.          Assessment & Plan:

## 2014-11-30 NOTE — Progress Notes (Signed)
Pre visit review using our clinic review tool, if applicable. No additional management support is needed unless otherwise documented below in the visit note. 

## 2014-11-30 NOTE — Patient Instructions (Addendum)
Start fungal cream on feet. Treat shoes with antifungal spray once a week. Start meloxicam for knee pain for the next 3-7 days to help with inflammation. Use tramadol for pain as needed. Limit yard work until knee better.  Can use hydrocortisone cream  1-2 times a day( cortisone 10) on took of buttock crease, cover with Desitin.

## 2014-12-03 ENCOUNTER — Ambulatory Visit (INDEPENDENT_AMBULATORY_CARE_PROVIDER_SITE_OTHER): Payer: Medicare Other | Admitting: *Deleted

## 2014-12-03 DIAGNOSIS — I5022 Chronic systolic (congestive) heart failure: Secondary | ICD-10-CM

## 2014-12-03 DIAGNOSIS — I429 Cardiomyopathy, unspecified: Secondary | ICD-10-CM | POA: Diagnosis not present

## 2014-12-03 DIAGNOSIS — I428 Other cardiomyopathies: Secondary | ICD-10-CM

## 2014-12-04 NOTE — Progress Notes (Signed)
Remote ICD transmission.   

## 2014-12-07 LAB — CUP PACEART REMOTE DEVICE CHECK
Battery Remaining Longevity: 58 mo
Battery Voltage: 2.96 V
HIGH POWER IMPEDANCE MEASURED VALUE: 39 Ohm
HIGH POWER IMPEDANCE MEASURED VALUE: 47 Ohm
Lead Channel Impedance Value: 4047 Ohm
Lead Channel Impedance Value: 456 Ohm
Lead Channel Pacing Threshold Amplitude: 0.75 V
Lead Channel Pacing Threshold Pulse Width: 0.4 ms
Lead Channel Pacing Threshold Pulse Width: 0.8 ms
Lead Channel Sensing Intrinsic Amplitude: 0.375 mV
Lead Channel Sensing Intrinsic Amplitude: 10.125 mV
Lead Channel Setting Pacing Amplitude: 2.5 V
Lead Channel Setting Pacing Pulse Width: 0.4 ms
Lead Channel Setting Sensing Sensitivity: 0.3 mV
MDC IDC MSMT LEADCHNL LV IMPEDANCE VALUE: 4047 Ohm
MDC IDC MSMT LEADCHNL LV IMPEDANCE VALUE: 494 Ohm
MDC IDC MSMT LEADCHNL LV PACING THRESHOLD AMPLITUDE: 2.375 V
MDC IDC MSMT LEADCHNL RA SENSING INTR AMPL: 0.375 mV
MDC IDC MSMT LEADCHNL RV IMPEDANCE VALUE: 380 Ohm
MDC IDC MSMT LEADCHNL RV IMPEDANCE VALUE: 494 Ohm
MDC IDC MSMT LEADCHNL RV SENSING INTR AMPL: 10.125 mV
MDC IDC SESS DTM: 20160822092825
MDC IDC SET LEADCHNL LV PACING PULSEWIDTH: 0.8 ms
MDC IDC SET LEADCHNL RV PACING AMPLITUDE: 2.5 V
MDC IDC SET ZONE DETECTION INTERVAL: 300 ms
MDC IDC SET ZONE DETECTION INTERVAL: 350 ms
MDC IDC SET ZONE DETECTION INTERVAL: 350 ms
MDC IDC SET ZONE DETECTION INTERVAL: 450 ms
MDC IDC STAT BRADY AP VP PERCENT: 0 %
MDC IDC STAT BRADY AP VS PERCENT: 0 %
MDC IDC STAT BRADY AS VP PERCENT: 97.9 %
MDC IDC STAT BRADY AS VS PERCENT: 2.1 %
MDC IDC STAT BRADY RA PERCENT PACED: 0 %
MDC IDC STAT BRADY RV PERCENT PACED: 98.45 %

## 2014-12-11 DIAGNOSIS — M25562 Pain in left knee: Secondary | ICD-10-CM | POA: Insufficient documentation

## 2014-12-11 DIAGNOSIS — B353 Tinea pedis: Secondary | ICD-10-CM | POA: Insufficient documentation

## 2014-12-11 DIAGNOSIS — R21 Rash and other nonspecific skin eruption: Secondary | ICD-10-CM | POA: Insufficient documentation

## 2014-12-11 NOTE — Assessment & Plan Note (Signed)
Treat with topical antifungal. Treat shoes weekly.

## 2014-12-11 NOTE — Assessment & Plan Note (Signed)
Most likely due to OA flare in knee with chronic OA deformity. Start meloxicam for knee pain for the next 3-7 days to help with inflammation. Use tramadol for pain as needed. Limit yard work until knee better.

## 2014-12-11 NOTE — Assessment & Plan Note (Signed)
Appears consistent with allergic derm.. Not consistent with candida in appearance. Treat with hydrocortisone cream and barrier cream.

## 2014-12-21 ENCOUNTER — Encounter: Payer: Self-pay | Admitting: Cardiology

## 2014-12-27 ENCOUNTER — Encounter: Payer: Self-pay | Admitting: Internal Medicine

## 2014-12-31 ENCOUNTER — Encounter: Payer: Self-pay | Admitting: Internal Medicine

## 2014-12-31 ENCOUNTER — Other Ambulatory Visit: Payer: Medicare Other

## 2015-01-03 ENCOUNTER — Encounter: Payer: Medicare Other | Admitting: Family Medicine

## 2015-01-23 ENCOUNTER — Other Ambulatory Visit: Payer: Self-pay | Admitting: *Deleted

## 2015-01-23 MED ORDER — DEXLANSOPRAZOLE 60 MG PO CPDR: 1.0000 | DELAYED_RELEASE_CAPSULE | Freq: Every day | ORAL | Status: DC

## 2015-01-24 MED ORDER — DEXLANSOPRAZOLE 60 MG PO CPDR: 1.0000 | DELAYED_RELEASE_CAPSULE | Freq: Every day | ORAL | Status: DC

## 2015-01-24 NOTE — Addendum Note (Signed)
Addended by: Carter Kitten on: 01/24/2015 10:58 AM   Modules accepted: Orders

## 2015-01-24 NOTE — Telephone Encounter (Signed)
Sent first refill request to wrong pharmacy.  Resent refills to [facility] and called and cancelled refills from yesterday that was sent to [facility].

## 2015-02-22 ENCOUNTER — Other Ambulatory Visit (INDEPENDENT_AMBULATORY_CARE_PROVIDER_SITE_OTHER): Payer: Medicare Other

## 2015-02-22 ENCOUNTER — Telehealth: Payer: Self-pay | Admitting: Family Medicine

## 2015-02-22 DIAGNOSIS — C61 Malignant neoplasm of prostate: Secondary | ICD-10-CM

## 2015-02-22 DIAGNOSIS — D509 Iron deficiency anemia, unspecified: Secondary | ICD-10-CM

## 2015-02-22 DIAGNOSIS — E785 Hyperlipidemia, unspecified: Secondary | ICD-10-CM | POA: Diagnosis not present

## 2015-02-22 DIAGNOSIS — E1140 Type 2 diabetes mellitus with diabetic neuropathy, unspecified: Secondary | ICD-10-CM | POA: Diagnosis not present

## 2015-02-22 DIAGNOSIS — E559 Vitamin D deficiency, unspecified: Secondary | ICD-10-CM

## 2015-02-22 LAB — COMPREHENSIVE METABOLIC PANEL
ALBUMIN: 4.1 g/dL (ref 3.5–5.2)
ALK PHOS: 107 U/L (ref 39–117)
ALT: 13 U/L (ref 0–53)
AST: 18 U/L (ref 0–37)
BILIRUBIN TOTAL: 0.4 mg/dL (ref 0.2–1.2)
BUN: 23 mg/dL (ref 6–23)
CO2: 28 mEq/L (ref 19–32)
Calcium: 9.7 mg/dL (ref 8.4–10.5)
Chloride: 106 mEq/L (ref 96–112)
Creatinine, Ser: 1.19 mg/dL (ref 0.40–1.50)
GFR: 62.16 mL/min (ref >60.00)
Glucose, Bld: 106 mg/dL — ABNORMAL HIGH (ref 70–99)
POTASSIUM: 4.4 meq/L (ref 3.5–5.1)
SODIUM: 142 meq/L (ref 135–145)
TOTAL PROTEIN: 6.7 g/dL (ref 6.0–8.3)

## 2015-02-22 LAB — LIPID PANEL
CHOL/HDL RATIO: 4
Cholesterol: 121 mg/dL (ref 0–200)
HDL: 30.9 mg/dL — ABNORMAL LOW (ref >39.00)
LDL Cholesterol: 55 mg/dL (ref 0–99)
NONHDL: 89.66
Triglycerides: 173 mg/dL — ABNORMAL HIGH (ref 0.0–149.0)
VLDL: 34.6 mg/dL (ref 0.0–40.0)

## 2015-02-22 LAB — HEMOGLOBIN A1C: Hgb A1c MFr Bld: 6.8 % — ABNORMAL HIGH (ref 4.6–6.5)

## 2015-02-22 NOTE — Telephone Encounter (Signed)
-----   Message from Ellamae Sia sent at 02/19/2015 12:35 PM EST ----- Regarding: Lab orders for Friday, 11.11.16 Patient is scheduled for CPX labs, please order future labs, Thanks , Karna Christmas

## 2015-02-25 DIAGNOSIS — N401 Enlarged prostate with lower urinary tract symptoms: Secondary | ICD-10-CM | POA: Diagnosis not present

## 2015-02-25 DIAGNOSIS — R350 Frequency of micturition: Secondary | ICD-10-CM | POA: Diagnosis not present

## 2015-02-25 DIAGNOSIS — C61 Malignant neoplasm of prostate: Secondary | ICD-10-CM | POA: Diagnosis not present

## 2015-02-28 ENCOUNTER — Telehealth: Payer: Self-pay | Admitting: Family Medicine

## 2015-02-28 ENCOUNTER — Ambulatory Visit (INDEPENDENT_AMBULATORY_CARE_PROVIDER_SITE_OTHER): Payer: Medicare Other | Admitting: Family Medicine

## 2015-02-28 ENCOUNTER — Encounter: Payer: Self-pay | Admitting: Family Medicine

## 2015-02-28 VITALS — BP 124/70 | HR 73 | Temp 97.7°F | Ht 67.0 in | Wt 177.8 lb

## 2015-02-28 DIAGNOSIS — I4891 Unspecified atrial fibrillation: Secondary | ICD-10-CM

## 2015-02-28 DIAGNOSIS — E785 Hyperlipidemia, unspecified: Secondary | ICD-10-CM

## 2015-02-28 DIAGNOSIS — F0390 Unspecified dementia without behavioral disturbance: Secondary | ICD-10-CM | POA: Diagnosis not present

## 2015-02-28 DIAGNOSIS — Z0000 Encounter for general adult medical examination without abnormal findings: Secondary | ICD-10-CM

## 2015-02-28 DIAGNOSIS — J449 Chronic obstructive pulmonary disease, unspecified: Secondary | ICD-10-CM | POA: Diagnosis not present

## 2015-02-28 DIAGNOSIS — E1140 Type 2 diabetes mellitus with diabetic neuropathy, unspecified: Secondary | ICD-10-CM

## 2015-02-28 DIAGNOSIS — I10 Essential (primary) hypertension: Secondary | ICD-10-CM

## 2015-02-28 DIAGNOSIS — F03A0 Unspecified dementia, mild, without behavioral disturbance, psychotic disturbance, mood disturbance, and anxiety: Secondary | ICD-10-CM

## 2015-02-28 DIAGNOSIS — Z7189 Other specified counseling: Secondary | ICD-10-CM

## 2015-02-28 LAB — HM DIABETES FOOT EXAM

## 2015-02-28 MED ORDER — DONEPEZIL HCL 5 MG PO TABS: 5.0000 mg | ORAL_TABLET | Freq: Every day | ORAL | Status: DC

## 2015-02-28 NOTE — Assessment & Plan Note (Signed)
HCPOA daughter [name], living will, considering DNR.

## 2015-02-28 NOTE — Assessment & Plan Note (Signed)
Stable control on no med. Encouraged exercise, weight loss, healthy eating habits.  

## 2015-02-28 NOTE — Telephone Encounter (Signed)
Pt's daughter came back in and asked if pt can be seen in May 2016 appt already schedule in coordination with wife's appt. He is not wanting to come back in 3 months. He was wanting to know if you can send in the rx Aricept for 2 refills so he won't run out before he sees you in May. [name] stated she will inform us on how he is doing in the 3 months and if appt is needed at that time. The best number to reach [name] is [phone].

## 2015-02-28 NOTE — Progress Notes (Signed)
Pre visit review using our clinic review tool, if applicable. No additional management support is needed unless otherwise documented below in the visit note. 

## 2015-02-28 NOTE — Patient Instructions (Addendum)
Consider lung cancer screening program.. covered by medicare. Call if interested.  Quit smoking.  Discuss  Code status with family.Timmothy Sours Not Resucitate order.. Call if want to have this done adn I can sign the order.  Start aricept daily.  Follow up in 3 month for memory re-val.  Schedule yearly eye exam if not done in last year.

## 2015-02-28 NOTE — Assessment & Plan Note (Addendum)
Start aricept for memory. Perform MMSE full at next OV.

## 2015-02-28 NOTE — Assessment & Plan Note (Signed)
Rate controlled 

## 2015-02-28 NOTE — Assessment & Plan Note (Signed)
Pt encouraged to quit smoking.  Stable control on spiriva

## 2015-02-28 NOTE — Assessment & Plan Note (Signed)
Well controlled. Continue current medication. Lipitor

## 2015-02-28 NOTE — Progress Notes (Signed)
I have personally reviewed the Medicare Annual Wellness questionnaire and have noted 1. The patient's medical and social history 2. Their use of alcohol, tobacco or illicit drugs 3. Their current medications and supplements 4. The patient's functional ability including ADL's, fall risks, home safety risks and hearing or visual             impairment. 5. Diet and physical activities 6. Evidence for depression or mood disorders 7.         Updated provider list Cognitive evaluation was performed and recorded on pt medicare questionnaire form. The patients weight, height, BMI and visual acuity have been recorded in the chart  I have made referrals, counseling and provided education to the patient based review of the above and I have provided the pt with a written personalized care plan for preventive services.   Afib, polymorphic ventricular tachycardia, CHF, Has defib: followed by cardiology  He is caring for his wife. He feels overwhelmed at times. [place]oved to beach. Daughter wanted me to discuss med, he is not interested.  DM On no med. FBS 100-120, never < 60, never > 200 Lab Results  Component Value Date   HGBA1C 6.8* 02/22/2015   Elevated Cholesterol:  LDL at goal on liptor   Lab Results  Component Value Date   CHOL 121 02/22/2015   HDL 30.90* 02/22/2015   LDLCALC 55 02/22/2015   LDLDIRECT 44.5 05/25/2012   TRIG 173.0* 02/22/2015   CHOLHDL 4 02/22/2015  Using medications without problems:No Muscle aches: NO Diet compliance: Good, too many cookies Exercise:none, limited. Other complaints:  Gout:  No flares on allopurinol 100 mg daily  COPD, stable on current meds. Restarted smoking. Precontemplative. SOB on spiriva.  BPH: on avodart and flomax Prostate adenocarcinoma Followed by [facility]. No concerns.  Hypertension:  BP Readings from Last 3 Encounters:  02/28/15 124/70  11/30/14 104/64  09/27/14 118/60  Using medication without problems or lightheadedness:  None Chest pain with exertion:None Edema:Occ Short of breath: stable Average home BPs: good control Other issues:    Review of Systems  Constitutional: Negative for fever and fatigue.  HENT: Negative for ear pain.  Eyes: Negative for pain.  Respiratory: Negative for shortness of breath.  Cardiovascular: Negative for chest pain, palpitations and leg swelling.  Gastrointestinal: Negative for abdominal pain.       Objective:   Physical Exam  Constitutional: Vital signs are normal. He appears well-developed and well-nourished.  HENT:  Head: Normocephalic.  Right Ear: Hearing normal.  Left Ear: Hearing normal.  Nose: Nose normal.  Mouth/Throat: Oropharynx is clear and moist and mucous membranes are normal.  Neck: Trachea normal. Carotid bruit is not present. No mass and no thyromegaly present.  Cardiovascular: Normal rate, regular rhythm and normal pulses. Exam reveals distant heart sounds. Exam reveals no gallop and no friction rub.  No murmur heard. No peripheral edema  Pulmonary/Chest: Effort normal and breath sounds normal. No respiratory distress.  Skin: Skin is warm, dry and intact. No rash noted.  Psychiatric: He has a normal mood and affect. His speech is normal and behavior is normal. Thought content normal.    Diabetic foot exam: Normal inspection No skin breakdown No calluses  Normal DP pulses Normal sensation to light touch and monofilament Nails thickened       Assessment & Plan:  The patient's preventative maintenance and recommended screening tests for an annual wellness exam were reviewed in full today. Brought up to date unless services declined.  Counselled on the  importance of diet, exercise, and its role in overall health and mortality. The patient's FH and SH was reviewed, including their home life, tobacco status, and drug and alcohol status.   Vaccines:uptodate except for prevnar and flu COLON: not indicated PSA followed by  urologist Hx of prostate cancer Current smoking, > 60 years.  Refused lung cancer screening program. HCPOA: [name], daughter

## 2015-02-28 NOTE — Assessment & Plan Note (Signed)
Well controlled. Continue current medication.  

## 2015-03-04 ENCOUNTER — Ambulatory Visit (INDEPENDENT_AMBULATORY_CARE_PROVIDER_SITE_OTHER): Payer: Medicare Other | Admitting: Internal Medicine

## 2015-03-04 ENCOUNTER — Encounter: Payer: Self-pay | Admitting: Internal Medicine

## 2015-03-04 VITALS — BP 106/66 | HR 79 | Ht 67.0 in | Wt 186.4 lb

## 2015-03-04 DIAGNOSIS — Z95810 Presence of automatic (implantable) cardiac defibrillator: Secondary | ICD-10-CM | POA: Diagnosis not present

## 2015-03-04 DIAGNOSIS — I5032 Chronic diastolic (congestive) heart failure: Secondary | ICD-10-CM

## 2015-03-04 DIAGNOSIS — I4891 Unspecified atrial fibrillation: Secondary | ICD-10-CM

## 2015-03-04 DIAGNOSIS — I4901 Ventricular fibrillation: Secondary | ICD-10-CM

## 2015-03-04 LAB — CUP PACEART INCLINIC DEVICE CHECK
Brady Statistic AP VS Percent: 0 %
Brady Statistic AS VP Percent: 97.6 %
Implantable Lead Implant Date: 20080513
Implantable Lead Implant Date: 20080513
Implantable Lead Implant Date: 20080513
Implantable Lead Model: 5076
Implantable Lead Model: 6947
Lead Channel Impedance Value: 494 Ohm
Lead Channel Impedance Value: 494 Ohm
Lead Channel Impedance Value: 589 Ohm
Lead Channel Pacing Threshold Pulse Width: 0.4 ms
Lead Channel Pacing Threshold Pulse Width: 0.8 ms
Lead Channel Sensing Intrinsic Amplitude: 8.6 mV
Lead Channel Setting Pacing Amplitude: 2.5 V
Lead Channel Setting Pacing Pulse Width: 0.4 ms
Lead Channel Setting Pacing Pulse Width: 0.8 ms
MDC IDC LEAD LOCATION: 753858
MDC IDC LEAD LOCATION: 753859
MDC IDC LEAD LOCATION: 753860
MDC IDC MSMT BATTERY REMAINING LONGEVITY: 51 mo
MDC IDC MSMT LEADCHNL LV PACING THRESHOLD AMPLITUDE: 1.5 V
MDC IDC MSMT LEADCHNL RA SENSING INTR AMPL: 0.4 mV
MDC IDC MSMT LEADCHNL RV PACING THRESHOLD AMPLITUDE: 0.75 V
MDC IDC SESS DTM: 20161123133100
MDC IDC SET LEADCHNL LV PACING AMPLITUDE: 2.5 V
MDC IDC SET LEADCHNL RV SENSING SENSITIVITY: 0.3 mV
MDC IDC STAT BRADY AP VP PERCENT: 0 %
MDC IDC STAT BRADY AS VS PERCENT: 2.4 %

## 2015-03-04 NOTE — Patient Instructions (Signed)
Medication Instructions: - no changes  Labwork: - none  Procedures/Testing: - none  Follow-Up: - Remote monitoring is used to monitor your Pacemaker of ICD from home. This monitoring reduces the number of office visits required to check your device to one time per year. It allows [place] to keep an eye on the functioning of your device to ensure it is working properly. You are scheduled for a device check from home on 06/03/15. You may send your transmission at any time that day. If you have a wireless device, the transmission will be sent automatically. After your physician reviews your transmission, you will receive a postcard with your next transmission date.  - Your physician wants you to follow-up in: 1 year with Dr. Caryl Comes. You will receive a reminder letter in the mail two months in advance. If you don't receive a letter, please call our office to schedule the follow-up appointment.  Any Additional Special Instructions Will Be Listed Below (If Applicable). - none

## 2015-03-04 NOTE — Progress Notes (Signed)
Patient Care Team: Jinny Sanders, MD as PCP - General (Family Medicine) Jolaine Artist, MD (Cardiology) Jolaine Artist, MD (Cardiology) Deboraha Sprang, MD (Cardiology)   HPI  [name] is a 81 y.o. male  Seen in followup for CRT-D implantation in the setting of nonischemic myopathy and atrial fibrillation. Previously he was on warfarin. This was discontinued because of GI bleeding and he has been treated with aspirin  He is status post AV junction ablation. He has had intercurrent normalization of LV systolic function.  November 2010 he had syncope with appropriate VF therapy For polymorphic ventricular tachycardia he has some lightheadedness with standing but no syncope His lisinopril was decreased 10>>5   His device reached ERI 10/14 and he underwent generator replacement as an ICD CRT at that time. The patient denies chest pain, shortness of breath, nocturnal dyspnea, orthopnea or peripheral edema.  There have been no palpitations, lightheadedness or syncope.  He mostly complains of weakness in his legs  He and his daughter both think he is doing greta         Past Medical History  Diagnosis Date  . COPD (chronic obstructive pulmonary disease) (HCC)     GOLD II; Spirometry 07/10/2008 >FEV1 1.46 56% predicted, ratio of 66%  . Anemia     iron deficiency anemia with previous severe GI bleed   . Hypertension   . Hyperlipidemia   . Left bundle branch block   . Prostatic hypertrophy 09-11-97    Benign  . Obesity   . CHF (congestive heart failure) (HCC)     secondary to nonischemic cardiomyopathy; ECHO 10/07 EF 20-25%, mod to severe MR; ECHO 1/10 EF 55-60%, mild MR; ECHO 2/11 55-65%, grade 1 diast dysfxn, miod to mod LAE, mild TR;    S/P Medtronic BiV ICD with biventricular function now turned off due to diahragmatic simulation  . CAD (coronary artery disease)     Mild, nonobstructive (LHC 1/07: mLAD 20%, pCFX 20-30%, mRCA 30%, EF 25%)  . Atrial fibrillation ([place])      s/p AV node ablation; not on coumadin due to GIB  . Cardiac arrest - ventricular fibrillation 02/2008    Aborted, shocked by ICD  . Dyspnea     exertional  . Pneumonia     01/2011  . Diabetes mellitus     type 2 NIDDM x 5-6 yrs  . GERD (gastroesophageal reflux disease)   . Cancer [facility])     prostate  . Arthritis     back and knees  . 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 Inguinal hernia     left; asymptomatic  . Chronic neck pain   . Cervical radiculopathy     left  . Torticollis     Past Surgical History  Procedure Laterality Date  . Lobectomy  01-21-98    lung  . Rotator cuff repair  1994 and 1995  . Knee surgery  1996    Left  . Cardiac catheterization  01/2006    Showed mild nonobstructive CAD  . Cardiac catheterization  11/05/2006    Right atrial pressure mean of 12, RV pressure 36/8, PA pressure 39/16 witha mean of 28, wedge pressure was 20. Fick cardiac output was 5 liters per minute, cardiac index was 2.4  . Esophagogastroduodenoscopy  09/1997    Prepylor ulcer, esoph ring, duod avm  . Colonoscopy w/ polypectomy  11/1997    Divertics, int hemms  .  Mch gi bleed  02/07-02/12/2002  . Esophagogastroduodenoscopy  05/22/2002    Poss Barrett's   . Colonoscopy  06/26/2002    Multip (neg) divertics, int hemm  . Colonoscopy  07/10/2004    Poyps, divertics, int hemms  . Mch sob  10/11-10/18/2007    A fib, CHF  . Esophagogastroduodenoscopy  02/19/2006    HH No active bleeding  . Ct head limited w/o cm  10/03/2006    No acute abnmlty  . Coronary angioplasty      Min abstrut zd severe LB dystn EF 25%  . Mch x  11/08-11/03/2006    Acute blood loss, anemia, sys HF, isch cardiomyopathy  . Hosp  8/14-8/23/2008    Acute on chronis CHF IIIB NOnisch Cardiomyop EF 20-25% Mod-Sev MR  . Colonoscopy  06/19/2008    2 polyps divertics int hemms (Dr Henrene Pastor)  . Insert / replace / remove pacemaker  2007  . Eye surgery      cataract, bilateral  .  Anterior cervical discectomy  02/2010  . Knee arthroscopy  1988  . Lumbar laminectomy/decompression microdiscectomy  04/29/2011    Procedure: LUMBAR LAMINECTOMY/DECOMPRESSION MICRODISCECTOMY;  Surgeon: Eustace Moore, MD;  Location: [place] NEURO ORS;  Service: Neurosurgery;  Laterality: N/A;  Lumbar Two, Three, Four, Five Decompressive Lumbar Laminectomies  . Biv icd genertaor change out N/A 02/08/2013    Procedure: BIV ICD GENERTAOR CHANGE OUT;  Surgeon: Deboraha Sprang, MD;  Location: [facility] CATH LAB;  Service: Cardiovascular;  Laterality: N/A;    Current Outpatient Prescriptions  Medication Sig Dispense Refill  . acetaminophen (TYLENOL) 500 MG tablet Take 500 mg by mouth every 6 (six) hours as needed for mild pain.     Marland Kitchen allopurinol (ZYLOPRIM) 100 MG tablet Take 1 tablet (100 mg total) by mouth daily. 90 tablet 1  . aspirin 81 MG tablet Take 81 mg by mouth 2 (two) times daily.    Marland Kitchen atorvastatin (LIPITOR) 20 MG tablet TAKE 1 TABLET DAILY AT     BEDTIME 90 tablet 3  . AVODART 0.5 MG capsule TAKE 1 CAPSULE DAILY 90 capsule 1  . carvedilol (COREG) 3.125 MG tablet TAKE 1 TABLET TWICE A DAY  WITH MEALS 180 tablet 3  . Cholecalciferol (TH VITAMIN D3) 2000 UNITS CAPS Take 2,000 Units by mouth daily.     . clotrimazole (LOTRIMIN) 1 % cream Apply 1 application topically 2 (two) times daily. 30 g 0  . dexlansoprazole (DEXILANT) 60 MG capsule Take 1 capsule (60 mg total) by mouth daily. 90 capsule 3  . donepezil (ARICEPT) 5 MG tablet Take 1 tablet (5 mg total) by mouth at bedtime. 90 tablet 3  . eplerenone (INSPRA) 25 MG tablet Take 0.5 tablets (12.5 mg total) by mouth daily. 45 tablet 3  . furosemide (LASIX) 40 MG tablet TAKE 1 TABLET DAILY 90 tablet 3  . gabapentin (NEURONTIN) 300 MG capsule TAKE 1 CAPSULE 4 TIMES     DAILY 360 capsule 1  . guaiFENesin (MUCINEX) 600 MG 12 hr tablet Take 1,200 mg by mouth 2 (two) times daily as needed for cough.     Marland Kitchen lisinopril (PRINIVIL,ZESTRIL) 2.5 MG tablet Take 1 tablet  (2.5 mg total) by mouth daily. 90 tablet 3  . meloxicam (MOBIC) 15 MG tablet Take 1 tablet (15 mg total) by mouth daily. 30 tablet 0  . potassium chloride SA (KLOR-CON M20) 20 MEQ tablet Take 1 tablet (20 mEq total) by mouth 2 (two) times daily. 180 tablet 3  .  PROAIR HFA 108 (90 BASE) MCG/ACT inhaler FOR INSTRUCTIONS ON THE    PROPER DOSING OF YOUR      MEDICATION REFER TO YOUR   PARTICIPANT COUNSELING FORM 25.5 g 3  . tamsulosin (FLOMAX) 0.4 MG CAPS capsule Take 0.4 mg by mouth daily after supper.    . tiotropium (SPIRIVA HANDIHALER) 18 MCG inhalation capsule INHALE THE CONTENTS OF ONE CAPSULE DAILY IN THE       MORNING VIA HANDIHALER     DEVICE 90 capsule 1  . traMADol (ULTRAM) 50 MG tablet Take 2 tablets (100 mg total) by mouth every 6 (six) hours as needed. (Patient taking differently: Take 100 mg by mouth every 6 (six) hours as needed for moderate pain. ) 120 tablet 0   No current facility-administered medications for this visit.    No Known Allergies  Review of Systems negative except from HPI and PMH  Physical Exam BP 106/66 mmHg  Pulse 79  Ht 5\' 7"  (1.702 m)  Wt 186 lb 6.4 oz (84.55 kg)  BMI 29.19 kg/m2 Well developed and well nourished in no acute distress HENT normal E scleral and icterus clear Neck Supple JVP flat; carotids brisk and full Clear to ausculation Device pocket well healed; without hematoma or erythema.  There is no tethering Regular rate and rhythm, no murmurs gallops or rub Soft with active bowel sounds No clubbing cyanosis none Edema Alert and oriented, grossly normal motor and sensory function Skin Warm and Dry  ECG demonstrates atrial fibrillation and ventricular pacing with an upright QRS duration in lead V1  Assessment and  Plan  Atrial fibrillation-permanent  Cardiomyopathy-resolved  CRT-D-Medtronic  Complete heart block  Blood work was checked 11/16 and was normal   He is disinclined to consider anticoagulation; he will continue on  aspirin; he had GI bleeding on warfarin  Heart failure symptoms are stable

## 2015-03-13 ENCOUNTER — Other Ambulatory Visit: Payer: Self-pay | Admitting: Family Medicine

## 2015-03-13 ENCOUNTER — Telehealth: Payer: Self-pay | Admitting: Family Medicine

## 2015-03-13 NOTE — Telephone Encounter (Signed)
[name] wanted to know if you received a fax from hover round

## 2015-03-14 ENCOUNTER — Encounter: Payer: Self-pay | Admitting: Family Medicine

## 2015-03-14 ENCOUNTER — Ambulatory Visit (INDEPENDENT_AMBULATORY_CARE_PROVIDER_SITE_OTHER): Payer: Medicare Other | Admitting: Family Medicine

## 2015-03-14 VITALS — BP 103/53 | HR 73 | Temp 97.8°F | Ht 67.0 in | Wt 183.8 lb

## 2015-03-14 DIAGNOSIS — Z23 Encounter for immunization: Secondary | ICD-10-CM

## 2015-03-14 DIAGNOSIS — R2689 Other abnormalities of gait and mobility: Secondary | ICD-10-CM | POA: Insufficient documentation

## 2015-03-14 NOTE — Assessment & Plan Note (Signed)
He continues to use and require the Hover[employer] throughout the day.

## 2015-03-14 NOTE — Progress Notes (Addendum)
   Subjective:    Patient ID: [name], male    DOB: 11/15/1930, 81 y.o.   MRN: [id number]  HPI  81year old male presents for mobility assessment for medical necessity of continued use of Hover[employer].   He has history of spinal stenosis in cervical region, had complications of surgery with blood clot.  Since then he has had weakness in upper and lower legs. Decreased griop strength on right. Cannot open bottles. Chronic pain in legs.  He has chronic shortness of breath with CHF and COPD.  THE PATIENT CONTINUES TO USE THE POWER WHEELCHAIR. He uses [employer] 12 hours a day at home.    Review of Systems  Constitutional: Positive for fatigue. Negative for fever.  HENT: Negative for ear pain.   Eyes: Negative for pain.  Respiratory: Negative for cough.   Cardiovascular: Negative for chest pain.       Objective:   Physical Exam  Constitutional: Vital signs are normal. He appears well-developed and well-nourished.  HENT:  Head: Normocephalic.  Right Ear: Hearing normal.  Left Ear: Hearing normal.  Nose: Nose normal.  Mouth/Throat: Oropharynx is clear and moist and mucous membranes are normal.  Neck: Trachea normal. Carotid bruit is not present. No thyroid mass and no thyromegaly present.  Cardiovascular: Normal rate, regular rhythm and normal pulses.  Exam reveals no gallop, no distant heart sounds and no friction rub.   No murmur heard. No peripheral edema  Pulmonary/Chest: Effort normal and breath sounds normal. No respiratory distress.  Musculoskeletal:  Arthritis deformity in bilateral hands  Neurological: He is alert. He is not disoriented. He displays atrophy. No cranial nerve deficit or sensory deficit. He exhibits abnormal muscle tone. Coordination and gait abnormal.    Strength bilateral upper ext: 4/5 in shoulder abd, add 4/5 wrist flex ext, 3/5 on right grip, 4/5 on left Strength bilateral lower ext: 5/5 thorughout   Skin: Skin is warm, dry and intact. No  rash noted.  Psychiatric: He has a normal mood and affect. His speech is normal and behavior is normal. Thought content normal.          Assessment & Plan:

## 2015-03-14 NOTE — Progress Notes (Signed)
Pre visit review using our clinic review tool, if applicable. No additional management support is needed unless otherwise documented below in the visit note. 

## 2015-03-15 ENCOUNTER — Telehealth: Payer: Self-pay | Admitting: Family Medicine

## 2015-03-15 NOTE — Telephone Encounter (Signed)
Pt dropped off renewal for disability parking placard. Best number to call is daughter [name]) at [phone] when finished. Placing ppw in Dr. Rometta Emery rx tower.

## 2015-03-15 NOTE — Telephone Encounter (Signed)
Handicap Placard Application placed in Dr. Bedsole's in box for signature. 

## 2015-03-27 ENCOUNTER — Encounter: Payer: Self-pay | Admitting: Internal Medicine

## 2015-04-11 ENCOUNTER — Ambulatory Visit (INDEPENDENT_AMBULATORY_CARE_PROVIDER_SITE_OTHER): Payer: Medicare Other | Admitting: Primary Care

## 2015-04-11 ENCOUNTER — Encounter: Payer: Self-pay | Admitting: Primary Care

## 2015-04-11 VITALS — BP 138/84 | HR 90 | Temp 97.6°F | Wt 188.5 lb

## 2015-04-11 DIAGNOSIS — R059 Cough, unspecified: Secondary | ICD-10-CM

## 2015-04-11 DIAGNOSIS — R05 Cough: Secondary | ICD-10-CM | POA: Diagnosis not present

## 2015-04-11 MED ORDER — AZITHROMYCIN 250 MG PO TABS: ORAL_TABLET | ORAL | Status: DC

## 2015-04-11 NOTE — Progress Notes (Signed)
Pre visit review using our clinic review tool, if applicable. No additional management support is needed unless otherwise documented below in the visit note. 

## 2015-04-11 NOTE — Patient Instructions (Signed)
Start Azithromycin antibiotics. Take 2 tablets by mouth today, then 1 tablet daily for 4 additional days.  Increase consumption of water and rest.  Please call me if you start to feel worse, develop fevers, start to feel weak.  It was a pleasure meeting you!

## 2015-04-11 NOTE — Progress Notes (Signed)
Subjective:    Patient ID: [name], male    DOB: 28-May-1930, 81 y.o.   MRN: [id number]  HPI  [name] is an 81 year old male who presents today with a chief complaint of cough. He also reports nasal congestion, sinus pressure, headaches, fatigue, chills. His symptoms have been present for the past 2 weeks. His cough is slightly productive and he's experienced a lot of rhinorrhea.   Over the past several days he's felt an increase in fatigue. His wife was diagnosed with bronchitis yesterday and is currently on treatment with antibiotics. He's taken Mucinex DM and Flonase with some improvement. Denies fevers.   Review of Systems  Constitutional: Positive for chills and fatigue. Negative for fever.  HENT: Positive for congestion, rhinorrhea and sinus pressure. Negative for sore throat.   Respiratory: Positive for cough.   Neurological: Positive for headaches.       Past Medical History  Diagnosis Date  . COPD (chronic obstructive pulmonary disease) (HCC)     GOLD II; Spirometry 07/10/2008 >FEV1 1.46 56% predicted, ratio of 66%  . Anemia     iron deficiency anemia with previous severe GI bleed   . Hypertension   . Hyperlipidemia   . Left bundle branch block   . Prostatic hypertrophy 09-11-97    Benign  . Obesity   . CHF (congestive heart failure) (HCC)     secondary to nonischemic cardiomyopathy; ECHO 10/07 EF 20-25%, mod to severe MR; ECHO 1/10 EF 55-60%, mild MR; ECHO 2/11 55-65%, grade 1 diast dysfxn, miod to mod LAE, mild TR;    S/P Medtronic BiV ICD with biventricular function now turned off due to diahragmatic simulation  . CAD (coronary artery disease)     Mild, nonobstructive (LHC 1/07: mLAD 20%, pCFX 20-30%, mRCA 30%, EF 25%)  . Atrial fibrillation ([place])     s/p AV node ablation; not on coumadin due to GIB  . Cardiac arrest - ventricular fibrillation 02/2008    Aborted, shocked by ICD  . Dyspnea     exertional  . Pneumonia     01/2011  . Diabetes mellitus      type 2 NIDDM x 5-6 yrs  . GERD (gastroesophageal reflux disease)   . Cancer [facility])     prostate  . Arthritis     back and knees  . 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 Inguinal hernia     left; asymptomatic  . Chronic neck pain   . Cervical radiculopathy     left  . Torticollis     Social History   Social History  . Marital Status: Married    Spouse Name: N/A  . Number of Children: N/A  . Years of Education: N/A   Occupational History  . [employer]    Retired  .     Social History Main Topics  . Smoking status: Current Every Day Smoker -- 1.00 packs/day for 60 years    Types: Cigarettes  . Smokeless tobacco: Never Used     Comment: Smoker since 81, quit for 2 years and started back in 03/2008  . Alcohol Use: 0.0 oz/week    0 Standard drinks or equivalent per week  . Drug Use: No  . Sexual Activity: Not on file   Other Topics Concern  . Not on file   Social History Narrative   Has living will: Full Code.   HCPOA: wife and daughter  Married, lives with wife and 1 adopted child    Past Surgical History  Procedure Laterality Date  . Lobectomy  01-21-98    lung  . Rotator cuff repair  1994 and 1995  . Knee surgery  1996    Left  . Cardiac catheterization  01/2006    Showed mild nonobstructive CAD  . Cardiac catheterization  11/05/2006    Right atrial pressure mean of 12, RV pressure 36/8, PA pressure 39/16 witha mean of 28, wedge pressure was 20. Fick cardiac output was 5 liters per minute, cardiac index was 2.4  . Esophagogastroduodenoscopy  09/1997    Prepylor ulcer, esoph ring, duod avm  . Colonoscopy w/ polypectomy  11/1997    Divertics, int hemms  . Mch gi bleed  02/07-02/12/2002  . Esophagogastroduodenoscopy  05/22/2002    Poss Barrett's   . Colonoscopy  06/26/2002    Multip (neg) divertics, int hemm  . Colonoscopy  07/10/2004    Poyps, divertics, int hemms  . Mch sob  10/11-10/18/2007    A fib, CHF  .  Esophagogastroduodenoscopy  02/19/2006    HH No active bleeding  . Ct head limited w/o cm  10/03/2006    No acute abnmlty  . Coronary angioplasty      Min abstrut zd severe LB dystn EF 25%  . Mch x  11/08-11/03/2006    Acute blood loss, anemia, sys HF, isch cardiomyopathy  . Hosp  8/14-8/23/2008    Acute on chronis CHF IIIB NOnisch Cardiomyop EF 20-25% Mod-Sev MR  . Colonoscopy  06/19/2008    2 polyps divertics int hemms (Dr Henrene Pastor)  . Insert / replace / remove pacemaker  2007  . Eye surgery      cataract, bilateral  . Anterior cervical discectomy  02/2010  . Knee arthroscopy  1988  . Lumbar laminectomy/decompression microdiscectomy  04/29/2011    Procedure: LUMBAR LAMINECTOMY/DECOMPRESSION MICRODISCECTOMY;  Surgeon: Eustace Moore, MD;  Location: [facility];  Service: Neurosurgery;  Laterality: N/A;  Lumbar Two, Three, Four, Five Decompressive Lumbar Laminectomies  . Biv icd genertaor change out N/A 02/08/2013    Procedure: BIV ICD GENERTAOR CHANGE OUT;  Surgeon: Deboraha Sprang, MD;  Location: [facility] CATH LAB;  Service: Cardiovascular;  Laterality: N/A;    Family History  Problem Relation Age of Onset  . Heart failure Mother     CHF  . Heart failure Father     CHF  . Hypertension Sister   . Obesity Sister   . Cancer Brother     lung cancer  . Lung cancer Brother   . Liver disease Brother     ETOH  . Alcohol abuse Brother   . Cancer Brother   . COPD Brother   . Cancer Brother   . Anxiety disorder Brother   . COPD Brother   . Hypertension Sister     No Known Allergies  Current Outpatient Prescriptions on File Prior to Visit  Medication Sig Dispense Refill  . acetaminophen (TYLENOL) 500 MG tablet Take 500 mg by mouth every 6 (six) hours as needed for mild pain.     Marland Kitchen allopurinol (ZYLOPRIM) 100 MG tablet Take 1 tablet (100 mg total) by mouth daily. 90 tablet 1  . aspirin 81 MG tablet Take 81 mg by mouth 2 (two) times daily.    Marland Kitchen atorvastatin (LIPITOR) 20 MG tablet TAKE 1  TABLET DAILY AT     BEDTIME 90 tablet 3  . AVODART 0.5 MG capsule TAKE 1  CAPSULE DAILY 90 capsule 1  . carvedilol (COREG) 3.125 MG tablet TAKE 1 TABLET TWICE A DAY  WITH MEALS 180 tablet 3  . Cholecalciferol (TH VITAMIN D3) 2000 UNITS CAPS Take 2,000 Units by mouth daily.     . clotrimazole (LOTRIMIN) 1 % cream Apply 1 application topically 2 (two) times daily. 30 g 0  . dexlansoprazole (DEXILANT) 60 MG capsule Take 1 capsule (60 mg total) by mouth daily. 90 capsule 3  . donepezil (ARICEPT) 5 MG tablet Take 1 tablet (5 mg total) by mouth at bedtime. 90 tablet 3  . eplerenone (INSPRA) 25 MG tablet Take 0.5 tablets (12.5 mg total) by mouth daily. 45 tablet 3  . furosemide (LASIX) 40 MG tablet TAKE 1 TABLET DAILY 90 tablet 3  . gabapentin (NEURONTIN) 300 MG capsule TAKE 1 CAPSULE 4 TIMES     DAILY 360 capsule 1  . guaiFENesin (MUCINEX) 600 MG 12 hr tablet Take 1,200 mg by mouth 2 (two) times daily as needed for cough.     Marland Kitchen lisinopril (PRINIVIL,ZESTRIL) 2.5 MG tablet Take 1 tablet (2.5 mg total) by mouth daily. 90 tablet 3  . meloxicam (MOBIC) 15 MG tablet Take 1 tablet (15 mg total) by mouth daily. 30 tablet 0  . potassium chloride SA (KLOR-CON M20) 20 MEQ tablet Take 1 tablet (20 mEq total) by mouth 2 (two) times daily. 180 tablet 3  . PROAIR HFA 108 (90 BASE) MCG/ACT inhaler FOR INSTRUCTIONS ON THE    PROPER DOSING OF YOUR      MEDICATION REFER TO YOUR   PARTICIPANT COUNSELING FORM 25.5 g 3  . SPIRIVA HANDIHALER 18 MCG inhalation capsule INHALE THE CONTENTS OF ONE CAPSULE DAILY IN THE       MORNING VIA HANDHIHALER    DEVICE 90 capsule 3  . tamsulosin (FLOMAX) 0.4 MG CAPS capsule Take 0.4 mg by mouth daily after supper.    . traMADol (ULTRAM) 50 MG tablet Take 2 tablets (100 mg total) by mouth every 6 (six) hours as needed. (Patient taking differently: Take 100 mg by mouth every 6 (six) hours as needed for moderate pain. ) 120 tablet 0   No current facility-administered medications on file prior to  visit.    BP 138/84 mmHg  Pulse 90  Temp(Src) 97.6 F (36.4 C) (Oral)  Wt 188 lb 8 oz (85.503 kg)  SpO2 99%    Objective:   Physical Exam  Constitutional: He appears well-nourished.  HENT:  Right Ear: Tympanic membrane and ear canal normal.  Left Ear: Tympanic membrane and ear canal normal.  Nose: Mucosal edema present. Right sinus exhibits no maxillary sinus tenderness and no frontal sinus tenderness. Left sinus exhibits no maxillary sinus tenderness and no frontal sinus tenderness.  Mouth/Throat: Oropharynx is clear and moist.  Eyes: Conjunctivae are normal.  Neck: Neck supple.  Cardiovascular: Normal rate and regular rhythm.   Pulmonary/Chest: Effort normal. He has rales.  Lymphadenopathy:    He has no cervical adenopathy.  Skin: Skin is warm and dry.          Assessment & Plan:  URI:  Cough, fatigue, weakness, rhinorrhea x 2 weeks, gradually feeling more fatigued. Exam with mild rales to upper lobes, otherwise unremarkable. Given recent diagnosis of wife, increased fatigue, will cover with treatment. RX for zpak sent to pharmacy.  Discussed water consumption, rest, flonase for rhinorrhea. Return precautions provided.

## 2015-05-01 ENCOUNTER — Telehealth: Payer: Self-pay | Admitting: Family Medicine

## 2015-05-01 NOTE — Telephone Encounter (Addendum)
[name] notified as instructed by telephone. 

## 2015-05-01 NOTE — Telephone Encounter (Signed)
[name] notified that per Dr Lorelei Pont her dad needs to be evaluated prior to giving antibiotics.  In an 82 yo with fever, possible uti source, cannot exclude pyelo or other problem - recommend seeing a physician / urgent care center today.

## 2015-05-01 NOTE — Telephone Encounter (Signed)
[name] called stating [facility] and mr Facchini tested positive for uti twice she wanted to know if you would call him in something they are out of town She would like [facility] [place]

## 2015-05-01 NOTE — Telephone Encounter (Signed)
Patient Name: DEJAUN VIDRIO  DOB: 06/21/1930    Initial Comment caller states [place] - thinks he has a UTI - can she get an rx called in   Nurse Assessment  Nurse: Mallie Mussel, RN, Alveta Heimlich Date/Time Eilene Ghazi Time): 05/01/2015 11:30:00 AM  Confirm and document reason for call. If symptomatic, describe symptoms. You must click the next button to save text entered. ---Caller states that her father [place] with [place] beach. She is pretty sure he has a UTI. She bought a Radio producer from [employer] and the readings are off the scale. He has a fever, but she has not checked his temperature. He has pain of his back but not on the sides. Denies burning/pain with urination, but he is experiencing frequency. Denies blood in his urine.  Has the patient traveled out of the country within the last 30 days? ---No  Does the patient have any new or worsening symptoms? ---Yes  Will a triage be completed? ---Yes  Related visit to physician within the last 2 weeks? ---No  Does the PT have any chronic conditions? (i.e. diabetes, asthma, etc.) ---Yes  List chronic conditions. ---HTN, Hypercholesterolemia, CHF, Pacemaker/Defibrillator  Is this a behavioral health or substance abuse call? ---No     Guidelines    Guideline Title Affirmed Question Affirmed Notes  Urinary Symptoms Fever > 100.5 F (38.1 C)    Final Disposition User   See Physician within 4 Hours (or PCP triage) Mallie Mussel, RN, [place] states that she is sure he has a fever, he has chills and doesn't ever feel like this.  Caller states that she wants the doctor go call something in for him. I called the backline and spoke with Robin. I did a warm transfer to Shirlean Mylar for further assistance.   Referrals  GO TO FACILITY REFUSED   Disagree/Comply: Disagree  Disagree/Comply Reason: Disagree with instructions

## 2015-05-01 NOTE — Telephone Encounter (Signed)
My practice pattern would be to recommend a face to face evaluation in an 82 yo with fever, possible uti source, cannot exclude pyelo or other problem - recommend seeing a physician / urgent care center today.

## 2015-05-01 NOTE — Telephone Encounter (Signed)
[name] called again to find out if patient can get a prescription.  Patient's not feeling well and they're 20 miles from the nearest urgent care.  Please call [name] at [phone].

## 2015-05-03 NOTE — Telephone Encounter (Signed)
Needs to be seen

## 2015-05-08 ENCOUNTER — Other Ambulatory Visit: Payer: Self-pay | Admitting: Family Medicine

## 2015-05-28 ENCOUNTER — Other Ambulatory Visit: Payer: Self-pay | Admitting: *Deleted

## 2015-05-28 MED ORDER — TRAMADOL HCL 50 MG PO TABS: 100.0000 mg | ORAL_TABLET | Freq: Four times a day (QID) | ORAL | Status: DC | PRN

## 2015-05-28 NOTE — Telephone Encounter (Signed)
Last office visit 04/11/2015 with Allie Bossier.  Last refilled 08/28/2014 for #120 with no refills.  Ok to refill?  Please print Rx to fax into mail order pharmacy.

## 2015-05-29 NOTE — Telephone Encounter (Signed)
Rx placed in Dr. Bedsole's in box for signature. 

## 2015-06-03 ENCOUNTER — Ambulatory Visit (INDEPENDENT_AMBULATORY_CARE_PROVIDER_SITE_OTHER): Payer: Medicare Other | Admitting: *Deleted

## 2015-06-03 DIAGNOSIS — Z95810 Presence of automatic (implantable) cardiac defibrillator: Secondary | ICD-10-CM | POA: Diagnosis not present

## 2015-06-03 DIAGNOSIS — I429 Cardiomyopathy, unspecified: Secondary | ICD-10-CM

## 2015-06-03 DIAGNOSIS — I428 Other cardiomyopathies: Secondary | ICD-10-CM

## 2015-06-03 DIAGNOSIS — I5032 Chronic diastolic (congestive) heart failure: Secondary | ICD-10-CM

## 2015-06-03 DIAGNOSIS — I4891 Unspecified atrial fibrillation: Secondary | ICD-10-CM

## 2015-06-03 NOTE — Progress Notes (Signed)
Remote ICD transmission.   

## 2015-06-04 ENCOUNTER — Other Ambulatory Visit: Payer: Self-pay | Admitting: Family Medicine

## 2015-06-15 DIAGNOSIS — R5383 Other fatigue: Secondary | ICD-10-CM | POA: Diagnosis not present

## 2015-06-15 DIAGNOSIS — R0689 Other abnormalities of breathing: Secondary | ICD-10-CM | POA: Diagnosis not present

## 2015-06-15 DIAGNOSIS — J988 Other specified respiratory disorders: Secondary | ICD-10-CM | POA: Diagnosis not present

## 2015-06-15 DIAGNOSIS — R52 Pain, unspecified: Secondary | ICD-10-CM | POA: Diagnosis not present

## 2015-06-16 ENCOUNTER — Encounter: Payer: Self-pay | Admitting: Cardiology

## 2015-06-16 LAB — CUP PACEART REMOTE DEVICE CHECK
Battery Remaining Longevity: 43 mo
Battery Voltage: 2.95 V
Brady Statistic AS VS Percent: 3.25 %
HighPow Impedance: 36 Ohm
HighPow Impedance: 43 Ohm
Implantable Lead Implant Date: 20080513
Implantable Lead Implant Date: 20080513
Implantable Lead Location: 753858
Implantable Lead Location: 753859
Implantable Lead Model: 4193
Implantable Lead Model: 6947
Lead Channel Impedance Value: 399 Ohm
Lead Channel Impedance Value: 456 Ohm
Lead Channel Pacing Threshold Amplitude: 0.5 V
Lead Channel Pacing Threshold Pulse Width: 0.8 ms
Lead Channel Sensing Intrinsic Amplitude: 10.125 mV
Lead Channel Sensing Intrinsic Amplitude: 8.625 mV
Lead Channel Setting Pacing Amplitude: 2.5 V
Lead Channel Setting Pacing Pulse Width: 0.4 ms
Lead Channel Setting Pacing Pulse Width: 0.8 ms
MDC IDC LEAD IMPLANT DT: 20080513
MDC IDC LEAD LOCATION: 753860
MDC IDC MSMT LEADCHNL LV IMPEDANCE VALUE: 4047 Ohm
MDC IDC MSMT LEADCHNL LV IMPEDANCE VALUE: 4047 Ohm
MDC IDC MSMT LEADCHNL LV PACING THRESHOLD AMPLITUDE: 2 V
MDC IDC MSMT LEADCHNL RA SENSING INTR AMPL: 0.375 mV
MDC IDC MSMT LEADCHNL RA SENSING INTR AMPL: 0.375 mV
MDC IDC MSMT LEADCHNL RV IMPEDANCE VALUE: 342 Ohm
MDC IDC MSMT LEADCHNL RV IMPEDANCE VALUE: 456 Ohm
MDC IDC MSMT LEADCHNL RV PACING THRESHOLD PULSEWIDTH: 0.4 ms
MDC IDC SESS DTM: 20170220151320
MDC IDC SET LEADCHNL LV PACING AMPLITUDE: 2.5 V
MDC IDC SET LEADCHNL RV SENSING SENSITIVITY: 0.3 mV
MDC IDC STAT BRADY AP VP PERCENT: 0 %
MDC IDC STAT BRADY AP VS PERCENT: 0 %
MDC IDC STAT BRADY AS VP PERCENT: 96.75 %
MDC IDC STAT BRADY RA PERCENT PACED: 0 %
MDC IDC STAT BRADY RV PERCENT PACED: 97.58 %

## 2015-06-16 NOTE — Progress Notes (Signed)
Normal remote reviewed.  Next [facility] 09/02/15 

## 2015-06-22 ENCOUNTER — Other Ambulatory Visit: Payer: Self-pay | Admitting: Family Medicine

## 2015-07-26 ENCOUNTER — Other Ambulatory Visit: Payer: Self-pay | Admitting: Family Medicine

## 2015-07-26 ENCOUNTER — Other Ambulatory Visit (HOSPITAL_COMMUNITY): Payer: Self-pay | Admitting: Internal Medicine

## 2015-08-01 ENCOUNTER — Other Ambulatory Visit: Payer: Self-pay | Admitting: *Deleted

## 2015-08-01 ENCOUNTER — Other Ambulatory Visit (HOSPITAL_COMMUNITY): Payer: Self-pay | Admitting: *Deleted

## 2015-08-01 MED ORDER — TAMSULOSIN HCL 0.4 MG PO CAPS: 0.4000 mg | ORAL_CAPSULE | Freq: Every day | ORAL | Status: DC

## 2015-08-01 MED ORDER — EPLERENONE 25 MG PO TABS: 12.5000 mg | ORAL_TABLET | Freq: Every day | ORAL | Status: DC

## 2015-08-02 ENCOUNTER — Other Ambulatory Visit: Payer: Self-pay | Admitting: *Deleted

## 2015-08-02 MED ORDER — GABAPENTIN 300 MG PO CAPS: ORAL_CAPSULE | ORAL | Status: DC

## 2015-08-02 NOTE — Telephone Encounter (Signed)
Last office visit 04/11/2015 with Gentry Fitz for cough.  Last refilled 11/27/2014 for #360 with 1 refill.  Ok to refill?

## 2015-08-05 ENCOUNTER — Other Ambulatory Visit (HOSPITAL_COMMUNITY): Payer: Self-pay | Admitting: *Deleted

## 2015-08-05 MED ORDER — EPLERENONE 25 MG PO TABS: 12.5000 mg | ORAL_TABLET | Freq: Every day | ORAL | Status: DC

## 2015-08-06 ENCOUNTER — Telehealth: Payer: Self-pay

## 2015-08-06 NOTE — Telephone Encounter (Signed)
[name] wants to know if pt is taking med for cholesterol;advised atorvastatin is for cholesterol and is on med list;[name] voiced understanding. Pt wants to know if allopurinol is for heart burn; advised toni allopurinol is for gout; pt wants to now if pt should be taking pantoprazole; advised on hx med list and noted change in therapy;dexilant is on current med list; toni voiced understanding. Finally toni wanted to know if meloxicam was for heart burn; advised no it is for inflammation and pain. [name] voiced understanding and said nothing further needed.

## 2015-08-26 ENCOUNTER — Other Ambulatory Visit: Payer: Self-pay | Admitting: Family Medicine

## 2015-08-27 ENCOUNTER — Ambulatory Visit: Payer: Medicare Other | Admitting: Family Medicine

## 2015-09-02 ENCOUNTER — Telehealth: Payer: Self-pay | Admitting: Cardiology

## 2015-09-02 ENCOUNTER — Ambulatory Visit (INDEPENDENT_AMBULATORY_CARE_PROVIDER_SITE_OTHER): Payer: Medicare Other | Admitting: *Deleted

## 2015-09-02 DIAGNOSIS — Z95810 Presence of automatic (implantable) cardiac defibrillator: Secondary | ICD-10-CM | POA: Diagnosis not present

## 2015-09-02 DIAGNOSIS — I429 Cardiomyopathy, unspecified: Secondary | ICD-10-CM | POA: Diagnosis not present

## 2015-09-02 DIAGNOSIS — I428 Other cardiomyopathies: Secondary | ICD-10-CM

## 2015-09-02 DIAGNOSIS — I5032 Chronic diastolic (congestive) heart failure: Secondary | ICD-10-CM | POA: Diagnosis not present

## 2015-09-02 NOTE — Telephone Encounter (Signed)
Confirmed remote transmission w/ pt daughter.   

## 2015-09-02 NOTE — Progress Notes (Signed)
Remote ICD transmission.   

## 2015-09-10 DIAGNOSIS — D692 Other nonthrombocytopenic purpura: Secondary | ICD-10-CM | POA: Diagnosis not present

## 2015-09-10 DIAGNOSIS — D239 Other benign neoplasm of skin, unspecified: Secondary | ICD-10-CM | POA: Diagnosis not present

## 2015-09-10 DIAGNOSIS — L570 Actinic keratosis: Secondary | ICD-10-CM | POA: Diagnosis not present

## 2015-09-10 DIAGNOSIS — L821 Other seborrheic keratosis: Secondary | ICD-10-CM | POA: Diagnosis not present

## 2015-09-11 ENCOUNTER — Ambulatory Visit (HOSPITAL_COMMUNITY)
Admission: RE | Admit: 2015-09-11 | Discharge: 2015-09-11 | Disposition: A | Discharge status: Home / Self Care | Payer: Medicare Other | Source: Ambulatory Visit | Attending: Internal Medicine | Admitting: Internal Medicine

## 2015-09-11 ENCOUNTER — Encounter (HOSPITAL_COMMUNITY): Payer: Self-pay | Admitting: Internal Medicine

## 2015-09-11 VITALS — BP 108/60 | HR 75 | Wt 176.5 lb

## 2015-09-11 DIAGNOSIS — Z8249 Family history of ischemic heart disease and other diseases of the circulatory system: Secondary | ICD-10-CM | POA: Insufficient documentation

## 2015-09-11 DIAGNOSIS — Z8674 Personal history of sudden cardiac arrest: Secondary | ICD-10-CM | POA: Diagnosis not present

## 2015-09-11 DIAGNOSIS — K219 Gastro-esophageal reflux disease without esophagitis: Secondary | ICD-10-CM | POA: Diagnosis not present

## 2015-09-11 DIAGNOSIS — Z981 Arthrodesis status: Secondary | ICD-10-CM | POA: Diagnosis not present

## 2015-09-11 DIAGNOSIS — Z7982 Long term (current) use of aspirin: Secondary | ICD-10-CM | POA: Diagnosis not present

## 2015-09-11 DIAGNOSIS — I4891 Unspecified atrial fibrillation: Secondary | ICD-10-CM | POA: Insufficient documentation

## 2015-09-11 DIAGNOSIS — Z8546 Personal history of malignant neoplasm of prostate: Secondary | ICD-10-CM | POA: Insufficient documentation

## 2015-09-11 DIAGNOSIS — E785 Hyperlipidemia, unspecified: Secondary | ICD-10-CM | POA: Insufficient documentation

## 2015-09-11 DIAGNOSIS — Z79899 Other long term (current) drug therapy: Secondary | ICD-10-CM | POA: Diagnosis not present

## 2015-09-11 DIAGNOSIS — F17210 Nicotine dependence, cigarettes, uncomplicated: Secondary | ICD-10-CM | POA: Insufficient documentation

## 2015-09-11 DIAGNOSIS — E119 Type 2 diabetes mellitus without complications: Secondary | ICD-10-CM | POA: Diagnosis not present

## 2015-09-11 DIAGNOSIS — I2510 Atherosclerotic heart disease of native coronary artery without angina pectoris: Secondary | ICD-10-CM | POA: Insufficient documentation

## 2015-09-11 DIAGNOSIS — I5032 Chronic diastolic (congestive) heart failure: Secondary | ICD-10-CM | POA: Diagnosis not present

## 2015-09-11 DIAGNOSIS — I110 Hypertensive heart disease with heart failure: Secondary | ICD-10-CM | POA: Diagnosis not present

## 2015-09-11 DIAGNOSIS — I428 Other cardiomyopathies: Secondary | ICD-10-CM | POA: Diagnosis not present

## 2015-09-11 DIAGNOSIS — J449 Chronic obstructive pulmonary disease, unspecified: Secondary | ICD-10-CM | POA: Diagnosis not present

## 2015-09-11 DIAGNOSIS — Z95810 Presence of automatic (implantable) cardiac defibrillator: Secondary | ICD-10-CM | POA: Insufficient documentation

## 2015-09-11 DIAGNOSIS — F17200 Nicotine dependence, unspecified, uncomplicated: Secondary | ICD-10-CM | POA: Diagnosis not present

## 2015-09-11 DIAGNOSIS — I5022 Chronic systolic (congestive) heart failure: Secondary | ICD-10-CM | POA: Insufficient documentation

## 2015-09-11 NOTE — Addendum Note (Signed)
Encounter addended by: Scarlette Calico, RN on: 09/11/2015  4:12 PM<BR>     Documentation filed: Patient Instructions Section

## 2015-09-11 NOTE — Patient Instructions (Signed)
We will contact you in 1 year to schedule your next appointment.  

## 2015-09-11 NOTE — Progress Notes (Signed)
ADVANCED HF CLINIC NOTE  Patient ID: [name], male   DOB: 1931-01-24, 82 y.o.   MRN: [id number] Patient ID: [name], male   DOB: May 29, 1930, 82 y.o.   MRN: [id number] EP: Dr Caryl Comes  PCP: Dr Diona Browner  HPI: [name] is an 82  year-old male with a history of a chronic systolic heart failure, NICM,  recovered ejection fraction he is also has h/o chronic atrial fib status post AV node ablation and  BIV ICD implantation (not on coumadin due to h/o GIB). He also has a history of COPD, diabetes, hypertension and hyperlipidemia.   In November 2010, he had syncopal episode and ICD showed VF with appropriate therapy.   Underwent ICD change out in Jan 2011 for ERI without problem. Had echo Feb 2011 EF 55-65%.  In November 2014 he was admitted to [facility] and treated for gout.   Repeat echo 2/15 EF 55-60%  He returns for yearly follow up. Says that he feels pretty good. Feels that he is getting weaker over the years but thinks it is normal.  No CP. Denies PND/Orthopnea. Uses electric WC due to neuropathy. But still able to ride tractor and do yardwork. Weight at home stable. Smokes regularly - says it makes him feel better than any medication he takes. Wife struggling with advanced dementia.   Labs 02/28/13 K 3.8 Creatinine 0.97 Labs 3/16  K 4.5 Creatinine 0.98  SH: Smoke 1.5 PPD. Does not drink alcohol. Lives with wife   FH: Father and Mother had heart failure . Sister HTN Obesity   ROS: All systems negative except as listed in HPI, PMH and Problem List.  Past Medical History  Diagnosis Date  . COPD (chronic obstructive pulmonary disease) (HCC)     GOLD II; Spirometry 07/10/2008 >FEV1 1.46 56% predicted, ratio of 66%  . Anemia     iron deficiency anemia with previous severe GI bleed   . Hypertension   . Hyperlipidemia   . Left bundle branch block   . Prostatic hypertrophy 09-11-97    Benign  . Obesity   . CHF (congestive heart failure) (HCC)     secondary to nonischemic  cardiomyopathy; ECHO 10/07 EF 20-25%, mod to severe MR; ECHO 1/10 EF 55-60%, mild MR; ECHO 2/11 55-65%, grade 1 diast dysfxn, miod to mod LAE, mild TR;    S/P Medtronic BiV ICD with biventricular function now turned off due to diahragmatic simulation  . CAD (coronary artery disease)     Mild, nonobstructive (LHC 1/07: mLAD 20%, pCFX 20-30%, mRCA 30%, EF 25%)  . Atrial fibrillation ([place])     s/p AV node ablation; not on coumadin due to GIB  . Cardiac arrest - ventricular fibrillation 02/2008    Aborted, shocked by ICD  . Dyspnea     exertional  . Pneumonia     01/2011  . Diabetes mellitus     type 2 NIDDM x 5-6 yrs  . GERD (gastroesophageal reflux disease)   . Cancer [facility])     prostate  . Arthritis     back and knees  . 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 Inguinal hernia     left; asymptomatic  . Chronic neck pain   . Cervical radiculopathy     left  . Torticollis     Current Outpatient Prescriptions  Medication Sig Dispense Refill  . acetaminophen (TYLENOL) 500 MG tablet Take 500 mg by mouth every 6 (  six) hours as needed for mild pain.     Marland Kitchen allopurinol (ZYLOPRIM) 100 MG tablet TAKE 1 TABLET DAILY 90 tablet 3  . aspirin 81 MG tablet Take 81 mg by mouth 2 (two) times daily.    Marland Kitchen atorvastatin (LIPITOR) 20 MG tablet TAKE 1 TABLET DAILY AT     BEDTIME 90 tablet 1  . AVODART 0.5 MG capsule TAKE 1 CAPSULE DAILY 90 capsule 1  . carvedilol (COREG) 3.125 MG tablet TAKE 1 TABLET TWICE A DAY  WITH MEALS 180 tablet 3  . Cholecalciferol (TH VITAMIN D3) 2000 UNITS CAPS Take 2,000 Units by mouth daily.     . clotrimazole (LOTRIMIN) 1 % cream Apply 1 application topically 2 (two) times daily. 30 g 0  . dexlansoprazole (DEXILANT) 60 MG capsule Take 1 capsule (60 mg total) by mouth daily. 90 capsule 3  . donepezil (ARICEPT) 5 MG tablet Take 1 tablet (5 mg total) by mouth at bedtime. 90 tablet 3  . eplerenone (INSPRA) 25 MG tablet Take 0.5 tablets (12.5 mg total) by mouth  daily. 45 tablet 3  . furosemide (LASIX) 40 MG tablet TAKE 1 TABLET DAILY 90 tablet 0  . gabapentin (NEURONTIN) 300 MG capsule TAKE 1 CAPSULE 4 TIMES     DAILY 360 capsule 1  . guaiFENesin (MUCINEX) 600 MG 12 hr tablet Take 1,200 mg by mouth 2 (two) times daily as needed for cough.     Marland Kitchen lisinopril (PRINIVIL,ZESTRIL) 2.5 MG tablet Take 1 tablet (2.5 mg total) by mouth daily. 90 tablet 3  . meloxicam (MOBIC) 15 MG tablet Take 1 tablet (15 mg total) by mouth daily. 30 tablet 0  . potassium chloride SA (KLOR-CON M20) 20 MEQ tablet Take 1 tablet (20 mEq total) by mouth 2 (two) times daily. 180 tablet 3  . PROAIR HFA 108 (90 Base) MCG/ACT inhaler FOR DIRECTIONS ON HOW TO   TAKE THIS MEDICINE, READ   THE ENCLOSED MEDICATION    INFORMATION FORM 25.5 g 3  . SPIRIVA HANDIHALER 18 MCG inhalation capsule INHALE THE CONTENTS OF ONE CAPSULE DAILY IN THE       MORNING VIA HANDHIHALER    DEVICE 90 capsule 3  . tamsulosin (FLOMAX) 0.4 MG CAPS capsule Take 1 capsule (0.4 mg total) by mouth daily after supper. 90 capsule 1  . traMADol (ULTRAM) 50 MG tablet Take 2 tablets (100 mg total) by mouth every 6 (six) hours as needed for moderate pain. 120 tablet 0   No current facility-administered medications for this encounter.     PHYSICAL EXAM: Filed Vitals:   09/11/15 1457  BP: 108/60  Pulse: 75   General:  Elderly. Sitting in [place]. No resp difficulty.  HEENT: normal Neck: supple. JVP flat. Carotids 2+ bilaterally; no bruits. No lymphadenopathy or thryomegaly appreciated. Cor: PMI normal. Regular rate & rhythm. No rubs, gallops or murmurs. Lungs: clear with decreased breath sounds throughout Abdomen: soft, nontender, nondistended. No hepatosplenomegaly. No bruits or masses. Good bowel sounds. Extremities: no cyanosis, clubbing, rash, tr edema. Distal pulses non palpable Neuro: alert & orientedx3, cranial nerves grossly intact. Moves all 4 extremities w/o difficulty. Affect pleasant.   ASSESSMENT & PLAN: 1.  Cardiomyopathy - NICM EF recovered Volume status stable. Continue lasix 40 mg daily and INSPRA 25 mg daily . Continue current dose bb and ace.  2. A  Fib- S/p AVN ablation and Biv pacing. Not on coumadin due to GIB. Continue aspirin 81 daily  3. Tobacco Abuse- declines smoking cessation.  Follow up 1 year .    Glori Bickers MD 4:00 PM

## 2015-09-13 ENCOUNTER — Ambulatory Visit: Payer: Medicare Other | Admitting: Family Medicine

## 2015-09-20 LAB — CUP PACEART REMOTE DEVICE CHECK
Battery Remaining Longevity: 39 mo
Brady Statistic AP VS Percent: 0 %
Brady Statistic RA Percent Paced: 0 %
Brady Statistic RV Percent Paced: 96.76 %
Date Time Interrogation Session: 20170522160110
HighPow Impedance: 38 Ohm
HighPow Impedance: 47 Ohm
Implantable Lead Location: 753858
Implantable Lead Model: 5076
Implantable Lead Model: 6947
Lead Channel Impedance Value: 437 Ohm
Lead Channel Pacing Threshold Amplitude: 0.625 V
Lead Channel Sensing Intrinsic Amplitude: 10.125 mV
Lead Channel Setting Pacing Amplitude: 2.5 V
Lead Channel Setting Pacing Pulse Width: 0.8 ms
MDC IDC LEAD IMPLANT DT: 20080513
MDC IDC LEAD IMPLANT DT: 20080513
MDC IDC LEAD IMPLANT DT: 20080513
MDC IDC LEAD LOCATION: 753859
MDC IDC LEAD LOCATION: 753860
MDC IDC MSMT BATTERY VOLTAGE: 2.94 V
MDC IDC MSMT LEADCHNL LV IMPEDANCE VALUE: 4047 Ohm
MDC IDC MSMT LEADCHNL LV IMPEDANCE VALUE: 4047 Ohm
MDC IDC MSMT LEADCHNL LV IMPEDANCE VALUE: 456 Ohm
MDC IDC MSMT LEADCHNL LV PACING THRESHOLD AMPLITUDE: 1.875 V
MDC IDC MSMT LEADCHNL LV PACING THRESHOLD PULSEWIDTH: 0.8 ms
MDC IDC MSMT LEADCHNL RA SENSING INTR AMPL: 0.375 mV
MDC IDC MSMT LEADCHNL RA SENSING INTR AMPL: 0.375 mV
MDC IDC MSMT LEADCHNL RV IMPEDANCE VALUE: 380 Ohm
MDC IDC MSMT LEADCHNL RV IMPEDANCE VALUE: 494 Ohm
MDC IDC MSMT LEADCHNL RV PACING THRESHOLD PULSEWIDTH: 0.4 ms
MDC IDC MSMT LEADCHNL RV SENSING INTR AMPL: 8.625 mV
MDC IDC SET LEADCHNL LV PACING AMPLITUDE: 2.5 V
MDC IDC SET LEADCHNL RV PACING PULSEWIDTH: 0.4 ms
MDC IDC SET LEADCHNL RV SENSING SENSITIVITY: 0.3 mV
MDC IDC STAT BRADY AP VP PERCENT: 0 %
MDC IDC STAT BRADY AS VP PERCENT: 95.71 %
MDC IDC STAT BRADY AS VS PERCENT: 4.29 %

## 2015-09-26 ENCOUNTER — Encounter: Payer: Self-pay | Admitting: Cardiology

## 2015-10-24 ENCOUNTER — Other Ambulatory Visit: Payer: Self-pay | Admitting: Family Medicine

## 2015-11-13 ENCOUNTER — Ambulatory Visit: Payer: Medicare Other | Admitting: Family Medicine

## 2015-11-14 ENCOUNTER — Telehealth: Payer: Self-pay | Admitting: Family Medicine

## 2015-11-14 NOTE — Telephone Encounter (Signed)
[name] called stating she is going to call [employer] to have them come fix it.  Switch or battery  is bad They maybe faxing you paperwork

## 2015-12-02 ENCOUNTER — Ambulatory Visit (INDEPENDENT_AMBULATORY_CARE_PROVIDER_SITE_OTHER): Payer: Medicare Other | Admitting: *Deleted

## 2015-12-02 ENCOUNTER — Telehealth: Payer: Self-pay | Admitting: Cardiology

## 2015-12-02 DIAGNOSIS — I429 Cardiomyopathy, unspecified: Secondary | ICD-10-CM | POA: Diagnosis not present

## 2015-12-02 DIAGNOSIS — I428 Other cardiomyopathies: Secondary | ICD-10-CM

## 2015-12-02 DIAGNOSIS — Z95810 Presence of automatic (implantable) cardiac defibrillator: Secondary | ICD-10-CM

## 2015-12-02 NOTE — Progress Notes (Signed)
Remote ICD transmission.   

## 2015-12-02 NOTE — Telephone Encounter (Signed)
Confirmed remote transmission w/ pt daughter.   

## 2015-12-04 ENCOUNTER — Encounter: Payer: Self-pay | Admitting: Cardiology

## 2015-12-09 ENCOUNTER — Other Ambulatory Visit: Payer: Self-pay

## 2015-12-10 LAB — CUP PACEART REMOTE DEVICE CHECK
Brady Statistic AP VP Percent: 0 %
Brady Statistic AS VP Percent: 97.73 %
Brady Statistic RA Percent Paced: 0 %
Brady Statistic RV Percent Paced: 98.27 %
Date Time Interrogation Session: 20170821163413
HighPow Impedance: 36 Ohm
HighPow Impedance: 44 Ohm
Implantable Lead Implant Date: 20080513
Implantable Lead Implant Date: 20080513
Implantable Lead Location: 753858
Implantable Lead Location: 753860
Implantable Lead Model: 5076
Lead Channel Impedance Value: 4047 Ohm
Lead Channel Impedance Value: 437 Ohm
Lead Channel Impedance Value: 456 Ohm
Lead Channel Sensing Intrinsic Amplitude: 0.5 mV
Lead Channel Sensing Intrinsic Amplitude: 8.625 mV
Lead Channel Setting Pacing Pulse Width: 0.4 ms
MDC IDC LEAD IMPLANT DT: 20080513
MDC IDC LEAD LOCATION: 753859
MDC IDC MSMT BATTERY REMAINING LONGEVITY: 31 mo
MDC IDC MSMT BATTERY VOLTAGE: 2.95 V
MDC IDC MSMT LEADCHNL LV IMPEDANCE VALUE: 399 Ohm
MDC IDC MSMT LEADCHNL LV IMPEDANCE VALUE: 4047 Ohm
MDC IDC MSMT LEADCHNL LV PACING THRESHOLD AMPLITUDE: 2.5 V
MDC IDC MSMT LEADCHNL LV PACING THRESHOLD PULSEWIDTH: 0.8 ms
MDC IDC MSMT LEADCHNL RA SENSING INTR AMPL: 0.5 mV
MDC IDC MSMT LEADCHNL RV IMPEDANCE VALUE: 380 Ohm
MDC IDC MSMT LEADCHNL RV PACING THRESHOLD AMPLITUDE: 0.625 V
MDC IDC MSMT LEADCHNL RV PACING THRESHOLD PULSEWIDTH: 0.4 ms
MDC IDC MSMT LEADCHNL RV SENSING INTR AMPL: 10.125 mV
MDC IDC SET LEADCHNL LV PACING AMPLITUDE: 2.5 V
MDC IDC SET LEADCHNL LV PACING PULSEWIDTH: 0.8 ms
MDC IDC SET LEADCHNL RV PACING AMPLITUDE: 2.5 V
MDC IDC SET LEADCHNL RV SENSING SENSITIVITY: 0.3 mV
MDC IDC STAT BRADY AP VS PERCENT: 0 %
MDC IDC STAT BRADY AS VS PERCENT: 2.27 %

## 2016-01-11 ENCOUNTER — Other Ambulatory Visit: Payer: Self-pay | Admitting: Family Medicine

## 2016-01-23 ENCOUNTER — Telehealth: Payer: Self-pay | Admitting: Family Medicine

## 2016-01-23 DIAGNOSIS — D508 Other iron deficiency anemias: Secondary | ICD-10-CM

## 2016-01-23 DIAGNOSIS — N179 Acute kidney failure, unspecified: Secondary | ICD-10-CM

## 2016-01-23 NOTE — Telephone Encounter (Signed)
Pt daughter came in to make an appt for [name] for headaches- he is sch for 10/13 @ 3:15pm. She wants you to know he is having more memory issues and thinks an evaluation would be good at appt. She does not want Jovonne to know she requested this.

## 2016-01-24 ENCOUNTER — Encounter: Payer: Self-pay | Admitting: Family Medicine

## 2016-01-24 ENCOUNTER — Ambulatory Visit (INDEPENDENT_AMBULATORY_CARE_PROVIDER_SITE_OTHER): Payer: Medicare Other | Admitting: Family Medicine

## 2016-01-24 VITALS — BP 110/80 | HR 74 | Temp 97.3°F | Ht 67.0 in | Wt 174.8 lb

## 2016-01-24 DIAGNOSIS — F0390 Unspecified dementia without behavioral disturbance: Secondary | ICD-10-CM | POA: Diagnosis not present

## 2016-01-24 DIAGNOSIS — F03B0 Unspecified dementia, moderate, without behavioral disturbance, psychotic disturbance, mood disturbance, and anxiety: Secondary | ICD-10-CM

## 2016-01-24 DIAGNOSIS — Z23 Encounter for immunization: Secondary | ICD-10-CM

## 2016-01-24 DIAGNOSIS — R42 Dizziness and giddiness: Secondary | ICD-10-CM

## 2016-01-24 DIAGNOSIS — I10 Essential (primary) hypertension: Secondary | ICD-10-CM | POA: Diagnosis not present

## 2016-01-24 DIAGNOSIS — E119 Type 2 diabetes mellitus without complications: Secondary | ICD-10-CM | POA: Diagnosis not present

## 2016-01-24 DIAGNOSIS — D509 Iron deficiency anemia, unspecified: Secondary | ICD-10-CM

## 2016-01-24 LAB — CBC WITH DIFFERENTIAL/PLATELET
BASOS ABS: 0 {cells}/uL (ref 0–200)
Basophils Relative: 0 %
EOS ABS: 276 {cells}/uL (ref 15–500)
Eosinophils Relative: 3 %
HEMATOCRIT: 20.5 % — AB (ref 38.5–50.0)
Hemoglobin: 6.5 g/dL — ABNORMAL LOW (ref 13.2–17.1)
LYMPHS PCT: 29 %
Lymphs Abs: 2668 cells/uL (ref 850–3900)
MCH: 23.4 pg — ABNORMAL LOW (ref 27.0–33.0)
MCHC: 31.2 g/dL — AB (ref 32.0–36.0)
MCV: 73.7 fL — AB (ref 80.0–100.0)
MONO ABS: 1012 {cells}/uL — AB (ref 200–950)
MONOS PCT: 11 %
MPV: 8.4 fL (ref 7.5–12.5)
NEUTROS ABS: 5244 {cells}/uL (ref 1500–7800)
Neutrophils Relative %: 57 %
Platelets: 264 10*3/uL (ref 140–400)
RBC: 2.78 MIL/uL — ABNORMAL LOW (ref 4.20–5.80)
RDW: 17.5 % — AB (ref 11.0–15.0)
WBC: 9.2 10*3/uL (ref 3.8–10.8)

## 2016-01-24 LAB — COMPREHENSIVE METABOLIC PANEL
ALBUMIN: 4 g/dL (ref 3.6–5.1)
ALT: 9 U/L (ref 9–46)
AST: 13 U/L (ref 10–35)
Alkaline Phosphatase: 108 U/L (ref 40–115)
BILIRUBIN TOTAL: 0.4 mg/dL (ref 0.2–1.2)
BUN: 31 mg/dL — ABNORMAL HIGH (ref 7–25)
CO2: 22 mmol/L (ref 20–31)
CREATININE: 1.49 mg/dL — AB (ref 0.70–1.11)
Calcium: 8.9 mg/dL (ref 8.6–10.3)
Chloride: 106 mmol/L (ref 98–110)
GLUCOSE: 91 mg/dL (ref 65–99)
Potassium: 4.8 mmol/L (ref 3.5–5.3)
SODIUM: 139 mmol/L (ref 135–146)
Total Protein: 6.4 g/dL (ref 6.1–8.1)

## 2016-01-24 LAB — VITAMIN B12: Vitamin B-12: 291 pg/mL (ref 200–1100)

## 2016-01-24 LAB — HEMOGLOBIN A1C
Hgb A1c MFr Bld: 6.4 % — ABNORMAL HIGH (ref <5.7)
Mean Plasma Glucose: 137 mg/dL

## 2016-01-24 LAB — TSH: TSH: 1.8 mIU/L (ref 0.40–4.50)

## 2016-01-24 MED ORDER — DONEPEZIL HCL 10 MG PO TABS: 10.0000 mg | ORAL_TABLET | Freq: Every day | ORAL | 3 refills | Status: DC

## 2016-01-24 NOTE — Progress Notes (Signed)
   Subjective:    Patient ID: [name], male    DOB: Aug 02, 1930, 82 y.o.   MRN: [id number]  HPI   82 year old male with history of Chronic neck a[pin, afib, chronic diastolic heart failure, DM,   New onset headache in last 2 months. Occuring daily until last week. He has been eating a lot of sugar lately... No checking sugars at home.  BP Readings from Last 3 Encounters:  01/24/16 (!) 125/52  09/11/15 108/60  04/11/15 138/84   Lab Results  Component Value Date   HGBA1C 6.8 (H) 02/22/2015     Wife is sicker than before , in hospice. Under  aLot of stress, overwhelmed with wife"s care. Daughter helps. He is anxious, no depression.   Feels swimmy headed with leaning over and standing up.  Daughter requested dementia eval as it is worsening in her opinion.    Review of Systems  Constitutional: Positive for fatigue.  HENT: Negative for ear pain.   Eyes: Negative for pain.  Respiratory: Negative for shortness of breath.   Cardiovascular: Negative for chest pain.  Gastrointestinal: Negative for abdominal pain.   Has cramps in legs and ankle    Objective:   Physical Exam  Constitutional: Vital signs are normal. He appears well-developed and well-nourished.  HENT:  Head: Normocephalic.  Right Ear: Hearing normal.  Left Ear: Hearing normal.  Nose: Nose normal.  Mouth/Throat: Oropharynx is clear and moist and mucous membranes are normal.  Neck: Trachea normal. Carotid bruit is not present. No thyroid mass and no thyromegaly present.  Cardiovascular: Normal rate, regular rhythm and normal pulses.  Exam reveals distant heart sounds. Exam reveals no gallop and no friction rub.   No murmur heard.  Left lower leg peripheral edema, varicosities  Pulmonary/Chest: Effort normal and breath sounds normal. No respiratory distress.  Musculoskeletal:  Arthritis deformity in bilateral hands  Neurological: He is alert. He is not disoriented. He displays atrophy. No cranial  nerve deficit or sensory deficit. He exhibits abnormal muscle tone. Coordination and gait abnormal.    Strength bilateral upper ext: 4/5 in shoulder abd, add 4/5 wrist flex ext, 3/5 on right grip, 4/5 on left Strength bilateral lower ext: 5/5 thorughout   Skin: Skin is warm, dry and intact. No rash noted.  Psychiatric: He has a normal mood and affect. His speech is normal and behavior is normal. Thought content normal.          Assessment & Plan:

## 2016-01-24 NOTE — Patient Instructions (Addendum)
Stop at lab on way out.  Increase Aricept to 10 mg daily

## 2016-01-24 NOTE — Assessment & Plan Note (Addendum)
Progression of symtpoms per MMSE as well as per daughter's report. Increase aricept to 10 mg.

## 2016-01-24 NOTE — Progress Notes (Signed)
Pre visit review using our clinic review tool, if applicable. No additional management support is needed unless otherwise documented below in the visit note. 

## 2016-01-24 NOTE — Assessment & Plan Note (Signed)
Re-eval given dizziness

## 2016-01-24 NOTE — Telephone Encounter (Signed)
OKAY...

## 2016-01-24 NOTE — Assessment & Plan Note (Addendum)
Eval with labs.  Orthostatic show: no hypotension

## 2016-01-25 ENCOUNTER — Encounter (HOSPITAL_COMMUNITY): Payer: Self-pay | Admitting: Emergency Medicine

## 2016-01-25 ENCOUNTER — Telehealth: Payer: Self-pay | Admitting: Family Medicine

## 2016-01-25 ENCOUNTER — Emergency Department (HOSPITAL_COMMUNITY): Payer: Medicare Other

## 2016-01-25 ENCOUNTER — Observation Stay (HOSPITAL_COMMUNITY)
Admission: EM | Admit: 2016-01-25 | Discharge: 2016-01-26 | Disposition: A | Discharge status: Home / Self Care | Payer: Medicare Other | Attending: Internal Medicine | Admitting: Internal Medicine

## 2016-01-25 DIAGNOSIS — F17210 Nicotine dependence, cigarettes, uncomplicated: Secondary | ICD-10-CM | POA: Diagnosis not present

## 2016-01-25 DIAGNOSIS — E669 Obesity, unspecified: Secondary | ICD-10-CM | POA: Diagnosis not present

## 2016-01-25 DIAGNOSIS — D500 Iron deficiency anemia secondary to blood loss (chronic): Secondary | ICD-10-CM | POA: Diagnosis not present

## 2016-01-25 DIAGNOSIS — R1310 Dysphagia, unspecified: Secondary | ICD-10-CM | POA: Diagnosis not present

## 2016-01-25 DIAGNOSIS — I110 Hypertensive heart disease with heart failure: Secondary | ICD-10-CM | POA: Insufficient documentation

## 2016-01-25 DIAGNOSIS — R51 Headache: Secondary | ICD-10-CM | POA: Diagnosis not present

## 2016-01-25 DIAGNOSIS — J449 Chronic obstructive pulmonary disease, unspecified: Secondary | ICD-10-CM | POA: Insufficient documentation

## 2016-01-25 DIAGNOSIS — K219 Gastro-esophageal reflux disease without esophagitis: Secondary | ICD-10-CM | POA: Insufficient documentation

## 2016-01-25 DIAGNOSIS — M109 Gout, unspecified: Secondary | ICD-10-CM | POA: Insufficient documentation

## 2016-01-25 DIAGNOSIS — D649 Anemia, unspecified: Secondary | ICD-10-CM | POA: Diagnosis not present

## 2016-01-25 DIAGNOSIS — Z95810 Presence of automatic (implantable) cardiac defibrillator: Secondary | ICD-10-CM | POA: Insufficient documentation

## 2016-01-25 DIAGNOSIS — Z79899 Other long term (current) drug therapy: Secondary | ICD-10-CM | POA: Insufficient documentation

## 2016-01-25 DIAGNOSIS — R42 Dizziness and giddiness: Secondary | ICD-10-CM | POA: Diagnosis present

## 2016-01-25 DIAGNOSIS — Z7982 Long term (current) use of aspirin: Secondary | ICD-10-CM | POA: Diagnosis not present

## 2016-01-25 DIAGNOSIS — E1140 Type 2 diabetes mellitus with diabetic neuropathy, unspecified: Secondary | ICD-10-CM | POA: Diagnosis not present

## 2016-01-25 DIAGNOSIS — I5032 Chronic diastolic (congestive) heart failure: Secondary | ICD-10-CM | POA: Diagnosis not present

## 2016-01-25 DIAGNOSIS — Z8674 Personal history of sudden cardiac arrest: Secondary | ICD-10-CM | POA: Diagnosis not present

## 2016-01-25 DIAGNOSIS — Z8546 Personal history of malignant neoplasm of prostate: Secondary | ICD-10-CM | POA: Insufficient documentation

## 2016-01-25 DIAGNOSIS — N400 Enlarged prostate without lower urinary tract symptoms: Secondary | ICD-10-CM | POA: Diagnosis not present

## 2016-01-25 DIAGNOSIS — F0390 Unspecified dementia without behavioral disturbance: Secondary | ICD-10-CM | POA: Insufficient documentation

## 2016-01-25 DIAGNOSIS — N179 Acute kidney failure, unspecified: Secondary | ICD-10-CM | POA: Diagnosis present

## 2016-01-25 DIAGNOSIS — I4891 Unspecified atrial fibrillation: Secondary | ICD-10-CM | POA: Insufficient documentation

## 2016-01-25 DIAGNOSIS — I428 Other cardiomyopathies: Secondary | ICD-10-CM | POA: Insufficient documentation

## 2016-01-25 DIAGNOSIS — E785 Hyperlipidemia, unspecified: Secondary | ICD-10-CM | POA: Diagnosis not present

## 2016-01-25 DIAGNOSIS — Z6827 Body mass index (BMI) 27.0-27.9, adult: Secondary | ICD-10-CM | POA: Diagnosis not present

## 2016-01-25 DIAGNOSIS — I2510 Atherosclerotic heart disease of native coronary artery without angina pectoris: Secondary | ICD-10-CM | POA: Diagnosis not present

## 2016-01-25 DIAGNOSIS — Z66 Do not resuscitate: Secondary | ICD-10-CM | POA: Insufficient documentation

## 2016-01-25 DIAGNOSIS — D509 Iron deficiency anemia, unspecified: Secondary | ICD-10-CM | POA: Diagnosis present

## 2016-01-25 DIAGNOSIS — R0602 Shortness of breath: Secondary | ICD-10-CM | POA: Diagnosis not present

## 2016-01-25 DIAGNOSIS — I10 Essential (primary) hypertension: Secondary | ICD-10-CM | POA: Diagnosis present

## 2016-01-25 HISTORY — DX: Unspecified dementia, unspecified severity, without behavioral disturbance, psychotic disturbance, mood disturbance, and anxiety: F03.90

## 2016-01-25 HISTORY — DX: Other specified health status: Z78.9

## 2016-01-25 HISTORY — DX: Presence of cardiac pacemaker: Z95.0

## 2016-01-25 HISTORY — DX: Chronic kidney disease, unspecified: N18.9

## 2016-01-25 HISTORY — DX: Presence of automatic (implantable) cardiac defibrillator: Z95.810

## 2016-01-25 LAB — COMPREHENSIVE METABOLIC PANEL
ALBUMIN: 3.7 g/dL (ref 3.5–5.0)
ALK PHOS: 112 U/L (ref 38–126)
ALT: 11 U/L — ABNORMAL LOW (ref 17–63)
AST: 16 U/L (ref 15–41)
Anion gap: 9 (ref 5–15)
BILIRUBIN TOTAL: 0.5 mg/dL (ref 0.3–1.2)
BUN: 25 mg/dL — AB (ref 6–20)
CO2: 22 mmol/L (ref 22–32)
Calcium: 9.1 mg/dL (ref 8.9–10.3)
Chloride: 105 mmol/L (ref 101–111)
Creatinine, Ser: 1.46 mg/dL — ABNORMAL HIGH (ref 0.61–1.24)
GFR calc Af Amer: 49 mL/min — ABNORMAL LOW (ref >60)
GFR calc non Af Amer: 43 mL/min — ABNORMAL LOW (ref >60)
GLUCOSE: 165 mg/dL — AB (ref 65–99)
POTASSIUM: 3.9 mmol/L (ref 3.5–5.1)
SODIUM: 136 mmol/L (ref 135–145)
TOTAL PROTEIN: 6.4 g/dL — AB (ref 6.5–8.1)

## 2016-01-25 LAB — CBC
HEMATOCRIT: 20.6 % — AB (ref 39.0–52.0)
Hemoglobin: 6.2 g/dL — CL (ref 13.0–17.0)
MCH: 22.7 pg — AB (ref 26.0–34.0)
MCHC: 30.1 g/dL (ref 30.0–36.0)
MCV: 75.5 fL — ABNORMAL LOW (ref 78.0–100.0)
Platelets: 263 10*3/uL (ref 150–400)
RBC: 2.73 MIL/uL — ABNORMAL LOW (ref 4.22–5.81)
RDW: 17.6 % — AB (ref 11.5–15.5)
WBC: 10 10*3/uL (ref 4.0–10.5)

## 2016-01-25 LAB — CREATININE, URINE, RANDOM: CREATININE, URINE: 44.06 mg/dL

## 2016-01-25 LAB — PREPARE RBC (CROSSMATCH)

## 2016-01-25 LAB — RETICULOCYTES
RBC.: 2.75 MIL/uL — AB (ref 4.22–5.81)
RETIC COUNT ABSOLUTE: 44 10*3/uL (ref 19.0–186.0)
RETIC CT PCT: 1.6 % (ref 0.4–3.1)

## 2016-01-25 LAB — IRON AND TIBC
Iron: 25 ug/dL — ABNORMAL LOW (ref 45–182)
Saturation Ratios: 5 % — ABNORMAL LOW (ref 17.9–39.5)
TIBC: 458 ug/dL — ABNORMAL HIGH (ref 250–450)
UIBC: 433 ug/dL

## 2016-01-25 LAB — SAVE SMEAR

## 2016-01-25 LAB — GLUCOSE, CAPILLARY
Glucose-Capillary: 108 mg/dL — ABNORMAL HIGH (ref 65–99)
Glucose-Capillary: 71 mg/dL (ref 65–99)

## 2016-01-25 LAB — FERRITIN: Ferritin: 10 ng/mL — ABNORMAL LOW (ref 24–336)

## 2016-01-25 MED ORDER — POTASSIUM CHLORIDE CRYS ER 20 MEQ PO TBCR
20.0000 meq | EXTENDED_RELEASE_TABLET | Freq: Two times a day (BID) | ORAL | Status: DC
  Administered 2016-01-25 – 2016-01-26 (×2): 20 meq via ORAL
  Filled 2016-01-25 (×2): qty 1

## 2016-01-25 MED ORDER — GABAPENTIN 300 MG PO CAPS
300.0000 mg | ORAL_CAPSULE | Freq: Four times a day (QID) | ORAL | Status: DC
Start: 2016-01-25 — End: 2016-01-26
  Administered 2016-01-25 – 2016-01-26 (×3): 300 mg via ORAL
  Filled 2016-01-25 (×3): qty 1

## 2016-01-25 MED ORDER — DUTASTERIDE 0.5 MG PO CAPS
0.5000 mg | ORAL_CAPSULE | Freq: Every day | ORAL | Status: DC
  Administered 2016-01-26: 0.5 mg via ORAL
  Filled 2016-01-25: qty 1

## 2016-01-25 MED ORDER — ACETAMINOPHEN 325 MG PO TABS
650.0000 mg | ORAL_TABLET | Freq: Four times a day (QID) | ORAL | Status: DC | PRN
  Administered 2016-01-25: 650 mg via ORAL
  Filled 2016-01-25: qty 2

## 2016-01-25 MED ORDER — SODIUM CHLORIDE 0.9% FLUSH
3.0000 mL | Freq: Two times a day (BID) | INTRAVENOUS | Status: DC
  Administered 2016-01-25 – 2016-01-26 (×2): 3 mL via INTRAVENOUS

## 2016-01-25 MED ORDER — SPIRONOLACTONE 25 MG PO TABS
25.0000 mg | ORAL_TABLET | Freq: Every day | ORAL | Status: DC
  Administered 2016-01-26: 25 mg via ORAL
  Filled 2016-01-25: qty 1

## 2016-01-25 MED ORDER — DONEPEZIL HCL 5 MG PO TABS
10.0000 mg | ORAL_TABLET | Freq: Every day | ORAL | Status: DC
  Administered 2016-01-25: 10 mg via ORAL
  Filled 2016-01-25: qty 2

## 2016-01-25 MED ORDER — CARVEDILOL 3.125 MG PO TABS
3.1250 mg | ORAL_TABLET | Freq: Two times a day (BID) | ORAL | Status: DC
  Administered 2016-01-25 – 2016-01-26 (×2): 3.125 mg via ORAL
  Filled 2016-01-25 (×2): qty 1

## 2016-01-25 MED ORDER — HYDRALAZINE HCL 20 MG/ML IJ SOLN: 10.0000 mg | Freq: Three times a day (TID) | INTRAMUSCULAR | Status: DC | PRN

## 2016-01-25 MED ORDER — SODIUM CHLORIDE 0.9 % IV SOLN: Freq: Once | INTRAVENOUS | Status: DC

## 2016-01-25 MED ORDER — ALLOPURINOL 100 MG PO TABS
100.0000 mg | ORAL_TABLET | Freq: Every day | ORAL | Status: DC
  Administered 2016-01-26: 100 mg via ORAL
  Filled 2016-01-25: qty 1

## 2016-01-25 MED ORDER — PANTOPRAZOLE SODIUM 40 MG PO TBEC
80.0000 mg | DELAYED_RELEASE_TABLET | Freq: Every day | ORAL | Status: DC
  Administered 2016-01-26: 80 mg via ORAL
  Filled 2016-01-25: qty 2

## 2016-01-25 MED ORDER — ATORVASTATIN CALCIUM 10 MG PO TABS
20.0000 mg | ORAL_TABLET | Freq: Every day | ORAL | Status: DC
  Administered 2016-01-25: 20 mg via ORAL
  Filled 2016-01-25: qty 2

## 2016-01-25 MED ORDER — INSULIN ASPART 100 UNIT/ML ~~LOC~~ SOLN: 0.0000 [IU] | Freq: Three times a day (TID) | SUBCUTANEOUS | Status: DC

## 2016-01-25 MED ORDER — TIOTROPIUM BROMIDE MONOHYDRATE 18 MCG IN CAPS
18.0000 ug | ORAL_CAPSULE | Freq: Every day | RESPIRATORY_TRACT | Status: DC
  Administered 2016-01-26: 18 ug via RESPIRATORY_TRACT
  Filled 2016-01-25: qty 5

## 2016-01-25 MED ORDER — TAMSULOSIN HCL 0.4 MG PO CAPS
0.4000 mg | ORAL_CAPSULE | Freq: Every day | ORAL | Status: DC
  Administered 2016-01-25: 0.4 mg via ORAL
  Filled 2016-01-25: qty 1

## 2016-01-25 MED ORDER — ALBUTEROL SULFATE HFA 108 (90 BASE) MCG/ACT IN AERS: 2.0000 | INHALATION_SPRAY | Freq: Four times a day (QID) | RESPIRATORY_TRACT | Status: DC | PRN

## 2016-01-25 MED ORDER — FUROSEMIDE 10 MG/ML IJ SOLN: 20.0000 mg | Freq: Once | INTRAMUSCULAR | Status: DC | PRN

## 2016-01-25 MED ORDER — ALBUTEROL SULFATE (2.5 MG/3ML) 0.083% IN NEBU: 2.5000 mg | INHALATION_SOLUTION | Freq: Four times a day (QID) | RESPIRATORY_TRACT | Status: DC | PRN

## 2016-01-25 MED ORDER — ACETAMINOPHEN 650 MG RE SUPP: 650.0000 mg | Freq: Four times a day (QID) | RECTAL | Status: DC | PRN

## 2016-01-25 NOTE — Telephone Encounter (Signed)
Received a call from [facility], Critical lab of Hemoglobin of 6.5.  Chart reviewed.  Called patient's daughter (she is also his HPOA).  She will take him immediately to the ER for work up/blood transfusion.  Will route to PCP for FYI.

## 2016-01-25 NOTE — ED Provider Notes (Signed)
[facility] DEPT Provider Note   CSN: [id number] Arrival date & time: 01/25/16  1327     History   Chief Complaint Chief Complaint  Patient presents with  . Abnormal Lab  . Anemia    HPI [name] is a 82 y.o. male.  The history is provided by the patient. No language interpreter was used.  Abnormal Lab  Anemia    [name] is a 82 y.o. male who presents to the Emergency Department complaining of abnormal lab.  Yesterday he saw his PCP for evaluation of progressive weakness, fatigue, dizziness. He also endorses several months of headaches. His weakness and dizziness is worse when he bends over or stands up. He denies any fevers, chest pain, shortness of breath, abdominal pain, nausea, black or bloody stools. He has a history of anemia in the past and required blood transfusions. Past Medical History:  Diagnosis Date  . Anemia    iron deficiency anemia with previous severe GI bleed   . Arthritis    back and knees  . Atrial fibrillation ([place])    s/p AV node ablation; not on coumadin due to GIB  . Blood clot in spinal cord artery ([place]) 2011   s/p ACDF  . CAD (coronary artery disease)    Mild, nonobstructive (LHC 1/07: mLAD 20%, pCFX 20-30%, mRCA 30%, EF 25%)  . Cancer [facility])    prostate  . Cardiac arrest - ventricular fibrillation 02/2008   Aborted, shocked by ICD  . Cervical radiculopathy    left  . CHF (congestive heart failure) (HCC)    secondary to nonischemic cardiomyopathy; ECHO 10/07 EF 20-25%, mod to severe MR; ECHO 1/10 EF 55-60%, mild MR; ECHO 2/11 55-65%, grade 1 diast dysfxn, miod to mod LAE, mild TR;    S/P Medtronic BiV ICD with biventricular function now turned off due to diahragmatic simulation  . Chronic neck pain   . COPD (chronic obstructive pulmonary disease) (HCC)    GOLD II; Spirometry 07/10/2008 >FEV1 1.46 56% predicted, ratio of 66%  . Diabetes mellitus    type 2 NIDDM x 5-6 yrs  . Dyspnea    exertional  . GERD  (gastroesophageal reflux disease)   . Hyperlipidemia   . Hypertension   . Inguinal hernia    left; asymptomatic  . Left bundle branch block   . Obesity   . Pneumonia    01/2011  . Prostatic hypertrophy 09-11-97   Benign  . Torticollis     Patient Active Problem List   Diagnosis Date Noted  . Blood loss anemia 01/25/2016  . AKI (acute kidney injury) ([place]) 01/25/2016  . Decreased mobility 03/14/2015  . Counseling regarding end of life decision making 02/28/2015  . Left knee pain 12/11/2014  . Liver lesion 09/27/2014  . Acute encephalopathy 08/30/2014  . Transaminitis 08/30/2014  . Type 2 diabetes mellitus with diabetic neuropathy ([place])   . Chronic neck pain 08/28/2014  . Allergic rhinitis 06/22/2014  . Peripheral edema 04/17/2014  . Spinal stenosis of lumbar region 01/19/2014  . Moderate dementia without behavioral disturbance 12/22/2013  . Essential hypertension, benign 02/28/2013  . Acute gouty arthritis 02/28/2013  . Herpes zoster 01/20/2013  . Dizziness and giddiness 08/10/2012  . B12 deficiency 06/03/2012  . Polymorphic ventricular tachycardia ([place]) 05/25/2012  . Biventricular implantable cardioverter-defibrillator Medtronic 05/18/2011  . Chronic diastolic heart failure ([place]) 11/19/2010  . Atrial fibrillation ([place]) 11/19/2010  . Leg pain, bilateral 11/05/2010  . PERIPHERAL NEUROPATHY 02/14/2010  . Vitamin D deficiency  01/22/2010  . TOBACCO ABUSE 11/07/2008  . ADENOCARCINOMA, PROSTATE 10/10/2008  . Ventricular fibrillation ([place]) 07/03/2008  . CAROTIDYNIA 10/21/2006  . Hyperlipidemia 06/30/2006  . Anemia, iron deficiency 06/30/2006  . COPD (chronic obstructive pulmonary disease) ([place]) 06/30/2006  . BENIGN PROSTATIC HYPERTROPHY 06/30/2006  . PNEUMONIA, HX OF 06/30/2006  . ABSCESS, PERIRECTAL, HX OF 06/30/2006  . Pulmonary nodules 09/11/1997    Past Surgical History:  Procedure Laterality Date  . ANTERIOR CERVICAL DISCECTOMY  02/2010  . BIV ICD GENERTAOR CHANGE  OUT N/A 02/08/2013   Procedure: BIV ICD GENERTAOR CHANGE OUT;  Surgeon: Deboraha Sprang, MD;  Location: [facility] LAB;  Service: Cardiovascular;  Laterality: N/A;  . CARDIAC CATHETERIZATION  01/2006   Showed mild nonobstructive CAD  . CARDIAC CATHETERIZATION  11/05/2006   Right atrial pressure mean of 12, RV pressure 36/8, PA pressure 39/16 witha mean of 28, wedge pressure was 20. Fick cardiac output was 5 liters per minute, cardiac index was 2.4  . COLONOSCOPY  06/26/2002   Multip (neg) divertics, int hemm  . COLONOSCOPY  07/10/2004   Poyps, divertics, int hemms  . COLONOSCOPY  06/19/2008   2 polyps divertics int hemms (Dr Henrene Pastor)  . COLONOSCOPY W/ POLYPECTOMY  11/1997   Divertics, int hemms  . CORONARY ANGIOPLASTY     Min abstrut zd severe LB dystn EF 25%  . CT HEAD LIMITED W/O CM  10/03/2006   No acute abnmlty  . ESOPHAGOGASTRODUODENOSCOPY  09/1997   Prepylor ulcer, esoph ring, duod avm  . ESOPHAGOGASTRODUODENOSCOPY  05/22/2002   Poss Barrett's   . ESOPHAGOGASTRODUODENOSCOPY  02/19/2006   HH No active bleeding  . EYE SURGERY     cataract, bilateral  . HOSP  8/14-8/23/2008   Acute on chronis CHF IIIB N[place] Cardiomyop EF 20-25% Mod-Sev MR  . INSERT / REPLACE / REMOVE PACEMAKER  2007  . KNEE ARTHROSCOPY  1988  . KNEE SURGERY  1996   Left  . LOBECTOMY  01-21-98   lung  . LUMBAR LAMINECTOMY/DECOMPRESSION MICRODISCECTOMY  04/29/2011   Procedure: LUMBAR LAMINECTOMY/DECOMPRESSION MICRODISCECTOMY;  Surgeon: Eustace Moore, MD;  Location: [place] [facility] NEURO ORS;  Service: Neurosurgery;  Laterality: N/A;  Lumbar Two, Three, Four, Five Decompressive Lumbar Laminectomies  . MCH GI BLEED  02/07-02/12/2002  . MCH SOB  10/11-10/18/2007   A fib, CHF  . MCH x  11/08-11/03/2006   Acute blood loss, anemia, sys HF, isch cardiomyopathy  . [place] and 1995    OB History    No data available       Home Medications    Prior to Admission medications   Medication Sig Start Date End  Date Taking? Authorizing Provider  acetaminophen (TYLENOL) 500 MG tablet Take 500 mg by mouth every 6 (six) hours as needed for mild pain.    Yes Historical Provider, MD  allopurinol (ZYLOPRIM) 100 MG tablet TAKE 1 TABLET DAILY 05/08/15  Yes Amy Cletis [place], MD  aspirin EC 81 MG tablet Take 81 mg by mouth 2 (two) times daily.   Yes Historical Provider, MD  atorvastatin (LIPITOR) 20 MG tablet TAKE 1 TABLET DAILY AT     BEDTIME 08/26/15  Yes Amy E Bedsole, MD  AVODART 0.5 MG capsule TAKE 1 CAPSULE DAILY 04/17/13  Yes Amy E Bedsole, MD  carvedilol (COREG) 3.125 MG tablet TAKE 1 TABLET TWICE A DAY  WITH MEALS 07/31/15  Yes Jolaine Artist, MD  Cholecalciferol (TH VITAMIN D3) 2000 UNITS CAPS Take 2,000 Units by  mouth daily.    Yes Historical Provider, MD  dexlansoprazole (DEXILANT) 60 MG capsule Take 1 capsule (60 mg total) by mouth daily. 01/24/15  Yes Amy E Bedsole, MD  donepezil (ARICEPT) 5 MG tablet Take 5-10 mg by mouth at bedtime.   Yes Historical Provider, MD  eplerenone (INSPRA) 25 MG tablet Take 0.5 tablets (12.5 mg total) by mouth daily. 08/05/15  Yes Jolaine Artist, MD  furosemide (LASIX) 40 MG tablet TAKE 1 TABLET DAILY 01/11/16  Yes Amy E Bedsole, MD  gabapentin (NEURONTIN) 300 MG capsule TAKE 1 CAPSULE 4 TIMES     DAILY Patient taking differently: Take 300 mg by mouth 2 (two) times daily.  08/02/15  Yes Amy Cletis [place], MD  guaiFENesin (MUCINEX) 600 MG 12 hr tablet Take 1,200 mg by mouth 2 (two) times daily as needed for cough.    Yes Historical Provider, MD  lisinopril (PRINIVIL,ZESTRIL) 2.5 MG tablet Take 1 tablet (2.5 mg total) by mouth daily. 08/14/14  Yes Jolaine Artist, MD  potassium chloride SA (KLOR-CON M20) 20 MEQ tablet Take 1 tablet (20 mEq total) by mouth 2 (two) times daily. 10/22/14  Yes Jolaine Artist, MD  PROAIR HFA 108 [phone] Base) MCG/ACT inhaler FOR DIRECTIONS ON HOW TO   TAKE THIS MEDICINE, READ   THE ENCLOSED MEDICATION    INFORMATION FORM Patient taking differently:  INHALE 2 PUFFS EVERY MORNING, MAY ALSO INHALE 2 PUFFS AT BEDTIME AS NEEDED FOR SHORTNESS OF BREATH 06/22/15  Yes Amy E Bedsole, MD  SPIRIVA HANDIHALER 18 MCG inhalation capsule INHALE THE CONTENTS OF ONE CAPSULE DAILY IN THE       MORNING VIA HANDHIHALER    DEVICE 03/13/15  Yes Amy E Bedsole, MD  tamsulosin (FLOMAX) 0.4 MG CAPS capsule Take 1 capsule (0.4 mg total) by mouth daily after supper. 08/01/15  Yes Amy Cletis [place], MD  clotrimazole (LOTRIMIN) 1 % cream Apply 1 application topically 2 (two) times daily. Patient not taking: Reported on 01/25/2016 11/30/14   Amy E Diona Browner, MD  donepezil (ARICEPT) 10 MG tablet Take 1 tablet (10 mg total) by mouth at bedtime. 01/24/16   Jinny Sanders, MD    Family History Family History  Problem Relation Age of Onset  . Heart failure Mother     CHF  . Heart failure Father     CHF  . Hypertension Sister   . Obesity Sister   . Cancer Brother     lung cancer  . Lung cancer Brother   . Liver disease Brother     ETOH  . Alcohol abuse Brother   . Cancer Brother   . COPD Brother   . Cancer Brother   . Anxiety disorder Brother   . COPD Brother   . Hypertension Sister     Social History Social History  Substance Use Topics  . Smoking status: Current Every Day Smoker    Packs/day: 1.00    Years: 60.00    Types: Cigarettes  . Smokeless tobacco: Never Used     Comment: Smoker since 29, quit for 2 years and started back in 03/2008  . Alcohol use 0.0 oz/week     Allergies   Review of patient's allergies indicates no known allergies.   Review of Systems Review of Systems  All other systems reviewed and are negative.    Physical Exam Updated Vital Signs BP (!) 122/53 (BP Location: Left Arm)   Pulse 72   Temp 98.1 F (36.7 C) (Oral)  Resp 18   Ht 5\' 6"  (1.676 m)   Wt 172 lb 9.9 oz (78.3 kg)   SpO2 100%   BMI 27.86 kg/m   Physical Exam  Constitutional: He is oriented to person, place, and time. He appears well-developed and  well-nourished.  HENT:  Head: Normocephalic and atraumatic.  Cardiovascular: Normal rate and regular rhythm.   No murmur heard. Pulmonary/Chest: Effort normal and breath sounds normal. No respiratory distress.  Abdominal: Soft. There is no tenderness. There is no rebound and no guarding.  Musculoskeletal: He exhibits no edema or tenderness.  Neurological: He is alert and oriented to person, place, and time.  Skin: Skin is warm and dry.  Psychiatric: He has a normal mood and affect. His behavior is normal.  Nursing note and vitals reviewed.    ED Treatments / Results  Labs (all labs ordered are listed, but only abnormal results are displayed) Labs Reviewed  COMPREHENSIVE METABOLIC PANEL - Abnormal; Notable for the following:       Result Value   Glucose, Bld 165 (*)    BUN 25 (*)    Creatinine, Ser 1.46 (*)    Total Protein 6.4 (*)    ALT 11 (*)    GFR calc non Af Amer 43 (*)    GFR calc Af Amer 49 (*)    All other components within normal limits  CBC - Abnormal; Notable for the following:    RBC 2.73 (*)    Hemoglobin 6.2 (*)    HCT 20.6 (*)    MCV 75.5 (*)    MCH 22.7 (*)    RDW 17.6 (*)    All other components within normal limits  RETICULOCYTES - Abnormal; Notable for the following:    RBC. 2.75 (*)    All other components within normal limits  GLUCOSE, CAPILLARY - Abnormal; Notable for the following:    Glucose-Capillary 108 (*)    All other components within normal limits  CREATININE, URINE, RANDOM  IRON AND TIBC  FERRITIN  SAVE SMEAR  PATHOLOGIST SMEAR REVIEW  UREA NITROGEN, URINE  BASIC METABOLIC PANEL  CBC  POC OCCULT BLOOD, ED  TYPE AND SCREEN  PREPARE RBC (CROSSMATCH)    EKG  EKG Interpretation  Date/Time:  Saturday January 25 2016 14:17:47 EDT Ventricular Rate:  70 PR Interval:    QRS Duration: 170 QT Interval:  488 QTC Calculation: 527 R Axis:   -81 Text Interpretation:  ventricular-paced complexes Ventricular premature complex Confirmed  by Hazle Coca [phone]) on 01/25/2016 2:30:36 PM       Radiology Dg Chest 2 View  Result Date: 01/25/2016 CLINICAL DATA:  Headache, bilateral leg weakness and shortness of breath. EXAM: CHEST  2 VIEW COMPARISON:  08/24/2014. FINDINGS: Trachea is midline. Heart size normal. Thoracic aorta is calcified. Pacemaker and ICD lead tips are stable in position. Mild scattered scarring in the lungs. No airspace consolidation or pleural fluid. IMPRESSION: No acute findings. Electronically Signed   By: Lorin Picket M.D.   On: 01/25/2016 14:47   Ct Head Wo Contrast  Result Date: 01/25/2016 CLINICAL DATA:  HEADACHES PMH: CHF, DM, HTN, COPD (chronic obstructive pulmonary disease) ([place]); Anemia; Hyperlipidemia; Left bundle branch block; Prostatic hypertrophy; Obesity; CAD (coronary artery disease); Atrial fibrillation ([place]); cardiac arrest -ventricular fibrillation. Dyspnea, pneumonia, GERD, arthritis, chronic neck pain, cervical radiculopathy EXAM: CT HEAD WITHOUT CONTRAST TECHNIQUE: Contiguous axial images were obtained from the base of the skull through the vertex without intravenous contrast. COMPARISON:  CT head dated 08/30/2014.  FINDINGS: Brain: Again noted is generalized age related parenchymal atrophy with commensurate dilatation of the ventricles and sulci. Mild chronic small vessel ischemic change again noted within the deep periventricular white matter regions bilaterally. There is no mass, hemorrhage, edema or other evidence of acute parenchymal abnormality. No extra-axial hemorrhage. Vascular: No hyperdense vessel or unexpected calcification. Skull: Normal. Negative for fracture or focal lesion. Sinuses/Orbits: No acute finding. Other: None. IMPRESSION: No acute findings.  No intracranial mass, hemorrhage or edema. Electronically Signed   By: Franki Cabot M.D.   On: 01/25/2016 17:00    Procedures Procedures (including critical care time)  Medications Ordered in ED Medications  0.9 %  sodium  chloride infusion (not administered)  donepezil (ARICEPT) tablet 10 mg (not administered)  atorvastatin (LIPITOR) tablet 20 mg (not administered)  spironolactone (ALDACTONE) tablet 25 mg (not administered)  gabapentin (NEURONTIN) capsule 300 mg (not administered)  tamsulosin (FLOMAX) capsule 0.4 mg (not administered)  carvedilol (COREG) tablet 3.125 mg (not administered)  allopurinol (ZYLOPRIM) tablet 100 mg (not administered)  tiotropium (SPIRIVA) inhalation capsule 18 mcg (not administered)  pantoprazole (PROTONIX) EC tablet 80 mg (not administered)  potassium chloride SA (K-DUR,KLOR-CON) CR tablet 20 mEq (not administered)  dutasteride (AVODART) capsule 0.5 mg (not administered)  hydrALAZINE (APRESOLINE) injection 10 mg (not administered)  sodium chloride flush (NS) 0.9 % injection 3 mL (not administered)  acetaminophen (TYLENOL) tablet 650 mg (not administered)    Or  acetaminophen (TYLENOL) suppository 650 mg (not administered)  insulin aspart (novoLOG) injection 0-15 Units (not administered)  albuterol (PROVENTIL) (2.5 MG/3ML) 0.083% nebulizer solution 2.5 mg (not administered)  furosemide (LASIX) injection 20 mg (not administered)     Initial Impression / Assessment and Plan / ED Course  I have reviewed the triage vital signs and the nursing notes.  Pertinent labs & imaging results that were available during my care of the patient were reviewed by me and considered in my medical decision making (see chart for details).  Clinical Course    Patient here for evaluation of progressive dizziness and weakness. CBC with significant anemia, patient with hx/o similar prior episodes. Plan to transfuse. Hospitalist consulted for admission and further treatment. CT scan obtained given the onset of headaches.  Final Clinical Impressions(s) / ED Diagnoses   Final diagnoses:  None    New Prescriptions Current Discharge Medication List       Quintella Reichert, MD 01/25/16 1721

## 2016-01-25 NOTE — ED Notes (Signed)
Report called to rn on 5 w 

## 2016-01-25 NOTE — ED Triage Notes (Signed)
Family reports that they received a call from Dr Bevelyn Ngo office and told them that his Hbg is less than 6. Pt has been complaining of being dizziness.

## 2016-01-25 NOTE — H&P (Addendum)
[facility] History and Physical  [name] [id number] DOB: Oct 02, 1930 DOA: 01/25/2016  Referring physician:  PCP: Eliezer Lofts, MD   Chief Complaint: "My doctor told me to come to the ER."  HPI: [name] is a 82 y.o. male past medical history of anemia, severe GI bleed, atrial fibrillation, blood clot, prostate cancer and blood transfusions presents to the emergency room with a chief complaint of abnormal lab value. Patient states that he went to his primary care doctor for a regular visit. He had no complaints. Lab work was taken and he received a call today stating that he needed to go the hospital for blood. Per his daughter his "blood level was 6".    Review of Systems:  As per HPI otherwise 10 point review of systems negative.    Past Medical History:  Diagnosis Date  . Anemia    iron deficiency anemia with previous severe GI bleed   . Arthritis    back and knees  . Atrial fibrillation ([place])    s/p AV node ablation; not on coumadin due to GIB  . 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 CAD (coronary artery disease)    Mild, nonobstructive (LHC 1/07: mLAD 20%, pCFX 20-30%, mRCA 30%, EF 25%)  . Cancer [facility])    prostate  . Cardiac arrest - ventricular fibrillation 02/2008   Aborted, shocked by ICD  . Cervical radiculopathy    left  . CHF (congestive heart failure) (HCC)    secondary to nonischemic cardiomyopathy; ECHO 10/07 EF 20-25%, mod to severe MR; ECHO 1/10 EF 55-60%, mild MR; ECHO 2/11 55-65%, grade 1 diast dysfxn, miod to mod LAE, mild TR;    S/P Medtronic BiV ICD with biventricular function now turned off due to diahragmatic simulation  . Chronic neck pain   . COPD (chronic obstructive pulmonary disease) (HCC)    GOLD II; Spirometry 07/10/2008 >FEV1 1.46 56% predicted, ratio of 66%  . Diabetes mellitus    type 2 NIDDM x 5-6 yrs  . Dyspnea    exertional  . GERD (gastroesophageal reflux disease)   . Hyperlipidemia   .  Hypertension   . Inguinal hernia    left; asymptomatic  . Left bundle branch block   . Obesity   . Pneumonia    01/2011  . Prostatic hypertrophy 09-11-97   Benign  . Torticollis    Past Surgical History:  Procedure Laterality Date  . ANTERIOR CERVICAL DISCECTOMY  02/2010  . BIV ICD GENERTAOR CHANGE OUT N/A 02/08/2013   Procedure: BIV ICD GENERTAOR CHANGE OUT;  Surgeon: Deboraha Sprang, MD;  Location: [facility] CATH LAB;  Service: Cardiovascular;  Laterality: N/A;  . CARDIAC CATHETERIZATION  01/2006   Showed mild nonobstructive CAD  . CARDIAC CATHETERIZATION  11/05/2006   Right atrial pressure mean of 12, RV pressure 36/8, PA pressure 39/16 witha mean of 28, wedge pressure was 20. Fick cardiac output was 5 liters per minute, cardiac index was 2.4  . COLONOSCOPY  06/26/2002   Multip (neg) divertics, int hemm  . COLONOSCOPY  07/10/2004   Poyps, divertics, int hemms  . COLONOSCOPY  06/19/2008   2 polyps divertics int hemms (Dr Henrene Pastor)  . COLONOSCOPY W/ POLYPECTOMY  11/1997   Divertics, int hemms  . CORONARY ANGIOPLASTY     Min abstrut zd severe LB dystn EF 25%  . CT HEAD LIMITED W/O CM  10/03/2006   No acute abnmlty  . ESOPHAGOGASTRODUODENOSCOPY  09/1997   Prepylor ulcer, esoph ring, duod avm  . ESOPHAGOGASTRODUODENOSCOPY  05/22/2002   Poss Barrett's   . ESOPHAGOGASTRODUODENOSCOPY  02/19/2006   HH No active bleeding  . EYE SURGERY     cataract, bilateral  . HOSP  8/14-8/23/2008   Acute on chronis CHF IIIB NOnisch Cardiomyop EF 20-25% Mod-Sev MR  . INSERT / REPLACE / REMOVE PACEMAKER  2007  . KNEE ARTHROSCOPY  1988  . KNEE SURGERY  1996   Left  . LOBECTOMY  01-21-98   lung  . LUMBAR LAMINECTOMY/DECOMPRESSION MICRODISCECTOMY  04/29/2011   Procedure: LUMBAR LAMINECTOMY/DECOMPRESSION MICRODISCECTOMY;  Surgeon: Eustace Moore, MD;  Location: [place] NEURO ORS;  Service: Neurosurgery;  Laterality: N/A;  Lumbar Two, Three, Four, Five Decompressive Lumbar Laminectomies  . [facility] GI BLEED   02/07-02/12/2002  . [facility] SOB  10/11-10/18/2007   A fib, CHF  . [facility] x  11/08-11/03/2006   Acute blood loss, anemia, sys HF, isch cardiomyopathy  . ROTATOR CUFF REPAIR  1994 and 1995   Social History:  reports that he has been smoking Cigarettes.  He has a 60.00 pack-year smoking history. He has never used smokeless tobacco. He reports that he drinks alcohol. He reports that he does not use drugs.  No Active Allergies  Family History  Problem Relation Age of Onset  . Heart failure Mother     CHF  . Heart failure Father     CHF  . Hypertension Sister   . Obesity Sister   . Cancer Brother     lung cancer  . Lung cancer Brother   . Liver disease Brother     ETOH  . Alcohol abuse Brother   . Cancer Brother   . COPD Brother   . Cancer Brother   . Anxiety disorder Brother   . COPD Brother   . Hypertension Sister      Prior to Admission medications   Medication Sig Start Date End Date Taking? Authorizing Provider  acetaminophen (TYLENOL) 500 MG tablet Take 500 mg by mouth every 6 (six) hours as needed for mild pain.     Historical Provider, MD  allopurinol (ZYLOPRIM) 100 MG tablet TAKE 1 TABLET DAILY 05/08/15   Amy Cletis Athens, MD  aspirin 81 MG tablet Take 81 mg by mouth 2 (two) times daily.    Historical Provider, MD  atorvastatin (LIPITOR) 20 MG tablet TAKE 1 TABLET DAILY AT     BEDTIME 08/26/15   Amy E Diona Browner, MD  AVODART 0.5 MG capsule TAKE 1 CAPSULE DAILY 04/17/13   Amy Cletis Athens, MD  carvedilol (COREG) 3.125 MG tablet TAKE 1 TABLET TWICE A DAY  WITH MEALS 07/31/15   Jolaine Artist, MD  Cholecalciferol (TH VITAMIN D3) 2000 UNITS CAPS Take 2,000 Units by mouth daily.     Historical Provider, MD  clotrimazole (LOTRIMIN) 1 % cream Apply 1 application topically 2 (two) times daily. 11/30/14   Amy Cletis Athens, MD  dexlansoprazole (DEXILANT) 60 MG capsule Take 1 capsule (60 mg total) by mouth daily. 01/24/15   Amy Cletis Athens, MD  donepezil (ARICEPT) 10 MG tablet Take 1 tablet (10 mg  total) by mouth at bedtime. 01/24/16   Amy Cletis Athens, MD  eplerenone (INSPRA) 25 MG tablet Take 0.5 tablets (12.5 mg total) by mouth daily. 08/05/15   Jolaine Artist, MD  furosemide (LASIX) 40 MG tablet TAKE 1 TABLET DAILY 01/11/16   Amy Cletis Athens, MD  gabapentin (NEURONTIN) 300 MG capsule TAKE 1 CAPSULE  4 TIMES     DAILY Patient taking differently: Take 300 mg by mouth 4 (four) times daily. TAKE 1 CAPSULE 4 TIMES     DAILY 08/02/15   Amy Cletis Athens, MD  guaiFENesin (MUCINEX) 600 MG 12 hr tablet Take 1,200 mg by mouth 2 (two) times daily as needed for cough.     Historical Provider, MD  lisinopril (PRINIVIL,ZESTRIL) 2.5 MG tablet Take 1 tablet (2.5 mg total) by mouth daily. 08/14/14   Jolaine Artist, MD  potassium chloride SA (KLOR-CON M20) 20 MEQ tablet Take 1 tablet (20 mEq total) by mouth 2 (two) times daily. 10/22/14   Jolaine Artist, MD  PROAIR HFA 108 [phone] Base) MCG/ACT inhaler FOR DIRECTIONS ON HOW TO   TAKE THIS MEDICINE, READ   THE ENCLOSED MEDICATION    INFORMATION FORM 06/22/15   Amy Cletis Athens, MD  SPIRIVA HANDIHALER 18 MCG inhalation capsule INHALE THE CONTENTS OF ONE CAPSULE DAILY IN THE       MORNING VIA HANDHIHALER    DEVICE 03/13/15   Amy Cletis Athens, MD  tamsulosin (FLOMAX) 0.4 MG CAPS capsule Take 1 capsule (0.4 mg total) by mouth daily after supper. 08/01/15   Jinny Sanders, MD   Physical Exam: Vitals:   01/25/16 1338 01/25/16 1339 01/25/16 1341 01/25/16 1417  BP:   108/81 104/60  Pulse: 75   71  Resp: 18   17  Temp: 97.9 F (36.6 C)     TempSrc: Oral     SpO2: 100%   100%  Weight:  79.4 kg (175 lb)    Height:  5\' 7"  (1.702 m)      Wt Readings from Last 3 Encounters:  01/25/16 79.4 kg (175 lb)  01/24/16 79.3 kg (174 lb 12 oz)  09/11/15 80.1 kg (176 lb 8 oz)    General:  Appears calm and comfortable Eyes:  PERRL, EOMI, normal lids, iris ENT:  grossly normal hearing, lips & tongue; Edentulous Neck:  no LAD, masses or thyromegaly Cardiovascular:  RRR, no m/r/g. No  LE edema.  Respiratory:  CTA bilaterally, no w/r/r. Normal respiratory effort. Abdomen:  soft, ntnd Skin:  no rash or induration seen on limited exam Musculoskeletal:  grossly normal tone BUE/BLE Psychiatric:  grossly normal mood and affect, speech fluent and appropriate Neurologic:  CN 2-12 grossly intact, moves all extremities in coordinated fashion. alert and oriented 3           Labs on Admission:  Basic Metabolic Panel:  Recent Labs Lab 01/24/16 1700 01/25/16 1341  NA 139 136  K 4.8 3.9  CL 106 105  CO2 22 22  GLUCOSE 91 165*  BUN 31* 25*  CREATININE 1.49* 1.46*  CALCIUM 8.9 9.1   Liver Function Tests:  Recent Labs Lab 01/24/16 1700 01/25/16 1341  AST 13 16  ALT 9 11*  ALKPHOS 108 112  BILITOT 0.4 0.5  PROT 6.4 6.4*  ALBUMIN 4.0 3.7   No results for input(s): LIPASE, AMYLASE in the last 168 hours. No results for input(s): AMMONIA in the last 168 hours. CBC:  Recent Labs Lab 01/24/16 1700 01/25/16 1341  WBC 9.2 10.0  NEUTROABS 5,244  --   HGB 6.5* 6.2*  HCT 20.5* 20.6*  MCV 73.7* 75.5*  PLT 264 263   Cardiac Enzymes: No results for input(s): CKTOTAL, CKMB, CKMBINDEX, TROPONINI in the last 168 hours.  BNP (last 3 results) No results for input(s): BNP in the last 8760 hours.  ProBNP (last 3  results) No results for input(s): PROBNP in the last 8760 hours.   Serum creatinine: 1.46 mg/dL High 01/25/16 1341 Estimated creatinine clearance: 31.7 mL/min (Male) Estimated creatinine clearance: 38.7 mL/min (Male)  CBG: No results for input(s): GLUCAP in the last 168 hours.  Radiological Exams on Admission: Dg Chest 2 View  Result Date: 01/25/2016 CLINICAL DATA:  Headache, bilateral leg weakness and shortness of breath. EXAM: CHEST  2 VIEW COMPARISON:  08/24/2014. FINDINGS: Trachea is midline. Heart size normal. Thoracic aorta is calcified. Pacemaker and ICD lead tips are stable in position. Mild scattered scarring in the lungs. No airspace  consolidation or pleural fluid. IMPRESSION: No acute findings. Electronically Signed   By: Lorin Picket M.D.   On: 01/25/2016 14:47    EKG: Independently reviewed. Rate 70, QRS 170, ventricular paced rhythm, no STEMI no acute changes when compared to November 2016 EKG  Assessment/Plan Principal Problem:   Blood loss anemia Active Problems:   Hyperlipidemia   COPD (chronic obstructive pulmonary disease) (HCC)   Chronic diastolic heart failure (HCC)   Atrial fibrillation (HCC)   Essential hypertension, benign   Type 2 diabetes mellitus with diabetic neuropathy (HCC)  Blood loss anemia This is a recurrent issue is chronic for the patient Patient get 2 units of blood Patient has had several workups for this issue that yielded no results Blood smear ordered along with iron, retic and ferritin We'll give a dose Lasix in between units of blood Diet clear liquid in case patient needs to be scoped  AKI Kidneys normal at baseline  Avoid nephrotoxic drugs Holding lisinopril and Lasix Creatinine 1.46 on admission Likely due to anemia No sx of rentention  HA Tylenol prn Ct ordered in ED, pending  Hyperlipidemia Continue statin  Hypertension When necessary hydralazine 10 mg IV as needed for severe blood pressure Continue Inspra-> spironolactone Continue Coreg  BPH  continue Flomax and Avodart  DM SSI  Dementia  continue Aricept  COPD  continue albuterol as needed Daily Spiriva  Gout Continue allopurinol  GERD Continue Dexilant--> pantoprazole  CHF Holding Lasix  Difficulty swallowing Speech therapy  Inpt Tele  Code Status: DNR/DNI DVT Prophylaxis: scd Family Communication: Daughter at bedside Disposition Plan: Pending Improvement, geriatrics referral on discharge    Elwin Mocha, MD Family Medicine [facility] [email or web address] Password [email or web address]

## 2016-01-25 NOTE — ED Notes (Signed)
Blood antibody  Blood ready as of 10 minutes ago

## 2016-01-26 ENCOUNTER — Telehealth: Payer: Self-pay | Admitting: Internal Medicine

## 2016-01-26 DIAGNOSIS — N179 Acute kidney failure, unspecified: Secondary | ICD-10-CM | POA: Diagnosis not present

## 2016-01-26 DIAGNOSIS — D508 Other iron deficiency anemias: Secondary | ICD-10-CM

## 2016-01-26 DIAGNOSIS — I10 Essential (primary) hypertension: Secondary | ICD-10-CM | POA: Diagnosis not present

## 2016-01-26 DIAGNOSIS — D500 Iron deficiency anemia secondary to blood loss (chronic): Secondary | ICD-10-CM

## 2016-01-26 LAB — BASIC METABOLIC PANEL
Anion gap: 6 (ref 5–15)
BUN: 20 mg/dL (ref 6–20)
CHLORIDE: 107 mmol/L (ref 101–111)
CO2: 26 mmol/L (ref 22–32)
CREATININE: 1.2 mg/dL (ref 0.61–1.24)
Calcium: 8.8 mg/dL — ABNORMAL LOW (ref 8.9–10.3)
GFR calc Af Amer: >60 mL/min (ref >60)
GFR calc non Af Amer: 54 mL/min — ABNORMAL LOW (ref >60)
GLUCOSE: 101 mg/dL — AB (ref 65–99)
POTASSIUM: 4.4 mmol/L (ref 3.5–5.1)
SODIUM: 139 mmol/L (ref 135–145)

## 2016-01-26 LAB — CBC
HEMATOCRIT: 26.9 % — AB (ref 39.0–52.0)
Hemoglobin: 8.4 g/dL — ABNORMAL LOW (ref 13.0–17.0)
MCH: 24.4 pg — ABNORMAL LOW (ref 26.0–34.0)
MCHC: 31.2 g/dL (ref 30.0–36.0)
MCV: 78.2 fL (ref 78.0–100.0)
Platelets: 222 10*3/uL (ref 150–400)
RBC: 3.44 MIL/uL — ABNORMAL LOW (ref 4.22–5.81)
RDW: 17.6 % — ABNORMAL HIGH (ref 11.5–15.5)
WBC: 6.6 10*3/uL (ref 4.0–10.5)

## 2016-01-26 LAB — UREA NITROGEN, URINE: Urea Nitrogen, Ur: 302 mg/dL

## 2016-01-26 LAB — GLUCOSE, CAPILLARY: Glucose-Capillary: 97 mg/dL (ref 65–99)

## 2016-01-26 MED ORDER — FERROUS SULFATE 325 (65 FE) MG PO TABS: 325.0000 mg | ORAL_TABLET | Freq: Two times a day (BID) | ORAL | 3 refills | Status: DC

## 2016-01-26 NOTE — Telephone Encounter (Signed)
Patient admitted to hospital with Hgb 6 and low ferritin and no signs of bleeding though hemoccult was pending   Discussed w/ hospitalist - patient better after blood and desires to go home as wife terminal in hospice  History of multiple w/u in past it seems though last colonoscopy 2010  B12 low NL now also  Advised would coordinate outpatient f/u -   We can see him in GI office electively if desired - though will check with PCP first re: treatment alone vs considering evaluation of recurrent anemia and prior unrevealing work-ups going back 10 years or more

## 2016-01-26 NOTE — Discharge Summary (Signed)
Physician Discharge Summary  [name] [id number] DOB: 03/16/1931 DOA: 01/25/2016  PCP: Eliezer Lofts, MD  Admit date: 01/25/2016 Discharge date: 01/26/2016  Admitted From: Home Disposition:  Home  Recommendations for Outpatient Follow-up:  1. Follow up with PCP in 1 week. Recheck hemoglobin during follow-up. 2. Please refer to [facility] if further evaluation for recurrent anemia is warranted.  Home Health: None Equipment/Devices: None  Discharge Condition: Fair CODE STATUS: Full code Diet recommendation: Heart Healthy     Discharge Diagnoses:  Principal Problem:   Blood loss anemia   Active Problems:   Hyperlipidemia   Anemia, iron deficiency   COPD (chronic obstructive pulmonary disease) (HCC)   Chronic diastolic heart failure (HCC)   Atrial fibrillation (HCC)   Essential hypertension, benign   Type 2 diabetes mellitus with diabetic neuropathy (HCC)   AKI (acute kidney injury) ([place])  Brief narrative/history of present illness 82 year old male with history of iron deficiency anemia, A. fib not an antegrade relation due to GI bleed, multiple workup for blood loss anemia including EGDs, colonoscopy and capsule studies over the past 15 years without significant finding, history of prostate cancer, COPD, was sent to the ED by PCP after he was found to have hemoglobin of 6. Patient denies any headache, dizziness, blurred vision, nausea, vomiting, chest pain, palpitations, shortness of breath, weakness, hematemesis, melena, hematuria. Denies any weight loss. In the ED vitals were stable. Hemoglobin was 6.2 and was just on observation on telemetry. Order for 2 unit PRBC.  Hospital course Blood loss/severe iron deficiency  anemia His hemoglobin in the past one year has been in the range of 11. Patient informs that he was taken off iron supplement by his PCP since his hemoglobin was stable. Patient transfused 2 units PRBC. Stable on telemetry overnight. -Hemoglobin  improved to 8.4. He remains asymptomatic. on reviewing the chart, he has had multiple EGD, colonoscopy and capsule study which have been unrevealing for blood loss. -I discussed with [facility] Dr. Carlean Purl and since patient is asymptomatic with improvement in his improving and wishes to go home to take care of his wife was on home hospice, he agreed patient could be discharged home and could be seen in the office as needed. -I will discharge him on iron supplement. -He is instructed to follow-up with his PCP in 1 week and have his hemoglobin rechecked.   Acute kidney injury Possibly associated with anemia. Resolved with transfusion and IV fluids.  Low normal vitamin B12 (291)  consider adding supplement as outpatient.  Essential hypertension Stable. Resume home medications.  Remaining medical conditions are stable.  Family communication: Daughter at bedside  Disposition: Home   Discharge Instructions     Medication List    STOP taking these medications   clotrimazole 1 % cream Commonly known as:  LOTRIMIN     TAKE these medications   allopurinol 100 MG tablet Commonly known as:  ZYLOPRIM TAKE 1 TABLET DAILY   aspirin EC 81 MG tablet Take 81 mg by mouth 2 (two) times daily.   atorvastatin 20 MG tablet Commonly known as:  LIPITOR TAKE 1 TABLET DAILY AT     BEDTIME   AVODART 0.5 MG capsule Generic drug:  dutasteride TAKE 1 CAPSULE DAILY   carvedilol 3.125 MG tablet Commonly known as:  COREG TAKE 1 TABLET TWICE A DAY  WITH MEALS   dexlansoprazole 60 MG capsule Commonly known as:  DEXILANT Take 1 capsule (60 mg total) by mouth daily.   donepezil 10  MG tablet Commonly known as:  ARICEPT Take 1 tablet (10 mg total) by mouth at bedtime. What changed:  Another medication with the same name was removed. Continue taking this medication, and follow the directions you see here.   eplerenone 25 MG tablet Commonly known as:  INSPRA Take 0.5 tablets (12.5 mg total) by  mouth daily.   ferrous sulfate 325 (65 FE) MG tablet Take 1 tablet (325 mg total) by mouth 2 (two) times daily with a meal.   furosemide 40 MG tablet Commonly known as:  LASIX TAKE 1 TABLET DAILY   gabapentin 300 MG capsule Commonly known as:  NEURONTIN TAKE 1 CAPSULE 4 TIMES     DAILY What changed:  how much to take  how to take this  when to take this  additional instructions   guaiFENesin 600 MG 12 hr tablet Commonly known as:  MUCINEX Take 1,200 mg by mouth 2 (two) times daily as needed for cough.   lisinopril 2.5 MG tablet Commonly known as:  PRINIVIL,ZESTRIL Take 1 tablet (2.5 mg total) by mouth daily.   potassium chloride SA 20 MEQ tablet Commonly known as:  KLOR-CON M20 Take 1 tablet (20 mEq total) by mouth 2 (two) times daily.   PROAIR HFA 108 (90 Base) MCG/ACT inhaler Generic drug:  albuterol FOR DIRECTIONS ON HOW TO   TAKE THIS MEDICINE, READ   THE ENCLOSED MEDICATION    INFORMATION FORM What changed:  See the new instructions.   SPIRIVA HANDIHALER 18 MCG inhalation capsule Generic drug:  tiotropium INHALE THE CONTENTS OF ONE CAPSULE DAILY IN THE       MORNING VIA HANDHIHALER    DEVICE   tamsulosin 0.4 MG Caps capsule Commonly known as:  FLOMAX Take 1 capsule (0.4 mg total) by mouth daily after supper.   TH VITAMIN D3 2000 units Caps Generic drug:  Cholecalciferol Take 2,000 Units by mouth daily.   TYLENOL 500 MG tablet Generic drug:  acetaminophen Take 500 mg by mouth every 6 (six) hours as needed for mild pain.      Follow-up Information    Eliezer Lofts, MD. Schedule an appointment as soon as possible for a visit in 1 week(s).   Specialty:  Family Medicine Contact information: [place] [address] [phone]        Silvano Rusk, MD. Call in 3 day(s).   Specialty:  Gastroenterology Why:  OFFICE WILL CALL Contact information: [address] [address] [phone]          No Known  Allergies    Procedures/Studies: Dg Chest 2 View  Result Date: 01/25/2016 CLINICAL DATA:  Headache, bilateral leg weakness and shortness of breath. EXAM: CHEST  2 VIEW COMPARISON:  08/24/2014. FINDINGS: Trachea is midline. Heart size normal. Thoracic aorta is calcified. Pacemaker and ICD lead tips are stable in position. Mild scattered scarring in the lungs. No airspace consolidation or pleural fluid. IMPRESSION: No acute findings. Electronically Signed   By: Lorin Picket M.D.   On: 01/25/2016 14:47   Ct Head Wo Contrast  Result Date: 01/25/2016 CLINICAL DATA:  HEADACHES PMH: CHF, DM, HTN, COPD (chronic obstructive pulmonary disease) ([place]); Anemia; Hyperlipidemia; Left bundle branch block; Prostatic hypertrophy; Obesity; CAD (coronary artery disease); Atrial fibrillation ([place]); cardiac arrest -ventricular fibrillation. Dyspnea, pneumonia, GERD, arthritis, chronic neck pain, cervical radiculopathy EXAM: CT HEAD WITHOUT CONTRAST TECHNIQUE: Contiguous axial images were obtained from the base of the skull through the vertex without intravenous contrast. COMPARISON:  CT head dated 08/30/2014. FINDINGS: Brain: Again noted is generalized age related parenchymal atrophy with commensurate dilatation of the ventricles and sulci. Mild chronic small vessel ischemic change again noted within the deep periventricular white matter regions bilaterally. There is no mass, hemorrhage, edema or other evidence of acute parenchymal abnormality. No extra-axial hemorrhage. Vascular: No hyperdense vessel or unexpected calcification. Skull: Normal. Negative for fracture or focal lesion. Sinuses/Orbits: No acute finding. Other: None. IMPRESSION: No acute findings.  No intracranial mass, hemorrhage or edema. Electronically Signed   By: Franki Cabot M.D.   On: 01/25/2016 17:00       Subjective: Received 2 unit PRBC. Denies any symptoms.  Discharge Exam: Vitals:   01/26/16 0151 01/26/16 0526  BP: 139/74 111/69   Pulse: (!) 102 79  Resp: 20 20  Temp: 97.5 F (36.4 C) 98.1 F (36.7 C)   Vitals:   01/25/16 2248 01/26/16 0151 01/26/16 0526 01/26/16 0833  BP: (!) 117/57 139/74 111/69   Pulse: (!) 50 (!) 102 79   Resp: 20 20 20    Temp: 98.2 F (36.8 C) 97.5 F (36.4 C) 98.1 F (36.7 C)   TempSrc: Oral Oral    SpO2: (!) 85% 98% 100% 99%  Weight:      Height:        General: Elderly male not in distress  HEENT: Pallor present, moist mucosa, supple neck Cardiovascular: Normal S1 and S2, no murmurs rub or gallop Respiratory: Clear bilaterally, no added sounds Abdominal: Soft, NT, ND, bowel sounds + Musculoskeletal: Warm, no edema    The results of significant diagnostics from this hospitalization (including imaging, microbiology, ancillary and laboratory) are listed below for reference.     Microbiology: No results found for this or any previous visit (from the past 240 hour(s)).   Labs: BNP (last 3 results) No results for input(s): BNP in the last 8760 hours. Basic Metabolic Panel:  Recent Labs Lab 01/24/16 1700 01/25/16 1341 01/26/16 0703  NA 139 136 139  K 4.8 3.9 4.4  CL 106 105 107  CO2 22 22 26   GLUCOSE 91 165* 101*  BUN 31* 25* 20  CREATININE 1.49* 1.46* 1.20  CALCIUM 8.9 9.1 8.8*   Liver Function Tests:  Recent Labs Lab 01/24/16 1700 01/25/16 1341  AST 13 16  ALT 9 11*  ALKPHOS 108 112  BILITOT 0.4 0.5  PROT 6.4 6.4*  ALBUMIN 4.0 3.7   No results for input(s): LIPASE, AMYLASE in the last 168 hours. No results for input(s): AMMONIA in the last 168 hours. CBC:  Recent Labs Lab 01/24/16 1700 01/25/16 1341 01/26/16 0703  WBC 9.2 10.0 6.6  NEUTROABS 5,244  --   --   HGB 6.5* 6.2* 8.4*  HCT 20.5* 20.6* 26.9*  MCV 73.7* 75.5* 78.2  PLT 264 263 222   Cardiac Enzymes: No results for input(s): CKTOTAL, CKMB, CKMBINDEX, TROPONINI in the last 168 hours. BNP: Invalid input(s): POCBNP CBG:  Recent Labs Lab 01/25/16 1656 01/25/16 2134  01/26/16 0830  GLUCAP 108* 71 97   D-Dimer No results for input(s): DDIMER in the last 72 hours. Hgb A1c  Recent Labs  01/24/16 1700  HGBA1C 6.4*   Lipid Profile No results for input(s): CHOL, HDL, LDLCALC, TRIG, CHOLHDL, LDLDIRECT in the last 72 hours. Thyroid function studies  Recent Labs  01/24/16 1700  TSH 1.80   Anemia work up  Recent Labs  01/24/16 1700 01/25/16 1341 01/25/16 1658  VITAMINB12 291  --   --  FERRITIN  --   --  10*  TIBC  --   --  458*  IRON  --   --  25*  RETICCTPCT  --  1.6  --    Urinalysis    Component Value Date/Time   COLORURINE AMBER (A) 08/30/2014 1521   APPEARANCEUR CLEAR 08/30/2014 1521   LABSPEC 1.016 08/30/2014 1521   PHURINE 7.0 08/30/2014 1521   GLUCOSEU NEGATIVE 08/30/2014 1521   HGBUR NEGATIVE 08/30/2014 1521   HGBUR negative 01/17/2007 1111   BILIRUBINUR NEGATIVE 08/30/2014 1521   BILIRUBINUR negative 08/28/2014 1335   KETONESUR NEGATIVE 08/30/2014 1521   PROTEINUR NEGATIVE 08/30/2014 1521   UROBILINOGEN 2.0 (H) 08/30/2014 1521   NITRITE NEGATIVE 08/30/2014 1521   LEUKOCYTESUR NEGATIVE 08/30/2014 1521   Sepsis Labs Invalid input(s): PROCALCITONIN,  WBC,  LACTICIDVEN Microbiology No results found for this or any previous visit (from the past 240 hour(s)).   Time coordinating discharge: < 30 minutes  SIGNED:   Louellen Molder, MD  [facility] 01/26/2016, 11:02 AM Pager   If 7PM-7AM, please contact night-coverage [email or web address] Password [email or web address]

## 2016-01-26 NOTE — Progress Notes (Signed)
Nsg Discharge Note  Admit Date:  01/25/2016 Discharge date: 01/26/2016   [name] to be D/C'd Home per MD order.  AVS completed.  Copy for chart, and copy for patient signed, and dated. Patient/caregiver able to verbalize understanding.  Discharge Medication:   Medication List    STOP taking these medications   clotrimazole 1 % cream Commonly known as:  LOTRIMIN     TAKE these medications   allopurinol 100 MG tablet Commonly known as:  ZYLOPRIM TAKE 1 TABLET DAILY   aspirin EC 81 MG tablet Take 81 mg by mouth 2 (two) times daily.   atorvastatin 20 MG tablet Commonly known as:  LIPITOR TAKE 1 TABLET DAILY AT     BEDTIME   AVODART 0.5 MG capsule Generic drug:  dutasteride TAKE 1 CAPSULE DAILY   carvedilol 3.125 MG tablet Commonly known as:  COREG TAKE 1 TABLET TWICE A DAY  WITH MEALS   dexlansoprazole 60 MG capsule Commonly known as:  DEXILANT Take 1 capsule (60 mg total) by mouth daily.   donepezil 10 MG tablet Commonly known as:  ARICEPT Take 1 tablet (10 mg total) by mouth at bedtime. What changed:  Another medication with the same name was removed. Continue taking this medication, and follow the directions you see here.   eplerenone 25 MG tablet Commonly known as:  INSPRA Take 0.5 tablets (12.5 mg total) by mouth daily.   ferrous sulfate 325 (65 FE) MG tablet Take 1 tablet (325 mg total) by mouth 2 (two) times daily with a meal.   furosemide 40 MG tablet Commonly known as:  LASIX TAKE 1 TABLET DAILY   gabapentin 300 MG capsule Commonly known as:  NEURONTIN TAKE 1 CAPSULE 4 TIMES     DAILY What changed:  how much to take  how to take this  when to take this  additional instructions   guaiFENesin 600 MG 12 hr tablet Commonly known as:  MUCINEX Take 1,200 mg by mouth 2 (two) times daily as needed for cough.   lisinopril 2.5 MG tablet Commonly known as:  PRINIVIL,ZESTRIL Take 1 tablet (2.5 mg total) by mouth daily.   potassium  chloride SA 20 MEQ tablet Commonly known as:  KLOR-CON M20 Take 1 tablet (20 mEq total) by mouth 2 (two) times daily.   PROAIR HFA 108 (90 Base) MCG/ACT inhaler Generic drug:  albuterol FOR DIRECTIONS ON HOW TO   TAKE THIS MEDICINE, READ   THE ENCLOSED MEDICATION    INFORMATION FORM What changed:  See the new instructions.   SPIRIVA HANDIHALER 18 MCG inhalation capsule Generic drug:  tiotropium INHALE THE CONTENTS OF ONE CAPSULE DAILY IN THE       MORNING VIA HANDHIHALER    DEVICE   tamsulosin 0.4 MG Caps capsule Commonly known as:  FLOMAX Take 1 capsule (0.4 mg total) by mouth daily after supper.   TH VITAMIN D3 2000 units Caps Generic drug:  Cholecalciferol Take 2,000 Units by mouth daily.   TYLENOL 500 MG tablet Generic drug:  acetaminophen Take 500 mg by mouth every 6 (six) hours as needed for mild pain.       Discharge Assessment: Vitals:   01/26/16 0151 01/26/16 0526  BP: 139/74 111/69  Pulse: (!) 102 79  Resp: 20 20  Temp: 97.5 F (36.4 C) 98.1 F (36.7 C)   Skin clean, dry and intact without evidence of skin break down, no evidence of skin tears noted. IV catheter discontinued intact. Site without signs  and symptoms of complications - no redness or edema noted at insertion site, patient denies c/o pain - only slight tenderness at site.  Dressing with slight pressure applied.  D/c Instructions-Education: Discharge instructions given to patient/family with verbalized understanding. D/c education completed with patient/family including follow up instructions, medication list, d/c activities limitations if indicated, with other d/c instructions as indicated by MD - patient able to verbalize understanding, all questions fully answered. Patient instructed to return to ED, call 911, or call MD for any changes in condition.  Patient escorted via [place], and D/C home via private auto.  [name], RN 01/26/2016 11:32 AM

## 2016-01-27 ENCOUNTER — Telehealth: Payer: Self-pay

## 2016-01-27 LAB — TYPE AND SCREEN
ABO/RH(D): O POS
ANTIBODY SCREEN: NEGATIVE
DONOR AG TYPE: NEGATIVE
Donor AG Type: NEGATIVE
UNIT DIVISION: 0
Unit division: 0

## 2016-01-27 NOTE — Telephone Encounter (Signed)
Transition Care Management Follow-up Telephone Call    Date discharged? 01/26/2016  How have you been since you were released from the hospital? [place].   Any patient concerns? None at this time.    Items Reviewed:  Medications reviewed: Yes   Allergies reviewed: NKDA  Dietary changes reviewed: N/A  Referrals reviewed: N/A   Functional Questionnaire:  Independent - I Dependent - D    Activities of Daily Living (ADLs):    Personal hygiene - I Dressing - I Eating - I Maintaining continence -I  Transferring - I (uses a cane and Hoveraround for ambulation and transfers)  Independent Activities of Daily Living (iADLs): Basic communication skills - I (pt is HOH) Transportation - D (does not drive) Meal preparation  - D (daughter takes care of meals, shopping, housework, medications, and finances) Shopping - D Housework - D  Managing medications - D Managing personal finances - D   Confirmed importance and date/time of follow-up visits scheduled N/A  Provider Appointment booked with PCP 02/04/16 @ 1515  Confirmed with patient if condition begins to worsen call PCP or go to the ER.  Patient was given the office number and encouraged to call back with question or concerns: YES

## 2016-01-27 NOTE — Telephone Encounter (Signed)
PLEASE NOTE: All timestamps contained within this report are represented as [place] Standard Time. CONFIDENTIALTY NOTICE: This fax transmission is intended only for the addressee. It contains information that is legally privileged, confidential or otherwise protected from use or disclosure. If you are not the intended recipient, you are strictly prohibited from reviewing, disclosing, copying using or disseminating any of this information or taking any action in reliance on or regarding this information. If you have received this fax in error, please notify us immediately by telephone so that we can arrange for its return to [place]. Phone: [phone], Toll-Free: [phone], Fax: [phone] Page: 1 of 2 Call Id: [id number] [place] Patient Name: [name] Gender: Male DOB: October 30, 1930 Age: 82 Y 2 M 15 D Return Phone Number: [phone] (Primary) Address: City/State/Zip:  Client [place] Night - Client Client Site [place] Physician Eliezer Lofts - MD Contact Type Call Who Is Calling Lab Lab Name [facility] Lab Phone Number [phone] option 2 Lab Tech Name Otila Kluver Lab Reference Number [id number] Chief Complaint Lab Result (Critical or Stat) Call Type Lab Send to RN Reason for Call Report lab results Initial Comment Additional Comment Translation No Nurse Assessment Nurse: Loman Brooklyn, RN, [place] Side Date/Time Eilene Ghazi Time): 01/25/2016 12:01:39 PM Is there an on-call provider listed? ---Yes Please list name of person reporting value (Lab Employee) and a contact number. ---Jannetta Quint , Manuela Schwartz , [phone] option 2 Please document the following items: Lab name Lab value (read back to lab to verify) Reference range for lab value Date and time blood was drawn Collect time of birth for bilirubin results ---Lab Name: Hemoglobin Lab value:  6.5 (Alert Low) Ref Range: 13.2-17.1 Drawn : 01/24/16 @ 1700 Please collect the patient contact information from the lab. (name, phone number and address) ---Charlynne Cousins, [phone], No address found List any special notes provided by lab. ---N/A Guidelines Guideline Title Affirmed Question Affirmed Notes Nurse Date/Time ([place] Time) Disp. Time Eilene Ghazi Time) Disposition Final User 01/25/2016 12:13:36 PM Paged On Call back to [facility], RN, Sonia Side 01/25/2016 12:18:04 PM Clinical Call Yes Loman Brooklyn, RN, [place] Side PLEASE NOTE: All timestamps contained within this report are represented as [place] Standard Time. CONFIDENTIALTY NOTICE: This fax transmission is intended only for the addressee. It contains information that is legally privileged, confidential or otherwise protected from use or disclosure. If you are not the intended recipient, you are strictly prohibited from reviewing, disclosing, copying using or disseminating any of this information or taking any action in reliance on or regarding this information. If you have received this fax in error, please notify us immediately by telephone so that we can arrange for its return to [place]. Phone: [phone], Toll-Free: [phone], Fax: [phone] Page: 2 of 2 Call Id: [id number] Paging DoctorName Phone DateTime Result/Outcome Message Type Notes Arnette Norris - MD [phone] 01/25/2016 12:13:36 PM Paged On Call Back to Call Center Doctor Paged [phone] [facility] RN, Sonia Side, Lab Low Alert Value Arnette Norris - MD 01/25/2016 12:17:54 PM Spoke with On Call - General Message Result Low Alert Value HGB 6.5, Ref Range 13.2-17.1, drawn 01/24/16 at 1700 reported to On Call general

## 2016-01-28 NOTE — Telephone Encounter (Signed)
Will order labs.. Have pt make appt  On 10/20 if not already done so.

## 2016-01-28 NOTE — Telephone Encounter (Signed)
Labs ordered.

## 2016-01-28 NOTE — Telephone Encounter (Signed)
-----   Message from Eustace Pen, LPN sent at [id number]  4:49 PM EDT ----- Regarding: Post-hospital lab orders Dr. Diona Browner,  During TCM outreach, pt's daughter asked if pt could have kidney labwork completed Friday, 01/31/16 so that you could discuss the results with them on the 24th? Please advise and place lab orders, if you agree to request.   Thank you, Katha Cabal

## 2016-01-28 NOTE — Telephone Encounter (Signed)
Lab appointment scheduled for 01/31/2016 at 9:10 am.  Will route back to Dr. Diona Browner to order future labs.

## 2016-01-31 ENCOUNTER — Other Ambulatory Visit (INDEPENDENT_AMBULATORY_CARE_PROVIDER_SITE_OTHER): Payer: Medicare Other

## 2016-01-31 DIAGNOSIS — D508 Other iron deficiency anemias: Secondary | ICD-10-CM | POA: Diagnosis not present

## 2016-01-31 DIAGNOSIS — N179 Acute kidney failure, unspecified: Secondary | ICD-10-CM

## 2016-01-31 LAB — COMPREHENSIVE METABOLIC PANEL
ALT: 11 U/L (ref 0–53)
AST: 14 U/L (ref 0–37)
Albumin: 4.2 g/dL (ref 3.5–5.2)
Alkaline Phosphatase: 108 U/L (ref 39–117)
BUN: 22 mg/dL (ref 6–23)
CALCIUM: 9.7 mg/dL (ref 8.4–10.5)
CHLORIDE: 106 meq/L (ref 96–112)
CO2: 28 meq/L (ref 19–32)
CREATININE: 1.06 mg/dL (ref 0.40–1.50)
GFR: 70.88 mL/min (ref >60.00)
Glucose, Bld: 129 mg/dL — ABNORMAL HIGH (ref 70–99)
Potassium: 4.5 mEq/L (ref 3.5–5.1)
Sodium: 140 mEq/L (ref 135–145)
Total Bilirubin: 0.6 mg/dL (ref 0.2–1.2)
Total Protein: 6.9 g/dL (ref 6.0–8.3)

## 2016-01-31 LAB — CBC WITH DIFFERENTIAL/PLATELET
BASOS PCT: 0.4 % (ref 0.0–3.0)
Basophils Absolute: 0 10*3/uL (ref 0.0–0.1)
EOS ABS: 0.3 10*3/uL (ref 0.0–0.7)
Eosinophils Relative: 2.7 % (ref 0.0–5.0)
HEMATOCRIT: 28.1 % — AB (ref 39.0–52.0)
Hemoglobin: 9.1 g/dL — ABNORMAL LOW (ref 13.0–17.0)
LYMPHS ABS: 2.7 10*3/uL (ref 0.7–4.0)
LYMPHS PCT: 25.3 % (ref 12.0–46.0)
MCHC: 32.2 g/dL (ref 30.0–36.0)
MCV: 80.3 fl (ref 78.0–100.0)
MONO ABS: 1 10*3/uL (ref 0.1–1.0)
Monocytes Relative: 9.9 % (ref 3.0–12.0)
NEUTROS ABS: 6.5 10*3/uL (ref 1.4–7.7)
NEUTROS PCT: 61.7 % (ref 43.0–77.0)
PLATELETS: 253 10*3/uL (ref 150.0–400.0)
RBC: 3.5 Mil/uL — ABNORMAL LOW (ref 4.22–5.81)
RDW: 22.9 % — AB (ref 11.5–15.5)
WBC: 10.5 10*3/uL (ref 4.0–10.5)

## 2016-02-02 ENCOUNTER — Other Ambulatory Visit: Payer: Self-pay | Admitting: Family Medicine

## 2016-02-04 ENCOUNTER — Ambulatory Visit (INDEPENDENT_AMBULATORY_CARE_PROVIDER_SITE_OTHER): Payer: Medicare Other | Admitting: Family Medicine

## 2016-02-04 ENCOUNTER — Encounter: Payer: Self-pay | Admitting: Family Medicine

## 2016-02-04 VITALS — BP 106/55 | HR 75 | Temp 98.2°F | Ht 67.0 in | Wt 175.0 lb

## 2016-02-04 DIAGNOSIS — N179 Acute kidney failure, unspecified: Secondary | ICD-10-CM | POA: Diagnosis not present

## 2016-02-04 DIAGNOSIS — D500 Iron deficiency anemia secondary to blood loss (chronic): Secondary | ICD-10-CM

## 2016-02-04 DIAGNOSIS — D508 Other iron deficiency anemias: Secondary | ICD-10-CM

## 2016-02-04 DIAGNOSIS — K5909 Other constipation: Secondary | ICD-10-CM

## 2016-02-04 MED ORDER — GABAPENTIN 300 MG PO CAPS: 300.0000 mg | ORAL_CAPSULE | Freq: Two times a day (BID) | ORAL | 3 refills | Status: DC

## 2016-02-04 MED ORDER — TIOTROPIUM BROMIDE MONOHYDRATE 18 MCG IN CAPS: ORAL_CAPSULE | RESPIRATORY_TRACT | 3 refills | Status: DC

## 2016-02-04 MED ORDER — TAMSULOSIN HCL 0.4 MG PO CAPS: 0.4000 mg | ORAL_CAPSULE | Freq: Every day | ORAL | 3 refills | Status: DC

## 2016-02-04 MED ORDER — FUROSEMIDE 40 MG PO TABS: 40.0000 mg | ORAL_TABLET | Freq: Every day | ORAL | 3 refills | Status: DC

## 2016-02-04 MED ORDER — POTASSIUM CHLORIDE CRYS ER 20 MEQ PO TBCR: 20.0000 meq | EXTENDED_RELEASE_TABLET | Freq: Two times a day (BID) | ORAL | 3 refills | Status: DC

## 2016-02-04 NOTE — Progress Notes (Signed)
Pre visit review using our clinic review tool, if applicable. No additional management support is needed unless otherwise documented below in the visit note. 

## 2016-02-04 NOTE — Progress Notes (Signed)
   Subjective:    Patient ID: [name], male    DOB: 08/13/1930, 82 y.o.   MRN: [id number]  HPI   [age] yea rold male recently seen with diziness presents for hospital follow up after found to be profoundly anemic with Hg 6.5.  Admitted 10/14 to 10/15.  Hospital course Blood loss/severe iron deficiency  anemia His hemoglobin in the past one year has been in the range of 11.Patient transfused 2 units PRBC. Stable on telemetry overnight. -Hemoglobin improved to 8.4. He remains asymptomatic. He has had multiple EGD, colonoscopy and capsule study which have been unrevealing for blood loss.  [facility] Dr. Carlean Purl felt since patient is asymptomatic with improvement in his improving and wishes to go home to take care of his wife was on home hospice, he agreed patient could be discharged home and could be seen in the office as needed. -I will discharge him on iron supplement. -He is instructed to follow-up with his PCP in 1 week and have his hemoglobin rechecked.   Acute kidney injury Possibly associated with anemia. Resolved with transfusion and IV fluids.  Low normal vitamin B12 (291)  consider adding supplement as outpatient.  Essential hypertension Stable. Resume home medications.  Headache eval'ed in hospital with head CT and  CXR: both clear. He feels better overall  Hg on follow up 4 days ago was 9.1.  Creatinine on recheck went from 1.46 to1.20 to 1.06.   No lightheadedness, no SOB, no chest pain. No headache. He has good energy. Weakness in legs better.   Having some constipation  On ferrous sulfate BID.  Review of Systems  Constitutional: Negative for fatigue.  HENT: Negative for ear pain.   Eyes: Negative for pain.  Respiratory: Negative for shortness of breath.   Cardiovascular: Negative for chest pain.       Objective:   Physical Exam  Constitutional: Vital signs are normal. He appears well-developed and well-nourished.  Elderly in NAD  HENT:  Head:  Normocephalic.  Right Ear: Hearing normal.  Left Ear: Hearing normal.  Nose: Nose normal.  Mouth/Throat: Oropharynx is clear and moist and mucous membranes are normal.  Neck: Trachea normal. Carotid bruit is not present. No thyroid mass and no thyromegaly present.  Cardiovascular: Normal rate, regular rhythm and normal pulses.  Exam reveals no gallop, no distant heart sounds and no friction rub.   No murmur heard. No peripheral edema  Pulmonary/Chest: Effort normal and breath sounds normal. No respiratory distress.  Musculoskeletal:  Arthritis deformity in bilateral hands  Neurological: He is alert. He is not disoriented. He displays atrophy. No cranial nerve deficit or sensory deficit. He exhibits abnormal muscle tone. Coordination and gait abnormal.     Skin: Skin is warm, dry and intact. No rash noted.  Psychiatric: He has a normal mood and affect. His speech is normal and behavior is normal. Thought content normal.          Assessment & Plan:

## 2016-02-04 NOTE — Patient Instructions (Addendum)
Continue iron twice daily as long as not worsening constiaption.  Can use miralax for constipation.   We will recheck Hg at physical in next month.

## 2016-02-07 NOTE — Assessment & Plan Note (Signed)
Likely due to anemia, now improved.

## 2016-02-07 NOTE — Assessment & Plan Note (Signed)
GI work up in past without clear source of previous GI bleed. Hg improving, continue iron as tolerated.

## 2016-02-07 NOTE — Assessment & Plan Note (Signed)
Can use miralax for constipation.

## 2016-02-19 ENCOUNTER — Other Ambulatory Visit: Payer: Self-pay | Admitting: Family Medicine

## 2016-02-28 ENCOUNTER — Telehealth: Payer: Self-pay

## 2016-02-28 NOTE — Telephone Encounter (Signed)
[name] requested refill for ferrous sulfate to [facility]; pt has available refills until pt is seen for labs 03/23/16 and CPX 03/27/16. [name] said she would continue ferrous sulfate at this time and talk with Dr Diona Browner at next appt. Nothing further needed.

## 2016-03-02 DIAGNOSIS — C61 Malignant neoplasm of prostate: Secondary | ICD-10-CM | POA: Diagnosis not present

## 2016-03-02 DIAGNOSIS — R350 Frequency of micturition: Secondary | ICD-10-CM | POA: Diagnosis not present

## 2016-03-02 DIAGNOSIS — N401 Enlarged prostate with lower urinary tract symptoms: Secondary | ICD-10-CM | POA: Diagnosis not present

## 2016-03-12 ENCOUNTER — Encounter: Payer: Self-pay | Admitting: Internal Medicine

## 2016-03-12 ENCOUNTER — Encounter (INDEPENDENT_AMBULATORY_CARE_PROVIDER_SITE_OTHER): Payer: Self-pay

## 2016-03-12 ENCOUNTER — Ambulatory Visit (INDEPENDENT_AMBULATORY_CARE_PROVIDER_SITE_OTHER): Payer: Medicare Other | Admitting: Internal Medicine

## 2016-03-12 VITALS — BP 126/80 | HR 88 | Ht 67.0 in | Wt 179.2 lb

## 2016-03-12 DIAGNOSIS — I5032 Chronic diastolic (congestive) heart failure: Secondary | ICD-10-CM | POA: Diagnosis not present

## 2016-03-12 DIAGNOSIS — Z95810 Presence of automatic (implantable) cardiac defibrillator: Secondary | ICD-10-CM

## 2016-03-12 DIAGNOSIS — I4891 Unspecified atrial fibrillation: Secondary | ICD-10-CM | POA: Diagnosis not present

## 2016-03-12 DIAGNOSIS — I428 Other cardiomyopathies: Secondary | ICD-10-CM | POA: Diagnosis not present

## 2016-03-12 NOTE — Progress Notes (Signed)
Patient Care Team: Jinny Sanders, MD as PCP - General (Family Medicine) Jolaine Artist, MD (Cardiology) Jolaine Artist, MD (Cardiology) Deboraha Sprang, MD (Cardiology)   HPI  [name] is a 82 y.o. male  Seen in followup for CRT-D implantation in the setting of nonischemic myopathy and atrial fibrillation. Previously he was on warfarin. This was discontinued because of GI bleeding and he has been treated with aspirin  He is status post AV junction ablation. He has had intercurrent normalization of LV systolic function.  November 2010 he had syncope with appropriate VF therapy For polymorphic ventricular tachycardia he has some lightheadedness with standing but no syncope His lisinopril was decreased 10>>5   His device reached ERI 10/14 and he underwent generator replacement as an ICD CRT at that time. The patient denies chest pain, shortness of breath, nocturnal dyspnea, orthopnea or peripheral edema.  There have been no palpitations, lightheadedness or syncope.     He and his daughter both think he is doing great         Past Medical History:  Diagnosis Date  . AICD (automatic cardioverter/defibrillator) present   . Anemia    iron deficiency anemia with previous severe GI bleed   . Arthritis    back and knees  . Atrial fibrillation ([place])    s/p AV node ablation; not on coumadin due to GIB  . 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 CAD (coronary artery disease)    Mild, nonobstructive (LHC 1/07: mLAD 20%, pCFX 20-30%, mRCA 30%, EF 25%)  . Cancer [facility])    prostate  . Cardiac arrest - ventricular fibrillation 02/2008   Aborted, shocked by ICD  . Cervical radiculopathy    left  . CHF (congestive heart failure) (HCC)    secondary to nonischemic cardiomyopathy; ECHO 10/07 EF 20-25%, mod to severe MR; ECHO 1/10 EF 55-60%, mild MR; ECHO 2/11 55-65%, grade 1 diast dysfxn, miod to mod LAE, mild TR;    S/P Medtronic BiV ICD with biventricular  function now turned off due to diahragmatic simulation  . Chronic kidney disease   . Chronic neck pain   . COPD (chronic obstructive pulmonary disease) (HCC)    GOLD II; Spirometry 07/10/2008 >FEV1 1.46 56% predicted, ratio of 66%  . Dementia   . Diabetes mellitus    type 2 NIDDM x 5-6 yrs  . Dyspnea    exertional  . GERD (gastroesophageal reflux disease)   . Hyperlipidemia   . Hypertension   . Inguinal hernia    left; asymptomatic  . Left bundle branch block   . Medical history non-contributory   . Obesity   . Pneumonia    01/2011  . Presence of permanent cardiac pacemaker   . Prostatic hypertrophy 09-11-97   Benign  . Torticollis     Past Surgical History:  Procedure Laterality Date  . ANTERIOR CERVICAL DISCECTOMY  02/2010  . BIV ICD GENERTAOR CHANGE OUT N/A 02/08/2013   Procedure: BIV ICD GENERTAOR CHANGE OUT;  Surgeon: Deboraha Sprang, MD;  Location: [facility] CATH LAB;  Service: Cardiovascular;  Laterality: N/A;  . CARDIAC CATHETERIZATION  01/2006   Showed mild nonobstructive CAD  . CARDIAC CATHETERIZATION  11/05/2006   Right atrial pressure mean of 12, RV pressure 36/8, PA pressure 39/16 witha mean of 28, wedge pressure was 20. Fick cardiac output was 5 liters per minute, cardiac index was 2.4  . COLONOSCOPY  06/26/2002   Multip (  neg) divertics, int hemm  . COLONOSCOPY  07/10/2004   Poyps, divertics, int hemms  . COLONOSCOPY  06/19/2008   2 polyps divertics int hemms (Dr Henrene Pastor)  . COLONOSCOPY W/ POLYPECTOMY  11/1997   Divertics, int hemms  . CORONARY ANGIOPLASTY     Min abstrut zd severe LB dystn EF 25%  . CT HEAD LIMITED W/O CM  10/03/2006   No acute abnmlty  . ESOPHAGOGASTRODUODENOSCOPY  09/1997   Prepylor ulcer, esoph ring, duod avm  . ESOPHAGOGASTRODUODENOSCOPY  05/22/2002   Poss Barrett's   . ESOPHAGOGASTRODUODENOSCOPY  02/19/2006   HH No active bleeding  . EYE SURGERY     cataract, bilateral  . HOSP  8/14-8/23/2008   Acute on chronis CHF IIIB [place]  Cardiomyop EF 20-25% Mod-Sev MR  . INSERT / REPLACE / REMOVE PACEMAKER  2007  . KNEE ARTHROSCOPY  1988  . KNEE SURGERY  1996   Left  . LOBECTOMY  01-21-98   lung  . LUMBAR LAMINECTOMY/DECOMPRESSION MICRODISCECTOMY  04/29/2011   Procedure: LUMBAR LAMINECTOMY/DECOMPRESSION MICRODISCECTOMY;  Surgeon: Eustace Moore, MD;  Location: [place] NEURO ORS;  Service: Neurosurgery;  Laterality: N/A;  Lumbar Two, Three, Four, Five Decompressive Lumbar Laminectomies  . MCH GI BLEED  02/07-02/12/2002  . M[facility] SOB  10/11-10/18/2007   A fib, CHF  . [facility] x  11/08-11/03/2006   Acute blood loss, anemia, sys HF, isch cardiomyopathy  . ROTATOR CUFF REPAIR  1994 and 1995    Current Outpatient Prescriptions  Medication Sig Dispense Refill  . acetaminophen (TYLENOL) 500 MG tablet Take 500 mg by mouth every 6 (six) hours as needed for mild pain.     Marland Kitchen allopurinol (ZYLOPRIM) 100 MG tablet TAKE 1 TABLET DAILY 90 tablet 3  . aspirin EC 81 MG tablet Take 81 mg by mouth 2 (two) times daily.    Marland Kitchen atorvastatin (LIPITOR) 20 MG tablet TAKE 1 TABLET DAILY AT     BEDTIME 90 tablet 0  . AVODART 0.5 MG capsule TAKE 1 CAPSULE DAILY 90 capsule 1  . carvedilol (COREG) 3.125 MG tablet TAKE 1 TABLET TWICE A DAY  WITH MEALS 180 tablet 3  . Cholecalciferol (TH VITAMIN D3) 2000 UNITS CAPS Take 2,000 Units by mouth daily.     Marland Kitchen dexlansoprazole (DEXILANT) 60 MG capsule Take 1 capsule (60 mg total) by mouth daily. 90 capsule 3  . donepezil (ARICEPT) 10 MG tablet Take 1 tablet (10 mg total) by mouth at bedtime. 90 tablet 3  . eplerenone (INSPRA) 25 MG tablet Take 0.5 tablets (12.5 mg total) by mouth daily. 45 tablet 3  . ferrous sulfate 325 (65 FE) MG tablet Take 1 tablet (325 mg total) by mouth 2 (two) times daily with a meal. 60 tablet 3  . furosemide (LASIX) 40 MG tablet Take 1 tablet (40 mg total) by mouth daily. 90 tablet 3  . gabapentin (NEURONTIN) 300 MG capsule Take 1 capsule (300 mg total) by mouth 2 (two) times daily. 180 capsule 3  .  guaiFENesin (MUCINEX) 600 MG 12 hr tablet Take 1,200 mg by mouth 2 (two) times daily as needed for cough.     Marland Kitchen lisinopril (PRINIVIL,ZESTRIL) 2.5 MG tablet Take 1 tablet (2.5 mg total) by mouth daily. 90 tablet 3  . potassium chloride SA (KLOR-CON M20) 20 MEQ tablet Take 1 tablet (20 mEq total) by mouth 2 (two) times daily. 180 tablet 3  . PROAIR HFA 108 (90 Base) MCG/ACT inhaler FOR DIRECTIONS ON HOW TO   TAKE THIS  MEDICINE, READ   THE ENCLOSED MEDICATION    INFORMATION FORM (Patient taking differently: INHALE 2 PUFFS EVERY MORNING, MAY ALSO INHALE 2 PUFFS AT BEDTIME AS NEEDED FOR SHORTNESS OF BREATH) 25.5 g 3  . tamsulosin (FLOMAX) 0.4 MG CAPS capsule Take 1 capsule (0.4 mg total) by mouth daily after supper. 90 capsule 3  . tiotropium (SPIRIVA HANDIHALER) 18 MCG inhalation capsule INHALE THE CONTENTS OF ONE CAPSULE DAILY IN THE       MORNING VIA HANDHIHALER    DEVICE 90 capsule 3   No current facility-administered medications for this visit.     No Known Allergies  Review of Systems negative except from HPI and PMH  Physical Exam BP 126/80   Pulse 88   Ht 5\' 7"  (1.702 m)   Wt 179 lb 3.2 oz (81.3 kg)   SpO2 98%   BMI 28.07 kg/m  Well developed and well nourished in no acute distress HENT normal E scleral and icterus clear Neck Supple JVP flat; carotids brisk and full Clear to ausculation Device pocket well healed; without hematoma or erythema.  There is no tethering Regular rate and rhythm, no murmurs gallops or rub Soft with active bowel sounds No clubbing cyanosis none Edema Alert and oriented, grossly normal motor and sensory function Skin Warm and Dry  ECG demonstrates atrial fibrillation and ventricular pacing with an upright QRS duration in lead V1  Assessment and  Plan  Atrial fibrillation-permanent  Cardiomyopathy-resolved  CRT-D-Medtronic  Complete heart block  Blood work was checked 11/16 and was normal   He is disinclined to consider anticoagulation; he  will continue on aspirin; he had GI bleeding on warfarin  Heart failure symptoms are stable

## 2016-03-12 NOTE — Patient Instructions (Signed)
Medication Instructions:  Your physician recommends that you continue on your current medications as directed. Please refer to the Current Medication list given to you today.   Labwork: none  Testing/Procedures: none  Follow-Up: Your physician wants you to follow-up in: 6 months in [place] will receive a reminder letter in the mail two months in advance. If you don't receive a letter, please call our office to schedule the follow-up appointment.  Your physician wants you to follow-up in: 12 months with Dr. Gari Crown will receive a reminder letter in the mail two months in advance. If you don't receive a letter, please call our office to schedule the follow-up appointment.  Remote monitoring is used to monitor your Pacemaker or ICD from home. This monitoring reduces the number of office visits required to check your device to one time per year. It allows [place] to keep an eye on the functioning of your device to ensure it is working properly. You are scheduled for a device check from home on 06/11/2016. You may send your transmission at any time that day. If you have a wireless device, the transmission will be sent automatically. After your physician reviews your transmission, you will receive a postcard with your next transmission date.    Any Other Special Instructions Will Be Listed Below (If Applicable).     If you need a refill on your cardiac medications before your next appointment, please call your pharmacy.

## 2016-03-13 LAB — CUP PACEART INCLINIC DEVICE CHECK
Battery Voltage: 2.94 V
Brady Statistic AP VP Percent: 0 %
Brady Statistic AS VP Percent: 96.78 %
Brady Statistic AS VS Percent: 3.22 %
HIGH POWER IMPEDANCE MEASURED VALUE: 37 Ohm
HIGH POWER IMPEDANCE MEASURED VALUE: 51 Ohm
Implantable Lead Implant Date: 20080513
Implantable Lead Location: 753860
Implantable Lead Model: 5076
Implantable Lead Model: 6947
Implantable Pulse Generator Implant Date: 20141029
Lead Channel Impedance Value: 4047 Ohm
Lead Channel Impedance Value: 513 Ohm
Lead Channel Impedance Value: 589 Ohm
Lead Channel Pacing Threshold Amplitude: 1.75 V
Lead Channel Pacing Threshold Pulse Width: 0.4 ms
Lead Channel Pacing Threshold Pulse Width: 0.8 ms
Lead Channel Sensing Intrinsic Amplitude: 8.625 mV
Lead Channel Setting Pacing Amplitude: 2.5 V
Lead Channel Setting Pacing Pulse Width: 0.4 ms
Lead Channel Setting Sensing Sensitivity: 0.3 mV
MDC IDC LEAD IMPLANT DT: 20080513
MDC IDC LEAD IMPLANT DT: 20080513
MDC IDC LEAD LOCATION: 753858
MDC IDC LEAD LOCATION: 753859
MDC IDC MSMT BATTERY REMAINING LONGEVITY: 25 mo
MDC IDC MSMT LEADCHNL LV IMPEDANCE VALUE: 4047 Ohm
MDC IDC MSMT LEADCHNL LV IMPEDANCE VALUE: 437 Ohm
MDC IDC MSMT LEADCHNL RA IMPEDANCE VALUE: 456 Ohm
MDC IDC MSMT LEADCHNL RA SENSING INTR AMPL: 0.5 mV
MDC IDC MSMT LEADCHNL RV PACING THRESHOLD AMPLITUDE: 0.75 V
MDC IDC SESS DTM: 20171130214538
MDC IDC SET LEADCHNL LV PACING AMPLITUDE: 2.5 V
MDC IDC SET LEADCHNL LV PACING PULSEWIDTH: 0.8 ms
MDC IDC STAT BRADY AP VS PERCENT: 0 %
MDC IDC STAT BRADY RA PERCENT PACED: 0 %
MDC IDC STAT BRADY RV PERCENT PACED: 97.54 %

## 2016-03-23 ENCOUNTER — Other Ambulatory Visit: Payer: Medicare Other

## 2016-03-23 ENCOUNTER — Other Ambulatory Visit (INDEPENDENT_AMBULATORY_CARE_PROVIDER_SITE_OTHER): Payer: Medicare Other

## 2016-03-23 ENCOUNTER — Telehealth: Payer: Self-pay | Admitting: Family Medicine

## 2016-03-23 DIAGNOSIS — D508 Other iron deficiency anemias: Secondary | ICD-10-CM

## 2016-03-23 DIAGNOSIS — E782 Mixed hyperlipidemia: Secondary | ICD-10-CM

## 2016-03-23 DIAGNOSIS — E1140 Type 2 diabetes mellitus with diabetic neuropathy, unspecified: Secondary | ICD-10-CM | POA: Diagnosis not present

## 2016-03-23 DIAGNOSIS — E559 Vitamin D deficiency, unspecified: Secondary | ICD-10-CM

## 2016-03-23 LAB — COMPREHENSIVE METABOLIC PANEL
ALBUMIN: 4.1 g/dL (ref 3.5–5.2)
ALK PHOS: 103 U/L (ref 39–117)
ALT: 10 U/L (ref 0–53)
AST: 13 U/L (ref 0–37)
BUN: 24 mg/dL — AB (ref 6–23)
CALCIUM: 9.5 mg/dL (ref 8.4–10.5)
CO2: 28 mEq/L (ref 19–32)
CREATININE: 1.2 mg/dL (ref 0.40–1.50)
Chloride: 105 mEq/L (ref 96–112)
GFR: 61.4 mL/min (ref >60.00)
Glucose, Bld: 125 mg/dL — ABNORMAL HIGH (ref 70–99)
POTASSIUM: 4.5 meq/L (ref 3.5–5.1)
SODIUM: 141 meq/L (ref 135–145)
TOTAL PROTEIN: 6.8 g/dL (ref 6.0–8.3)
Total Bilirubin: 0.4 mg/dL (ref 0.2–1.2)

## 2016-03-23 LAB — CBC WITH DIFFERENTIAL/PLATELET
BASOS ABS: 0 10*3/uL (ref 0.0–0.1)
Basophils Relative: 0.5 % (ref 0.0–3.0)
Eosinophils Absolute: 0.3 10*3/uL (ref 0.0–0.7)
Eosinophils Relative: 3.3 % (ref 0.0–5.0)
HEMATOCRIT: 31.8 % — AB (ref 39.0–52.0)
Hemoglobin: 10.5 g/dL — ABNORMAL LOW (ref 13.0–17.0)
LYMPHS PCT: 24.3 % (ref 12.0–46.0)
Lymphs Abs: 2 10*3/uL (ref 0.7–4.0)
MCHC: 33.2 g/dL (ref 30.0–36.0)
MCV: 86.4 fl (ref 78.0–100.0)
MONOS PCT: 10.5 % (ref 3.0–12.0)
Monocytes Absolute: 0.9 10*3/uL (ref 0.1–1.0)
Neutro Abs: 5.2 10*3/uL (ref 1.4–7.7)
Neutrophils Relative %: 61.4 % (ref 43.0–77.0)
Platelets: 206 10*3/uL (ref 150.0–400.0)
RBC: 3.68 Mil/uL — AB (ref 4.22–5.81)
RDW: 24.3 % — ABNORMAL HIGH (ref 11.5–15.5)
WBC: 8.4 10*3/uL (ref 4.0–10.5)

## 2016-03-23 LAB — LIPID PANEL
CHOLESTEROL: 110 mg/dL (ref 0–200)
HDL: 27 mg/dL — AB (ref >39.00)
LDL Cholesterol: 60 mg/dL (ref 0–99)
NONHDL: 83.38
Total CHOL/HDL Ratio: 4
Triglycerides: 117 mg/dL (ref 0.0–149.0)
VLDL: 23.4 mg/dL (ref 0.0–40.0)

## 2016-03-23 LAB — HEMOGLOBIN A1C: HEMOGLOBIN A1C: 5.9 % (ref 4.6–6.5)

## 2016-03-23 LAB — VITAMIN D 25 HYDROXY (VIT D DEFICIENCY, FRACTURES): VITD: 55.5 ng/mL (ref 30.00–100.00)

## 2016-03-23 NOTE — Telephone Encounter (Signed)
-----   Message from Marchia Bond sent at 03/16/2016  2:54 PM EST ----- Regarding: Cpx labs mon 12/11, need orders. Thanks :-) Please order  future cpx labs for pt's upcoming lab appt. Thanks [name]

## 2016-03-27 ENCOUNTER — Encounter: Payer: Self-pay | Admitting: Family Medicine

## 2016-03-27 ENCOUNTER — Ambulatory Visit (INDEPENDENT_AMBULATORY_CARE_PROVIDER_SITE_OTHER): Payer: Medicare Other | Admitting: Family Medicine

## 2016-03-27 VITALS — BP 136/79 | HR 78 | Temp 97.6°F | Ht 63.5 in | Wt 175.8 lb

## 2016-03-27 DIAGNOSIS — D500 Iron deficiency anemia secondary to blood loss (chronic): Secondary | ICD-10-CM | POA: Diagnosis not present

## 2016-03-27 DIAGNOSIS — E782 Mixed hyperlipidemia: Secondary | ICD-10-CM

## 2016-03-27 DIAGNOSIS — I5032 Chronic diastolic (congestive) heart failure: Secondary | ICD-10-CM

## 2016-03-27 DIAGNOSIS — E559 Vitamin D deficiency, unspecified: Secondary | ICD-10-CM | POA: Diagnosis not present

## 2016-03-27 DIAGNOSIS — J449 Chronic obstructive pulmonary disease, unspecified: Secondary | ICD-10-CM | POA: Diagnosis not present

## 2016-03-27 DIAGNOSIS — Z0000 Encounter for general adult medical examination without abnormal findings: Secondary | ICD-10-CM | POA: Diagnosis not present

## 2016-03-27 DIAGNOSIS — H6121 Impacted cerumen, right ear: Secondary | ICD-10-CM | POA: Diagnosis not present

## 2016-03-27 DIAGNOSIS — E1140 Type 2 diabetes mellitus with diabetic neuropathy, unspecified: Secondary | ICD-10-CM

## 2016-03-27 DIAGNOSIS — I4891 Unspecified atrial fibrillation: Secondary | ICD-10-CM

## 2016-03-27 DIAGNOSIS — I10 Essential (primary) hypertension: Secondary | ICD-10-CM

## 2016-03-27 DIAGNOSIS — M109 Gout, unspecified: Secondary | ICD-10-CM

## 2016-03-27 LAB — HM DIABETES FOOT EXAM

## 2016-03-27 MED ORDER — FERROUS SULFATE 325 (65 FE) MG PO TABS: 325.0000 mg | ORAL_TABLET | Freq: Two times a day (BID) | ORAL | 3 refills | Status: DC

## 2016-03-27 NOTE — Assessment & Plan Note (Signed)
Well controlled. Continue current medication.  

## 2016-03-27 NOTE — Assessment & Plan Note (Signed)
No flares on allopurinol. 

## 2016-03-27 NOTE — Progress Notes (Signed)
Pre visit review using our clinic review tool, if applicable. No additional management support is needed unless otherwise documented below in the visit note. 

## 2016-03-27 NOTE — Assessment & Plan Note (Signed)
Stable control with diet.  

## 2016-03-27 NOTE — Patient Instructions (Addendum)
Can decrease iron to once daily if causing constipation or hurting stomach.  Set up yearly eye exam.

## 2016-03-27 NOTE — Assessment & Plan Note (Signed)
Rate controlled on ASA anticoag.

## 2016-03-27 NOTE — Assessment & Plan Note (Signed)
Stable on supplement ?

## 2016-03-27 NOTE — Assessment & Plan Note (Signed)
Stable control on spiriva daily and albuterol BID. Recommended smoking cessation.. Pt refuses.

## 2016-03-27 NOTE — Assessment & Plan Note (Signed)
Euvolemic on lasix daily. Followed by Cards.

## 2016-03-27 NOTE — Assessment & Plan Note (Signed)
Improving hg.. Decrease iron to 1 time daily as causes SE.

## 2016-03-27 NOTE — Progress Notes (Addendum)
Subjective:    Patient ID: [name], male    DOB: 12-19-1930, 82 y.o.   MRN: [id number]  HPI  I have personally reviewed the Medicare Annual Wellness questionnaire and have noted 1. The patient's medical and social history 2. Their use of alcohol, tobacco or illicit drugs 3. Their current medications and supplements 4. The patient's functional ability including ADL's, fall risks, home safety risks and hearing or visual             impairment. 5. Diet and physical activities 6. Evidence for depression or mood disorders 7.         Updated provider list Cognitive evaluation was performed and recorded on pt medicare questionnaire form. The patients weight, height, BMI and visual acuity have been recorded in the chart  I have made referrals, counseling and provided education to the patient based review of the above and I have provided the pt with a written personalized care plan for preventive services.   Afib, polymorphic vent tach, CHF, afib: Followed by Cardiology. Euvolemic on lasix 40 mg daily.  Recent OV reviewed 02/2106.  Diabetes:   At goal on no med. Lab Results  Component Value Date   HGBA1C 5.9 03/23/2016  Feet problems:no ulcers Blood Sugars averaging: not checking eye exam within last year:  Elevated Cholesterol: High risk for CVD, LDL at goal < 70 On lipitor Using medications without problems:None Muscle aches: None Diet compliance: moderate Exercise: none Other complaints:  Vit D in nml range  Social History /Family History/Past Medical History reviewed and updated if needed.  Anemia, iron def, chronic stable and improving over time. No constipation with iron.   Gout, no flares in last year on allopurinol 100 mg daily.   COPD, stable on current meds. Spiriva daily, using albuterol twice daily prn.   Hypertension:    Good control on  BP Readings from Last 3 Encounters:  03/27/16 136/79  03/12/16 126/80  02/04/16 (!) 106/55  Using medication  without problems or lightheadedness:  occ Chest pain with exertion: none Edema:none Short of breath:stable Average home BPs: Other issues:  Using gabapentin to 2 a day for peripheral nueropthy Pt  Continues to use his hoverround daily  Around housefor mobility. This enables him to complete ADLs such as toileting, feeding and bathing.   Patient Care Team: Jinny Sanders, MD as PCP - General (Family Medicine) Jolaine Artist, MD (Cardiology) Jolaine Artist, MD (Cardiology) Deboraha Sprang, MD (Cardiology) Dermatology: Dr. Denna Haggard Review of Systems     Objective:   Physical Exam  Constitutional: Vital signs are normal. He appears well-developed and well-nourished.  Elderly in NAD  HENT:  Head: Normocephalic.  Right Ear: Hearing normal.  Left Ear: Hearing normal.  Nose: Nose normal.  Mouth/Throat: Oropharynx is clear and moist and mucous membranes are normal.  Right cerumen impaction.Margy Clarks without complication.  Neck: Trachea normal. Carotid bruit is not present. No thyroid mass and no thyromegaly present.  Cardiovascular: Normal rate, regular rhythm and normal pulses.  Exam reveals no gallop, no distant heart sounds and no friction rub.   No murmur heard. No peripheral edema  Pulmonary/Chest: Effort normal and breath sounds normal. No respiratory distress.  Musculoskeletal:  Arthritis deformity in bilateral hands  Neurological: He is alert. He is not disoriented. He displays atrophy. No cranial nerve deficit or sensory deficit. He exhibits abnormal muscle tone. Coordination and gait abnormal.     Skin: Skin is warm, dry and intact. No rash  noted.  Psychiatric: He has a normal mood and affect. His speech is normal and behavior is normal. Thought content normal.          Assessment & Plan:  The patient's preventative maintenance and recommended screening tests for an annual wellness exam were reviewed in full today. Brought up to date unless services  declined.  Counselled on the importance of diet, exercise, and its role in overall health and mortality. The patient's FH and SH was reviewed, including their home life, tobacco status, and drug and alcohol status.    Vaccines: uptodate Prostate Cancer Screen: followed by uro.Marland Kitchen Hx of prostate cancer Colon Cancer Screen: not indicated      Smoking Status: current > 60 year..  Not candidate for lung cancer  Screening program

## 2016-04-02 ENCOUNTER — Other Ambulatory Visit: Payer: Self-pay

## 2016-04-02 MED ORDER — LISINOPRIL 2.5 MG PO TABS
2.5000 mg | ORAL_TABLET | Freq: Every day | ORAL | 3 refills | Status: DC
Start: 2016-04-02 — End: 2017-05-11

## 2016-04-02 NOTE — Telephone Encounter (Signed)
Pt left note requesting lisinopril to [facility]; last annual 03/27/16. Refilled per protocol. [name] notified and voiced understanding.

## 2016-04-06 ENCOUNTER — Other Ambulatory Visit: Payer: Self-pay | Admitting: Family Medicine

## 2016-04-07 NOTE — Telephone Encounter (Signed)
Ok to refill? Electronically refill request for   dexlansoprazole (DEXILANT) 60 MG capsule #90 with 3 refills  Last prescribed on 01/24/2015. Last seen on 03/27/2016

## 2016-04-10 ENCOUNTER — Telehealth: Payer: Self-pay | Admitting: *Deleted

## 2016-04-10 MED ORDER — FERROUS SULFATE 325 (65 FE) MG PO TABS: 325.0000 mg | ORAL_TABLET | Freq: Two times a day (BID) | ORAL | 3 refills | Status: DC

## 2016-04-10 NOTE — Telephone Encounter (Signed)
PTs daughter came in because the mail order could not fill the Ferrous Sulf Tab 325MG  (R). They would like a new rx called to [facility]. Please let them know when this is done.

## 2016-04-10 NOTE — Telephone Encounter (Signed)
Rx sent electronically to [facility]. Message left on [name] Tickle's cell that rx was sent in.

## 2016-04-10 NOTE — Telephone Encounter (Signed)
Okay to call in rx as requested.

## 2016-04-15 ENCOUNTER — Other Ambulatory Visit: Payer: Self-pay | Admitting: Family Medicine

## 2016-04-29 ENCOUNTER — Other Ambulatory Visit: Payer: Self-pay | Admitting: Family Medicine

## 2016-04-30 NOTE — Telephone Encounter (Signed)
Last office visit 03/27/16.  Not on current medication list.  Refill?

## 2016-05-22 ENCOUNTER — Other Ambulatory Visit: Payer: Self-pay | Admitting: Family Medicine

## 2016-06-11 ENCOUNTER — Ambulatory Visit (INDEPENDENT_AMBULATORY_CARE_PROVIDER_SITE_OTHER): Payer: Medicare Other | Admitting: *Deleted

## 2016-06-11 DIAGNOSIS — I428 Other cardiomyopathies: Secondary | ICD-10-CM | POA: Diagnosis not present

## 2016-06-11 NOTE — Progress Notes (Signed)
Remote ICD transmission.   

## 2016-06-16 ENCOUNTER — Encounter: Payer: Self-pay | Admitting: Cardiology

## 2016-06-16 LAB — CUP PACEART REMOTE DEVICE CHECK
Battery Remaining Longevity: 27 mo
Brady Statistic AS VS Percent: 0 %
Brady Statistic RA Percent Paced: 0 %
Brady Statistic RV Percent Paced: 98.21 %
Date Time Interrogation Session: 20180301062505
HighPow Impedance: 41 Ohm
HighPow Impedance: 50 Ohm
Implantable Lead Implant Date: 20080513
Implantable Lead Location: 753859
Implantable Lead Model: 6947
Implantable Pulse Generator Implant Date: 20141029
Lead Channel Impedance Value: 456 Ohm
Lead Channel Impedance Value: 494 Ohm
Lead Channel Pacing Threshold Amplitude: 0.5 V
Lead Channel Pacing Threshold Amplitude: 2 V
Lead Channel Pacing Threshold Pulse Width: 0.8 ms
Lead Channel Setting Pacing Amplitude: 2.5 V
Lead Channel Setting Pacing Amplitude: 2.5 V
Lead Channel Setting Pacing Pulse Width: 0.4 ms
Lead Channel Setting Pacing Pulse Width: 0.8 ms
Lead Channel Setting Sensing Sensitivity: 0.3 mV
MDC IDC LEAD IMPLANT DT: 20080513
MDC IDC LEAD IMPLANT DT: 20080513
MDC IDC LEAD LOCATION: 753858
MDC IDC LEAD LOCATION: 753860
MDC IDC MSMT BATTERY VOLTAGE: 2.95 V
MDC IDC MSMT LEADCHNL LV IMPEDANCE VALUE: 4047 Ohm
MDC IDC MSMT LEADCHNL LV IMPEDANCE VALUE: 4047 Ohm
MDC IDC MSMT LEADCHNL LV IMPEDANCE VALUE: 513 Ohm
MDC IDC MSMT LEADCHNL RA SENSING INTR AMPL: 0.375 mV
MDC IDC MSMT LEADCHNL RA SENSING INTR AMPL: 0.5 mV
MDC IDC MSMT LEADCHNL RV IMPEDANCE VALUE: 513 Ohm
MDC IDC MSMT LEADCHNL RV PACING THRESHOLD PULSEWIDTH: 0.4 ms
MDC IDC MSMT LEADCHNL RV SENSING INTR AMPL: 9.5 mV
MDC IDC MSMT LEADCHNL RV SENSING INTR AMPL: 9.5 mV
MDC IDC STAT BRADY AP VP PERCENT: 0 %
MDC IDC STAT BRADY AP VS PERCENT: 0 %
MDC IDC STAT BRADY AS VP PERCENT: 100 %

## 2016-06-17 ENCOUNTER — Other Ambulatory Visit: Payer: Self-pay | Admitting: *Deleted

## 2016-06-17 NOTE — Telephone Encounter (Signed)
Received fax today from [facility] requesting refills for Eplerenone 25 mg.  Medication is prescribed by Dr. Haroldine Laws.  Will forward request to him to refill?

## 2016-06-19 NOTE — Telephone Encounter (Signed)
No needs to be filled by cardiology.

## 2016-06-19 NOTE — Telephone Encounter (Signed)
Medication prescribed by Dr. Haroldine Laws.  Sent to him on 06/17/2016 and had not be refilled.  Is this a medication you are okay refilling for [name]?

## 2016-07-24 ENCOUNTER — Other Ambulatory Visit (HOSPITAL_COMMUNITY): Payer: Self-pay | Admitting: Internal Medicine

## 2016-08-17 ENCOUNTER — Ambulatory Visit (INDEPENDENT_AMBULATORY_CARE_PROVIDER_SITE_OTHER): Payer: Medicare Other | Admitting: Family Medicine

## 2016-08-17 ENCOUNTER — Ambulatory Visit (INDEPENDENT_AMBULATORY_CARE_PROVIDER_SITE_OTHER)
Admission: RE | Admit: 2016-08-17 | Discharge: 2016-08-17 | Disposition: A | Discharge status: Home / Self Care | Payer: Medicare Other | Source: Ambulatory Visit | Attending: Family Medicine | Admitting: Family Medicine

## 2016-08-17 ENCOUNTER — Encounter: Payer: Self-pay | Admitting: Family Medicine

## 2016-08-17 VITALS — BP 108/70 | HR 67 | Temp 98.1°F | Resp 24 | Wt 182.0 lb

## 2016-08-17 DIAGNOSIS — J449 Chronic obstructive pulmonary disease, unspecified: Secondary | ICD-10-CM | POA: Diagnosis not present

## 2016-08-17 DIAGNOSIS — R042 Hemoptysis: Secondary | ICD-10-CM

## 2016-08-17 DIAGNOSIS — R918 Other nonspecific abnormal finding of lung field: Secondary | ICD-10-CM | POA: Diagnosis not present

## 2016-08-17 LAB — CBC WITH DIFFERENTIAL/PLATELET
BASOS ABS: 0 10*3/uL (ref 0.0–0.1)
Basophils Relative: 0.2 % (ref 0.0–3.0)
EOS ABS: 0.1 10*3/uL (ref 0.0–0.7)
Eosinophils Relative: 0.7 % (ref 0.0–5.0)
HEMATOCRIT: 29.1 % — AB (ref 39.0–52.0)
HEMOGLOBIN: 9.5 g/dL — AB (ref 13.0–17.0)
LYMPHS PCT: 14.7 % (ref 12.0–46.0)
Lymphs Abs: 2.1 10*3/uL (ref 0.7–4.0)
MCHC: 32.5 g/dL (ref 30.0–36.0)
MCV: 93.7 fl (ref 78.0–100.0)
Monocytes Absolute: 1 10*3/uL (ref 0.1–1.0)
Monocytes Relative: 7.3 % (ref 3.0–12.0)
Neutro Abs: 10.8 10*3/uL — ABNORMAL HIGH (ref 1.4–7.7)
Neutrophils Relative %: 77.1 % — ABNORMAL HIGH (ref 43.0–77.0)
Platelets: 173 10*3/uL (ref 150.0–400.0)
RBC: 3.11 Mil/uL — AB (ref 4.22–5.81)
RDW: 15.8 % — ABNORMAL HIGH (ref 11.5–15.5)
WBC: 14 10*3/uL — AB (ref 4.0–10.5)

## 2016-08-17 MED ORDER — DOXYCYCLINE HYCLATE 100 MG PO TABS: 100.0000 mg | ORAL_TABLET | Freq: Two times a day (BID) | ORAL | 0 refills | Status: DC

## 2016-08-17 MED ORDER — PREDNISONE 20 MG PO TABS: 20.0000 mg | ORAL_TABLET | Freq: Every day | ORAL | 0 refills | Status: DC

## 2016-08-17 NOTE — Assessment & Plan Note (Signed)
With acute exacerbation. Given increased sputum production, wheezing, will place on course of prednisone and doxcycline.

## 2016-08-17 NOTE — Assessment & Plan Note (Signed)
New in a smoker with COPD. Warrants further evaluation- CXR, CBC today.

## 2016-08-17 NOTE — Progress Notes (Signed)
Pre visit review using our clinic review tool, if applicable. No additional management support is needed unless otherwise documented below in the visit note. 

## 2016-08-17 NOTE — Patient Instructions (Signed)
Great to see you.  Please take doxcycyline and prednisone as directed.  Continue using your inhalers.  I will call you with your xray and lab results.

## 2016-08-17 NOTE — Progress Notes (Signed)
Subjective:   Patient ID: [name], male    DOB: 1930-09-09, 83 y.o.   MRN: [id number]  [name] is a pleasant 83 y.o. year old male pt of Dr. Diona Browner, new to me, who presents to clinic today with his daughter for Cough (Has not been feeling well over the weekend. COughing and wheezing. Says he is coughing up light red blood this morning. Very weak.)  on 08/17/2016  HPI:  Has a h/o COPD- per pt's daughter, he has not been taking spirva as directed.  He does use his albuterol twice daily even when he is not having an acute exacerbation.  Over the weekend, he began to develop a progressively wetter cough and increased wheezing.  He is a smoker and has a morning productive cough on several days but not usually this much sputum.  He cough up blood tinged sputum several times yesterday and again this morning.  No fevers, feels very fatigued and weak.  Has a h/o anemia.  Lab Results  Component Value Date   WBC 8.4 03/23/2016   HGB 10.5 (L) 03/23/2016   HCT 31.8 (L) 03/23/2016   MCV 86.4 03/23/2016   PLT 206.0 03/23/2016   Current Outpatient Prescriptions on File Prior to Visit  Medication Sig Dispense Refill  . acetaminophen (TYLENOL) 500 MG tablet Take 500 mg by mouth every 6 (six) hours as needed for mild pain.     Marland Kitchen allopurinol (ZYLOPRIM) 100 MG tablet TAKE 1 TABLET DAILY 90 tablet 3  . aspirin EC 81 MG tablet Take 81 mg by mouth 2 (two) times daily.    Marland Kitchen atorvastatin (LIPITOR) 20 MG tablet TAKE 1 TABLET DAILY AT     BEDTIME 90 tablet 3  . AVODART 0.5 MG capsule TAKE 1 CAPSULE DAILY 90 capsule 1  . carvedilol (COREG) 3.125 MG tablet TAKE 1 TABLET TWICE A DAY  WITH MEALS 180 tablet 3  . Cholecalciferol (TH VITAMIN D3) 2000 UNITS CAPS Take 2,000 Units by mouth daily.     Marland Kitchen DEXILANT 60 MG capsule TAKE 1 CAPSULE DAILY 90 capsule 3  . donepezil (ARICEPT) 10 MG tablet Take 1 tablet (10 mg total) by mouth at bedtime. 90 tablet 3  . eplerenone (INSPRA) 25 MG tablet Take  0.5 tablets (12.5 mg total) by mouth daily. 45 tablet 3  . furosemide (LASIX) 40 MG tablet Take 1 tablet (40 mg total) by mouth daily. 90 tablet 3  . gabapentin (NEURONTIN) 300 MG capsule Take 1 capsule (300 mg total) by mouth 2 (two) times daily. 180 capsule 3  . guaiFENesin (MUCINEX) 600 MG 12 hr tablet Take 1,200 mg by mouth 2 (two) times daily as needed for cough.     Marland Kitchen lisinopril (PRINIVIL,ZESTRIL) 2.5 MG tablet Take 1 tablet (2.5 mg total) by mouth daily. 90 tablet 3  . potassium chloride SA (KLOR-CON M20) 20 MEQ tablet Take 1 tablet (20 mEq total) by mouth 2 (two) times daily. 180 tablet 3  . PROAIR HFA 108 (90 Base) MCG/ACT inhaler FOR DIRECTIONS ON HOW TO   TAKE THIS MEDICINE, READ   THE ENCLOSED MEDICATION    INFORMATION FORM 25.5 g 3  . tamsulosin (FLOMAX) 0.4 MG CAPS capsule Take 1 capsule (0.4 mg total) by mouth daily after supper. 90 capsule 3  . tiotropium (SPIRIVA HANDIHALER) 18 MCG inhalation capsule INHALE THE CONTENTS OF ONE CAPSULE DAILY IN THE       MORNING VIA HANDHIHALER    DEVICE 90 capsule 3  No current facility-administered medications on file prior to visit.     No Known Allergies  Past Medical History:  Diagnosis Date  . AICD (automatic cardioverter/defibrillator) present   . Anemia    iron deficiency anemia with previous severe GI bleed   . Arthritis    back and knees  . Atrial fibrillation ([place])    s/p AV node ablation; not on coumadin due to GIB  . 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 CAD (coronary artery disease)    Mild, nonobstructive (LHC 1/07: mLAD 20%, pCFX 20-30%, mRCA 30%, EF 25%)  . Cancer [facility])    prostate  . Cardiac arrest - ventricular fibrillation 02/2008   Aborted, shocked by ICD  . Cervical radiculopathy    left  . CHF (congestive heart failure) (HCC)    secondary to nonischemic cardiomyopathy; ECHO 10/07 EF 20-25%, mod to severe MR; ECHO 1/10 EF 55-60%, mild MR; ECHO 2/11 55-65%, grade 1 diast dysfxn, miod to mod  LAE, mild TR;    S/P Medtronic BiV ICD with biventricular function now turned off due to diahragmatic simulation  . Chronic kidney disease   . Chronic neck pain   . COPD (chronic obstructive pulmonary disease) (HCC)    GOLD II; Spirometry 07/10/2008 >FEV1 1.46 56% predicted, ratio of 66%  . Dementia   . Diabetes mellitus    type 2 NIDDM x 5-6 yrs  . Dyspnea    exertional  . GERD (gastroesophageal reflux disease)   . Hyperlipidemia   . Hypertension   . Inguinal hernia    left; asymptomatic  . Left bundle branch block   . Medical history non-contributory   . Obesity   . Pneumonia    01/2011  . Presence of permanent cardiac pacemaker   . Prostatic hypertrophy 09-11-97   Benign  . Torticollis     Past Surgical History:  Procedure Laterality Date  . ANTERIOR CERVICAL DISCECTOMY  02/2010  . BIV ICD GENERTAOR CHANGE OUT N/A 02/08/2013   Procedure: BIV ICD GENERTAOR CHANGE OUT;  Surgeon: Deboraha Sprang, MD;  Location: [facility] CATH LAB;  Service: Cardiovascular;  Laterality: N/A;  . CARDIAC CATHETERIZATION  01/2006   Showed mild nonobstructive CAD  . CARDIAC CATHETERIZATION  11/05/2006   Right atrial pressure mean of 12, RV pressure 36/8, PA pressure 39/16 witha mean of 28, wedge pressure was 20. Fick cardiac output was 5 liters per minute, cardiac index was 2.4  . COLONOSCOPY  06/26/2002   Multip (neg) divertics, int hemm  . COLONOSCOPY  07/10/2004   Poyps, divertics, int hemms  . COLONOSCOPY  06/19/2008   2 polyps divertics int hemms (Dr Henrene Pastor)  . COLONOSCOPY W/ POLYPECTOMY  11/1997   Divertics, int hemms  . CORONARY ANGIOPLASTY     Min abstrut zd severe LB dystn EF 25%  . CT HEAD LIMITED W/O CM  10/03/2006   No acute abnmlty  . ESOPHAGOGASTRODUODENOSCOPY  09/1997   Prepylor ulcer, esoph ring, duod avm  . ESOPHAGOGASTRODUODENOSCOPY  05/22/2002   Poss Barrett's   . ESOPHAGOGASTRODUODENOSCOPY  02/19/2006   HH No active bleeding  . EYE SURGERY     cataract, bilateral  . HOSP   8/14-8/23/2008   Acute on chronis CHF IIIB NOnisch Cardiomyop EF 20-25% Mod-Sev MR  . INSERT / REPLACE / REMOVE PACEMAKER  2007  . KNEE ARTHROSCOPY  1988  . KNEE SURGERY  1996   Left  . LOBECTOMY  01-21-98   lung  .  LUMBAR LAMINECTOMY/DECOMPRESSION MICRODISCECTOMY  04/29/2011   Procedure: LUMBAR LAMINECTOMY/DECOMPRESSION MICRODISCECTOMY;  Surgeon: Eustace Moore, MD;  Location: [facility];  Service: Neurosurgery;  Laterality: N/A;  Lumbar Two, Three, Four, Five Decompressive Lumbar Laminectomies  . M[facility] GI BLEED  02/07-02/12/2002  . [facility] SOB  10/11-10/18/2007   A fib, CHF  . [facility] x  11/08-11/03/2006   Acute blood loss, anemia, sys HF, isch cardiomyopathy  . ROTATOR CUFF REPAIR  1994 and 1995    Family History  Problem Relation Age of Onset  . Heart failure Mother     CHF  . Heart failure Father     CHF  . Hypertension Sister   . Obesity Sister   . Cancer Brother     lung cancer  . Lung cancer Brother   . Liver disease Brother     ETOH  . Alcohol abuse Brother   . Cancer Brother   . COPD Brother   . Cancer Brother   . Anxiety disorder Brother   . COPD Brother   . Hypertension Sister     Social History   Social History  . Marital status: Married    Spouse name: N/A  . Number of children: N/A  . Years of education: N/A   Occupational History  . [employer]    Retired  .  Retired   Social History Main Topics  . Smoking status: Current Every Day Smoker    Packs/day: 1.00    Years: 60.00    Types: Cigarettes  . Smokeless tobacco: Never Used     Comment: Smoker since 41, quit for 2 years and started back in 03/2008  . Alcohol use 0.0 oz/week  . Drug use: No  . Sexual activity: Not Currently   Other Topics Concern  . Not on file   Social History Narrative   Has living will: Full Code.   HCPOA: wife and daughter      Married, lives with wife and 1 adopted child   The PMH, PSH, Social History, Family History, Medications, and allergies have been  reviewed in [facility], and have been updated if relevant.   Review of Systems  Constitutional: Positive for fatigue. Negative for fever.  HENT: Positive for congestion, postnasal drip and rhinorrhea.   Eyes: Negative.   Respiratory: Positive for cough, shortness of breath and wheezing.   Cardiovascular: Negative.   Gastrointestinal: Negative.   Genitourinary: Negative.   Musculoskeletal: Negative.   Allergic/Immunologic: Negative.   Neurological: Negative.   Hematological: Negative.   Psychiatric/Behavioral: Negative.   All other systems reviewed and are negative.      Objective:    BP 108/70 (BP Location: Left Arm, Patient Position: Sitting, Cuff Size: Normal)   Pulse 67   Temp 98.1 F (36.7 C) (Oral)   Resp (!) 24   Wt 182 lb (82.6 kg)   SpO2 95%   BMI 31.73 kg/m   Wt Readings from Last 3 Encounters:  08/17/16 182 lb (82.6 kg)  03/27/16 175 lb 12 oz (79.7 kg)  03/12/16 179 lb 3.2 oz (81.3 kg)    Physical Exam  Constitutional: He appears well-developed and well-nourished. No distress.  Does appear a bit pale but daughter feels this is basline  HENT:  Head: Normocephalic and atraumatic.  Eyes: Conjunctivae are normal.  Cardiovascular: Normal rate and regular rhythm.   Pulmonary/Chest: He has wheezes.  Musculoskeletal: He exhibits edema.  Skin: Skin is warm and dry. He is not diaphoretic.  Psychiatric: He has  a normal mood and affect. His behavior is normal. Judgment and thought content normal.  Nursing note and vitals reviewed.         Assessment & Plan:   COPD, moderate ([place])  Hemoptysis - Plan: DG Chest 2 View, CBC with Differential/Platelet No Follow-up on file.

## 2016-08-21 NOTE — Telephone Encounter (Signed)
Can we refill please?

## 2016-09-08 ENCOUNTER — Other Ambulatory Visit (HOSPITAL_COMMUNITY): Payer: Self-pay | Admitting: Internal Medicine

## 2016-09-10 ENCOUNTER — Ambulatory Visit (INDEPENDENT_AMBULATORY_CARE_PROVIDER_SITE_OTHER): Payer: Medicare Other | Admitting: *Deleted

## 2016-09-10 DIAGNOSIS — I428 Other cardiomyopathies: Secondary | ICD-10-CM | POA: Diagnosis not present

## 2016-09-11 NOTE — Progress Notes (Signed)
Remote ICD transmission.   

## 2016-09-16 LAB — CUP PACEART REMOTE DEVICE CHECK
Battery Remaining Longevity: 24 mo
Battery Voltage: 2.94 V
Brady Statistic AP VS Percent: 0 %
Brady Statistic RA Percent Paced: 0 %
HIGH POWER IMPEDANCE MEASURED VALUE: 54 Ohm
HighPow Impedance: 42 Ohm
Implantable Lead Implant Date: 20080513
Implantable Lead Location: 753859
Implantable Lead Location: 753860
Implantable Lead Model: 6947
Implantable Pulse Generator Implant Date: 20141029
Lead Channel Impedance Value: 4047 Ohm
Lead Channel Impedance Value: 456 Ohm
Lead Channel Impedance Value: 532 Ohm
Lead Channel Impedance Value: 627 Ohm
Lead Channel Pacing Threshold Amplitude: 0.625 V
Lead Channel Pacing Threshold Pulse Width: 0.4 ms
Lead Channel Sensing Intrinsic Amplitude: 9.125 mV
Lead Channel Setting Pacing Amplitude: 2.5 V
Lead Channel Setting Pacing Amplitude: 2.5 V
Lead Channel Setting Pacing Pulse Width: 0.8 ms
MDC IDC LEAD IMPLANT DT: 20080513
MDC IDC LEAD IMPLANT DT: 20080513
MDC IDC LEAD LOCATION: 753858
MDC IDC MSMT LEADCHNL LV IMPEDANCE VALUE: 4047 Ohm
MDC IDC MSMT LEADCHNL LV PACING THRESHOLD AMPLITUDE: 2 V
MDC IDC MSMT LEADCHNL LV PACING THRESHOLD PULSEWIDTH: 0.8 ms
MDC IDC MSMT LEADCHNL RA SENSING INTR AMPL: 0.375 mV
MDC IDC MSMT LEADCHNL RA SENSING INTR AMPL: 0.5 mV
MDC IDC MSMT LEADCHNL RV IMPEDANCE VALUE: 494 Ohm
MDC IDC MSMT LEADCHNL RV SENSING INTR AMPL: 9.125 mV
MDC IDC SESS DTM: 20180531170538
MDC IDC SET LEADCHNL RV PACING PULSEWIDTH: 0.4 ms
MDC IDC SET LEADCHNL RV SENSING SENSITIVITY: 0.3 mV
MDC IDC STAT BRADY AP VP PERCENT: 0 %
MDC IDC STAT BRADY AS VP PERCENT: 95.86 %
MDC IDC STAT BRADY AS VS PERCENT: 4.14 %
MDC IDC STAT BRADY RV PERCENT PACED: 96.84 %

## 2016-09-18 ENCOUNTER — Encounter: Payer: Self-pay | Admitting: Cardiology

## 2016-11-12 ENCOUNTER — Ambulatory Visit (INDEPENDENT_AMBULATORY_CARE_PROVIDER_SITE_OTHER): Payer: Medicare Other | Admitting: Family Medicine

## 2016-11-12 ENCOUNTER — Encounter: Payer: Self-pay | Admitting: Family Medicine

## 2016-11-12 VITALS — BP 112/64 | HR 70 | Temp 98.2°F | Ht 63.5 in | Wt 180.8 lb

## 2016-11-12 DIAGNOSIS — J209 Acute bronchitis, unspecified: Secondary | ICD-10-CM | POA: Diagnosis not present

## 2016-11-12 DIAGNOSIS — W19XXXA Unspecified fall, initial encounter: Secondary | ICD-10-CM

## 2016-11-12 DIAGNOSIS — J441 Chronic obstructive pulmonary disease with (acute) exacerbation: Secondary | ICD-10-CM

## 2016-11-12 DIAGNOSIS — J440 Chronic obstructive pulmonary disease with acute lower respiratory infection: Secondary | ICD-10-CM | POA: Diagnosis not present

## 2016-11-12 MED ORDER — PREDNISONE 20 MG PO TABS: ORAL_TABLET | ORAL | 0 refills | Status: DC

## 2016-11-12 MED ORDER — DOXYCYCLINE HYCLATE 100 MG PO TABS: 100.0000 mg | ORAL_TABLET | Freq: Two times a day (BID) | ORAL | 0 refills | Status: AC

## 2016-11-12 NOTE — Progress Notes (Signed)
Dr. Frederico Hamman T. Kemani Demarais, MD, [facility] [place], [address] Phone: [phone] Fax: [phone]  11/12/2016  Patient: [name], MRN: [id number], DOB: 1931/01/12, 83 y.o.  Primary Physician:  Jinny Sanders, MD   Chief Complaint  Patient presents with  . Cough    x2 weeks, phlegm-green, runny nose, Mucinex does not seem to alleviate  . Fall    knee and shoulder pain  . [place]   Subjective:   [name] is a 83 y.o. very pleasant male patient who presents with the following:  Patient has been coughing for 2 weeks, and he has some baseline COPD and is a long-term smoker.  He has a productive cough of yellow sputum and is been somewhat short of breath.  Over-the-counter remedies such as guaifenesin is not really helped at all.  He also fell earlier in the week and had some pain in his left knee and shoulder, but is completely resolved at this time.  Past Medical History, Surgical History, Social History, Family History, Problem List, Medications, and Allergies have been reviewed and updated if relevant.  Patient Active Problem List   Diagnosis Date Noted  . Hemoptysis 08/17/2016  . Blood loss anemia 01/25/2016  . Decreased mobility 03/14/2015  . Counseling regarding end of life decision making 02/28/2015  . Liver lesion 09/27/2014  . Transaminitis 08/30/2014  . Type 2 diabetes mellitus with diabetic neuropathy ([place])   . Chronic neck pain 08/28/2014  . Allergic rhinitis 06/22/2014  . Spinal stenosis of lumbar region 01/19/2014  . Moderate dementia without behavioral disturbance 12/22/2013  . Essential hypertension, benign 02/28/2013  . Acute gouty arthritis 02/28/2013  . Herpes zoster 01/20/2013  . B12 deficiency 06/03/2012  . Polymorphic ventricular tachycardia ([place]) 05/25/2012  . Biventricular implantable cardioverter-defibrillator Medtronic 05/18/2011  . Chronic diastolic heart failure  ([place]) 11/19/2010  . Atrial fibrillation ([place]) 11/19/2010  . Leg pain, bilateral 11/05/2010  . Chronic constipation 03/28/2010  . PERIPHERAL NEUROPATHY 02/14/2010  . Vitamin D deficiency 01/22/2010  . TOBACCO ABUSE 11/07/2008  . ADENOCARCINOMA, PROSTATE 10/10/2008  . Ventricular fibrillation ([place]) 07/03/2008  . CAROTIDYNIA 10/21/2006  . Hyperlipidemia 06/30/2006  . Anemia, iron deficiency 06/30/2006  . COPD, moderate ([place]) 06/30/2006  . BENIGN PROSTATIC HYPERTROPHY 06/30/2006  . PNEUMONIA, HX OF 06/30/2006  . ABSCESS, PERIRECTAL, HX OF 06/30/2006  . Pulmonary nodules 09/11/1997    Past Medical History:  Diagnosis Date  . AICD (automatic cardioverter/defibrillator) present   . Anemia    iron deficiency anemia with previous severe GI bleed   . Arthritis    back and knees  . Atrial fibrillation ([place])    s/p AV node ablation; not on coumadin due to GIB  . Blood clot in spinal cord artery ([place]) 2011   s/p ACDF  . CAD (coronary artery disease)    Mild, nonobstructive (LHC 1/07: mLAD 20%, pCFX 20-30%, mRCA 30%, EF 25%)  . Cancer [facility])    prostate  . Cardiac arrest - ventricular fibrillation 02/2008   Aborted, shocked by ICD  . Cervical radiculopathy    left  . CHF (congestive heart failure) (HCC)    secondary to nonischemic cardiomyopathy; ECHO 10/07 EF 20-25%, mod to severe MR; ECHO 1/10 EF 55-60%, mild MR; ECHO 2/11 55-65%, grade 1 diast dysfxn, miod to mod LAE, mild TR;    S/P Medtronic BiV ICD with biventricular function now turned off due to diahragmatic simulation  . Chronic  kidney disease   . Chronic neck pain   . COPD (chronic obstructive pulmonary disease) (HCC)    GOLD II; Spirometry 07/10/2008 >FEV1 1.46 56% predicted, ratio of 66%  . Dementia   . Diabetes mellitus    type 2 NIDDM x 5-6 yrs  . Dyspnea    exertional  . GERD (gastroesophageal reflux disease)   . Hyperlipidemia   . Hypertension   . Inguinal hernia    left; asymptomatic  . Left bundle branch block    . Medical history non-contributory   . Obesity   . Pneumonia    01/2011  . Presence of permanent cardiac pacemaker   . Prostatic hypertrophy 09-11-97   Benign  . Torticollis     Past Surgical History:  Procedure Laterality Date  . ANTERIOR CERVICAL DISCECTOMY  02/2010  . BIV ICD GENERTAOR CHANGE OUT N/A 02/08/2013   Procedure: BIV ICD GENERTAOR CHANGE OUT;  Surgeon: Deboraha Sprang, MD;  Location: [facility] CATH LAB;  Service: Cardiovascular;  Laterality: N/A;  . CARDIAC CATHETERIZATION  01/2006   Showed mild nonobstructive CAD  . CARDIAC CATHETERIZATION  11/05/2006   Right atrial pressure mean of 12, RV pressure 36/8, PA pressure 39/16 witha mean of 28, wedge pressure was 20. Fick cardiac output was 5 liters per minute, cardiac index was 2.4  . COLONOSCOPY  06/26/2002   Multip (neg) divertics, int hemm  . COLONOSCOPY  07/10/2004   Poyps, divertics, int hemms  . COLONOSCOPY  06/19/2008   2 polyps divertics int hemms (Dr Henrene Pastor)  . COLONOSCOPY W/ POLYPECTOMY  11/1997   Divertics, int hemms  . CORONARY ANGIOPLASTY     Min abstrut zd severe LB dystn EF 25%  . CT HEAD LIMITED W/O CM  10/03/2006   No acute abnmlty  . ESOPHAGOGASTRODUODENOSCOPY  09/1997   Prepylor ulcer, esoph ring, duod avm  . ESOPHAGOGASTRODUODENOSCOPY  05/22/2002   Poss Barrett's   . ESOPHAGOGASTRODUODENOSCOPY  02/19/2006   HH No active bleeding  . EYE SURGERY     cataract, bilateral  . HOSP  8/14-8/23/2008   Acute on chronis CHF IIIB [place] Cardiomyop EF 20-25% Mod-Sev MR  . INSERT / REPLACE / REMOVE PACEMAKER  2007  . KNEE ARTHROSCOPY  1988  . KNEE SURGERY  1996   Left  . LOBECTOMY  01-21-98   lung  . LUMBAR LAMINECTOMY/DECOMPRESSION MICRODISCECTOMY  04/29/2011   Procedure: LUMBAR LAMINECTOMY/DECOMPRESSION MICRODISCECTOMY;  Surgeon: Eustace Moore, MD;  Location: [facility];  Service: Neurosurgery;  Laterality: N/A;  Lumbar Two, Three, Four, Five Decompressive Lumbar Laminectomies  . MCH GI BLEED   02/07-02/12/2002  . MCH SOB  10/11-10/18/2007   A fib, CHF  . MCH x  11/08-11/03/2006   Acute blood loss, anemia, sys HF, isch cardiomyopathy  . ROTATOR CUFF REPAIR  1994 and 1995    Social History   Social History  . Marital status: Married    Spouse name: N/A  . Number of children: N/A  . Years of education: N/A   Occupational History  . [employer]    Retired  .  Retired   Social History Main Topics  . Smoking status: Current Every Day Smoker    Packs/day: 1.00    Years: 60.00    Types: Cigarettes  . Smokeless tobacco: Never Used     Comment: Smoker since 77, quit for 2 years and started back in 03/2008  . Alcohol use 0.0 oz/week  . Drug use: No  . Sexual activity:  Not Currently   Other Topics Concern  . Not on file   Social History Narrative   Has living will: Full Code.   HCPOA: wife and daughter      Married, lives with wife and 1 adopted child    Family History  Problem Relation Age of Onset  . Heart failure Mother        CHF  . Heart failure Father        CHF  . Hypertension Sister   . Obesity Sister   . Cancer Brother        lung cancer  . Lung cancer Brother   . Liver disease Brother        ETOH  . Alcohol abuse Brother   . Cancer Brother   . COPD Brother   . Cancer Brother   . Anxiety disorder Brother   . COPD Brother   . Hypertension Sister     No Known Allergies  Medication list reviewed and updated in full in [facility].  GEN: No fevers, chills. Nontoxic. Primarily MSK c/o today. MSK: Detailed in the HPI GI: tolerating PO intake without difficulty Neuro: No numbness, parasthesias, or tingling associated. Otherwise the pertinent positives of the ROS are noted above.   Objective:   BP 112/64 (BP Location: Left Arm, Patient Position: Sitting, Cuff Size: Large)   Pulse 70   Temp 98.2 F (36.8 C) (Oral)   Ht 5' 3.5" (1.613 m)   Wt 180 lb 12.8 oz (82 kg)   SpO2 99%   BMI 31.52 kg/m    GEN: A and O x 3.  WDWN. NAD.    ENT: Nose clear, ext NML.  No LAD.  No JVD.  TM's clear. Oropharynx clear.  PULM: Normal WOB, no distress. No crackles, diffuse rhonchi and intermittent wheezing bilaterally CV: RRR, no M/G/R, No rubs, No JVD.   EXT: warm and well-perfused, No tr LE edema PSYCH: Pleasant and conversant.   Nontender throughout all bony anatomy of the shoulder including the clavicle, scapula, and humerus.  Nontender around the knee including distal femur, proximal tibia and proximal fibula.  Patella is nontender.  Grossly unremarkable  Radiology: No results found.  Assessment and Plan:   COPD exacerbation ([place])  Acute bronchitis with COPD ([place])  Fall, initial encounter  Primary issue here is his COPD exacerbation, and we will treat with antibiotics and steroids.  No major injury  Follow-up: No Follow-up on file.  Future Appointments Date Time Provider [place]  12/10/2016 9:10 AM [facility] DEVICE REMOTES [facility]    Meds ordered this encounter  Medications  . doxycycline (VIBRA-TABS) 100 MG tablet    Sig: Take 1 tablet (100 mg total) by mouth 2 (two) times daily.    Dispense:  20 tablet    Refill:  0  . predniSONE (DELTASONE) 20 MG tablet    Sig: 2 tabs po for 5 days, then 1 tab po for 5 days    Dispense:  15 tablet    Refill:  0   Medications Discontinued During This Encounter  Medication Reason  . predniSONE (DELTASONE) 20 MG tablet Completed Course  . eplerenone (INSPRA) 25 MG tablet Duplicate  . doxycycline (VIBRA-TABS) 100 MG tablet    No orders of the defined types were placed in this encounter.   Signed,  Maud Deed. Imya Mance, MD   Allergies as of 11/12/2016   No Known Allergies     Medication List  Accurate as of 11/12/16 11:59 PM. Always use your most recent med list.          allopurinol 100 MG tablet Commonly known as:  ZYLOPRIM TAKE 1 TABLET DAILY   aspirin EC 81 MG tablet Take 81 mg by mouth 2 (two) times  daily.   atorvastatin 20 MG tablet Commonly known as:  LIPITOR TAKE 1 TABLET DAILY AT     BEDTIME   AVODART 0.5 MG capsule Generic drug:  dutasteride TAKE 1 CAPSULE DAILY   carvedilol 3.125 MG tablet Commonly known as:  COREG TAKE 1 TABLET TWICE A DAY  WITH MEALS   DEXILANT 60 MG capsule Generic drug:  dexlansoprazole TAKE 1 CAPSULE DAILY   donepezil 10 MG tablet Commonly known as:  ARICEPT Take 1 tablet (10 mg total) by mouth at bedtime.   doxycycline 100 MG tablet Commonly known as:  VIBRA-TABS Take 1 tablet (100 mg total) by mouth 2 (two) times daily.   eplerenone 25 MG tablet Commonly known as:  INSPRA Take 0.5 tablets (12.5 mg total) by mouth daily.   furosemide 40 MG tablet Commonly known as:  LASIX Take 1 tablet (40 mg total) by mouth daily.   gabapentin 300 MG capsule Commonly known as:  NEURONTIN Take 1 capsule (300 mg total) by mouth 2 (two) times daily.   guaiFENesin 600 MG 12 hr tablet Commonly known as:  MUCINEX Take 1,200 mg by mouth 2 (two) times daily as needed for cough.   lisinopril 2.5 MG tablet Commonly known as:  PRINIVIL,ZESTRIL Take 1 tablet (2.5 mg total) by mouth daily.   potassium chloride SA 20 MEQ tablet Commonly known as:  KLOR-CON M20 Take 1 tablet (20 mEq total) by mouth 2 (two) times daily.   predniSONE 20 MG tablet Commonly known as:  DELTASONE 2 tabs po for 5 days, then 1 tab po for 5 days   PROAIR HFA 108 (90 Base) MCG/ACT inhaler Generic drug:  albuterol FOR DIRECTIONS ON HOW TO   TAKE THIS MEDICINE, READ   THE ENCLOSED MEDICATION    INFORMATION FORM   tamsulosin 0.4 MG Caps capsule Commonly known as:  FLOMAX Take 1 capsule (0.4 mg total) by mouth daily after supper.   TH VITAMIN D3 2000 units Caps Generic drug:  Cholecalciferol Take 2,000 Units by mouth daily.   tiotropium 18 MCG inhalation capsule Commonly known as:  SPIRIVA HANDIHALER INHALE THE CONTENTS OF ONE CAPSULE DAILY IN THE       MORNING VIA HANDHIHALER     DEVICE   TYLENOL 500 MG tablet Generic drug:  acetaminophen Take 500 mg by mouth every 6 (six) hours as needed for mild pain.

## 2016-11-12 NOTE — Progress Notes (Signed)
1 

## 2016-12-10 ENCOUNTER — Ambulatory Visit (INDEPENDENT_AMBULATORY_CARE_PROVIDER_SITE_OTHER): Payer: Medicare Other | Admitting: *Deleted

## 2016-12-10 DIAGNOSIS — I428 Other cardiomyopathies: Secondary | ICD-10-CM | POA: Diagnosis not present

## 2016-12-10 NOTE — Progress Notes (Signed)
Remote ICD transmission.   

## 2016-12-22 ENCOUNTER — Encounter: Payer: Self-pay | Admitting: Cardiology

## 2016-12-29 LAB — CUP PACEART REMOTE DEVICE CHECK
Battery Remaining Longevity: 23 mo
Battery Voltage: 2.94 V
Brady Statistic RA Percent Paced: 0 %
Brady Statistic RV Percent Paced: 98.23 %
HighPow Impedance: 41 Ohm
HighPow Impedance: 55 Ohm
Implantable Lead Implant Date: 20080513
Implantable Lead Location: 753858
Implantable Lead Location: 753859
Implantable Lead Location: 753860
Implantable Lead Model: 5076
Implantable Lead Model: 6947
Implantable Pulse Generator Implant Date: 20141029
Lead Channel Impedance Value: 4047 Ohm
Lead Channel Impedance Value: 456 Ohm
Lead Channel Impedance Value: 532 Ohm
Lead Channel Pacing Threshold Amplitude: 0.75 V
Lead Channel Setting Pacing Amplitude: 2.5 V
Lead Channel Setting Pacing Pulse Width: 0.8 ms
MDC IDC LEAD IMPLANT DT: 20080513
MDC IDC LEAD IMPLANT DT: 20080513
MDC IDC MSMT LEADCHNL LV IMPEDANCE VALUE: 4047 Ohm
MDC IDC MSMT LEADCHNL LV IMPEDANCE VALUE: 532 Ohm
MDC IDC MSMT LEADCHNL LV PACING THRESHOLD AMPLITUDE: 2.75 V
MDC IDC MSMT LEADCHNL LV PACING THRESHOLD PULSEWIDTH: 0.8 ms
MDC IDC MSMT LEADCHNL RA SENSING INTR AMPL: 0.375 mV
MDC IDC MSMT LEADCHNL RA SENSING INTR AMPL: 0.5 mV
MDC IDC MSMT LEADCHNL RV IMPEDANCE VALUE: 456 Ohm
MDC IDC MSMT LEADCHNL RV PACING THRESHOLD PULSEWIDTH: 0.4 ms
MDC IDC MSMT LEADCHNL RV SENSING INTR AMPL: 9.5 mV
MDC IDC MSMT LEADCHNL RV SENSING INTR AMPL: 9.5 mV
MDC IDC SESS DTM: 20180830052205
MDC IDC SET LEADCHNL RV PACING AMPLITUDE: 2.5 V
MDC IDC SET LEADCHNL RV PACING PULSEWIDTH: 0.4 ms
MDC IDC SET LEADCHNL RV SENSING SENSITIVITY: 0.3 mV
MDC IDC STAT BRADY AP VP PERCENT: 0 %
MDC IDC STAT BRADY AP VS PERCENT: 0 %
MDC IDC STAT BRADY AS VP PERCENT: 100 %
MDC IDC STAT BRADY AS VS PERCENT: 0 %

## 2017-01-08 ENCOUNTER — Other Ambulatory Visit: Payer: Self-pay | Admitting: Family Medicine

## 2017-01-08 NOTE — Telephone Encounter (Signed)
Last office visit 11/12/2016 with Dr Lorelei Pont.  [place] scheduled for 03/30/2017.  Last refilled 02/04/2016 for #180 with 3 refills.  Ok to refill?

## 2017-01-27 ENCOUNTER — Other Ambulatory Visit: Payer: Self-pay | Admitting: Family Medicine

## 2017-01-29 ENCOUNTER — Ambulatory Visit (HOSPITAL_COMMUNITY)
Admission: RE | Admit: 2017-01-29 | Discharge: 2017-01-29 | Disposition: A | Discharge status: Home / Self Care | Payer: Medicare Other | Source: Ambulatory Visit | Attending: Internal Medicine | Admitting: Internal Medicine

## 2017-01-29 VITALS — BP 118/65 | HR 74 | Wt 176.0 lb

## 2017-01-29 DIAGNOSIS — I5022 Chronic systolic (congestive) heart failure: Secondary | ICD-10-CM | POA: Insufficient documentation

## 2017-01-29 DIAGNOSIS — Z79899 Other long term (current) drug therapy: Secondary | ICD-10-CM | POA: Diagnosis not present

## 2017-01-29 DIAGNOSIS — J449 Chronic obstructive pulmonary disease, unspecified: Secondary | ICD-10-CM | POA: Insufficient documentation

## 2017-01-29 DIAGNOSIS — F17200 Nicotine dependence, unspecified, uncomplicated: Secondary | ICD-10-CM

## 2017-01-29 DIAGNOSIS — M109 Gout, unspecified: Secondary | ICD-10-CM | POA: Diagnosis not present

## 2017-01-29 DIAGNOSIS — E1122 Type 2 diabetes mellitus with diabetic chronic kidney disease: Secondary | ICD-10-CM | POA: Diagnosis not present

## 2017-01-29 DIAGNOSIS — Z8674 Personal history of sudden cardiac arrest: Secondary | ICD-10-CM | POA: Insufficient documentation

## 2017-01-29 DIAGNOSIS — Z8249 Family history of ischemic heart disease and other diseases of the circulatory system: Secondary | ICD-10-CM | POA: Diagnosis not present

## 2017-01-29 DIAGNOSIS — E785 Hyperlipidemia, unspecified: Secondary | ICD-10-CM | POA: Insufficient documentation

## 2017-01-29 DIAGNOSIS — I482 Chronic atrial fibrillation: Secondary | ICD-10-CM | POA: Diagnosis not present

## 2017-01-29 DIAGNOSIS — Z95810 Presence of automatic (implantable) cardiac defibrillator: Secondary | ICD-10-CM | POA: Diagnosis not present

## 2017-01-29 DIAGNOSIS — M1990 Unspecified osteoarthritis, unspecified site: Secondary | ICD-10-CM | POA: Insufficient documentation

## 2017-01-29 DIAGNOSIS — N400 Enlarged prostate without lower urinary tract symptoms: Secondary | ICD-10-CM | POA: Insufficient documentation

## 2017-01-29 DIAGNOSIS — I428 Other cardiomyopathies: Secondary | ICD-10-CM | POA: Diagnosis not present

## 2017-01-29 DIAGNOSIS — K219 Gastro-esophageal reflux disease without esophagitis: Secondary | ICD-10-CM | POA: Diagnosis not present

## 2017-01-29 DIAGNOSIS — Z9889 Other specified postprocedural states: Secondary | ICD-10-CM | POA: Diagnosis not present

## 2017-01-29 DIAGNOSIS — I429 Cardiomyopathy, unspecified: Secondary | ICD-10-CM | POA: Insufficient documentation

## 2017-01-29 DIAGNOSIS — Z7982 Long term (current) use of aspirin: Secondary | ICD-10-CM | POA: Insufficient documentation

## 2017-01-29 DIAGNOSIS — F17210 Nicotine dependence, cigarettes, uncomplicated: Secondary | ICD-10-CM | POA: Insufficient documentation

## 2017-01-29 DIAGNOSIS — I447 Left bundle-branch block, unspecified: Secondary | ICD-10-CM | POA: Insufficient documentation

## 2017-01-29 DIAGNOSIS — I2510 Atherosclerotic heart disease of native coronary artery without angina pectoris: Secondary | ICD-10-CM | POA: Diagnosis not present

## 2017-01-29 DIAGNOSIS — N189 Chronic kidney disease, unspecified: Secondary | ICD-10-CM | POA: Diagnosis not present

## 2017-01-29 DIAGNOSIS — I130 Hypertensive heart and chronic kidney disease with heart failure and stage 1 through stage 4 chronic kidney disease, or unspecified chronic kidney disease: Secondary | ICD-10-CM | POA: Insufficient documentation

## 2017-01-29 DIAGNOSIS — I480 Paroxysmal atrial fibrillation: Secondary | ICD-10-CM | POA: Diagnosis not present

## 2017-01-29 DIAGNOSIS — E669 Obesity, unspecified: Secondary | ICD-10-CM | POA: Insufficient documentation

## 2017-01-29 DIAGNOSIS — D509 Iron deficiency anemia, unspecified: Secondary | ICD-10-CM | POA: Diagnosis not present

## 2017-01-29 NOTE — Progress Notes (Signed)
ADVANCED HF CLINIC NOTE  Patient ID: [name], male   DOB: 10/05/1930, 83 y.o.   MRN: [id number] EP: Dr [name]  PCP: Dr [name]  HPI: [name] is an 83  year-old male with a history of a chronic systolic heart failure, NICM,  recovered ejection fraction he is also has h/o chronic atrial fib status post AV node ablation and  BIV ICD implantation (not on coumadin due to h/o GIB). He also has a history of COPD, diabetes, hypertension and hyperlipidemia.   In November 2010, he had syncopal episode and ICD showed VF with appropriate therapy.   Underwent ICD change out in Jan 2011 for ERI without problem. Had echo Feb 2011 EF 55-65%.  In November 2014 he was admitted to [facility] and treated for gout.   Repeat echo 2/15 EF 55-60%  He returns for yearly follow up. Had neck surgery in 2007 and developed partial paralysis. Now using electric  WC. Says that he feels pretty good. Feels that he is getting weaker over the years but thinks it is normal.  No CP. Denies PND/Orthopnea. Uses electric WC due to neuropathy. But still able to ride tractor and do yardwork. Weight at home stable. Smokes regularly - says it makes him feel better than any medication he takes. Wife struggling with advanced dementia.   Labs 02/28/13 K 3.8 Creatinine 0.97 Labs 3/16  K 4.5 Creatinine 0.98  SH: Smoke 1.5 PPD. Does not drink alcohol. Lives with wife   FH: Father and Mother had heart failure . Sister HTN Obesity   ROS: All systems negative except as listed in HPI, PMH and Problem List.  Past Medical History:  Diagnosis Date  . AICD (automatic cardioverter/defibrillator) present   . Anemia    iron deficiency anemia with previous severe GI bleed   . Arthritis    back and knees  . Atrial fibrillation ([place])    s/p AV node ablation; not on coumadin due to GIB  . Blood clot in spinal cord artery ([place]) 2011   s/p ACDF  . CAD (coronary artery disease)    Mild, nonobstructive (LHC 1/07: mLAD 20%, pCFX 20-30%,  mRCA 30%, EF 25%)  . Cancer [facility])    prostate  . Cardiac arrest - ventricular fibrillation 02/2008   Aborted, shocked by ICD  . Cervical radiculopathy    left  . CHF (congestive heart failure) (HCC)    secondary to nonischemic cardiomyopathy; ECHO 10/07 EF 20-25%, mod to severe MR; ECHO 1/10 EF 55-60%, mild MR; ECHO 2/11 55-65%, grade 1 diast dysfxn, miod to mod LAE, mild TR;    S/P Medtronic BiV ICD with biventricular function now turned off due to diahragmatic simulation  . Chronic kidney disease   . Chronic neck pain   . COPD (chronic obstructive pulmonary disease) (HCC)    GOLD II; Spirometry 07/10/2008 >FEV1 1.46 56% predicted, ratio of 66%  . Dementia   . Diabetes mellitus    type 2 NIDDM x 5-6 yrs  . Dyspnea    exertional  . GERD (gastroesophageal reflux disease)   . Hyperlipidemia   . Hypertension   . Inguinal hernia    left; asymptomatic  . Left bundle branch block   . Medical history non-contributory   . Obesity   . Pneumonia    01/2011  . Presence of permanent cardiac pacemaker   . Prostatic hypertrophy 09-11-97   Benign  . Torticollis     Current Outpatient Prescriptions  Medication Sig Dispense Refill  .  acetaminophen (TYLENOL) 500 MG tablet Take 500 mg by mouth every 6 (six) hours as needed for mild pain.     Marland Kitchen aspirin EC 81 MG tablet Take 81 mg by mouth 2 (two) times daily.    Marland Kitchen atorvastatin (LIPITOR) 20 MG tablet TAKE 1 TABLET DAILY AT     BEDTIME 90 tablet 3  . AVODART 0.5 MG capsule TAKE 1 CAPSULE DAILY 90 capsule 1  . carvedilol (COREG) 3.125 MG tablet TAKE 1 TABLET TWICE A DAY  WITH MEALS 180 tablet 3  . Cholecalciferol (TH VITAMIN D3) 2000 UNITS CAPS Take 2,000 Units by mouth daily.     Marland Kitchen DEXILANT 60 MG capsule TAKE 1 CAPSULE DAILY 90 capsule 3  . donepezil (ARICEPT) 10 MG tablet TAKE 1 TABLET AT BEDTIME 90 tablet 3  . eplerenone (INSPRA) 25 MG tablet Take 0.5 tablets (12.5 mg total) by mouth daily. 45 tablet 3  . furosemide (LASIX) 40 MG tablet Take 1  tablet (40 mg total) by mouth daily. 90 tablet 3  . gabapentin (NEURONTIN) 300 MG capsule TAKE 1 CAPSULE TWICE DAILY 180 capsule 3  . guaiFENesin (MUCINEX) 600 MG 12 hr tablet Take 1,200 mg by mouth 2 (two) times daily as needed for cough.     Marland Kitchen KLOR-CON M20 20 MEQ tablet TAKE 1 TABLET TWICE A DAY 180 tablet 3  . lisinopril (PRINIVIL,ZESTRIL) 2.5 MG tablet Take 1 tablet (2.5 mg total) by mouth daily. 90 tablet 3  . PROAIR HFA 108 (90 Base) MCG/ACT inhaler FOR DIRECTIONS ON HOW TO   TAKE THIS MEDICINE, READ   THE ENCLOSED MEDICATION    INFORMATION FORM 25.5 g 3  . SPIRIVA HANDIHALER 18 MCG inhalation capsule INHALE THE CONTENTS OF ONE CAPSULE DAILY IN THE       MORNING VIA HANDIHALER     DEVICE 90 capsule 0  . tamsulosin (FLOMAX) 0.4 MG CAPS capsule Take 1 capsule (0.4 mg total) by mouth daily after supper. 90 capsule 3  . allopurinol (ZYLOPRIM) 100 MG tablet TAKE 1 TABLET DAILY 90 tablet 3   No current facility-administered medications for this encounter.      PHYSICAL EXAM: Vitals:   01/29/17 1404  BP: 118/65  Pulse: 74  SpO2: 97%   General:  Sitting in WC No resp difficulty HEENT: normal Neck: supple. no JVD. Carotids 2+ bilat; no bruits. No lymphadenopathy or thryomegaly appreciated. Cor: PMI nondisplaced. Regular rate & rhythm. No rubs, gallops or murmurs. Lungs: clear with decreased BS Abdomen: soft, nontender, nondistended. No hepatosplenomegaly. No bruits or masses. Good bowel sounds. Extremities: no cyanosis, clubbing, rash, trace edema R>L  Neuro: alert & orientedx3, cranial nerves grossly intact. LEs are weak. Affect pleasant   ASSESSMENT & PLAN: 1. Cardiomyopathy - NICM EF recovered - Functional capacity limited by paralysis. No dyspnea with current activities. No edema.  - EF normalized. Continue current regimen  2. A  Fib - S/p AVN ablation and Biv pacing. Not on coumadin due to GIB.  - Continue ASA 81 daily  3. Tobacco Abuse - continue to smoke 1ppd. declines  smoking cessation.   Follow up 1 year .    Glori Bickers MD 2:17 PM

## 2017-01-29 NOTE — Patient Instructions (Signed)
Take extra Furosemide (Lasix) tomorrow  We will contact you in 1 year to schedule your next appointment.

## 2017-02-19 ENCOUNTER — Other Ambulatory Visit: Payer: Self-pay | Admitting: Family Medicine

## 2017-02-22 ENCOUNTER — Other Ambulatory Visit: Payer: Self-pay | Admitting: Internal Medicine

## 2017-03-11 ENCOUNTER — Ambulatory Visit (INDEPENDENT_AMBULATORY_CARE_PROVIDER_SITE_OTHER): Payer: Medicare Other | Admitting: *Deleted

## 2017-03-11 DIAGNOSIS — I428 Other cardiomyopathies: Secondary | ICD-10-CM

## 2017-03-11 NOTE — Progress Notes (Signed)
Remote ICD transmission.   

## 2017-03-12 ENCOUNTER — Encounter: Payer: Self-pay | Admitting: Cardiology

## 2017-03-19 DIAGNOSIS — R3911 Hesitancy of micturition: Secondary | ICD-10-CM | POA: Diagnosis not present

## 2017-03-19 DIAGNOSIS — C61 Malignant neoplasm of prostate: Secondary | ICD-10-CM | POA: Diagnosis not present

## 2017-03-19 DIAGNOSIS — N401 Enlarged prostate with lower urinary tract symptoms: Secondary | ICD-10-CM | POA: Diagnosis not present

## 2017-03-22 LAB — CUP PACEART REMOTE DEVICE CHECK
Battery Remaining Longevity: 24 mo
Battery Voltage: 2.92 V
Brady Statistic AS VS Percent: 0 %
Brady Statistic RV Percent Paced: 97.95 %
HIGH POWER IMPEDANCE MEASURED VALUE: 42 Ohm
HighPow Impedance: 52 Ohm
Implantable Lead Implant Date: 20080513
Implantable Lead Location: 753858
Implantable Lead Location: 753860
Implantable Lead Model: 5076
Implantable Pulse Generator Implant Date: 20141029
Lead Channel Impedance Value: 437 Ohm
Lead Channel Impedance Value: 456 Ohm
Lead Channel Pacing Threshold Amplitude: 0.625 V
Lead Channel Pacing Threshold Amplitude: 2 V
Lead Channel Pacing Threshold Pulse Width: 0.4 ms
Lead Channel Pacing Threshold Pulse Width: 0.8 ms
Lead Channel Setting Sensing Sensitivity: 0.3 mV
MDC IDC LEAD IMPLANT DT: 20080513
MDC IDC LEAD IMPLANT DT: 20080513
MDC IDC LEAD LOCATION: 753859
MDC IDC MSMT LEADCHNL LV IMPEDANCE VALUE: 4047 Ohm
MDC IDC MSMT LEADCHNL LV IMPEDANCE VALUE: 4047 Ohm
MDC IDC MSMT LEADCHNL LV IMPEDANCE VALUE: 513 Ohm
MDC IDC MSMT LEADCHNL RA SENSING INTR AMPL: 0.375 mV
MDC IDC MSMT LEADCHNL RA SENSING INTR AMPL: 0.5 mV
MDC IDC MSMT LEADCHNL RV IMPEDANCE VALUE: 532 Ohm
MDC IDC MSMT LEADCHNL RV SENSING INTR AMPL: 9.5 mV
MDC IDC MSMT LEADCHNL RV SENSING INTR AMPL: 9.5 mV
MDC IDC SESS DTM: 20181128172200
MDC IDC SET LEADCHNL LV PACING AMPLITUDE: 2.5 V
MDC IDC SET LEADCHNL LV PACING PULSEWIDTH: 0.8 ms
MDC IDC SET LEADCHNL RV PACING AMPLITUDE: 2.5 V
MDC IDC SET LEADCHNL RV PACING PULSEWIDTH: 0.4 ms
MDC IDC STAT BRADY AP VP PERCENT: 0 %
MDC IDC STAT BRADY AP VS PERCENT: 0 %
MDC IDC STAT BRADY AS VP PERCENT: 0 %
MDC IDC STAT BRADY RA PERCENT PACED: 0 %

## 2017-03-30 ENCOUNTER — Ambulatory Visit (INDEPENDENT_AMBULATORY_CARE_PROVIDER_SITE_OTHER): Payer: Medicare Other

## 2017-03-30 ENCOUNTER — Encounter: Payer: Self-pay | Admitting: Family Medicine

## 2017-03-30 ENCOUNTER — Ambulatory Visit (INDEPENDENT_AMBULATORY_CARE_PROVIDER_SITE_OTHER): Payer: Medicare Other | Admitting: Family Medicine

## 2017-03-30 VITALS — BP 110/64 | HR 55 | Temp 97.9°F | Ht 64.5 in | Wt 175.0 lb

## 2017-03-30 DIAGNOSIS — E785 Hyperlipidemia, unspecified: Secondary | ICD-10-CM

## 2017-03-30 DIAGNOSIS — E1140 Type 2 diabetes mellitus with diabetic neuropathy, unspecified: Secondary | ICD-10-CM

## 2017-03-30 DIAGNOSIS — Z0000 Encounter for general adult medical examination without abnormal findings: Secondary | ICD-10-CM

## 2017-03-30 DIAGNOSIS — F0390 Unspecified dementia without behavioral disturbance: Secondary | ICD-10-CM | POA: Diagnosis not present

## 2017-03-30 DIAGNOSIS — I4891 Unspecified atrial fibrillation: Secondary | ICD-10-CM | POA: Diagnosis not present

## 2017-03-30 DIAGNOSIS — J449 Chronic obstructive pulmonary disease, unspecified: Secondary | ICD-10-CM | POA: Diagnosis not present

## 2017-03-30 DIAGNOSIS — E559 Vitamin D deficiency, unspecified: Secondary | ICD-10-CM

## 2017-03-30 DIAGNOSIS — M79644 Pain in right finger(s): Secondary | ICD-10-CM

## 2017-03-30 DIAGNOSIS — Z23 Encounter for immunization: Secondary | ICD-10-CM

## 2017-03-30 DIAGNOSIS — I5032 Chronic diastolic (congestive) heart failure: Secondary | ICD-10-CM | POA: Diagnosis not present

## 2017-03-30 DIAGNOSIS — I10 Essential (primary) hypertension: Secondary | ICD-10-CM

## 2017-03-30 DIAGNOSIS — F17200 Nicotine dependence, unspecified, uncomplicated: Secondary | ICD-10-CM | POA: Diagnosis not present

## 2017-03-30 DIAGNOSIS — I4901 Ventricular fibrillation: Secondary | ICD-10-CM | POA: Diagnosis not present

## 2017-03-30 DIAGNOSIS — F03B0 Unspecified dementia, moderate, without behavioral disturbance, psychotic disturbance, mood disturbance, and anxiety: Secondary | ICD-10-CM

## 2017-03-30 LAB — COMPREHENSIVE METABOLIC PANEL
ALK PHOS: 105 U/L (ref 39–117)
ALT: 11 U/L (ref 0–53)
AST: 12 U/L (ref 0–37)
Albumin: 3.7 g/dL (ref 3.5–5.2)
BUN: 21 mg/dL (ref 6–23)
CHLORIDE: 104 meq/L (ref 96–112)
CO2: 27 meq/L (ref 19–32)
Calcium: 8.6 mg/dL (ref 8.4–10.5)
Creatinine, Ser: 1.28 mg/dL (ref 0.40–1.50)
GFR: 56.85 mL/min — ABNORMAL LOW (ref >60.00)
Glucose, Bld: 112 mg/dL — ABNORMAL HIGH (ref 70–99)
Potassium: 4.1 mEq/L (ref 3.5–5.1)
SODIUM: 139 meq/L (ref 135–145)
Total Bilirubin: 0.6 mg/dL (ref 0.2–1.2)
Total Protein: 6.2 g/dL (ref 6.0–8.3)

## 2017-03-30 LAB — CBC WITH DIFFERENTIAL/PLATELET
BASOS PCT: 0.4 % (ref 0.0–3.0)
Basophils Absolute: 0 10*3/uL (ref 0.0–0.1)
EOS ABS: 0.2 10*3/uL (ref 0.0–0.7)
Eosinophils Relative: 1.8 % (ref 0.0–5.0)
HCT: 33.4 % — ABNORMAL LOW (ref 39.0–52.0)
Hemoglobin: 11.1 g/dL — ABNORMAL LOW (ref 13.0–17.0)
Lymphocytes Relative: 27.4 % (ref 12.0–46.0)
Lymphs Abs: 2.9 10*3/uL (ref 0.7–4.0)
MCHC: 33.3 g/dL (ref 30.0–36.0)
MCV: 93.6 fl (ref 78.0–100.0)
MONO ABS: 1.4 10*3/uL — AB (ref 0.1–1.0)
Monocytes Relative: 13.2 % — ABNORMAL HIGH (ref 3.0–12.0)
NEUTROS ABS: 6.1 10*3/uL (ref 1.4–7.7)
Neutrophils Relative %: 57.2 % (ref 43.0–77.0)
PLATELETS: 194 10*3/uL (ref 150.0–400.0)
RBC: 3.57 Mil/uL — ABNORMAL LOW (ref 4.22–5.81)
RDW: 15.3 % (ref 11.5–15.5)
WBC: 10.7 10*3/uL — AB (ref 4.0–10.5)

## 2017-03-30 LAB — LIPID PANEL
Cholesterol: 102 mg/dL (ref 0–200)
HDL: 32.9 mg/dL — AB (ref >39.00)
LDL Cholesterol: 49 mg/dL (ref 0–99)
NONHDL: 69.05
TRIGLYCERIDES: 99 mg/dL (ref 0.0–149.0)
Total CHOL/HDL Ratio: 3
VLDL: 19.8 mg/dL (ref 0.0–40.0)

## 2017-03-30 LAB — HEMOGLOBIN A1C: Hgb A1c MFr Bld: 7 % — ABNORMAL HIGH (ref 4.6–6.5)

## 2017-03-30 LAB — VITAMIN D 25 HYDROXY (VIT D DEFICIENCY, FRACTURES): VITD: 33.32 ng/mL (ref 30.00–100.00)

## 2017-03-30 NOTE — Progress Notes (Signed)
Subjective:   [name] is a 83 y.o. male who presents for Medicare Annual/Subsequent preventive examination.  Review of Systems:  N/A Cardiac Risk Factors include: advanced age (>4men, >82 women);male gender;diabetes mellitus;dyslipidemia;hypertension;obesity (BMI >30kg/m2)     Objective:    Vitals: BP 110/64 (BP Location: Right Arm, Patient Position: Sitting, Cuff Size: Normal)   Pulse (!) 55   Temp 97.9 F (36.6 C) (Oral)   Ht 5' 4.5" (1.638 m) Comment: shoes; unable to stand erect  Wt 175 lb (79.4 kg)   SpO2 98%   BMI 29.57 kg/m   Body mass index is 29.57 kg/m.  Advanced Directives 03/30/2017 03/27/2016 01/25/2016 08/31/2014 08/26/2014 02/28/2013 05/01/2011  Does Patient Have a Medical Advance Directive? Yes Yes Yes Yes Yes Patient does not have advance directive;Patient would not like information Patient does not have advance directive;Patient would not like information  Type of Scientist, forensic Power of [place];Living will [place];Living will Healthcare Power of [place];Living will [place] - -  Does patient want to make changes to medical advance directive? - No - Patient declined No - Patient declined No - Patient declined - - -  Copy of [place] in Chart? Yes No - copy requested No - copy requested No - copy requested - - -  Pre-existing out of facility DNR order (yellow form or pink MOST form) - - - - - No -    Tobacco Social History   Tobacco Use  Smoking Status Current Every Day Smoker  . Packs/day: 1.00  . Years: 60.00  . Pack years: 60.00  . Types: Cigarettes  Smokeless Tobacco Never Used  Tobacco Comment   Smoker since 46, quit for 2 years and started back in 03/2008     Ready to quit: No Counseling given: No Comment: Smoker since 4, quit for 2 years and started back in 03/2008   Clinical Intake:  Pre-visit preparation completed:  Yes  Pain : No/denies pain Pain Score: 5      Nutritional Status: BMI > 30  Obese Nutritional Risks: None Diabetes: Yes CBG done?: No Did pt. bring in CBG monitor from home?: No  How often do you need to have someone help you when you read instructions, pamphlets, or other written materials from your doctor or pharmacy?: 1 - Never What is the last grade level you completed in school?: 10th grade  Interpreter Needed?: No  Comments: pt lives alone Information entered by :: LPinson, LPN  Past Medical History:  Diagnosis Date  . AICD (automatic cardioverter/defibrillator) present   . Anemia    iron deficiency anemia with previous severe GI bleed   . Arthritis    back and knees  . Atrial fibrillation ([place])    s/p AV node ablation; not on coumadin due to GIB  . 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 CAD (coronary artery disease)    Mild, nonobstructive (LHC 1/07: mLAD 20%, pCFX 20-30%, mRCA 30%, EF 25%)  . Cancer [facility])    prostate  . Cardiac arrest - ventricular fibrillation 02/2008   Aborted, shocked by ICD  . Cervical radiculopathy    left  . CHF (congestive heart failure) (HCC)    secondary to nonischemic cardiomyopathy; ECHO 10/07 EF 20-25%, mod to severe MR; ECHO 1/10 EF 55-60%, mild MR; ECHO 2/11 55-65%, grade 1 diast dysfxn, miod to mod LAE, mild TR;  S/P Medtronic BiV ICD with biventricular function now turned off due to diahragmatic simulation  . Chronic kidney disease   . Chronic neck pain   . COPD (chronic obstructive pulmonary disease) (HCC)    GOLD II; Spirometry 07/10/2008 >FEV1 1.46 56% predicted, ratio of 66%  . Dementia   . Diabetes mellitus    type 2 NIDDM x 5-6 yrs  . Dyspnea    exertional  . GERD (gastroesophageal reflux disease)   . Hyperlipidemia   . Hypertension   . Inguinal hernia    left; asymptomatic  . Left bundle branch block   . Medical history non-contributory   . Obesity   . Pneumonia    01/2011  . Presence of  permanent cardiac pacemaker   . Prostatic hypertrophy 09-11-97   Benign  . Torticollis    Past Surgical History:  Procedure Laterality Date  . ANTERIOR CERVICAL DISCECTOMY  02/2010  . BIV ICD GENERTAOR CHANGE OUT N/A 02/08/2013   Procedure: BIV ICD GENERTAOR CHANGE OUT;  Surgeon: Deboraha Sprang, MD;  Location: [facility] CATH LAB;  Service: Cardiovascular;  Laterality: N/A;  . CARDIAC CATHETERIZATION  01/2006   Showed mild nonobstructive CAD  . CARDIAC CATHETERIZATION  11/05/2006   Right atrial pressure mean of 12, RV pressure 36/8, PA pressure 39/16 witha mean of 28, wedge pressure was 20. Fick cardiac output was 5 liters per minute, cardiac index was 2.4  . COLONOSCOPY  06/26/2002   Multip (neg) divertics, int hemm  . COLONOSCOPY  07/10/2004   Poyps, divertics, int hemms  . COLONOSCOPY  06/19/2008   2 polyps divertics int hemms (Dr Henrene Pastor)  . COLONOSCOPY W/ POLYPECTOMY  11/1997   Divertics, int hemms  . CORONARY ANGIOPLASTY     Min abstrut zd severe LB dystn EF 25%  . CT HEAD LIMITED W/O CM  10/03/2006   No acute abnmlty  . ESOPHAGOGASTRODUODENOSCOPY  09/1997   Prepylor ulcer, esoph ring, duod avm  . ESOPHAGOGASTRODUODENOSCOPY  05/22/2002   Poss Barrett's   . ESOPHAGOGASTRODUODENOSCOPY  02/19/2006   HH No active bleeding  . EYE SURGERY     cataract, bilateral  . HOSP  8/14-8/23/2008   Acute on chronis CHF IIIB NOnisch Cardiomyop EF 20-25% Mod-Sev MR  . INSERT / REPLACE / REMOVE PACEMAKER  2007  . KNEE ARTHROSCOPY  1988  . KNEE SURGERY  1996   Left  . LOBECTOMY  01-21-98   lung  . LUMBAR LAMINECTOMY/DECOMPRESSION MICRODISCECTOMY  04/29/2011   Procedure: LUMBAR LAMINECTOMY/DECOMPRESSION MICRODISCECTOMY;  Surgeon: Eustace Moore, MD;  Location: [facility] NEURO ORS;  Service: Neurosurgery;  Laterality: N/A;  Lumbar Two, Three, Four, Five Decompressive Lumbar Laminectomies  . MCH GI BLEED  02/07-02/12/2002  . MCH SOB  10/11-10/18/2007   A fib, CHF  . MCH x  11/08-11/03/2006   Acute blood  loss, anemia, sys HF, isch cardiomyopathy  . ROTATOR CUFF REPAIR  1994 and 1995   Family History  Problem Relation Age of Onset  . Heart failure Mother        CHF  . Heart failure Father        CHF  . Hypertension Sister   . Obesity Sister   . Cancer Brother        lung cancer  . Lung cancer Brother   . Liver disease Brother        ETOH  . Alcohol abuse Brother   . Cancer Brother   . COPD Brother   . Cancer Brother   .  Anxiety disorder Brother   . COPD Brother   . Hypertension Sister    Social History   Socioeconomic History  . Marital status: Married    Spouse name: None  . Number of children: None  . Years of education: None  . Highest education level: None  Social Needs  . Financial resource strain: None  . Food insecurity - worry: None  . Food insecurity - inability: None  . Transportation needs - medical: None  . Transportation needs - non-medical: None  Occupational History  . 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 Smoking status: Current Every Day Smoker    Packs/day: 1.00    Years: 60.00    Pack years: 60.00    Types: Cigarettes  . Smokeless tobacco: Never Used  . Tobacco comment: Smoker since 85, quit for 2 years and started back in 03/2008  Substance and Sexual Activity  . Alcohol use: Yes    Alcohol/week: 0.0 oz    Comment: rarely  . Drug use: No  . Sexual activity: Not Currently  Other Topics Concern  . None  Social History Narrative   Has living will: Full Code.   HCPOA: wife and daughter      Married, lives with wife and 1 adopted child    Outpatient Encounter Medications as of 03/30/2017  Medication Sig  . acetaminophen (TYLENOL) 500 MG tablet Take 500 mg by mouth every 6 (six) hours as needed for mild pain.   Marland Kitchen allopurinol (ZYLOPRIM) 100 MG tablet TAKE 1 TABLET DAILY  . aspirin EC 81 MG tablet Take 81 mg by mouth 2 (two) times daily.  Marland Kitchen atorvastatin (LIPITOR) 20 MG  tablet TAKE 1 TABLET DAILY AT     BEDTIME  . AVODART 0.5 MG capsule TAKE 1 CAPSULE DAILY  . carvedilol (COREG) 3.125 MG tablet TAKE 1 TABLET TWICE A DAY  WITH MEALS  . Cholecalciferol (TH VITAMIN D3) 2000 UNITS CAPS Take 2,000 Units by mouth daily.   Marland Kitchen DEXILANT 60 MG capsule TAKE 1 CAPSULE DAILY  . donepezil (ARICEPT) 10 MG tablet TAKE 1 TABLET AT BEDTIME  . eplerenone (INSPRA) 25 MG tablet Take 0.5 tablets (12.5 mg total) by mouth daily.  . furosemide (LASIX) 40 MG tablet Take 1 tablet (40 mg total) by mouth daily.  Marland Kitchen gabapentin (NEURONTIN) 300 MG capsule TAKE 1 CAPSULE TWICE DAILY  . guaiFENesin (MUCINEX) 600 MG 12 hr tablet Take 1,200 mg by mouth 2 (two) times daily as needed for cough.   Marland Kitchen KLOR-CON M20 20 MEQ tablet TAKE 1 TABLET TWICE A DAY  . lisinopril (PRINIVIL,ZESTRIL) 2.5 MG tablet Take 1 tablet (2.5 mg total) by mouth daily.  Marland Kitchen PROAIR HFA 108 (90 Base) MCG/ACT inhaler FOR DIRECTIONS ON HOW TO   TAKE THIS MEDICINE, READ   THE ENCLOSED MEDICATION    INFORMATION FORM  . SPIRIVA HANDIHALER 18 MCG inhalation capsule INHALE THE CONTENTS OF ONE CAPSULE DAILY IN THE       MORNING VIA HANDIHALER     DEVICE  . tamsulosin (FLOMAX) 0.4 MG CAPS capsule Take 1 capsule (0.4 mg total) by mouth daily after supper.   No facility-administered encounter medications on file as of 03/30/2017.     Activities of Daily Living In your present state of health, do you have any difficulty performing the following activities: 03/30/2017  Hearing? Y  Vision? N  Difficulty concentrating or making decisions?  Y  Walking or climbing stairs? Y  Dressing or bathing? N  Doing errands, shopping? N  Preparing Food and eating ? N  Using the Toilet? N  In the past six months, have you accidently leaked urine? Y  Do you have problems with loss of bowel control? N  Managing your Medications? N  Managing your Finances? N  Housekeeping or managing your Housekeeping? N  Some recent data might be hidden    Patient  Care Team: Jinny Sanders, MD as PCP - General (Family Medicine) Bensimhon, Shaune Pascal, MD (Cardiology) Bensimhon, Shaune Pascal, MD (Cardiology) Deboraha Sprang, MD (Cardiology)   Assessment:   This is a routine wellness examination for Shan.    Hearing Screening   125Hz  250Hz  500Hz  1000Hz  2000Hz  3000Hz  4000Hz  6000Hz  8000Hz   Right ear:   0 0 0  0    Left ear:   0 0 0  0      Visual Acuity Screening   Right eye Left eye Both eyes  Without correction: 20/25-2 20/20-2 20/20-1  With correction:        Exercise Activities and Dietary recommendations Current Exercise Habits: The patient does not participate in regular exercise at present, Exercise limited by: orthopedic condition(s)  Goals    . Follow up with Primary Care Provider     Starting 03/30/2017, I will continue to take medications as prescribed and to keep appointments with PCP as scheduled.        Fall Risk Fall Risk  03/30/2017 03/27/2016 12/09/2015 12/22/2013  Falls in the past year? Yes No Yes No  Comment - - Emmi Telephone Survey: data to providers prior to load -  Number falls in past yr: 1 - 2 or more -  Comment - - Emmi Telephone Survey Actual Response = 3 -  Injury with Fall? - - Yes -   Depression Screen PHQ 2/9 Scores 03/30/2017 03/27/2016 12/22/2013  PHQ - 2 Score 1 0 0  PHQ- 9 Score 1 - -    Cognitive Function MMSE - Mini Mental State Exam 03/30/2017  Not completed: (No Data)        Immunization History  Administered Date(s) Administered  . Influenza Split 02/05/2011, 02/18/2012  . Influenza Whole 01/12/2004, 01/13/2007, 01/12/2008, 01/31/2009, 01/29/2010  . Influenza, High Dose Seasonal PF 03/30/2017  . Influenza,inj,Quad PF,6+ Mos 01/20/2013, 12/22/2013, 03/14/2015, 01/24/2016  . Meningococcal Polysaccharide 03/14/1999  . Pneumococcal Conjugate-13 12/22/2013  . Pneumococcal Polysaccharide-23 01/29/2010  . Td 11/11/2005, 03/30/2017  . Zoster 04/11/2013    Screening Tests Health Maintenance    Topic Date Due  . OPHTHALMOLOGY EXAM  04/12/2018 (Originally 08/02/2014)  . HEMOGLOBIN A1C  09/28/2017  . FOOT EXAM  03/30/2018  . TETANUS/TDAP  03/31/2027  . INFLUENZA VACCINE  Completed  . PNA vac Low Risk Adult  Completed     Plan:    I have personally reviewed, addressed, and noted the following in the patient's chart:  A. Medical and social history B. Use of alcohol, tobacco or illicit drugs  C. Current medications and supplements D. Functional ability and status E.  Nutritional status F.  Physical activity G. Advance directives H. List of other physicians I.  Hospitalizations, surgeries, and ER visits in previous 12 months J.  [place] to include hearing, vision, cognitive, depression L. Referrals and appointments - none  In addition, I have reviewed and discussed with patient certain preventive protocols, quality metrics, and best practice recommendations. A written personalized care plan for preventive  services as well as general preventive health recommendations were provided to patient.  See attached scanned questionnaire for additional information.   Signed,   Lindell Noe, MHA, BS, LPN Health Coach

## 2017-03-30 NOTE — Patient Instructions (Signed)
[name] , Thank you for taking time to come for your Medicare Wellness Visit. I appreciate your ongoing commitment to your health goals. Please review the following plan we discussed and let me know if I can assist you in the future.   These are the goals we discussed: Goals    . Follow up with Primary Care Provider     Starting 03/30/2017, I will continue to take medications as prescribed and to keep appointments with PCP as scheduled.        This is a list of the screening recommended for you and due dates:  Health Maintenance  Topic Date Due  . Tetanus Vaccine  03/30/2018*  . Eye exam for diabetics  04/12/2018*  . Hemoglobin A1C  09/28/2017  . Complete foot exam   03/30/2018  . Flu Shot  Completed  . Pneumonia vaccines  Completed  *Topic was postponed. The date shown is not the original due date.   Preventive Care for Adults  A healthy lifestyle and preventive care can promote health and wellness. Preventive health guidelines for adults include the following key practices.  . A routine yearly physical is a good way to check with your health care provider about your health and preventive screening. It is a chance to share any concerns and updates on your health and to receive a thorough exam.  . Visit your dentist for a routine exam and preventive care every 6 months. Brush your teeth twice a day and floss once a day. Good oral hygiene prevents tooth decay and gum disease.  . The frequency of eye exams is based on your age, health, family medical history, use  of contact lenses, and other factors. Follow your health care provider's recommendations for frequency of eye exams.  . Eat a healthy diet. Foods like vegetables, fruits, whole grains, low-fat dairy products, and lean protein foods contain the nutrients you need without too many calories. Decrease your intake of foods high in solid fats, added sugars, and salt. Eat the right amount of calories for you. Get information  about a proper diet from your health care provider, if necessary.  . Regular physical exercise is one of the most important things you can do for your health. Most adults should get at least 150 minutes of moderate-intensity exercise (any activity that increases your heart rate and causes you to sweat) each week. In addition, most adults need muscle-strengthening exercises on 2 or more days a week.  Silver Sneakers may be a benefit available to you. To determine eligibility, you may visit the website: www.silversneakers.com or contact program at (769)257-0594 Mon-Fri between 8AM-8PM.   . Maintain a healthy weight. The body mass index (BMI) is a screening tool to identify possible weight problems. It provides an estimate of body fat based on height and weight. Your health care provider can find your BMI and can help you achieve or maintain a healthy weight.   For adults 20 years and older: ? A BMI below 18.5 is considered underweight. ? A BMI of 18.5 to 24.9 is normal. ? A BMI of 25 to 29.9 is considered overweight. ? A BMI of 30 and above is considered obese.   . Maintain normal blood lipids and cholesterol levels by exercising and minimizing your intake of saturated fat. Eat a balanced diet with plenty of fruit and vegetables. Blood tests for lipids and cholesterol should begin at age 55 and be repeated every 5 years. If your lipid or cholesterol levels are  high, you are over 50, or you are at high risk for heart disease, you may need your cholesterol levels checked more frequently. Ongoing high lipid and cholesterol levels should be treated with medicines if diet and exercise are not working.  . If you smoke, find out from your health care provider how to quit. If you do not use tobacco, please do not start.  . If you choose to drink alcohol, please do not consume more than 2 drinks per day. One drink is considered to be 12 ounces (355 mL) of beer, 5 ounces (148 mL) of wine, or 1.5 ounces (44  mL) of liquor.  . If you are 62-72 years old, ask your health care provider if you should take aspirin to prevent strokes.  . Use sunscreen. Apply sunscreen liberally and repeatedly throughout the day. You should seek shade when your shadow is shorter than you. Protect yourself by wearing long sleeves, pants, a wide-brimmed hat, and sunglasses year round, whenever you are outdoors.  . Once a month, do a whole body skin exam, using a mirror to look at the skin on your back. Tell your health care provider of new moles, moles that have irregular borders, moles that are larger than a pencil eraser, or moles that have changed in shape or color.

## 2017-03-30 NOTE — Progress Notes (Signed)
Pre visit review using our clinic review tool, if applicable. No additional management support is needed unless otherwise documented below in the visit note. 

## 2017-03-30 NOTE — Progress Notes (Signed)
Subjective:    Patient ID: [name], male    DOB: May 28, 1930, 83 y.o.   MRN: [id number]  HPI The patient presents for complete physical and review of chronic health problems. He/She also has the following acute concerns today: pain in left hand yesterday when moving a organ.  Has noted some swelling , no redness.  No fall.  No bad enough to take a med.  The patient saw Candis Musa, LPN for medicare wellness. Note reviewed in detail and important notes copied below.  His wife passed away in last 2 months.. He is doing fairly well.  Afib, polymorphic vent tach, CHF, afib: Followed by Cardiology, Dr. Jerald Kief Euvolemic on lasix 40 mg daily.  No SOB, no new  Chest pain.  Diabetes:   At goal on no med. Feet problems:no ulcers Blood Sugars averaging: not checking eye exam within last year:  Elevated Cholesterol: Due for re-eval. High risk for CVD, goal < 70 On lipitor Using medications without problems:None Muscle aches: None Diet compliance: moderate Exercise: none Other complaints:  Vit D in nml range   Anemia, iron def, chronic  Due for re-eval. No constipation with iron.   Gout, no flares in last year on allopurinol 100 mg daily.   COPD, stable on current meds. Spiriva daily, using albuterol twice daily prn.   Hypertension:    Good control BP Readings from Last 3 Encounters:  03/30/17 110/64  03/30/17 110/64  01/29/17 118/65  Using medication without problems or lightheadedness:  occ Chest pain with exertion: none Edema:none Short of breath:stable Average home BPs: Other issues:  Using gabapentin to 2 a day for peripheral nueropthy Pt  continues to use his hoverround daily  around housefor mobility. This enables him to complete ADLs such as toileting, feeding and bathing.   Patient Care Team: Jinny Sanders, MD as PCP - General (Family Medicine) Jolaine Artist, MD (Cardiology) Jolaine Artist, MD (Cardiology) Deboraha Sprang, MD  (Cardiology) Dermatology: Dr. Denna Haggard   Social History [facility] History/Past Medical History reviewed in detail and updated in EMR if needed. Vitals:   03/30/17 1154  BP: 110/64  Pulse: (!) 55  Temp: 97.9 F (36.6 C)  SpO2: 98%         Objective:   Physical Exam  Constitutional: Vital signs are normal. He appears well-developed and well-nourished.  Elderly in NAD  HENT:  Head: Normocephalic.  Right Ear: Hearing normal.  Left Ear: Hearing normal.  Nose: Nose normal.  Mouth/Throat: Oropharynx is clear and moist and mucous membranes are normal.  Neck: Trachea normal. Carotid bruit is not present. No thyroid mass and no thyromegaly present.  Cardiovascular: Normal rate, regular rhythm and normal pulses. Exam reveals no gallop, no distant heart sounds and no friction rub.  No murmur heard. No peripheral edema  Pulmonary/Chest: Effort normal and breath sounds normal. No respiratory distress.  Musculoskeletal:       Right wrist: Normal.  Arthritis deformity in bilateral hands Pain and swelling, no redness or heat at right [facility] joint  Neurological: He is alert. He is not disoriented. He displays atrophy. No cranial nerve deficit or sensory deficit. He exhibits abnormal muscle tone. Coordination and gait abnormal.     Skin: Skin is warm, dry and intact. No rash noted.  Psychiatric: He has a normal mood and affect. His speech is normal and behavior is normal. Thought content normal.          Assessment & Plan:  The patient's  preventative maintenance and recommended screening tests for an annual wellness exam were reviewed in full today. Brought up to date unless services declined.  Counselled on the importance of diet, exercise, and its role in overall health and mortality. The patient's FH and SH was reviewed, including their home life, tobacco status, and drug and alcohol status.    Vaccines:  Given td and fl shot today Prostate and colon cancer screening: no indicated.

## 2017-03-30 NOTE — Progress Notes (Signed)
PCP notes:   Health maintenance:  Eye exam - addressed Flu vaccine - administered Tetanus vaccine - administered A1C - completed Foot exam - PCP please address at next appt  Abnormal screenings:    Fall risk - hx of fall without injury Depression score: 1 Hearing - failed  Hearing Screening   125Hz  250Hz  500Hz  1000Hz  2000Hz  3000Hz  4000Hz  6000Hz  8000Hz   Right ear:   0 0 0  0    Left ear:   0 0 0  0     Patient concerns:   Pt has intermittent pain in left thumb. Thumb was injured while moving furniture.  Nurse concerns:  None  Next PCP appt:   03/30/17 @ 1115

## 2017-03-30 NOTE — Patient Instructions (Signed)
Can try flonase 2 sprays per nostril.  Call if left thumb not continuing to improve.

## 2017-03-31 ENCOUNTER — Encounter: Payer: Self-pay | Admitting: *Deleted

## 2017-03-31 NOTE — Progress Notes (Signed)
I reviewed health advisor's note, was available for consultation, and agree with documentation and plan.  

## 2017-04-05 DIAGNOSIS — M79644 Pain in right finger(s): Secondary | ICD-10-CM | POA: Insufficient documentation

## 2017-04-05 NOTE — Assessment & Plan Note (Signed)
ASA anticoag. Followed by cardiology.

## 2017-04-05 NOTE — Assessment & Plan Note (Signed)
Refuses cessation 

## 2017-04-05 NOTE — Assessment & Plan Note (Signed)
No clear gout or fracture.  Most likely OA pain.  Symptomatic  Care.. Per pt improving with time.

## 2017-04-05 NOTE — Assessment & Plan Note (Signed)
At goal on no med. Encouraged exercise, weight loss, healthy eating habits.  

## 2017-04-05 NOTE — Assessment & Plan Note (Signed)
Euvolemic today on current regimen.

## 2017-04-05 NOTE — Assessment & Plan Note (Signed)
Refuses smoking cessation. Spiriva daily, using albuterol twice daily prn.

## 2017-04-05 NOTE — Assessment & Plan Note (Signed)
Due for re-eval. High risk for CVD, goal < 70 On lipitor

## 2017-04-05 NOTE — Assessment & Plan Note (Signed)
Well controlled. Continue current medication.  

## 2017-04-05 NOTE — Assessment & Plan Note (Signed)
Stable , not worsening per pt and daughter.

## 2017-04-05 NOTE — Assessment & Plan Note (Signed)
Followed by cardiology 

## 2017-04-18 ENCOUNTER — Other Ambulatory Visit: Payer: Self-pay | Admitting: Family Medicine

## 2017-04-22 ENCOUNTER — Ambulatory Visit (INDEPENDENT_AMBULATORY_CARE_PROVIDER_SITE_OTHER): Payer: Medicare Other | Admitting: Family Medicine

## 2017-04-22 ENCOUNTER — Other Ambulatory Visit: Payer: Self-pay

## 2017-04-22 ENCOUNTER — Encounter: Payer: Self-pay | Admitting: Family Medicine

## 2017-04-22 VITALS — BP 106/60 | HR 78 | Temp 97.4°F | Ht 64.5 in | Wt 179.5 lb

## 2017-04-22 DIAGNOSIS — M79605 Pain in left leg: Secondary | ICD-10-CM

## 2017-04-22 DIAGNOSIS — M48061 Spinal stenosis, lumbar region without neurogenic claudication: Secondary | ICD-10-CM | POA: Diagnosis not present

## 2017-04-22 DIAGNOSIS — G8929 Other chronic pain: Secondary | ICD-10-CM

## 2017-04-22 DIAGNOSIS — M542 Cervicalgia: Secondary | ICD-10-CM

## 2017-04-22 DIAGNOSIS — R2689 Other abnormalities of gait and mobility: Secondary | ICD-10-CM | POA: Diagnosis not present

## 2017-04-22 DIAGNOSIS — J449 Chronic obstructive pulmonary disease, unspecified: Secondary | ICD-10-CM | POA: Diagnosis not present

## 2017-04-22 DIAGNOSIS — M79604 Pain in right leg: Secondary | ICD-10-CM | POA: Diagnosis not present

## 2017-04-22 NOTE — Progress Notes (Addendum)
Subjective:    Patient ID: [name], male    DOB: Dec 17, 1930, 84 y.o.   MRN: [id number]  HPI   84 year old  fere for mobility assessment for medical necessity of continued use of Hoverround.   His  Hoverround broke.Marland Kitchen He needs new one. Needs prescription for new one.  He has history of spinal stenosis in cervical region, had complications of surgery with blood clot.   Since then he has had weakness in upper and lower legs. Decreased grip strength on bilaterally. Cannot open bottles. Chronic pain in legs. He has severe balance issues.  He has fallen multiple times.  He has decreased ROM of abduction with right arm. Hx of  failed rotator cuff surgery. He has chronic shortness of breath with CHF and COPD.  he has Bilateral lower extremity edema   THE PATIENT CONTINUES TO USE THE POWER WHEELCHAIR. He uses Hoverround 12 hours a day at home.  He can only walk short distances.[name], walker not adequate for his needs given leg weakness and pain.  He cannot use manual wheelchair given decreased arms strength and grip. He cannot use scooter given size limitations of house and hallways.  Blood pressure 106/60, pulse 78, temperature (!) 97.4 F (36.3 C), temperature source Oral, height 5' 4.5" (1.638 m), weight 179 lb 8 oz (81.4 kg).    O2 saturation from 03/30/2017 OV 98%   Review of Systems  Constitutional: Negative for fatigue and fever.  HENT: Negative for ear pain.   Eyes: Negative for pain.  Respiratory: Positive for shortness of breath. Negative for cough.   Cardiovascular: Negative for chest pain, palpitations and leg swelling.  Gastrointestinal: Negative for abdominal pain.  Genitourinary: Negative for dysuria.  Musculoskeletal: Negative for arthralgias.  Neurological: Negative for syncope, light-headedness and headaches.  Psychiatric/Behavioral: Negative for dysphoric mood.       Objective:   Physical Exam  Constitutional: Vital signs are normal. He appears  well-developed and well-nourished. No distress.  Does appear a bit pale but daughter feels this is basline  HENT:  Head: Normocephalic and atraumatic.  Right Ear: Hearing normal.  Left Ear: Hearing normal.  Nose: Nose normal.  Mouth/Throat: Oropharynx is clear and moist and mucous membranes are normal.  Eyes: Conjunctivae are normal.  Neck: Trachea normal. Carotid bruit is not present. No thyroid mass and no thyromegaly present.  Cardiovascular: Normal rate, regular rhythm and normal pulses. Exam reveals no gallop, no distant heart sounds and no friction rub.  No murmur heard.  Bilateral edema 1 plus nonpitting with chronic venous stasis dermatitis  Pulmonary/Chest: Effort normal. No respiratory distress. He has wheezes.  Musculoskeletal: He exhibits edema.       Right knee: He exhibits decreased range of motion and deformity.       Left knee: He exhibits decreased range of motion and deformity.       Cervical back: He exhibits decreased range of motion, tenderness and bony tenderness. He exhibits no swelling.  Arthritis deformity in bilateral hands   RUE: 4/5 LUE 4/5 RLE: 4/5 LLE4/5   except grip on right 3/5  Neurological: He is alert. He is not disoriented. He displays atrophy. No cranial nerve deficit or sensory deficit. He exhibits abnormal muscle tone. Coordination and gait abnormal.    shuffling gait, unstable on feet  can only walk a few steps without support.  Skin: Skin is warm, dry and intact. No rash noted. He is not diaphoretic.  Psychiatric: He has a normal  mood and affect. His speech is normal and behavior is normal. Judgment and thought content normal.  Nursing note and vitals reviewed.      Assessment & Plan:   Decreased mobility due to SOB from COPD, chronic neck pain, Bilateral arm and leg weakness.. Resulting in need for hover-round to use house.  PMD is necessary to perform ADLS at home including getting to bathroom to toilet, getting to kitchen to eat and  cooking, getting to bedroom to dress etc.  He cannot use a cane/walker given his poor balance and arm  And leg weakness.  His arms are to weak to propel and manual wheelchair and it increases his neck pain. A scooter is to wide for the hallways of his home.  He can operate an hoveround safely, both mentally and physically.  he is willing and motivated to use the device.Marland Kitchen He currently uses it daily.

## 2017-04-29 ENCOUNTER — Telehealth: Payer: Self-pay

## 2017-04-29 NOTE — Telephone Encounter (Signed)
Hoverround notified by telephone that form was received and is in Dr. Rometta Emery in box to completed.  She is not back in office until Friday 04/30/17.

## 2017-04-29 NOTE — Telephone Encounter (Signed)
Copied from [place] [id number]. Topic: General - Other >> Apr 29, 2017 12:06 PM Carolyn Stare wrote:  [name] with hoover round power wheel chair call to ask if a fax she sent over labeled product final step was received and would like a call back   [phone]   ref number [id number]

## 2017-05-05 ENCOUNTER — Other Ambulatory Visit: Payer: Self-pay | Admitting: Family Medicine

## 2017-05-06 ENCOUNTER — Other Ambulatory Visit: Payer: Self-pay | Admitting: Family Medicine

## 2017-05-11 ENCOUNTER — Telehealth: Payer: Self-pay | Admitting: Family Medicine

## 2017-05-11 MED ORDER — LISINOPRIL 2.5 MG PO TABS
2.5000 mg | ORAL_TABLET | Freq: Every day | ORAL | 3 refills | Status: DC
Start: 2017-05-11 — End: 2017-10-05

## 2017-05-11 NOTE — Telephone Encounter (Signed)
Refill sent to [facility].

## 2017-05-11 NOTE — Telephone Encounter (Signed)
Copied from [place] [id number]. Topic: Quick Communication - Rx Refill/Question >> May 11, 2017 11:13 AM Scherrie Gerlach wrote: Medication: lisinopril (PRINIVIL,ZESTRIL) 2.5 MG tablet  Pt was prescribed this 03/29/16 90 day with 3 refills  (15 mos) [facility] states this rx has expired and pt needs to call the dr to get a needs rx sent to them.  Pt has been out over a week and needs sent in asap please  [facility] [place] [place] [facility] [employer] [phone] (Phone) [phone] (Fax)

## 2017-05-12 ENCOUNTER — Telehealth: Payer: Self-pay

## 2017-05-12 NOTE — Telephone Encounter (Signed)
Spoke with [employer]over[employer] and advised that I have not seen a fax for a detailed product description.  She will refax paperwork now.

## 2017-05-12 NOTE — Telephone Encounter (Signed)
Forms received and placed in Dr. Rometta Emery in box to review and sign.  Dr. Diona Browner is not back in the office until Friday 05/14/2017.

## 2017-05-12 NOTE — Telephone Encounter (Signed)
Copied from [place] [id number]. Topic: General - Other >> May 12, 2017 10:29 AM Carolyn Stare wrote:  [name] with [employer] call to ask if paperwork was received that they faxed over yesterday for the doctor to sign it's called a detail product description  would  like call back   [phone]    ref number [id number]

## 2017-05-14 NOTE — Telephone Encounter (Signed)
Detailed Product Description form signed at faxed back to [facility] [phone].

## 2017-06-10 ENCOUNTER — Ambulatory Visit (INDEPENDENT_AMBULATORY_CARE_PROVIDER_SITE_OTHER): Payer: Medicare Other | Admitting: *Deleted

## 2017-06-10 DIAGNOSIS — I428 Other cardiomyopathies: Secondary | ICD-10-CM | POA: Diagnosis not present

## 2017-06-10 NOTE — Progress Notes (Signed)
Remote ICD transmission.   

## 2017-06-11 ENCOUNTER — Encounter: Payer: Self-pay | Admitting: Cardiology

## 2017-06-26 LAB — CUP PACEART REMOTE DEVICE CHECK
Brady Statistic AP VP Percent: 0 %
Brady Statistic AP VS Percent: 0 %
Brady Statistic AS VP Percent: 0 %
Brady Statistic AS VS Percent: 0 %
Brady Statistic RA Percent Paced: 0 %
Brady Statistic RV Percent Paced: 97.7 %
Date Time Interrogation Session: 20190228094223
HIGH POWER IMPEDANCE MEASURED VALUE: 39 Ohm
HighPow Impedance: 45 Ohm
Implantable Lead Implant Date: 20080513
Implantable Lead Implant Date: 20080513
Implantable Lead Location: 753858
Implantable Lead Location: 753859
Implantable Lead Location: 753860
Implantable Lead Model: 5076
Lead Channel Impedance Value: 342 Ohm
Lead Channel Impedance Value: 399 Ohm
Lead Channel Impedance Value: 4047 Ohm
Lead Channel Impedance Value: 4047 Ohm
Lead Channel Impedance Value: 456 Ohm
Lead Channel Impedance Value: 456 Ohm
Lead Channel Pacing Threshold Amplitude: 2.375 V
Lead Channel Pacing Threshold Pulse Width: 0.8 ms
Lead Channel Sensing Intrinsic Amplitude: 0.5 mV
Lead Channel Sensing Intrinsic Amplitude: 9.5 mV
Lead Channel Setting Pacing Amplitude: 2.5 V
Lead Channel Setting Pacing Pulse Width: 0.4 ms
Lead Channel Setting Sensing Sensitivity: 0.3 mV
MDC IDC LEAD IMPLANT DT: 20080513
MDC IDC MSMT BATTERY REMAINING LONGEVITY: 22 mo
MDC IDC MSMT BATTERY VOLTAGE: 2.92 V
MDC IDC MSMT LEADCHNL RA SENSING INTR AMPL: 0.375 mV
MDC IDC MSMT LEADCHNL RV PACING THRESHOLD AMPLITUDE: 0.875 V
MDC IDC MSMT LEADCHNL RV PACING THRESHOLD PULSEWIDTH: 0.4 ms
MDC IDC MSMT LEADCHNL RV SENSING INTR AMPL: 9.5 mV
MDC IDC PG IMPLANT DT: 20141029
MDC IDC SET LEADCHNL LV PACING AMPLITUDE: 2.5 V
MDC IDC SET LEADCHNL LV PACING PULSEWIDTH: 0.8 ms

## 2017-06-29 ENCOUNTER — Encounter: Payer: Self-pay | Admitting: Internal Medicine

## 2017-06-29 ENCOUNTER — Ambulatory Visit (INDEPENDENT_AMBULATORY_CARE_PROVIDER_SITE_OTHER): Payer: Medicare Other | Admitting: Internal Medicine

## 2017-06-29 VITALS — BP 112/60 | HR 61 | Ht 63.0 in | Wt 186.0 lb

## 2017-06-29 DIAGNOSIS — I428 Other cardiomyopathies: Secondary | ICD-10-CM

## 2017-06-29 DIAGNOSIS — I5032 Chronic diastolic (congestive) heart failure: Secondary | ICD-10-CM

## 2017-06-29 DIAGNOSIS — I480 Paroxysmal atrial fibrillation: Secondary | ICD-10-CM

## 2017-06-29 DIAGNOSIS — Z95810 Presence of automatic (implantable) cardiac defibrillator: Secondary | ICD-10-CM | POA: Diagnosis not present

## 2017-06-29 NOTE — Patient Instructions (Addendum)
Medication Instructions:  Your physician recommends that you continue on your current medications as directed. Please refer to the Current Medication list given to you today.  Labwork: None ordered.  Testing/Procedures: None ordered.  Follow-Up: Your physician recommends that you schedule a follow-up appointment in: One Year with Dr Caryl Comes  Remote monitoring is used to monitor your Pacemaker from home. This monitoring reduces the number of office visits required to check your device to one time per year. It allows [place] to keep an eye on the functioning of your device to ensure it is working properly. You are scheduled for a device check from home on 09/09/2017. You may send your transmission at any time that day. If you have a wireless device, the transmission will be sent automatically. After your physician reviews your transmission, you will receive a postcard with your next transmission date.    Any Other Special Instructions Will Be Listed Below (If Applicable).     If you need a refill on your cardiac medications before your next appointment, please call your pharmacy.

## 2017-06-29 NOTE — Progress Notes (Signed)
Patient Care Team: Jinny Sanders, MD as PCP - General (Family Medicine) Bensimhon, Shaune Pascal, MD (Cardiology) Bensimhon, Shaune Pascal, MD (Cardiology) Deboraha Sprang, MD (Cardiology)   HPI  [name] is a 84 y.o. male seen in followup for CRT-D implantation in the setting of nonischemic myopathy and atrial fibrillation. Previously he was on warfarin. This was discontinued because of GI bleeding and he has been treated with aspirin  He is status post AV junction ablation. He has had intercurrent normalization of LV systolic function.  November 2010 he had syncope with appropriate VF therapy For polymorphic ventricular tachycardia he has some lightheadedness with standing but no syncope His lisinopril was decreased 10>>5   His device reached ERI 10/14 and he underwent generator replacement as an ICD CRT at that time.   The patient denies chest pain, shortness of breath, nocturnal dyspnea, orthopnea or peripheral edema.  There have been no palpitations, lightheadedness or syncope.    He and his daughter both think he is doing great  Walks some   Date Cr K Hgb  12/18 1.28 4.1 11.1         His wife of 67 years died in 25-Feb-2023.  She used to call [name]  Past Medical History:  Diagnosis Date  . AICD (automatic cardioverter/defibrillator) present   . Anemia    iron deficiency anemia with previous severe GI bleed   . Arthritis    back and knees  . Atrial fibrillation ([place])    s/p AV node ablation; not on coumadin due to GIB  . 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 CAD (coronary artery disease)    Mild, nonobstructive (LHC 1/07: mLAD 20%, pCFX 20-30%, mRCA 30%, EF 25%)  . Cancer [facility])    prostate  . Cardiac arrest - ventricular fibrillation 02/2008   Aborted, shocked by ICD  . Cervical radiculopathy    left  . CHF (congestive heart failure) (HCC)    secondary to nonischemic cardiomyopathy; ECHO 10/07 EF 20-25%, mod to severe MR; ECHO 1/10 EF  55-60%, mild MR; ECHO 2/11 55-65%, grade 1 diast dysfxn, miod to mod LAE, mild TR;    S/P Medtronic BiV ICD with biventricular function now turned off due to diahragmatic simulation  . Chronic kidney disease   . Chronic neck pain   . COPD (chronic obstructive pulmonary disease) (HCC)    GOLD II; Spirometry 07/10/2008 >FEV1 1.46 56% predicted, ratio of 66%  . Dementia   . Diabetes mellitus    type 2 NIDDM x 5-6 yrs  . Dyspnea    exertional  . GERD (gastroesophageal reflux disease)   . Hyperlipidemia   . Hypertension   . Inguinal hernia    left; asymptomatic  . Left bundle branch block   . Medical history non-contributory   . Obesity   . Pneumonia    02/25/2011  . Presence of permanent cardiac pacemaker   . Prostatic hypertrophy 09-11-97   Benign  . Torticollis     Past Surgical History:  Procedure Laterality Date  . ANTERIOR CERVICAL DISCECTOMY  02/2010  . BIV ICD GENERTAOR CHANGE OUT N/A February 24, 2013   Procedure: BIV ICD GENERTAOR CHANGE OUT;  Surgeon: Deboraha Sprang, MD;  Location[facility] C[facility];  Service: Cardiovascular;  Laterality: N/A;  . CARDIAC CATHETERIZATION  02-24-2006   Showed mild nonobstructive CAD  . CARDIAC CATHETERIZATION  11/05/2006   Right atrial pressure mean of 12, RV pressure 36/8, PA  pressure 39/16 witha mean of 28, wedge pressure was 20. Fick cardiac output was 5 liters per minute, cardiac index was 2.4  . COLONOSCOPY  06/26/2002   Multip (neg) divertics, int hemm  . COLONOSCOPY  07/10/2004   Poyps, divertics, int hemms  . COLONOSCOPY  06/19/2008   2 polyps divertics int hemms (Dr Henrene Pastor)  . COLONOSCOPY W/ POLYPECTOMY  11/1997   Divertics, int hemms  . CORONARY ANGIOPLASTY     Min abstrut zd severe LB dystn EF 25%  . CT HEAD LIMITED W/O CM  10/03/2006   No acute abnmlty  . ESOPHAGOGASTRODUODENOSCOPY  09/1997   Prepylor ulcer, esoph ring, duod avm  . ESOPHAGOGASTRODUODENOSCOPY  05/22/2002   Poss Barrett's   . ESOPHAGOGASTRODUODENOSCOPY  02/19/2006   HH No  active bleeding  . EYE SURGERY     cataract, bilateral  . HOSP  8/14-8/23/2008   Acute on chronis CHF IIIB NOnisch Cardiomyop EF 20-25% Mod-Sev MR  . INSERT / REPLACE / REMOVE PACEMAKER  2007  . KNEE ARTHROSCOPY  1988  . KNEE SURGERY  1996   Left  . LOBECTOMY  01-21-98   lung  . LUMBAR LAMINECTOMY/DECOMPRESSION MICRODISCECTOMY  04/29/2011   Procedure: LUMBAR LAMINECTOMY/DECOMPRESSION MICRODISCECTOMY;  Surgeon: Eustace Moore, MD;  Location: [place] [facility]URO ORS;  Service: Neurosurgery;  Laterality: N/A;  Lumbar Two, Three, Four, Five Decompressive Lumbar Laminectomies  . [facility] GI BLEED  02/07-02/12/2002  . [facility] SOB  10/11-10/18/2007   A fib, CHF  . [facility] x  11/08-11/03/2006   Acute blood loss, anemia, sys HF, isch cardiomyopathy  . ROTATOR CUFF REPAIR  1994 and 1995    Current Outpatient Medications  Medication Sig Dispense Refill  . acetaminophen (TYLENOL) 500 MG tablet Take 500 mg by mouth every 6 (six) hours as needed for mild pain.     Marland Kitchen allopurinol (ZYLOPRIM) 100 MG tablet TAKE 1 TABLET DAILY 90 tablet 3  . aspirin EC 81 MG tablet Take 81 mg by mouth 2 (two) times daily.    Marland Kitchen atorvastatin (LIPITOR) 20 MG tablet TAKE 1 TABLET DAILY AT     BEDTIME 90 tablet 3  . AVODART 0.5 MG capsule TAKE 1 CAPSULE DAILY 90 capsule 1  . carvedilol (COREG) 3.125 MG tablet TAKE 1 TABLET TWICE A DAY  WITH MEALS 180 tablet 3  . Cholecalciferol (TH VITAMIN D3) 2000 UNITS CAPS Take 2,000 Units by mouth daily.     Marland Kitchen DEXILANT 60 MG capsule TAKE 1 CAPSULE DAILY 90 capsule 3  . donepezil (ARICEPT) 10 MG tablet TAKE 1 TABLET AT BEDTIME 90 tablet 3  . eplerenone (INSPRA) 25 MG tablet Take 0.5 tablets (12.5 mg total) by mouth daily. 45 tablet 3  . furosemide (LASIX) 40 MG tablet Take 1 tablet (40 mg total) by mouth daily. 90 tablet 3  . gabapentin (NEURONTIN) 300 MG capsule TAKE 1 CAPSULE TWICE DAILY 180 capsule 3  . guaiFENesin (MUCINEX) 600 MG 12 hr tablet Take 1,200 mg by mouth 2 (two) times daily as needed for cough.      Marland Kitchen KLOR-CON M20 20 MEQ tablet TAKE 1 TABLET TWICE A DAY 180 tablet 3  . lisinopril (PRINIVIL,ZESTRIL) 2.5 MG tablet Take 1 tablet (2.5 mg total) by mouth daily. 90 tablet 3  . PROAIR HFA 108 (90 Base) MCG/ACT inhaler FOR DIRECTIONS ON HOW TO   TAKE THIS MEDICINE, READ   THE ENCLOSED MEDICATION    INFORMATION FORM 25.5 g 3  . SPIRIVA HANDIHALER 18 MCG inhalation capsule INHALE  THE CONTENTS OF ONE CAPSULE DAILY IN THE       MORNING VIA HANDIHALER     DEVICE 90 capsule 3  . tamsulosin (FLOMAX) 0.4 MG CAPS capsule TAKE 1 CAPSULE DAILY AFTER SUPPER 90 capsule 3   No current facility-administered medications for this visit.     No Known Allergies  Review of Systems negative except from HPI and PMH  Physical Exam BP 112/60   Pulse 61   Ht 5\' 3"  (1.6 m)   Wt 186 lb (84.4 kg)   SpO2 98%   BMI 32.95 kg/m  Well developed and nourished in no acute distress in a wheel chair  HENT normal Neck supple with JVP-flat Clear Regular rate and rhythm, no murmurs or gallops Abd-soft with active BS No Clubbing cyanosis edema Skin-warm and dry A & Oriented  Grossly normal sensory and motor function   ECG Afib with Bi V pacing   Assessment and  Plan  Atrial fibrillation-permanent  Cardiomyopathy-resolved  CRT-D-Medtronic  Anemia- chronic  Complete heart block  Grief   Euvolemic continue current meds  Not on anticoagulation with hx of prior GI bleeding    We spent more than 50% of our >25 min visit in face to face counseling regarding the above

## 2017-07-05 LAB — CUP PACEART INCLINIC DEVICE CHECK
Battery Voltage: 2.92 V
Brady Statistic AP VP Percent: 0 %
Brady Statistic AS VP Percent: 96.08 %
Brady Statistic RA Percent Paced: 0 %
Date Time Interrogation Session: 20190319195614
HighPow Impedance: 41 Ohm
HighPow Impedance: 51 Ohm
Implantable Lead Implant Date: 20080513
Implantable Lead Implant Date: 20080513
Implantable Lead Location: 753858
Implantable Lead Model: 4193
Implantable Lead Model: 6947
Lead Channel Impedance Value: 4047 Ohm
Lead Channel Impedance Value: 437 Ohm
Lead Channel Impedance Value: 456 Ohm
Lead Channel Pacing Threshold Pulse Width: 0.4 ms
Lead Channel Pacing Threshold Pulse Width: 0.8 ms
Lead Channel Sensing Intrinsic Amplitude: 0.375 mV
Lead Channel Sensing Intrinsic Amplitude: 0.5 mV
Lead Channel Sensing Intrinsic Amplitude: 9.5 mV
Lead Channel Setting Pacing Amplitude: 2.5 V
Lead Channel Setting Pacing Pulse Width: 0.4 ms
MDC IDC LEAD IMPLANT DT: 20080513
MDC IDC LEAD LOCATION: 753859
MDC IDC LEAD LOCATION: 753860
MDC IDC MSMT BATTERY REMAINING LONGEVITY: 23 mo
MDC IDC MSMT LEADCHNL LV IMPEDANCE VALUE: 4047 Ohm
MDC IDC MSMT LEADCHNL LV PACING THRESHOLD AMPLITUDE: 2.5 V
MDC IDC MSMT LEADCHNL RV IMPEDANCE VALUE: 437 Ohm
MDC IDC MSMT LEADCHNL RV IMPEDANCE VALUE: 494 Ohm
MDC IDC MSMT LEADCHNL RV PACING THRESHOLD AMPLITUDE: 0.75 V
MDC IDC MSMT LEADCHNL RV SENSING INTR AMPL: 9.5 mV
MDC IDC PG IMPLANT DT: 20141029
MDC IDC SET LEADCHNL LV PACING PULSEWIDTH: 0.8 ms
MDC IDC SET LEADCHNL RV PACING AMPLITUDE: 2.5 V
MDC IDC SET LEADCHNL RV SENSING SENSITIVITY: 0.3 mV
MDC IDC STAT BRADY AP VS PERCENT: 0 %
MDC IDC STAT BRADY AS VS PERCENT: 3.92 %
MDC IDC STAT BRADY RV PERCENT PACED: 97.77 %

## 2017-07-08 ENCOUNTER — Other Ambulatory Visit (HOSPITAL_COMMUNITY): Payer: Self-pay | Admitting: Internal Medicine

## 2017-08-24 ENCOUNTER — Other Ambulatory Visit (HOSPITAL_COMMUNITY): Payer: Self-pay | Admitting: Internal Medicine

## 2017-09-09 ENCOUNTER — Ambulatory Visit (INDEPENDENT_AMBULATORY_CARE_PROVIDER_SITE_OTHER): Payer: Medicare Other | Admitting: *Deleted

## 2017-09-09 DIAGNOSIS — I428 Other cardiomyopathies: Secondary | ICD-10-CM | POA: Diagnosis not present

## 2017-09-09 DIAGNOSIS — I5032 Chronic diastolic (congestive) heart failure: Secondary | ICD-10-CM

## 2017-09-09 NOTE — Progress Notes (Signed)
Remote ICD transmission.   

## 2017-09-13 ENCOUNTER — Encounter: Payer: Self-pay | Admitting: Cardiology

## 2017-09-14 LAB — CUP PACEART REMOTE DEVICE CHECK
Battery Voltage: 2.91 V
Brady Statistic AP VP Percent: 0 %
Brady Statistic AP VS Percent: 0 %
Brady Statistic AS VP Percent: 100 %
Brady Statistic AS VS Percent: 0 %
Date Time Interrogation Session: 20190530042203
HIGH POWER IMPEDANCE MEASURED VALUE: 45 Ohm
HIGH POWER IMPEDANCE MEASURED VALUE: 57 Ohm
Implantable Lead Implant Date: 20080513
Lead Channel Impedance Value: 4047 Ohm
Lead Channel Impedance Value: 456 Ohm
Lead Channel Impedance Value: 570 Ohm
Lead Channel Pacing Threshold Amplitude: 2.25 V
Lead Channel Pacing Threshold Pulse Width: 0.4 ms
Lead Channel Pacing Threshold Pulse Width: 0.8 ms
Lead Channel Sensing Intrinsic Amplitude: 0.375 mV
Lead Channel Sensing Intrinsic Amplitude: 9.5 mV
Lead Channel Sensing Intrinsic Amplitude: 9.5 mV
Lead Channel Setting Pacing Amplitude: 2.5 V
Lead Channel Setting Pacing Amplitude: 2.5 V
Lead Channel Setting Pacing Pulse Width: 0.4 ms
Lead Channel Setting Sensing Sensitivity: 0.3 mV
MDC IDC LEAD IMPLANT DT: 20080513
MDC IDC LEAD IMPLANT DT: 20080513
MDC IDC LEAD LOCATION: 753858
MDC IDC LEAD LOCATION: 753859
MDC IDC LEAD LOCATION: 753860
MDC IDC MSMT BATTERY REMAINING LONGEVITY: 20 mo
MDC IDC MSMT LEADCHNL LV IMPEDANCE VALUE: 4047 Ohm
MDC IDC MSMT LEADCHNL RA IMPEDANCE VALUE: 456 Ohm
MDC IDC MSMT LEADCHNL RA SENSING INTR AMPL: 0.5 mV
MDC IDC MSMT LEADCHNL RV IMPEDANCE VALUE: 665 Ohm
MDC IDC MSMT LEADCHNL RV PACING THRESHOLD AMPLITUDE: 0.625 V
MDC IDC PG IMPLANT DT: 20141029
MDC IDC SET LEADCHNL LV PACING PULSEWIDTH: 0.8 ms
MDC IDC STAT BRADY RA PERCENT PACED: 0 %
MDC IDC STAT BRADY RV PERCENT PACED: 97.98 %

## 2017-09-17 ENCOUNTER — Ambulatory Visit: Payer: Self-pay | Admitting: Podiatry

## 2017-09-24 ENCOUNTER — Telehealth: Payer: Self-pay | Admitting: Family Medicine

## 2017-09-24 DIAGNOSIS — E1140 Type 2 diabetes mellitus with diabetic neuropathy, unspecified: Secondary | ICD-10-CM

## 2017-09-24 DIAGNOSIS — M109 Gout, unspecified: Secondary | ICD-10-CM

## 2017-09-24 DIAGNOSIS — E559 Vitamin D deficiency, unspecified: Secondary | ICD-10-CM

## 2017-09-24 DIAGNOSIS — E785 Hyperlipidemia, unspecified: Secondary | ICD-10-CM

## 2017-09-24 DIAGNOSIS — E538 Deficiency of other specified B group vitamins: Secondary | ICD-10-CM

## 2017-09-24 NOTE — Telephone Encounter (Signed)
-----   Message from Ellamae Sia sent at 09/21/2017 10:18 AM EDT ----- Regarding: Lab orders for Monday, 6.17.19 Lab orders for a 6 month follow up appt

## 2017-09-27 ENCOUNTER — Other Ambulatory Visit (INDEPENDENT_AMBULATORY_CARE_PROVIDER_SITE_OTHER): Payer: Medicare Other

## 2017-09-27 DIAGNOSIS — M109 Gout, unspecified: Secondary | ICD-10-CM

## 2017-09-27 DIAGNOSIS — E538 Deficiency of other specified B group vitamins: Secondary | ICD-10-CM

## 2017-09-27 DIAGNOSIS — E1140 Type 2 diabetes mellitus with diabetic neuropathy, unspecified: Secondary | ICD-10-CM | POA: Diagnosis not present

## 2017-09-27 DIAGNOSIS — E559 Vitamin D deficiency, unspecified: Secondary | ICD-10-CM

## 2017-09-27 DIAGNOSIS — E785 Hyperlipidemia, unspecified: Secondary | ICD-10-CM

## 2017-09-27 LAB — HEMOGLOBIN A1C: HEMOGLOBIN A1C: 7.2 % — AB (ref 4.6–6.5)

## 2017-09-27 LAB — COMPREHENSIVE METABOLIC PANEL
ALBUMIN: 3.9 g/dL (ref 3.5–5.2)
ALT: 10 U/L (ref 0–53)
AST: 12 U/L (ref 0–37)
Alkaline Phosphatase: 111 U/L (ref 39–117)
BUN: 20 mg/dL (ref 6–23)
CHLORIDE: 105 meq/L (ref 96–112)
CO2: 29 meq/L (ref 19–32)
CREATININE: 1.39 mg/dL (ref 0.40–1.50)
Calcium: 9.4 mg/dL (ref 8.4–10.5)
GFR: 51.63 mL/min — ABNORMAL LOW (ref >60.00)
Glucose, Bld: 110 mg/dL — ABNORMAL HIGH (ref 70–99)
Potassium: 4.9 mEq/L (ref 3.5–5.1)
SODIUM: 141 meq/L (ref 135–145)
Total Bilirubin: 0.5 mg/dL (ref 0.2–1.2)
Total Protein: 6.6 g/dL (ref 6.0–8.3)

## 2017-09-27 LAB — LIPID PANEL
CHOL/HDL RATIO: 4
Cholesterol: 112 mg/dL (ref 0–200)
HDL: 26.5 mg/dL — ABNORMAL LOW (ref >39.00)
LDL CALC: 58 mg/dL (ref 0–99)
NonHDL: 85.49
Triglycerides: 135 mg/dL (ref 0.0–149.0)
VLDL: 27 mg/dL (ref 0.0–40.0)

## 2017-09-27 LAB — VITAMIN D 25 HYDROXY (VIT D DEFICIENCY, FRACTURES): VITD: 57.67 ng/mL (ref 30.00–100.00)

## 2017-09-27 LAB — VITAMIN B12: Vitamin B-12: 178 pg/mL — ABNORMAL LOW (ref 211–911)

## 2017-09-27 LAB — URIC ACID: URIC ACID, SERUM: 7.1 mg/dL (ref 4.0–7.8)

## 2017-10-01 ENCOUNTER — Ambulatory Visit: Payer: Medicare Other | Admitting: Family Medicine

## 2017-10-05 ENCOUNTER — Encounter: Payer: Self-pay | Admitting: Family Medicine

## 2017-10-05 ENCOUNTER — Ambulatory Visit (INDEPENDENT_AMBULATORY_CARE_PROVIDER_SITE_OTHER): Payer: Medicare Other | Admitting: Family Medicine

## 2017-10-05 VITALS — BP 120/60 | HR 78 | Temp 98.3°F | Ht 64.5 in | Wt 182.5 lb

## 2017-10-05 DIAGNOSIS — E1140 Type 2 diabetes mellitus with diabetic neuropathy, unspecified: Secondary | ICD-10-CM | POA: Diagnosis not present

## 2017-10-05 DIAGNOSIS — D508 Other iron deficiency anemias: Secondary | ICD-10-CM

## 2017-10-05 DIAGNOSIS — E785 Hyperlipidemia, unspecified: Secondary | ICD-10-CM

## 2017-10-05 DIAGNOSIS — M109 Gout, unspecified: Secondary | ICD-10-CM

## 2017-10-05 DIAGNOSIS — K5909 Other constipation: Secondary | ICD-10-CM

## 2017-10-05 DIAGNOSIS — I4891 Unspecified atrial fibrillation: Secondary | ICD-10-CM | POA: Diagnosis not present

## 2017-10-05 DIAGNOSIS — I5032 Chronic diastolic (congestive) heart failure: Secondary | ICD-10-CM

## 2017-10-05 DIAGNOSIS — E538 Deficiency of other specified B group vitamins: Secondary | ICD-10-CM | POA: Diagnosis not present

## 2017-10-05 DIAGNOSIS — J449 Chronic obstructive pulmonary disease, unspecified: Secondary | ICD-10-CM

## 2017-10-05 DIAGNOSIS — I10 Essential (primary) hypertension: Secondary | ICD-10-CM

## 2017-10-05 MED ORDER — FUROSEMIDE 40 MG PO TABS: 40.0000 mg | ORAL_TABLET | Freq: Every day | ORAL | 3 refills | Status: DC

## 2017-10-05 MED ORDER — LINACLOTIDE 145 MCG PO CAPS: 145.0000 ug | ORAL_CAPSULE | Freq: Every day | ORAL | 3 refills | Status: DC

## 2017-10-05 MED ORDER — LISINOPRIL 2.5 MG PO TABS: 2.5000 mg | ORAL_TABLET | Freq: Every day | ORAL | 3 refills | Status: DC

## 2017-10-05 NOTE — Assessment & Plan Note (Signed)
Well controlled. Continue current medication.  

## 2017-10-05 NOTE — Assessment & Plan Note (Signed)
Stopped iron given constipation.. recheck HG and iron levels at next OV.

## 2017-10-05 NOTE — Assessment & Plan Note (Signed)
Worsened control.. Get back on track with diet changes.

## 2017-10-05 NOTE — Assessment & Plan Note (Signed)
Trial of linzess.

## 2017-10-05 NOTE — Patient Instructions (Addendum)
Stop miralax. Keep up with water and fiber.  Start linaclotide daily for chronic constipation. Call with update in next few months.  Work on low carb ( sweets and starches) diet, increase walking as much as able.  Restart B12 1000 mcg daily.

## 2017-10-05 NOTE — Progress Notes (Signed)
   Subjective:    Patient ID: [name], male    DOB: 1931-03-31, 84 y.o.   MRN: [id number]  HPI  84 year old male presetns for 6 month follow up DM.  Diabetes:   Lab Results  Component Value Date   HGBA1C 7.2 (H) 09/27/2017   Using medications without difficulties: Hypoglycemic episodes: Hyperglycemic episodes: Feet problems: Blood Sugars averaging: eye exam within last year:  Elevated Cholesterol:  Good control on atorvastatin. Lab Results  Component Value Date   CHOL 112 09/27/2017   HDL 26.50 (L) 09/27/2017   LDLCALC 58 09/27/2017   LDLDIRECT 44.5 05/25/2012   TRIG 135.0 09/27/2017   CHOLHDL 4 09/27/2017  Using medications without problems: Muscle aches:  Diet compliance: Exercise:minimal Other complaints:   GOUT:  On allopurinol, no recent flare in last year. Lab Results  Component Value Date   LABURIC 7.1 09/27/2017    Vit B12 very low.   Chronic constipation:Has had as  Child as well. No blood in stool.  Miralax make him go after 5-6 days.. But then tends toward diarrhea.   He is also has noted worsening nocturia.. Urinating 3 times a night unless very constipated.  ON flomax.  Social History /Family History/Past Medical History reviewed in detail and updated in EMR if needed. Blood pressure 120/60, pulse 78, temperature 98.3 F (36.8 C), temperature source Oral, height 5' 4.5" (1.638 m), weight 182 lb 8 oz (82.8 kg), SpO2 99 %.   Review of Systems  Constitutional: Negative for fatigue and fever.  HENT: Negative for ear pain.   Eyes: Negative for pain.  Respiratory: Negative for cough and shortness of breath.   Cardiovascular: Positive for leg swelling. Negative for chest pain and palpitations.       Ran out of lasix in last few days so has some more swelling in ankles.  Gastrointestinal: Positive for constipation and diarrhea. Negative for abdominal pain.  Genitourinary: Negative for dysuria.  Musculoskeletal: Negative for arthralgias.    Neurological: Negative for syncope, light-headedness and headaches.  Psychiatric/Behavioral: Negative for dysphoric mood.       Objective:   Physical Exam  Constitutional: Vital signs are normal. He appears well-developed and well-nourished.  Elderly in NAD  HENT:  Head: Normocephalic.  Right Ear: Hearing normal.  Left Ear: Hearing normal.  Nose: Nose normal.  Mouth/Throat: Oropharynx is clear and moist and mucous membranes are normal.  Neck: Trachea normal. Carotid bruit is not present. No thyroid mass and no thyromegaly present.  Cardiovascular: Normal rate, regular rhythm and normal pulses. Exam reveals no gallop, no distant heart sounds and no friction rub.  No murmur heard. No peripheral edema  Pulmonary/Chest: Effort normal. No respiratory distress. He has decreased breath sounds. He has rhonchi.  Musculoskeletal:       Right wrist: Normal.  Arthritis deformity in bilateral hands Pain and swelling, no redness or heat at right [facility] joint  Neurological: He is alert. He is not disoriented. He displays atrophy. No cranial nerve deficit or sensory deficit. He exhibits abnormal muscle tone. Coordination and gait abnormal.     Skin: Skin is warm, dry and intact. No rash noted.  Psychiatric: He has a normal mood and affect. His speech is normal and behavior is normal. Thought content normal.    Diabetic foot exam: Normal inspection No skin breakdown B calluses  Normal DP pulses Decreased sensation to light touch and monofilament Nails thickened       Assessment & Plan:

## 2017-10-05 NOTE — Assessment & Plan Note (Signed)
Fluid overload, mild.Marland Kitchen Restart lasix.

## 2017-10-05 NOTE — Assessment & Plan Note (Signed)
Followed by cards.. Rate controlled.

## 2017-10-05 NOTE — Assessment & Plan Note (Signed)
Refuses smoking cessation.. Symptoms stable on spiriva.

## 2017-10-05 NOTE — Assessment & Plan Note (Signed)
No recent flares on allopurinol. Uric acid increasing some.. Watch low uric diet.

## 2017-12-09 ENCOUNTER — Ambulatory Visit (INDEPENDENT_AMBULATORY_CARE_PROVIDER_SITE_OTHER): Payer: Medicare Other | Admitting: *Deleted

## 2017-12-09 DIAGNOSIS — I428 Other cardiomyopathies: Secondary | ICD-10-CM

## 2017-12-09 DIAGNOSIS — I5032 Chronic diastolic (congestive) heart failure: Secondary | ICD-10-CM

## 2017-12-09 NOTE — Progress Notes (Signed)
Remote ICD transmission.   

## 2017-12-14 ENCOUNTER — Other Ambulatory Visit: Payer: Self-pay | Admitting: *Deleted

## 2017-12-14 MED ORDER — GABAPENTIN 300 MG PO CAPS: 300.0000 mg | ORAL_CAPSULE | Freq: Two times a day (BID) | ORAL | 3 refills | Status: DC

## 2017-12-14 MED ORDER — POTASSIUM CHLORIDE CRYS ER 20 MEQ PO TBCR: 20.0000 meq | EXTENDED_RELEASE_TABLET | Freq: Two times a day (BID) | ORAL | 3 refills | Status: DC

## 2017-12-14 MED ORDER — DONEPEZIL HCL 10 MG PO TABS: 10.0000 mg | ORAL_TABLET | Freq: Every day | ORAL | 3 refills | Status: DC

## 2017-12-14 MED ORDER — ALBUTEROL SULFATE HFA 108 (90 BASE) MCG/ACT IN AERS: INHALATION_SPRAY | RESPIRATORY_TRACT | 3 refills | Status: DC

## 2017-12-14 NOTE — Telephone Encounter (Signed)
Last office visit 10/05/2017 for constipation.  Follow up scheduled for 12/16/2017 and CPE scheduled 04/05/2018.  Last refilled 01/08/2017 for #180 with 3 refills.  Ok to refill?

## 2017-12-16 ENCOUNTER — Encounter: Payer: Self-pay | Admitting: Family Medicine

## 2017-12-16 ENCOUNTER — Ambulatory Visit (INDEPENDENT_AMBULATORY_CARE_PROVIDER_SITE_OTHER): Payer: Medicare Other | Admitting: Family Medicine

## 2017-12-16 VITALS — BP 116/62 | HR 71 | Temp 97.8°F | Ht 64.5 in | Wt 167.2 lb

## 2017-12-16 DIAGNOSIS — K5909 Other constipation: Secondary | ICD-10-CM

## 2017-12-16 DIAGNOSIS — Z23 Encounter for immunization: Secondary | ICD-10-CM

## 2017-12-16 MED ORDER — LINACLOTIDE 290 MCG PO CAPS: 290.0000 ug | ORAL_CAPSULE | Freq: Every day | ORAL | 3 refills | Status: DC

## 2017-12-16 NOTE — Progress Notes (Signed)
   Subjective:    Patient ID: [name], male    DOB: 09-19-1930, 84 y.o.   MRN: [id number]  HPI   84 year old male presents for follow up constipation.in years past.  he has been constipated since he was a child but he feels this is much worse than  Then. Tild as a child rectum was too small.   He reports he has been on linzess for  3 Months and is still constipated. Helped in first week but no longer as much. Also taking stool softner. BMs every  Has to sit on commode for 2 hours at a time. Has to strain with BMs. Firm breaks off. Painful BM. No blood. He feels like he has issues getting  Urine out.. Once bowels move then urine moves like a faucets.   He has also tried enemas, fiber, water, daily stool softner. Has not tried MOM. No longer on iron. No pain meds.   Last colonoscopy: 2010 low risk polyps, diverticulosis and hemorrhoids Dr Henrene Pastor.   Social History /Family History/Past Medical History reviewed in detail and updated in EMR if needed. Blood pressure 116/62, pulse 71, temperature 97.8 F (36.6 C), temperature source Oral, height 5' 4.5" (1.638 m), weight 167 lb 4 oz (75.9 kg), SpO2 98 %.   Review of Systems  Constitutional: Positive for fatigue. Negative for fever.  HENT: Negative for ear pain.   Eyes: Negative for pain.  Respiratory: Negative for cough and shortness of breath.   Cardiovascular: Negative for chest pain, palpitations and leg swelling.  Gastrointestinal: Positive for abdominal pain and constipation.  Genitourinary: Negative for dysuria.  Neurological: Negative for syncope, light-headedness and headaches.  Psychiatric/Behavioral: Negative for dysphoric mood.       Objective:   Physical Exam  Constitutional: Vital signs are normal. He appears well-developed and well-nourished.  Elderly in NAD  HENT:  Head: Normocephalic.  Right Ear: Hearing normal.  Left Ear: Hearing normal.  Nose: Nose normal.  Mouth/Throat: Oropharynx is clear and moist  and mucous membranes are normal.  Neck: Trachea normal. Carotid bruit is not present. No thyroid mass and no thyromegaly present.  Cardiovascular: Normal rate, regular rhythm and normal pulses. Exam reveals no gallop, no distant heart sounds and no friction rub.  No murmur heard. No peripheral edema  Pulmonary/Chest: Effort normal. No respiratory distress. He has decreased breath sounds. He has rhonchi.  Musculoskeletal:       Right wrist: Normal.  Arthritis deformity in bilateral hands Pain and swelling, no redness or heat at right [facility] joint  Neurological: He is alert. He is not disoriented. He displays atrophy. No cranial nerve deficit or sensory deficit. He exhibits abnormal muscle tone. Coordination and gait abnormal.     Skin: Skin is warm, dry and intact. No rash noted.  Psychiatric: He has a normal mood and affect. His speech is normal and behavior is normal. Thought content normal.          Assessment & Plan:

## 2017-12-16 NOTE — Patient Instructions (Addendum)
Can try milk of magnesia  And enema if needed today.  Increase linzess as instructed to 290 mg daily.  Keep up with water and fiber.  Please stop at the front desk to set up referral.

## 2017-12-29 ENCOUNTER — Encounter: Payer: Self-pay | Admitting: Family Medicine

## 2017-12-29 NOTE — Assessment & Plan Note (Signed)
Can try milk of magnesia  And enema if needed today.  Increase linzess as instructed to 290 mg daily.  Keep up with water and fiber.  Will refer to GI given persistent resistant constipation.

## 2017-12-31 LAB — CUP PACEART REMOTE DEVICE CHECK
Battery Remaining Longevity: 16 mo
Battery Voltage: 2.89 V
Brady Statistic AP VP Percent: 0 %
Brady Statistic AS VP Percent: 0 %
Brady Statistic AS VS Percent: 0 %
Brady Statistic RA Percent Paced: 0 %
Brady Statistic RV Percent Paced: 95.6 %
Date Time Interrogation Session: 20190829184209
HighPow Impedance: 43 Ohm
HighPow Impedance: 48 Ohm
Implantable Lead Implant Date: 20080513
Implantable Lead Location: 753858
Implantable Lead Location: 753859
Implantable Lead Model: 4193
Implantable Lead Model: 5076
Implantable Lead Model: 6947
Implantable Pulse Generator Implant Date: 20141029
Lead Channel Impedance Value: 4047 Ohm
Lead Channel Impedance Value: 456 Ohm
Lead Channel Pacing Threshold Amplitude: 0.875 V
Lead Channel Pacing Threshold Pulse Width: 0.4 ms
Lead Channel Sensing Intrinsic Amplitude: 0.5 mV
Lead Channel Setting Pacing Amplitude: 2.5 V
Lead Channel Setting Pacing Pulse Width: 0.8 ms
MDC IDC LEAD IMPLANT DT: 20080513
MDC IDC LEAD IMPLANT DT: 20080513
MDC IDC LEAD LOCATION: 753860
MDC IDC MSMT LEADCHNL LV IMPEDANCE VALUE: 4047 Ohm
MDC IDC MSMT LEADCHNL LV IMPEDANCE VALUE: 513 Ohm
MDC IDC MSMT LEADCHNL LV PACING THRESHOLD AMPLITUDE: 2 V
MDC IDC MSMT LEADCHNL LV PACING THRESHOLD PULSEWIDTH: 0.8 ms
MDC IDC MSMT LEADCHNL RA SENSING INTR AMPL: 0.375 mV
MDC IDC MSMT LEADCHNL RV IMPEDANCE VALUE: 380 Ohm
MDC IDC MSMT LEADCHNL RV IMPEDANCE VALUE: 456 Ohm
MDC IDC MSMT LEADCHNL RV SENSING INTR AMPL: 9.5 mV
MDC IDC MSMT LEADCHNL RV SENSING INTR AMPL: 9.5 mV
MDC IDC SET LEADCHNL RV PACING AMPLITUDE: 2.5 V
MDC IDC SET LEADCHNL RV PACING PULSEWIDTH: 0.4 ms
MDC IDC SET LEADCHNL RV SENSING SENSITIVITY: 0.3 mV
MDC IDC STAT BRADY AP VS PERCENT: 0 %

## 2018-01-31 ENCOUNTER — Encounter (INDEPENDENT_AMBULATORY_CARE_PROVIDER_SITE_OTHER): Payer: Self-pay

## 2018-01-31 ENCOUNTER — Ambulatory Visit (INDEPENDENT_AMBULATORY_CARE_PROVIDER_SITE_OTHER): Payer: Medicare Other | Admitting: Internal Medicine

## 2018-01-31 ENCOUNTER — Encounter: Payer: Self-pay | Admitting: Internal Medicine

## 2018-01-31 VITALS — BP 128/70 | HR 77 | Ht 65.0 in

## 2018-01-31 DIAGNOSIS — Z860100 Personal history of colon polyps, unspecified: Secondary | ICD-10-CM

## 2018-01-31 DIAGNOSIS — K5900 Constipation, unspecified: Secondary | ICD-10-CM | POA: Diagnosis not present

## 2018-01-31 NOTE — Progress Notes (Signed)
HISTORY OF PRESENT ILLNESS:  [name] is a 84 y.o. male with MULTIPLE SIGNIFICANT medical problems who was sent today by his primary care provider regarding constipation.  Patient reports a lifetime of problems with constipation.  He has not been seen in this office in nearly a decade.  He does have a history of adenomatous colon polyps for which she is undergone previous colonoscopy in 1999, 2004, 2006, and most recently 2010.  He has not been seen since.  Patient is accompanied today by his daughter.  He is wheelchair-bound and has been so for some time now.  He is known to have B12 deficient anemia.  He was seen by his primary care provider December 16, 2017 regarding constipation.  He was prescribed Linzess 145 mcg daily.  As best I can tell he is taking 1 daily and now has bowel movements 2-3 times per week.  He did try Colace but had an episode of incontinence.  When he is constipated he tells me that this affects his ability to urinate.  He also describes his stools as somewhat asymmetric coming out the right side of the rectum and hard.  No abdominal pain.  No rectal pain.  He is hard of hearing and somewhat pleasantly cantankerous.  Review of outside blood work from September 27, 2017 finds unremarkable comprehensive metabolic panel.  Review of interval x-rays include CT scan of the abdomen and pelvis with benign liver lesion and possible constipation.  REVIEW OF SYSTEMS:  All non-GI ROS negative unless otherwise stated in the HPI except for arthritis, back pain, hearing problems  Past Medical History:  Diagnosis Date  . AICD (automatic cardioverter/defibrillator) present   . Anemia    iron deficiency anemia with previous severe GI bleed   . Arthritis    back and knees  . Atrial fibrillation ([place])    s/p AV node ablation; not on coumadin due to GIB  . 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 CAD (coronary artery disease)    Mild, nonobstructive (LHC 1/07: mLAD 20%,  pCFX 20-30%, mRCA 30%, EF 25%)  . Cancer [facility])    prostate  . Cardiac arrest - ventricular fibrillation 02/2008   Aborted, shocked by ICD  . Cervical radiculopathy    left  . CHF (congestive heart failure) (HCC)    secondary to nonischemic cardiomyopathy; ECHO 10/07 EF 20-25%, mod to severe MR; ECHO 1/10 EF 55-60%, mild MR; ECHO 2/11 55-65%, grade 1 diast dysfxn, miod to mod LAE, mild TR;    S/P Medtronic BiV ICD with biventricular function now turned off due to diahragmatic simulation  . Chronic kidney disease   . Chronic neck pain   . COPD (chronic obstructive pulmonary disease) (HCC)    GOLD II; Spirometry 07/10/2008 >FEV1 1.46 56% predicted, ratio of 66%  . Dementia ([place])   . Diabetes mellitus    type 2 NIDDM x 5-6 yrs  . Dyspnea    exertional  . GERD (gastroesophageal reflux disease)   . Hyperlipidemia   . Hypertension   . Inguinal hernia    left; asymptomatic  . Left bundle branch block   . Medical history non-contributory   . Obesity   . Pneumonia    01/2011  . Presence of permanent cardiac pacemaker   . Prostatic hypertrophy 09-11-97   Benign  . Torticollis     Past Surgical History:  Procedure Laterality Date  . ANTERIOR CERVICAL DISCECTOMY  02/2010  .  BIV ICD GENERTAOR CHANGE OUT N/A 02/08/2013   Procedure: BIV ICD GENERTAOR CHANGE OUT;  Surgeon: Deboraha Sprang, MD;  Location: [facility] CATH LAB;  Service: Cardiovascular;  Laterality: N/A;  . CARDIAC CATHETERIZATION  01/2006   Showed mild nonobstructive CAD  . CARDIAC CATHETERIZATION  11/05/2006   Right atrial pressure mean of 12, RV pressure 36/8, PA pressure 39/16 witha mean of 28, wedge pressure was 20. Fick cardiac output was 5 liters per minute, cardiac index was 2.4  . COLONOSCOPY  06/26/2002   Multip (neg) divertics, int hemm  . COLONOSCOPY  07/10/2004   Poyps, divertics, int hemms  . COLONOSCOPY  06/19/2008   2 polyps divertics int hemms (Dr Henrene Pastor)  . COLONOSCOPY W/ POLYPECTOMY  11/1997   Divertics, int  hemms  . CORONARY ANGIOPLASTY     Min abstrut zd severe LB dystn EF 25%  . CT HEAD LIMITED W/O CM  10/03/2006   No acute abnmlty  . ESOPHAGOGASTRODUODENOSCOPY  09/1997   Prepylor ulcer, esoph ring, duod avm  . ESOPHAGOGASTRODUODENOSCOPY  05/22/2002   Poss Barrett's   . ESOPHAGOGASTRODUODENOSCOPY  02/19/2006   HH No active bleeding  . EYE SURGERY     cataract, bilateral  . HOSP  8/14-8/23/2008   Acute on chronis CHF IIIB N[place]isch Cardiomyop EF 20-25% Mod-Sev MR  . INSERT / REPLACE / REMOVE PACEMAKER  2007  . KNEE ARTHROSCOPY  1988  . KNEE SURGERY  1996   Left  . LOBECTOMY  01-21-98   lung  . LUMBAR LAMINECTOMY/DECOMPRESSION MICRODISCECTOMY  04/29/2011   Procedure: LUMBAR LAMINECTOMY/DECOMPRESSION MICRODISCECTOMY;  Surgeon: Eustace Moore, MD;  Location: [place] NEURO ORS;  Service: Neurosurgery;  Laterality: N/A;  Lumbar Two, Three, Four, Five Decompressive Lumbar Laminectomies  . MCH GI BLEED  02/07-02/12/2002  . MCH SOB  10/11-10/18/2007   A fib, CHF  . MCH x  11/08-11/03/2006   Acute blood loss, anemia, sys HF, isch cardiomyopathy  . [place] and 1995    Social History [name]  reports that he has been smoking cigarettes. He has a 60.00 pack-year smoking history. He has never used smokeless tobacco. He reports that he drinks alcohol. He reports that he does not use drugs.  family history includes Alcohol abuse in his brother; Anxiety disorder in his brother; COPD in his brother and brother; Cancer in his brother, brother, and brother; Heart failure in his father and mother; Hypertension in his sister and sister; Liver disease in his brother; Lung cancer in his brother; Obesity in his sister.  No Known Allergies     PHYSICAL EXAMINATION: Vital signs: BP 128/70   Pulse 77   Ht 5\' 5"  (1.651 m)   BMI 27.83 kg/m   Constitutional: Thin, frail, elderly, in wheelchair, no acute distress Psychiatric: alert and oriented x3, cooperative Eyes: extraocular  movements intact, anicteric, conjunctiva pink Mouth: oral pharynx moist, no lesions Neck: supple no lymphadenopathy Cardiovascular: heart regular rate and rhythm, no murmur Lungs: clear to auscultation bilaterally Abdomen: soft, nontender, nondistended, no obvious ascites, no peritoneal signs, normal bowel sounds, no organomegaly Rectal: Omitted Extremities: no clubbing or cyanosis.  1+ lower extremity edema bilaterally with stasis changes Skin: no lesions on visible extremities Neuro: Hard of hearing.  No focal deficits.  Cranial nerves intact  ASSESSMENT:  1.  Chronic constipation 2.  History of adenomatous colon polyps 3.  Multiple medical problems and advanced age   PLAN:  45.  First we discussed that there is no  correct number of bowel movements per day or week that a individual should have.  Interventions to alter bowel frequency should occur only if the patient is experiencing symptoms related to some element of bowel irregularity.  Having said that, we discussed options to increase the frequency of his bowel movements.  First, he could increase his Linzess from 145 mcg daily to 290 mcg daily.  We also discussed using his stool softeners daily (1 or 2).  We discussed the introduction of MiraLAX in the proper way to titrate MiraLAX.  We also discussed fiber as a bulking agent.  If he is concerned about incontinence (which he has had on one occasion only) then protective undergarments would be indicated. 2.  Resume general medical care with PCP

## 2018-01-31 NOTE — Patient Instructions (Signed)
You may increase your Linzess 172mcg to twice a day.  You may increase your stool softener and your miralax  You may use Metamucil daily

## 2018-02-25 ENCOUNTER — Other Ambulatory Visit: Payer: Self-pay

## 2018-03-16 ENCOUNTER — Ambulatory Visit (INDEPENDENT_AMBULATORY_CARE_PROVIDER_SITE_OTHER): Payer: Medicare Other

## 2018-03-16 DIAGNOSIS — I428 Other cardiomyopathies: Secondary | ICD-10-CM | POA: Diagnosis not present

## 2018-03-16 NOTE — Progress Notes (Signed)
Remote ICD transmission.   

## 2018-03-22 ENCOUNTER — Encounter: Payer: Self-pay | Admitting: Cardiology

## 2018-03-30 ENCOUNTER — Telehealth: Payer: Self-pay | Admitting: Family Medicine

## 2018-03-30 DIAGNOSIS — E538 Deficiency of other specified B group vitamins: Secondary | ICD-10-CM

## 2018-03-30 DIAGNOSIS — E559 Vitamin D deficiency, unspecified: Secondary | ICD-10-CM

## 2018-03-30 DIAGNOSIS — D508 Other iron deficiency anemias: Secondary | ICD-10-CM

## 2018-03-30 DIAGNOSIS — E785 Hyperlipidemia, unspecified: Secondary | ICD-10-CM

## 2018-03-30 DIAGNOSIS — E1140 Type 2 diabetes mellitus with diabetic neuropathy, unspecified: Secondary | ICD-10-CM

## 2018-03-30 DIAGNOSIS — M109 Gout, unspecified: Secondary | ICD-10-CM

## 2018-03-30 NOTE — Addendum Note (Signed)
Addended by: Carter Kitten on: 03/30/2018 02:23 PM   Modules accepted: Orders

## 2018-03-30 NOTE — Telephone Encounter (Signed)
-----   Message from Eustace Pen, LPN sent at 14/60/4799  4:07 PM EST ----- Regarding: labs 12/19 Lab orders needed. Thank you.  Insurance:  [employer]

## 2018-03-31 ENCOUNTER — Ambulatory Visit: Payer: Medicare Other

## 2018-04-04 ENCOUNTER — Other Ambulatory Visit: Payer: Self-pay | Admitting: Family Medicine

## 2018-04-05 ENCOUNTER — Encounter: Payer: Medicare Other | Admitting: Family Medicine

## 2018-04-19 ENCOUNTER — Other Ambulatory Visit: Payer: Self-pay | Admitting: Family Medicine

## 2018-04-21 ENCOUNTER — Telehealth: Payer: Self-pay | Admitting: Family Medicine

## 2018-04-21 NOTE — Telephone Encounter (Signed)
Left message asking pt to call office     Carter Kitten, CMA  Ramond Craver B        Please schedule Mr. Wilkerson for his Medicare Wellness with Dr. Diona Browner.   Thanks,  Butch Penny

## 2018-04-29 NOTE — Telephone Encounter (Signed)
Left message asking pt to call

## 2018-05-01 LAB — CUP PACEART REMOTE DEVICE CHECK
Battery Remaining Longevity: 14 mo
Battery Voltage: 2.88 V
Brady Statistic AP VP Percent: 0 %
Brady Statistic AS VP Percent: 0 %
Brady Statistic RA Percent Paced: 0 %
Brady Statistic RV Percent Paced: 96.27 %
Date Time Interrogation Session: 20191204072703
HighPow Impedance: 44 Ohm
HighPow Impedance: 56 Ohm
Implantable Lead Implant Date: 20080513
Implantable Lead Implant Date: 20080513
Implantable Lead Implant Date: 20080513
Implantable Lead Location: 753858
Implantable Lead Location: 753859
Implantable Lead Model: 4193
Implantable Lead Model: 6947
Implantable Pulse Generator Implant Date: 20141029
Lead Channel Impedance Value: 4047 Ohm
Lead Channel Impedance Value: 4047 Ohm
Lead Channel Impedance Value: 513 Ohm
Lead Channel Impedance Value: 513 Ohm
Lead Channel Pacing Threshold Amplitude: 0.75 V
Lead Channel Pacing Threshold Amplitude: 2.125 V
Lead Channel Pacing Threshold Pulse Width: 0.4 ms
Lead Channel Pacing Threshold Pulse Width: 0.8 ms
Lead Channel Sensing Intrinsic Amplitude: 0.375 mV
Lead Channel Sensing Intrinsic Amplitude: 0.5 mV
Lead Channel Sensing Intrinsic Amplitude: 6.625 mV
Lead Channel Sensing Intrinsic Amplitude: 6.625 mV
Lead Channel Setting Pacing Amplitude: 2.5 V
Lead Channel Setting Pacing Amplitude: 2.5 V
Lead Channel Setting Pacing Pulse Width: 0.4 ms
Lead Channel Setting Pacing Pulse Width: 0.8 ms
Lead Channel Setting Sensing Sensitivity: 0.3 mV
MDC IDC LEAD LOCATION: 753860
MDC IDC MSMT LEADCHNL RA IMPEDANCE VALUE: 456 Ohm
MDC IDC MSMT LEADCHNL RV IMPEDANCE VALUE: 532 Ohm
MDC IDC STAT BRADY AP VS PERCENT: 0 %
MDC IDC STAT BRADY AS VS PERCENT: 0 %

## 2018-05-09 NOTE — Telephone Encounter (Signed)
Spoke with [name] (daughter) she stated she would check with pt and call back to schedule appointment

## 2018-05-17 ENCOUNTER — Other Ambulatory Visit: Payer: Self-pay | Admitting: *Deleted

## 2018-05-17 MED ORDER — ATORVASTATIN CALCIUM 20 MG PO TABS: 20.0000 mg | ORAL_TABLET | Freq: Every day | ORAL | 1 refills | Status: DC

## 2018-06-02 ENCOUNTER — Encounter: Payer: Self-pay | Admitting: Internal Medicine

## 2018-06-15 ENCOUNTER — Ambulatory Visit (INDEPENDENT_AMBULATORY_CARE_PROVIDER_SITE_OTHER): Payer: Medicare Other | Admitting: *Deleted

## 2018-06-15 DIAGNOSIS — I428 Other cardiomyopathies: Secondary | ICD-10-CM

## 2018-06-15 DIAGNOSIS — I5032 Chronic diastolic (congestive) heart failure: Secondary | ICD-10-CM

## 2018-06-16 LAB — CUP PACEART REMOTE DEVICE CHECK
Battery Remaining Longevity: 11 mo
Battery Voltage: 2.87 V
Brady Statistic AP VP Percent: 0 %
Brady Statistic AP VS Percent: 0 %
Brady Statistic AS VP Percent: 0 %
Brady Statistic RA Percent Paced: 0 %
Brady Statistic RV Percent Paced: 96.93 %
Date Time Interrogation Session: 20200304093623
HighPow Impedance: 43 Ohm
HighPow Impedance: 52 Ohm
Implantable Lead Implant Date: 20080513
Implantable Lead Implant Date: 20080513
Implantable Lead Implant Date: 20080513
Implantable Lead Location: 753858
Implantable Lead Location: 753859
Implantable Lead Location: 753860
Implantable Lead Model: 4193
Implantable Lead Model: 5076
Implantable Lead Model: 6947
Implantable Pulse Generator Implant Date: 20141029
Lead Channel Impedance Value: 4047 Ohm
Lead Channel Impedance Value: 4047 Ohm
Lead Channel Impedance Value: 456 Ohm
Lead Channel Impedance Value: 532 Ohm
Lead Channel Impedance Value: 627 Ohm
Lead Channel Pacing Threshold Amplitude: 0.625 V
Lead Channel Pacing Threshold Pulse Width: 0.4 ms
Lead Channel Sensing Intrinsic Amplitude: 0.375 mV
Lead Channel Sensing Intrinsic Amplitude: 0.5 mV
Lead Channel Sensing Intrinsic Amplitude: 9.5 mV
Lead Channel Setting Pacing Amplitude: 2.5 V
Lead Channel Setting Pacing Amplitude: 2.5 V
Lead Channel Setting Pacing Pulse Width: 0.4 ms
Lead Channel Setting Pacing Pulse Width: 0.8 ms
Lead Channel Setting Sensing Sensitivity: 0.3 mV
MDC IDC MSMT LEADCHNL LV PACING THRESHOLD AMPLITUDE: 2.125 V
MDC IDC MSMT LEADCHNL LV PACING THRESHOLD PULSEWIDTH: 0.8 ms
MDC IDC MSMT LEADCHNL RV IMPEDANCE VALUE: 494 Ohm
MDC IDC MSMT LEADCHNL RV SENSING INTR AMPL: 9.5 mV
MDC IDC STAT BRADY AS VS PERCENT: 0 %

## 2018-06-23 NOTE — Progress Notes (Signed)
Remote ICD transmission.   

## 2018-06-30 ENCOUNTER — Telehealth: Payer: Self-pay

## 2018-06-30 NOTE — Telephone Encounter (Signed)
Spoke with pt's daughter who recently called to reschedule pt's appt. She states pt is feeling well. Denies SOB, edema, CP, palpitations. She was informed pt's CRT is approaching ERI. If pt hears alert tone, he is to send in a manual transmission and call the office. Pt does not need any refills at this time. Pt's daughter will call the office with any needs in the meantime.

## 2018-07-05 ENCOUNTER — Encounter: Payer: Medicare Other | Admitting: Internal Medicine

## 2018-07-06 ENCOUNTER — Ambulatory Visit: Payer: Medicare Other

## 2018-07-06 ENCOUNTER — Other Ambulatory Visit: Payer: Medicare Other

## 2018-07-06 ENCOUNTER — Encounter: Payer: Medicare Other | Admitting: Internal Medicine

## 2018-07-12 ENCOUNTER — Telehealth: Payer: Self-pay

## 2018-07-12 NOTE — Telephone Encounter (Signed)
I called and spoke with patients daughter [name], patient does not have internet access so they declined video visit. I have entered 3 month recall. [name] states that patient is doing well and does not need any refills at this time. She states that if things change she will call with an update.

## 2018-07-14 ENCOUNTER — Encounter: Payer: Medicare Other | Admitting: Family Medicine

## 2018-07-27 ENCOUNTER — Other Ambulatory Visit (HOSPITAL_COMMUNITY): Payer: Self-pay | Admitting: Internal Medicine

## 2018-07-28 ENCOUNTER — Telehealth: Payer: Self-pay | Admitting: Family Medicine

## 2018-07-28 MED ORDER — DUTASTERIDE 0.5 MG PO CAPS: 0.5000 mg | ORAL_CAPSULE | Freq: Every day | ORAL | 0 refills | Status: DC

## 2018-07-28 NOTE — Telephone Encounter (Addendum)
Refill sent as requested.  I have sent Shirlean Mylar a message asking her to try and schedule MWV and follow up with Dr. Diona Browner.

## 2018-07-28 NOTE — Telephone Encounter (Signed)
Best number [phone] [name] (daughter) Pt changed drugstore and needs [facility] [phone]  dutasteride cap 0.5mg 

## 2018-08-01 ENCOUNTER — Encounter: Payer: Medicare Other | Admitting: Internal Medicine

## 2018-09-01 ENCOUNTER — Ambulatory Visit (INDEPENDENT_AMBULATORY_CARE_PROVIDER_SITE_OTHER): Payer: Medicare Other | Admitting: Family Medicine

## 2018-09-01 ENCOUNTER — Encounter: Payer: Self-pay | Admitting: Family Medicine

## 2018-09-01 DIAGNOSIS — D508 Other iron deficiency anemias: Secondary | ICD-10-CM

## 2018-09-01 DIAGNOSIS — E538 Deficiency of other specified B group vitamins: Secondary | ICD-10-CM

## 2018-09-01 DIAGNOSIS — E785 Hyperlipidemia, unspecified: Secondary | ICD-10-CM | POA: Diagnosis not present

## 2018-09-01 DIAGNOSIS — E559 Vitamin D deficiency, unspecified: Secondary | ICD-10-CM

## 2018-09-01 DIAGNOSIS — E1140 Type 2 diabetes mellitus with diabetic neuropathy, unspecified: Secondary | ICD-10-CM | POA: Diagnosis not present

## 2018-09-01 DIAGNOSIS — M109 Gout, unspecified: Secondary | ICD-10-CM

## 2018-09-01 DIAGNOSIS — I10 Essential (primary) hypertension: Secondary | ICD-10-CM | POA: Diagnosis not present

## 2018-09-01 MED ORDER — LINACLOTIDE 290 MCG PO CAPS: 290.0000 ug | ORAL_CAPSULE | Freq: Every day | ORAL | 3 refills | Status: AC

## 2018-09-01 MED ORDER — PREDNISONE 20 MG PO TABS: ORAL_TABLET | ORAL | 0 refills | Status: DC

## 2018-09-01 MED ORDER — BLOOD GLUCOSE MONITOR KIT: PACK | 0 refills | Status: DC

## 2018-09-01 MED ORDER — BLOOD GLUCOSE METER KIT: PACK | 0 refills | Status: AC

## 2018-09-01 NOTE — Assessment & Plan Note (Signed)
Treat with prednisone taper. Get glucometer to check sugar on steroid.  Due for re-eval of chronic health issue.. wil come in for follow up appt and labs.

## 2018-09-01 NOTE — Progress Notes (Signed)
Order for glucose meter, test strips and lancets faxed to [facility] [phone].

## 2018-09-01 NOTE — Progress Notes (Signed)
VIRTUAL VISIT Due to national recommendations of social distancing due to [place] 19, a virtual visit is felt to be most appropriate for this patient at this time.   I connected with the patient on 09/01/18 at  2:30 PM EDT by virtual telehealth platform and verified that I am speaking with the correct person using two identifiers.   I discussed the limitations, risks, security and privacy concerns of performing an evaluation and management service by  virtual telehealth platform and the availability of in person appointments. I also discussed with the patient that there may be a patient responsible charge related to this service. The patient expressed understanding and agreed to proceed.  Patient location: Home Provider Location: [facility] Participants: Eliezer Lofts and Konrad Penta   Chief Complaint  Patient presents with  . Gout     pain in left foot    History of Present Illness: 85 year old male with history of gout on allopurinol .. noted pain left great toe x 2 days. Noted redness , swelling, exquisitely tender to touch.  Pain with movement.   Also some pain in left knee, swollen... no redness and heat.  DM Not checking lately.  Due for re-eval. Lab Results  Component Value Date   HGBA1C 7.2 (H) 09/27/2017     COVID 19 screen No recent travel or known exposure to [place] The patient denies respiratory symptoms of COVID 19 at this time.  The importance of social distancing was discussed today.   Review of Systems  Constitutional: Negative for chills and fever.  HENT: Negative for congestion and ear pain.   Eyes: Negative for pain and redness.  Respiratory: Negative for cough and shortness of breath.   Cardiovascular: Positive for leg swelling. Negative for chest pain and palpitations.  Gastrointestinal: Negative for abdominal pain, blood in stool, constipation, diarrhea, nausea and vomiting.  Genitourinary: Negative for dysuria.  Musculoskeletal: Positive  for joint pain. Negative for falls and myalgias.  Skin: Negative for rash.  Neurological: Negative for dizziness.  Psychiatric/Behavioral: Negative for depression. The patient is not nervous/anxious.       Past Medical History:  Diagnosis Date  . AICD (automatic cardioverter/defibrillator) present   . Anemia    iron deficiency anemia with previous severe GI bleed   . Arthritis    back and knees  . Atrial fibrillation ([place])    s/p AV node ablation; not on coumadin due to GIB  . 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 CAD (coronary artery disease)    Mild, nonobstructive (LHC 1/07: mLAD 20%, pCFX 20-30%, mRCA 30%, EF 25%)  . Cancer [facility])    prostate  . Cardiac arrest - ventricular fibrillation 02/2008   Aborted, shocked by ICD  . Cervical radiculopathy    left  . CHF (congestive heart failure) (HCC)    secondary to nonischemic cardiomyopathy; ECHO 10/07 EF 20-25%, mod to severe MR; ECHO 1/10 EF 55-60%, mild MR; ECHO 2/11 55-65%, grade 1 diast dysfxn, miod to mod LAE, mild TR;    S/P Medtronic BiV ICD with biventricular function now turned off due to diahragmatic simulation  . Chronic kidney disease   . Chronic neck pain   . COPD (chronic obstructive pulmonary disease) (HCC)    GOLD II; Spirometry 07/10/2008 >FEV1 1.46 56% predicted, ratio of 66%  . Dementia ([place])   . Diabetes mellitus    type 2 NIDDM x 5-6 yrs  . Dyspnea  exertional  . GERD (gastroesophageal reflux disease)   . Hyperlipidemia   . Hypertension   . Inguinal hernia    left; asymptomatic  . Left bundle branch block   . Medical history non-contributory   . Obesity   . Pneumonia    01/2011  . Presence of permanent cardiac pacemaker   . Prostatic hypertrophy 09-11-97   Benign  . Torticollis     reports that he has been smoking cigarettes. He has a 60.00 pack-year smoking history. He has never used smokeless tobacco. He reports current alcohol use. He reports that he does not use drugs.    Current Outpatient Medications:  .  acetaminophen (TYLENOL) 500 MG tablet, Take 500 mg by mouth every 6 (six) hours as needed for mild pain. , Disp: , Rfl:  .  albuterol (PROAIR HFA) 108 (90 Base) MCG/ACT inhaler, FOR DIRECTIONS ON HOW TO   TAKE THIS MEDICINE, READ   THE ENCLOSED MEDICATION    INFORMATION FORM, Disp: 25.5 g, Rfl: 3 .  allopurinol (ZYLOPRIM) 100 MG tablet, TAKE 1 TABLET DAILY, Disp: 90 tablet, Rfl: 0 .  aspirin EC 81 MG tablet, Take 81 mg by mouth 2 (two) times daily., Disp: , Rfl:  .  atorvastatin (LIPITOR) 20 MG tablet, Take 1 tablet (20 mg total) by mouth at bedtime., Disp: 90 tablet, Rfl: 1 .  carvedilol (COREG) 3.125 MG tablet, TAKE 1 TABLET TWICE A DAY  WITH MEALS, Disp: 180 tablet, Rfl: 3 .  Cholecalciferol (TH VITAMIN D3) 2000 UNITS CAPS, Take 2,000 Units by mouth daily. , Disp: , Rfl:  .  DEXILANT 60 MG capsule, TAKE 1 CAPSULE DAILY, Disp: 90 capsule, Rfl: 3 .  Docusate Calcium (STOOL SOFTENER PO), Take 100 tablets by mouth as needed., Disp: , Rfl:  .  dutasteride (AVODART) 0.5 MG capsule, Take 1 capsule (0.5 mg total) by mouth daily., Disp: 90 capsule, Rfl: 0 .  eplerenone (INSPRA) 25 MG tablet, Take 0.5 tablets (12.5 mg total) by mouth daily., Disp: 45 tablet, Rfl: 3 .  eplerenone (INSPRA) 25 MG tablet, Take 0.5 tablets (12.5 mg total) by mouth daily., Disp: 45 tablet, Rfl: 3 .  eplerenone (INSPRA) 25 MG tablet, TAKE 1/2 TABLET (12.5 MG TOTAL) DAILY, Disp: 45 tablet, Rfl: 0 .  furosemide (LASIX) 40 MG tablet, Take 1 tablet (40 mg total) by mouth daily., Disp: 90 tablet, Rfl: 3 .  guaiFENesin (MUCINEX) 600 MG 12 hr tablet, Take 1,200 mg by mouth 2 (two) times daily as needed for cough. , Disp: , Rfl:  .  linaclotide (LINZESS) 290 MCG CAPS capsule, Take 1 capsule (290 mcg total) by mouth daily before breakfast., Disp: 90 capsule, Rfl: 3 .  lisinopril (PRINIVIL,ZESTRIL) 2.5 MG tablet, Take 1 tablet (2.5 mg total) by mouth daily., Disp: 90 tablet, Rfl: 3 .  potassium  chloride SA (KLOR-CON M20) 20 MEQ tablet, Take 1 tablet (20 mEq total) by mouth 2 (two) times daily., Disp: 180 tablet, Rfl: 3 .  SPIRIVA HANDIHALER 18 MCG inhalation capsule, INHALE THE CONTENTS OF ONE CAPSULE DAILY IN THE       MORNING VIA HANDIHALER     DEVICE, Disp: 90 capsule, Rfl: 0 .  tamsulosin (FLOMAX) 0.4 MG CAPS capsule, TAKE 1 CAPSULE DAILY AFTER SUPPER, Disp: 90 capsule, Rfl: 0 .  donepezil (ARICEPT) 10 MG tablet, Take 1 tablet (10 mg total) by mouth at bedtime. (Patient not taking: Reported on 09/01/2018), Disp: 90 tablet, Rfl: 3 .  gabapentin (NEURONTIN) 300 MG capsule, Take  1 capsule (300 mg total) by mouth 2 (two) times daily., Disp: 180 capsule, Rfl: 3   Observations/Objective: There were no vitals taken for this visit.  Physical Exam  Physical Exam Constitutional:      General: The patient is not in acute distress. Pulmonary:     Effort: Pulmonary effort is normal. No respiratory distress.  Neurological:     Mental Status: The patient is alert and oriented to person, place, and time.  Psychiatric:        Mood and Affect: Mood normal.        Behavior: Behavior normal.  Left knee with deformity, no redness no swelling, left great toe MTP with redness pain, no ROM ( pt refuses to move given pain) and swelling Pt is in wheelchair and is no weight bearing.  Assessment and Plan Acute gouty arthritis  Treat with prednisone taper. Get glucometer to check sugar on steroid.  Due for re-eval of chronic health issue.. wil come in for follow up appt and labs.     I discussed the assessment and treatment plan with the patient. The patient was provided an opportunity to ask questions and all were answered. The patient agreed with the plan and demonstrated an understanding of the instructions.   The patient was advised to call back or seek an in-person evaluation if the symptoms worsen or if the condition fails to improve as anticipated.     Eliezer Lofts, MD

## 2018-09-01 NOTE — Addendum Note (Signed)
Addended by: Carter Kitten on: 09/01/2018 03:31 PM   Modules accepted: Orders

## 2018-09-06 NOTE — Progress Notes (Signed)
Labs and nurse visit for bp  6/2 Follow up with dr Diona Browner 6/5 daugher [name] is aware

## 2018-09-13 ENCOUNTER — Other Ambulatory Visit: Payer: Self-pay

## 2018-09-13 ENCOUNTER — Ambulatory Visit: Payer: Medicare Other

## 2018-09-13 ENCOUNTER — Other Ambulatory Visit (INDEPENDENT_AMBULATORY_CARE_PROVIDER_SITE_OTHER): Payer: Medicare Other

## 2018-09-13 DIAGNOSIS — M109 Gout, unspecified: Secondary | ICD-10-CM

## 2018-09-13 DIAGNOSIS — E559 Vitamin D deficiency, unspecified: Secondary | ICD-10-CM

## 2018-09-13 DIAGNOSIS — E785 Hyperlipidemia, unspecified: Secondary | ICD-10-CM

## 2018-09-13 DIAGNOSIS — E1140 Type 2 diabetes mellitus with diabetic neuropathy, unspecified: Secondary | ICD-10-CM

## 2018-09-13 DIAGNOSIS — E538 Deficiency of other specified B group vitamins: Secondary | ICD-10-CM | POA: Diagnosis not present

## 2018-09-13 DIAGNOSIS — D508 Other iron deficiency anemias: Secondary | ICD-10-CM

## 2018-09-13 LAB — CBC WITH DIFFERENTIAL/PLATELET
Basophils Absolute: 0 10*3/uL (ref 0.0–0.1)
Basophils Relative: 0.4 % (ref 0.0–3.0)
Eosinophils Absolute: 0.1 10*3/uL (ref 0.0–0.7)
Eosinophils Relative: 0.7 % (ref 0.0–5.0)
HCT: 31.9 % — ABNORMAL LOW (ref 39.0–52.0)
Hemoglobin: 10.7 g/dL — ABNORMAL LOW (ref 13.0–17.0)
Lymphocytes Relative: 36.2 % (ref 12.0–46.0)
Lymphs Abs: 5.1 10*3/uL — ABNORMAL HIGH (ref 0.7–4.0)
MCHC: 33.4 g/dL (ref 30.0–36.0)
MCV: 93.2 fl (ref 78.0–100.0)
Monocytes Absolute: 1.5 10*3/uL — ABNORMAL HIGH (ref 0.1–1.0)
Monocytes Relative: 11.1 % (ref 3.0–12.0)
Neutro Abs: 7.2 10*3/uL (ref 1.4–7.7)
Neutrophils Relative %: 51.6 % (ref 43.0–77.0)
Platelets: 169 10*3/uL (ref 150.0–400.0)
RBC: 3.42 Mil/uL — ABNORMAL LOW (ref 4.22–5.81)
RDW: 15.9 % — ABNORMAL HIGH (ref 11.5–15.5)
WBC: 14 10*3/uL — ABNORMAL HIGH (ref 4.0–10.5)

## 2018-09-13 LAB — COMPREHENSIVE METABOLIC PANEL
ALT: 24 U/L (ref 0–53)
AST: 16 U/L (ref 0–37)
Albumin: 3.6 g/dL (ref 3.5–5.2)
Alkaline Phosphatase: 89 U/L (ref 39–117)
BUN: 18 mg/dL (ref 6–23)
CO2: 28 mEq/L (ref 19–32)
Calcium: 8.9 mg/dL (ref 8.4–10.5)
Chloride: 102 mEq/L (ref 96–112)
Creatinine, Ser: 1.06 mg/dL (ref 0.40–1.50)
GFR: 66.27 mL/min (ref >60.00)
Glucose, Bld: 101 mg/dL — ABNORMAL HIGH (ref 70–99)
Potassium: 4.6 mEq/L (ref 3.5–5.1)
Sodium: 137 mEq/L (ref 135–145)
Total Bilirubin: 0.5 mg/dL (ref 0.2–1.2)
Total Protein: 5.7 g/dL — ABNORMAL LOW (ref 6.0–8.3)

## 2018-09-13 LAB — LIPID PANEL
Cholesterol: 95 mg/dL (ref 0–200)
HDL: 40 mg/dL (ref >39.00)
LDL Cholesterol: 37 mg/dL (ref 0–99)
NonHDL: 54.69
Total CHOL/HDL Ratio: 2
Triglycerides: 89 mg/dL (ref 0.0–149.0)
VLDL: 17.8 mg/dL (ref 0.0–40.0)

## 2018-09-13 LAB — VITAMIN D 25 HYDROXY (VIT D DEFICIENCY, FRACTURES): VITD: 58.54 ng/mL (ref 30.00–100.00)

## 2018-09-13 LAB — URIC ACID: Uric Acid, Serum: 5.3 mg/dL (ref 4.0–7.8)

## 2018-09-13 LAB — VITAMIN B12: Vitamin B-12: 172 pg/mL — ABNORMAL LOW (ref 211–911)

## 2018-09-13 LAB — HEMOGLOBIN A1C: Hgb A1c MFr Bld: 6.8 % — ABNORMAL HIGH (ref 4.6–6.5)

## 2018-09-14 ENCOUNTER — Ambulatory Visit (INDEPENDENT_AMBULATORY_CARE_PROVIDER_SITE_OTHER): Payer: Medicare Other | Admitting: *Deleted

## 2018-09-14 ENCOUNTER — Telehealth: Payer: Self-pay | Admitting: Family Medicine

## 2018-09-14 DIAGNOSIS — I428 Other cardiomyopathies: Secondary | ICD-10-CM

## 2018-09-14 LAB — CUP PACEART REMOTE DEVICE CHECK
Battery Remaining Longevity: 7 mo
Battery Voltage: 2.84 V
Brady Statistic AP VP Percent: 0 %
Brady Statistic AP VS Percent: 0 %
Brady Statistic AS VP Percent: 0 %
Brady Statistic AS VS Percent: 0 %
Brady Statistic RA Percent Paced: 0 %
Brady Statistic RV Percent Paced: 96.65 %
Date Time Interrogation Session: 20200603132429
HighPow Impedance: 44 Ohm
HighPow Impedance: 55 Ohm
Implantable Lead Implant Date: 20080513
Implantable Lead Implant Date: 20080513
Implantable Lead Implant Date: 20080513
Implantable Lead Location: 753858
Implantable Lead Location: 753859
Implantable Lead Location: 753860
Implantable Lead Model: 4193
Implantable Lead Model: 5076
Implantable Lead Model: 6947
Implantable Pulse Generator Implant Date: 20141029
Lead Channel Impedance Value: 4047 Ohm
Lead Channel Impedance Value: 4047 Ohm
Lead Channel Impedance Value: 456 Ohm
Lead Channel Impedance Value: 532 Ohm
Lead Channel Impedance Value: 532 Ohm
Lead Channel Impedance Value: 570 Ohm
Lead Channel Pacing Threshold Amplitude: 0.5 V
Lead Channel Pacing Threshold Amplitude: 2.125 V
Lead Channel Pacing Threshold Pulse Width: 0.4 ms
Lead Channel Pacing Threshold Pulse Width: 0.8 ms
Lead Channel Sensing Intrinsic Amplitude: 0.375 mV
Lead Channel Sensing Intrinsic Amplitude: 0.5 mV
Lead Channel Sensing Intrinsic Amplitude: 9.5 mV
Lead Channel Sensing Intrinsic Amplitude: 9.5 mV
Lead Channel Setting Pacing Amplitude: 2.5 V
Lead Channel Setting Pacing Amplitude: 2.5 V
Lead Channel Setting Pacing Pulse Width: 0.4 ms
Lead Channel Setting Pacing Pulse Width: 0.8 ms
Lead Channel Setting Sensing Sensitivity: 0.3 mV

## 2018-09-14 NOTE — Telephone Encounter (Signed)
Patient's granddaughter, Mel Almond, called in requesting to speak with Dr. Diona Browner or her CMA prior to patient's appointment Friday. She is concerned about patient driving. States he got lost Saturday when he was driving in [place]. She is requesting he have a mental exam performed during visit. She also states she has a form from the [facility] to get signed stating patient has dementia. I did not see her on DPR. She is requesting a call at [phone].

## 2018-09-15 ENCOUNTER — Ambulatory Visit (INDEPENDENT_AMBULATORY_CARE_PROVIDER_SITE_OTHER): Payer: Medicare Other

## 2018-09-15 VITALS — BP 138/64 | HR 73 | Temp 98.0°F | Resp 16

## 2018-09-15 DIAGNOSIS — I10 Essential (primary) hypertension: Secondary | ICD-10-CM

## 2018-09-15 NOTE — Progress Notes (Signed)
Per Dr. Diona Browner, patient is to have bp/vital sign check today for virtual office visit encounter on 09/16/18.  Dr. Diona Browner is out of the office today, will have Dr. Lorelei Pont sign off on vital check visit today.   Vital Sign Readings today are 138/64, 73 HR, 98.0 temp, Resp 16 and o2 Sat 95 % on Room air.  Patient is to continue current medication regimen and follow up with PCP tomorrow for further instructions at virtual visit.

## 2018-09-16 ENCOUNTER — Telehealth: Payer: Self-pay | Admitting: Family Medicine

## 2018-09-16 ENCOUNTER — Encounter: Payer: Self-pay | Admitting: Family Medicine

## 2018-09-16 ENCOUNTER — Ambulatory Visit (INDEPENDENT_AMBULATORY_CARE_PROVIDER_SITE_OTHER): Payer: Medicare Other | Admitting: Family Medicine

## 2018-09-16 VITALS — Ht 64.5 in

## 2018-09-16 DIAGNOSIS — D508 Other iron deficiency anemias: Secondary | ICD-10-CM | POA: Diagnosis not present

## 2018-09-16 DIAGNOSIS — E785 Hyperlipidemia, unspecified: Secondary | ICD-10-CM

## 2018-09-16 DIAGNOSIS — I10 Essential (primary) hypertension: Secondary | ICD-10-CM | POA: Diagnosis not present

## 2018-09-16 DIAGNOSIS — E559 Vitamin D deficiency, unspecified: Secondary | ICD-10-CM

## 2018-09-16 DIAGNOSIS — E1140 Type 2 diabetes mellitus with diabetic neuropathy, unspecified: Secondary | ICD-10-CM

## 2018-09-16 DIAGNOSIS — E538 Deficiency of other specified B group vitamins: Secondary | ICD-10-CM

## 2018-09-16 DIAGNOSIS — M109 Gout, unspecified: Secondary | ICD-10-CM

## 2018-09-16 DIAGNOSIS — F0390 Unspecified dementia without behavioral disturbance: Secondary | ICD-10-CM

## 2018-09-16 DIAGNOSIS — F03B0 Unspecified dementia, moderate, without behavioral disturbance, psychotic disturbance, mood disturbance, and anxiety: Secondary | ICD-10-CM

## 2018-09-16 NOTE — Telephone Encounter (Signed)
Best number [phone] Mel Almond (grandaughter)  Is concerned with mr karnes driving

## 2018-09-16 NOTE — Assessment & Plan Note (Signed)
Unable to do MMSE given poor connection. Pt is taking the aricept per daughter.  Pt and daughter told it is unsafe to have him drive not only from a memory issue but also from a leg pain and strength issue. If he is not strong enough to walk and not use hoverround... he would have slow reaction time.  Discussed ways to keep him from driving... DMV notification for test recall, take keys, battery etc.

## 2018-09-16 NOTE — Assessment & Plan Note (Signed)
Well controlled. Continue current medication.  

## 2018-09-16 NOTE — Assessment & Plan Note (Signed)
Diet controlled.  

## 2018-09-16 NOTE — Progress Notes (Signed)
AVS mailed to patient as instructed by Dr. Bedsole. 

## 2018-09-16 NOTE — Progress Notes (Signed)
VIRTUAL VISIT Due to national recommendations of social distancing due to [place] 19, a virtual visit is felt to be most appropriate for this patient at this time.   I connected with the patient on 09/16/18 at  8:20 AM EDT by virtual telehealth platform and verified that I am speaking with the correct person using two identifiers.   I discussed the limitations, risks, security and privacy concerns of performing an evaluation and management service by  virtual telehealth platform and the availability of in person appointments. I also discussed with the patient that there may be a patient responsible charge related to this service. The patient expressed understanding and agreed to proceed.  Patient location: Home Provider Location: [facility] Participants: Eliezer Lofts and Konrad Penta   Chief Complaint  Patient presents with  . Diabetes    History of Present Illness: 85 year old male presents for follow up diabetes.  Diabetes:  Good control with diet. Lab Results  Component Value Date   HGBA1C 6.8 (H) 09/13/2018  Using medications without difficulties: Hypoglycemic episodes: Hyperglycemic episodes: Feet problems:gout.. improved.. no further pain in foot Blood Sugars averaging: eye exam within last year: OVERDUE  Hypertension:  Good control on current regimen.  BP Readings from Last 3 Encounters:  09/15/18 138/64  01/31/18 128/70  12/16/17 116/62  Using medication without problems or lightheadedness: none Chest pain with exertion:none Edema:none Short of breath: stable Average home BPs: Other issues:  Elevated Cholesterol:LDL at goal < 70 on atorvastatin Using medications without problems: Muscle aches:  Diet compliance:moderate Exercise:none.. uses hoverround Other complaints:    Moderate dementia: He is on aricept 10 mg daily but has not been taking.  His niece had also left a message that she is concerned about him.Marland Kitchen He continues to drive despite  memory issues. Got lost in car recently.   Connection very poor: difficult to do MMSE.. will repeat at next OV.  Daughter does not seem to be concerned about his driving. " He drives pretty good"  When pushed she states he hasn't let her take away his car keys.   COVID 19 screen No recent travel or known exposure to COVID19 The patient denies respiratory symptoms of COVID 19 at this time.  The importance of social distancing was discussed today.   Review of Systems  Constitutional: Negative for chills and fever.  HENT: Negative for congestion and ear pain.   Eyes: Negative for pain and redness.  Respiratory: Negative for cough and shortness of breath.   Cardiovascular: Negative for chest pain, palpitations and leg swelling.  Gastrointestinal: Negative for abdominal pain, blood in stool, constipation, diarrhea, nausea and vomiting.  Genitourinary: Negative for dysuria.  Musculoskeletal: Negative for falls and myalgias.  Skin: Negative for rash.  Neurological: Negative for dizziness.  Psychiatric/Behavioral: Negative for depression. The patient is not nervous/anxious.       Past Medical History:  Diagnosis Date  . AICD (automatic cardioverter/defibrillator) present   . Anemia    iron deficiency anemia with previous severe GI bleed   . Arthritis    back and knees  . Atrial fibrillation ([place])    s/p AV node ablation; not on coumadin due to GIB  . 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 CAD (coronary artery disease)    Mild, nonobstructive (LHC 1/07: mLAD 20%, pCFX 20-30%, mRCA 30%, EF 25%)  . Cancer [facility])    prostate  . Cardiac arrest - ventricular fibrillation 02/2008  Aborted, shocked by ICD  . Cervical radiculopathy    left  . CHF (congestive heart failure) (HCC)    secondary to nonischemic cardiomyopathy; ECHO 10/07 EF 20-25%, mod to severe MR; ECHO 1/10 EF 55-60%, mild MR; ECHO 2/11 55-65%, grade 1 diast dysfxn, miod to mod LAE, mild TR;    S/P  Medtronic BiV ICD with biventricular function now turned off due to diahragmatic simulation  . Chronic kidney disease   . Chronic neck pain   . COPD (chronic obstructive pulmonary disease) (HCC)    GOLD II; Spirometry 07/10/2008 >FEV1 1.46 56% predicted, ratio of 66%  . Dementia ([place])   . Diabetes mellitus    type 2 NIDDM x 5-6 yrs  . Dyspnea    exertional  . GERD (gastroesophageal reflux disease)   . Hyperlipidemia   . Hypertension   . Inguinal hernia    left; asymptomatic  . Left bundle branch block   . Medical history non-contributory   . Obesity   . Pneumonia    01/2011  . Presence of permanent cardiac pacemaker   . Prostatic hypertrophy 09-11-97   Benign  . Torticollis     reports that he has been smoking cigarettes. He has a 60.00 pack-year smoking history. He has never used smokeless tobacco. He reports current alcohol use. He reports that he does not use drugs.   Current Outpatient Medications:  .  acetaminophen (TYLENOL) 500 MG tablet, Take 500 mg by mouth every 6 (six) hours as needed for mild pain. , Disp: , Rfl:  .  albuterol (PROAIR HFA) 108 (90 Base) MCG/ACT inhaler, FOR DIRECTIONS ON HOW TO   TAKE THIS MEDICINE, READ   THE ENCLOSED MEDICATION    INFORMATION FORM, Disp: 25.5 g, Rfl: 3 .  allopurinol (ZYLOPRIM) 100 MG tablet, TAKE 1 TABLET DAILY, Disp: 90 tablet, Rfl: 0 .  aspirin EC 81 MG tablet, Take 81 mg by mouth 2 (two) times daily., Disp: , Rfl:  .  atorvastatin (LIPITOR) 20 MG tablet, Take 1 tablet (20 mg total) by mouth at bedtime., Disp: 90 tablet, Rfl: 1 .  blood glucose meter kit and supplies, Dispense based on patient and insurance preference. Use to check blood sugar daily., Disp: 1 each, Rfl: 0 .  carvedilol (COREG) 3.125 MG tablet, TAKE 1 TABLET TWICE A DAY  WITH MEALS, Disp: 180 tablet, Rfl: 3 .  Cholecalciferol (TH VITAMIN D3) 2000 UNITS CAPS, Take 2,000 Units by mouth daily. , Disp: , Rfl:  .  DEXILANT 60 MG capsule, TAKE 1 CAPSULE DAILY, Disp: 90  capsule, Rfl: 3 .  Docusate Calcium (STOOL SOFTENER PO), Take 100 tablets by mouth as needed., Disp: , Rfl:  .  donepezil (ARICEPT) 10 MG tablet, Take 1 tablet (10 mg total) by mouth at bedtime. (Patient not taking: Reported on 09/01/2018), Disp: 90 tablet, Rfl: 3 .  dutasteride (AVODART) 0.5 MG capsule, Take 1 capsule (0.5 mg total) by mouth daily., Disp: 90 capsule, Rfl: 0 .  eplerenone (INSPRA) 25 MG tablet, Take 0.5 tablets (12.5 mg total) by mouth daily., Disp: 45 tablet, Rfl: 3 .  furosemide (LASIX) 40 MG tablet, Take 1 tablet (40 mg total) by mouth daily., Disp: 90 tablet, Rfl: 3 .  gabapentin (NEURONTIN) 300 MG capsule, Take 1 capsule (300 mg total) by mouth 2 (two) times daily., Disp: 180 capsule, Rfl: 3 .  guaiFENesin (MUCINEX) 600 MG 12 hr tablet, Take 1,200 mg by mouth 2 (two) times daily as needed for cough. , Disp: ,  Rfl:  .  linaclotide (LINZESS) 290 MCG CAPS capsule, Take 1 capsule (290 mcg total) by mouth daily before breakfast., Disp: 90 capsule, Rfl: 3 .  lisinopril (PRINIVIL,ZESTRIL) 2.5 MG tablet, Take 1 tablet (2.5 mg total) by mouth daily., Disp: 90 tablet, Rfl: 3 .  potassium chloride SA (KLOR-CON M20) 20 MEQ tablet, Take 1 tablet (20 mEq total) by mouth 2 (two) times daily., Disp: 180 tablet, Rfl: 3 .  SPIRIVA HANDIHALER 18 MCG inhalation capsule, INHALE THE CONTENTS OF ONE CAPSULE DAILY IN THE       MORNING VIA HANDIHALER     DEVICE, Disp: 90 capsule, Rfl: 0 .  tamsulosin (FLOMAX) 0.4 MG CAPS capsule, TAKE 1 CAPSULE DAILY AFTER SUPPER, Disp: 90 capsule, Rfl: 0   Observations/Objective: Height 5' 4.5" (1.638 m).  Physical Exam  Physical Exam Constitutional:      General: The patient is not in acute distress. In hoverround.Marland Kitchen not walking Pulmonary:     Effort: Pulmonary effort is normal. No respiratory distress.  Neurological:     Mental Status: The patient is alert and oriented to person, place, and time.  Psychiatric:        Mood and Affect: Mood normal.         Behavior: Behavior normal.  Assessment and Plan   Type 2 diabetes mellitus with diabetic neuropathy (HCC) Diet controlled.  Moderate dementia without behavioral disturbance Unable to do MMSE given poor connection. Pt is taking the aricept per daughter.  Pt and daughter told it is unsafe to have him drive not only from a memory issue but also from a leg pain and strength issue. If he is not strong enough to walk and not use hoverround... he would have slow reaction time.  Discussed ways to keep him from driving... DMV notification for test recall, take keys, battery etc.   Essential hypertension, benign Well controlled. Continue current medication.   Acute gouty arthritis Uric acid at goal and flare resolved.  Vitamin D deficiency At goal.  B12 deficiency Replete   I discussed the assessment and treatment plan with the patient. The patient was provided an opportunity to ask questions and all were answered. The patient agreed with the plan and demonstrated an understanding of the instructions.   The patient was advised to call back or seek an in-person evaluation if the symptoms worsen or if the condition fails to improve as anticipated.     Eliezer Lofts, MD

## 2018-09-16 NOTE — Telephone Encounter (Signed)
Call and left a message to call back if needed. Pt can drop off form for signature

## 2018-09-16 NOTE — Patient Instructions (Addendum)
Set up yearly eye exam.  I recommend strongly to not allow [name] to drive.  He has had significant pain in legs, weakness and memory issue that will cause slow reaction time and increase his risk for an accident.  Make sure he is taking B12 1000 mg.

## 2018-09-16 NOTE — Assessment & Plan Note (Signed)
Uric acid at goal and flare resolved.

## 2018-09-16 NOTE — Assessment & Plan Note (Signed)
Replete

## 2018-09-16 NOTE — Assessment & Plan Note (Signed)
At goal.  

## 2018-09-20 NOTE — Telephone Encounter (Signed)
Spoke with [employer]  She will drop form by our office for Dr. Diona Browner to review.  She is concerned because on May 30 th [name] went driving around in [place] in his nightgown and got completely lost.  A nice citizen saw him and helped.  They called the police.   [name] had no idea how to get back home.  She states this is not the first time this has happened.  She feels [name] dementia and cognitive thinking has definitely gotten worse.  She also states [name] can't remember Bailey's name and they have always been close.  She states the form is basically for Dr. Diona Browner to confirm his dementia diagnosis.  She will drop form by the office on Wednesday.

## 2018-09-20 NOTE — Telephone Encounter (Signed)
Noted. Will complete. Pt has moderately severe dementia diagnosed few years ago.

## 2018-09-23 ENCOUNTER — Encounter: Payer: Self-pay | Admitting: Cardiology

## 2018-09-23 NOTE — Progress Notes (Signed)
Remote ICD transmission.   

## 2018-09-25 ENCOUNTER — Other Ambulatory Visit: Payer: Self-pay | Admitting: Family Medicine

## 2018-10-06 NOTE — Progress Notes (Signed)
Left message asking pt to call office 6/25/rbh

## 2018-10-07 ENCOUNTER — Other Ambulatory Visit: Payer: Self-pay | Admitting: Family Medicine

## 2018-10-11 ENCOUNTER — Telehealth: Payer: Self-pay

## 2018-10-11 NOTE — Telephone Encounter (Signed)
Spoke to pt daughter regarding ERI alert. Will schedule for monthly battery checks. Pt daughter does not have any questions at this time.

## 2018-10-12 NOTE — Progress Notes (Signed)
Left message asking [name] to call office

## 2018-10-20 NOTE — Progress Notes (Signed)
Spoke to daughter [name]  She will call back to schedule 7/9/rbh

## 2018-10-21 ENCOUNTER — Other Ambulatory Visit: Payer: Self-pay | Admitting: Family Medicine

## 2018-10-25 ENCOUNTER — Other Ambulatory Visit (HOSPITAL_COMMUNITY): Payer: Self-pay | Admitting: Internal Medicine

## 2018-10-25 ENCOUNTER — Other Ambulatory Visit: Payer: Self-pay | Admitting: Family Medicine

## 2018-11-10 ENCOUNTER — Ambulatory Visit (INDEPENDENT_AMBULATORY_CARE_PROVIDER_SITE_OTHER): Payer: Medicare Other | Admitting: *Deleted

## 2018-11-10 DIAGNOSIS — Z95810 Presence of automatic (implantable) cardiac defibrillator: Secondary | ICD-10-CM

## 2018-11-10 LAB — CUP PACEART REMOTE DEVICE CHECK
Battery Remaining Longevity: 5 mo
Battery Voltage: 2.82 V
Brady Statistic AP VP Percent: 0 %
Brady Statistic AP VS Percent: 0 %
Brady Statistic AS VP Percent: 0 %
Brady Statistic AS VS Percent: 0 %
Brady Statistic RA Percent Paced: 0 %
Brady Statistic RV Percent Paced: 93.98 %
Date Time Interrogation Session: 20200730124853
HighPow Impedance: 37 Ohm
HighPow Impedance: 44 Ohm
Implantable Lead Implant Date: 20080513
Implantable Lead Implant Date: 20080513
Implantable Lead Implant Date: 20080513
Implantable Lead Location: 753858
Implantable Lead Location: 753859
Implantable Lead Location: 753860
Implantable Lead Model: 4193
Implantable Lead Model: 5076
Implantable Lead Model: 6947
Implantable Pulse Generator Implant Date: 20141029
Lead Channel Impedance Value: 323 Ohm
Lead Channel Impedance Value: 4047 Ohm
Lead Channel Impedance Value: 4047 Ohm
Lead Channel Impedance Value: 437 Ohm
Lead Channel Impedance Value: 437 Ohm
Lead Channel Impedance Value: 456 Ohm
Lead Channel Pacing Threshold Amplitude: 0.5 V
Lead Channel Pacing Threshold Amplitude: 2.375 V
Lead Channel Pacing Threshold Pulse Width: 0.4 ms
Lead Channel Pacing Threshold Pulse Width: 0.8 ms
Lead Channel Sensing Intrinsic Amplitude: 6.125 mV
Lead Channel Setting Pacing Amplitude: 2.5 V
Lead Channel Setting Pacing Amplitude: 2.5 V
Lead Channel Setting Pacing Pulse Width: 0.4 ms
Lead Channel Setting Pacing Pulse Width: 0.8 ms
Lead Channel Setting Sensing Sensitivity: 0.3 mV

## 2018-11-11 ENCOUNTER — Telehealth: Payer: Self-pay

## 2018-11-11 NOTE — Telephone Encounter (Signed)
Alert received for increased optivol. Spoke with pt daughter, she states that the pt does not have swelling or SOB. Appears the thoracic impedance is trending back to baseline. Will route to Dr. Caryl Comes for review.

## 2018-11-15 ENCOUNTER — Other Ambulatory Visit: Payer: Self-pay | Admitting: Family Medicine

## 2018-11-16 NOTE — Telephone Encounter (Signed)
M  Please have him take 80 mg po qam x every other day for two doses, ( ie tomorrow 80, tomorrow +1 40, tomorrow +2 80 then resume 40  And then refer him to Margarita Grizzle short in [facility] clinic Thanks DK

## 2018-11-18 ENCOUNTER — Encounter: Payer: Self-pay | Admitting: Cardiology

## 2018-11-18 ENCOUNTER — Telehealth: Payer: Self-pay

## 2018-11-18 NOTE — Telephone Encounter (Signed)
Spoke w/ pt daughter, informed her of Dr. Olin Pia recommendations. Pt daughter verbalized understanding. No further questions at this time.

## 2018-11-18 NOTE — Telephone Encounter (Signed)
Patient referred to [facility] clinic by Dr Caryl Comes.  Attempted ICM intro call to daughter [name] and left message with number for return call.  Dr Caryl Comes increased Lasix to 80 mg every other day x 2 doses on 8/5 due to decreased Optivol impedance.  Will request follow up remote transmission when daughter is reached.

## 2018-11-18 NOTE — Progress Notes (Signed)
Remote ICD transmission.   

## 2018-11-21 NOTE — Telephone Encounter (Signed)
Patient's daughter [name] returned your call.

## 2018-11-21 NOTE — Telephone Encounter (Signed)
Call to daughter, [name], DRP.  Transmission reviewed and explained the fluid levels returned to normal since taking extra Lasix as instructed by Dr Caryl Comes.  She agreed to monthly ICM follow and next remote transmission will be 12/15/2018.  Provided ICM direct number and encouraged to call if patient experiences any fluid symptoms.    Optivol thoracic impedance returned to normal.    Copy to Dr Caryl Comes to show effectiveness of extra Lasix dosage.   3 month trend:    1 year trend

## 2018-11-21 NOTE — Telephone Encounter (Signed)
Returned call to daughter, [name], [place]. ICM intro given and explained reason for call.  She said patient is doing well and followed Dr Olin Pia instructions to take extra Lasix.  She said patient is asymptomatic and feeling fine.  Requested a remote transmission to be sent today for review and explained how to send the transmission.  Will review transmission when received.

## 2018-11-26 NOTE — Telephone Encounter (Signed)
Noted  

## 2018-11-28 ENCOUNTER — Other Ambulatory Visit: Payer: Self-pay | Admitting: *Deleted

## 2018-11-28 MED ORDER — ALLOPURINOL 100 MG PO TABS: 100.0000 mg | ORAL_TABLET | Freq: Every day | ORAL | 3 refills | Status: AC

## 2018-12-14 ENCOUNTER — Ambulatory Visit (INDEPENDENT_AMBULATORY_CARE_PROVIDER_SITE_OTHER): Payer: Medicare Other | Admitting: *Deleted

## 2018-12-14 DIAGNOSIS — I428 Other cardiomyopathies: Secondary | ICD-10-CM

## 2018-12-15 ENCOUNTER — Ambulatory Visit (INDEPENDENT_AMBULATORY_CARE_PROVIDER_SITE_OTHER): Payer: Medicare Other

## 2018-12-15 DIAGNOSIS — I5032 Chronic diastolic (congestive) heart failure: Secondary | ICD-10-CM

## 2018-12-15 DIAGNOSIS — Z95810 Presence of automatic (implantable) cardiac defibrillator: Secondary | ICD-10-CM | POA: Diagnosis not present

## 2018-12-15 LAB — CUP PACEART REMOTE DEVICE CHECK
Battery Remaining Longevity: 5 mo
Battery Voltage: 2.81 V
Brady Statistic AP VP Percent: 0 %
Brady Statistic AP VS Percent: 0 %
Brady Statistic AS VP Percent: 0 %
Brady Statistic AS VS Percent: 0 %
Brady Statistic RA Percent Paced: 0 %
Brady Statistic RV Percent Paced: 97.12 %
Date Time Interrogation Session: 20200902062825
HighPow Impedance: 42 Ohm
HighPow Impedance: 55 Ohm
Implantable Lead Implant Date: 20080513
Implantable Lead Implant Date: 20080513
Implantable Lead Implant Date: 20080513
Implantable Lead Location: 753858
Implantable Lead Location: 753859
Implantable Lead Location: 753860
Implantable Lead Model: 4193
Implantable Lead Model: 5076
Implantable Lead Model: 6947
Implantable Pulse Generator Implant Date: 20141029
Lead Channel Impedance Value: 4047 Ohm
Lead Channel Impedance Value: 4047 Ohm
Lead Channel Impedance Value: 456 Ohm
Lead Channel Impedance Value: 513 Ohm
Lead Channel Impedance Value: 532 Ohm
Lead Channel Impedance Value: 532 Ohm
Lead Channel Pacing Threshold Amplitude: 0.5 V
Lead Channel Pacing Threshold Amplitude: 2.375 V
Lead Channel Pacing Threshold Pulse Width: 0.4 ms
Lead Channel Pacing Threshold Pulse Width: 0.8 ms
Lead Channel Sensing Intrinsic Amplitude: 0.375 mV
Lead Channel Sensing Intrinsic Amplitude: 0.5 mV
Lead Channel Sensing Intrinsic Amplitude: 6.125 mV
Lead Channel Sensing Intrinsic Amplitude: 6.125 mV
Lead Channel Setting Pacing Amplitude: 2.5 V
Lead Channel Setting Pacing Amplitude: 2.5 V
Lead Channel Setting Pacing Pulse Width: 0.4 ms
Lead Channel Setting Pacing Pulse Width: 0.8 ms
Lead Channel Setting Sensing Sensitivity: 0.3 mV

## 2018-12-16 ENCOUNTER — Telehealth: Payer: Self-pay

## 2018-12-16 NOTE — Telephone Encounter (Signed)
Remote ICM transmission received.  Attempted call to daughter [name] regarding ICM remote transmission and left detailed message per DPR.  Advised to return call for any fluid symptoms or questions.

## 2018-12-16 NOTE — Progress Notes (Signed)
EPIC Encounter for ICM Monitoring  Patient Name: [name] is a 85 y.o. male Date: 12/16/2018 Primary Care Physican: Jinny Sanders, MD Primary Cardiologist: Bensimhon Electrophysiologist: Vergie Living Pacing:   97.1%  Weight:  unknown      1st remote transmission.  Attempted to call daughter [name].  Left detailed message per DPR.   Report: Thoracic impedance normal.   Prescribed: Furosemide 40 mg take 1 tablet daily.  Recommendations: Left voice mail with ICM number and encouraged to call if experiencing any fluid symptoms.  Follow-up plan: ICM clinic phone appointment on 01/30/2019.       Copy of ICM check sent to Dr. Caryl Comes.   3 month ICM trend: 12/14/2018    1 Year ICM trend:       Rosalene Billings, RN 12/16/2018 4:11 PM

## 2018-12-29 ENCOUNTER — Encounter: Payer: Self-pay | Admitting: Cardiology

## 2018-12-29 NOTE — Progress Notes (Signed)
Remote ICD transmission.   

## 2019-01-05 ENCOUNTER — Other Ambulatory Visit: Payer: Self-pay | Admitting: Family Medicine

## 2019-01-12 ENCOUNTER — Ambulatory Visit (INDEPENDENT_AMBULATORY_CARE_PROVIDER_SITE_OTHER): Payer: Medicare Other | Admitting: *Deleted

## 2019-01-12 DIAGNOSIS — I428 Other cardiomyopathies: Secondary | ICD-10-CM

## 2019-01-12 LAB — CUP PACEART REMOTE DEVICE CHECK
Battery Remaining Longevity: 5 mo
Battery Voltage: 2.8 V
Brady Statistic AP VP Percent: 0 %
Brady Statistic AP VS Percent: 0 %
Brady Statistic AS VP Percent: 0 %
Brady Statistic AS VS Percent: 0 %
Brady Statistic RA Percent Paced: 0 %
Brady Statistic RV Percent Paced: 97.2 %
Date Time Interrogation Session: 20201001073525
HighPow Impedance: 43 Ohm
HighPow Impedance: 50 Ohm
Implantable Lead Implant Date: 20080513
Implantable Lead Implant Date: 20080513
Implantable Lead Implant Date: 20080513
Implantable Lead Location: 753858
Implantable Lead Location: 753859
Implantable Lead Location: 753860
Implantable Lead Model: 4193
Implantable Lead Model: 5076
Implantable Lead Model: 6947
Implantable Pulse Generator Implant Date: 20141029
Lead Channel Impedance Value: 4047 Ohm
Lead Channel Impedance Value: 4047 Ohm
Lead Channel Impedance Value: 456 Ohm
Lead Channel Impedance Value: 532 Ohm
Lead Channel Impedance Value: 570 Ohm
Lead Channel Impedance Value: 570 Ohm
Lead Channel Pacing Threshold Amplitude: 0.5 V
Lead Channel Pacing Threshold Amplitude: 2.5 V
Lead Channel Pacing Threshold Pulse Width: 0.4 ms
Lead Channel Pacing Threshold Pulse Width: 0.8 ms
Lead Channel Sensing Intrinsic Amplitude: 0.375 mV
Lead Channel Sensing Intrinsic Amplitude: 6.125 mV
Lead Channel Setting Pacing Amplitude: 2.5 V
Lead Channel Setting Pacing Amplitude: 2.5 V
Lead Channel Setting Pacing Pulse Width: 0.4 ms
Lead Channel Setting Pacing Pulse Width: 0.8 ms
Lead Channel Setting Sensing Sensitivity: 0.3 mV

## 2019-01-19 ENCOUNTER — Encounter: Payer: Self-pay | Admitting: Cardiology

## 2019-01-19 NOTE — Progress Notes (Signed)
Remote ICD transmission.   

## 2019-01-23 ENCOUNTER — Other Ambulatory Visit: Payer: Self-pay | Admitting: Family Medicine

## 2019-01-23 ENCOUNTER — Other Ambulatory Visit (HOSPITAL_COMMUNITY): Payer: Self-pay | Admitting: Internal Medicine

## 2019-01-23 NOTE — Telephone Encounter (Signed)
Last office visit 09/16/2018.  AVS states to follow up in 3 months on dementia for memory test.  No future appointment other than flu shot clinic on 01/24/2019.  Last refilled 10/25/2018 for #90 with no refills.  Ok to refill?

## 2019-01-24 ENCOUNTER — Ambulatory Visit (INDEPENDENT_AMBULATORY_CARE_PROVIDER_SITE_OTHER): Payer: Medicare Other

## 2019-01-24 DIAGNOSIS — Z23 Encounter for immunization: Secondary | ICD-10-CM

## 2019-01-30 ENCOUNTER — Ambulatory Visit (INDEPENDENT_AMBULATORY_CARE_PROVIDER_SITE_OTHER): Payer: Medicare Other

## 2019-01-30 DIAGNOSIS — I5032 Chronic diastolic (congestive) heart failure: Secondary | ICD-10-CM | POA: Diagnosis not present

## 2019-01-30 DIAGNOSIS — Z95810 Presence of automatic (implantable) cardiac defibrillator: Secondary | ICD-10-CM

## 2019-02-01 NOTE — Progress Notes (Signed)
EPIC Encounter for ICM Monitoring  Patient Name: [name] is a 85 y.o. male Date: 02/01/2019 Primary Care Physican: Jinny Sanders, MD Primary Cardiologist: [place] Electrophysiologist: Vergie Living Pacing:   96.7%       Weight:  unknown  ERI estimated at 3 months                                                          Call daughter [name].  Patient is doing well and no fluid symptoms.  Advised battery is estimated at 3 months and will alert when it reaches time to exchange battery.  Explained after the alert there is still 3 months left on the battery.    Optivol thoracic impedance normal.   Prescribed: Furosemide 40 mg take 1 tablet daily.  Recommendations: No changes and encouraged to call if experiencing any fluid symptoms.  Follow-up plan: ICM clinic phone appointment on 03/16/2019.   91 day device clinic remote transmission 03/15/2019.    Copy of ICM check sent to Dr. Caryl Comes.   3 month ICM trend: 01/30/2019    1 Year ICM trend:       Rosalene Billings, RN 02/01/2019 1:33 PM

## 2019-02-13 ENCOUNTER — Ambulatory Visit (INDEPENDENT_AMBULATORY_CARE_PROVIDER_SITE_OTHER): Payer: Medicare Other | Admitting: *Deleted

## 2019-02-13 DIAGNOSIS — I472 Ventricular tachycardia: Secondary | ICD-10-CM

## 2019-02-13 DIAGNOSIS — I4729 Other ventricular tachycardia: Secondary | ICD-10-CM

## 2019-02-13 DIAGNOSIS — I5032 Chronic diastolic (congestive) heart failure: Secondary | ICD-10-CM

## 2019-02-13 LAB — CUP PACEART REMOTE DEVICE CHECK
Battery Remaining Longevity: 4 mo
Battery Voltage: 2.79 V
Brady Statistic AP VP Percent: 0 %
Brady Statistic AP VS Percent: 0 %
Brady Statistic AS VP Percent: 0 %
Brady Statistic AS VS Percent: 0 %
Brady Statistic RA Percent Paced: 0 %
Brady Statistic RV Percent Paced: 95.68 %
Date Time Interrogation Session: 20201102082323
HighPow Impedance: 41 Ohm
HighPow Impedance: 48 Ohm
Implantable Lead Implant Date: 20080513
Implantable Lead Implant Date: 20080513
Implantable Lead Implant Date: 20080513
Implantable Lead Location: 753858
Implantable Lead Location: 753859
Implantable Lead Location: 753860
Implantable Lead Model: 4193
Implantable Lead Model: 5076
Implantable Lead Model: 6947
Implantable Pulse Generator Implant Date: 20141029
Lead Channel Impedance Value: 342 Ohm
Lead Channel Impedance Value: 399 Ohm
Lead Channel Impedance Value: 4047 Ohm
Lead Channel Impedance Value: 4047 Ohm
Lead Channel Impedance Value: 456 Ohm
Lead Channel Impedance Value: 494 Ohm
Lead Channel Pacing Threshold Amplitude: 0.5 V
Lead Channel Pacing Threshold Amplitude: 1 V
Lead Channel Pacing Threshold Pulse Width: 0.4 ms
Lead Channel Pacing Threshold Pulse Width: 0.8 ms
Lead Channel Sensing Intrinsic Amplitude: 0.375 mV
Lead Channel Sensing Intrinsic Amplitude: 0.5 mV
Lead Channel Sensing Intrinsic Amplitude: 5.625 mV
Lead Channel Sensing Intrinsic Amplitude: 5.625 mV
Lead Channel Setting Pacing Amplitude: 2.5 V
Lead Channel Setting Pacing Amplitude: 2.5 V
Lead Channel Setting Pacing Pulse Width: 0.4 ms
Lead Channel Setting Pacing Pulse Width: 0.8 ms
Lead Channel Setting Sensing Sensitivity: 0.3 mV

## 2019-02-20 ENCOUNTER — Other Ambulatory Visit: Payer: Self-pay | Admitting: *Deleted

## 2019-02-20 NOTE — Telephone Encounter (Signed)
Last office visit 09/16/2018 for DM.  Last refilled 04/04/2018 for #90 with no refills.  AVS states to follow up in 3 month on dementia/memory test.  No future appointments scheduled with PCP.  Ok to refill?

## 2019-02-21 MED ORDER — TAMSULOSIN HCL 0.4 MG PO CAPS: ORAL_CAPSULE | ORAL | 0 refills | Status: AC

## 2019-03-08 ENCOUNTER — Other Ambulatory Visit: Payer: Self-pay

## 2019-03-08 NOTE — Progress Notes (Signed)
Remote pacemaker transmission.   

## 2019-03-15 ENCOUNTER — Ambulatory Visit (INDEPENDENT_AMBULATORY_CARE_PROVIDER_SITE_OTHER): Payer: Medicare Other | Admitting: *Deleted

## 2019-03-15 DIAGNOSIS — Z95810 Presence of automatic (implantable) cardiac defibrillator: Secondary | ICD-10-CM

## 2019-03-16 ENCOUNTER — Ambulatory Visit (INDEPENDENT_AMBULATORY_CARE_PROVIDER_SITE_OTHER): Payer: Medicare Other

## 2019-03-16 DIAGNOSIS — Z95810 Presence of automatic (implantable) cardiac defibrillator: Secondary | ICD-10-CM | POA: Diagnosis not present

## 2019-03-16 DIAGNOSIS — I5032 Chronic diastolic (congestive) heart failure: Secondary | ICD-10-CM

## 2019-03-16 LAB — CUP PACEART REMOTE DEVICE CHECK
Battery Remaining Longevity: 3 mo
Battery Voltage: 2.77 V
Brady Statistic AP VP Percent: 0 %
Brady Statistic AP VS Percent: 0 %
Brady Statistic AS VP Percent: 0 %
Brady Statistic AS VS Percent: 0 %
Brady Statistic RA Percent Paced: 0 %
Brady Statistic RV Percent Paced: 85.9 %
Date Time Interrogation Session: 20201203113936
HighPow Impedance: 42 Ohm
HighPow Impedance: 54 Ohm
Implantable Lead Implant Date: 20080513
Implantable Lead Implant Date: 20080513
Implantable Lead Implant Date: 20080513
Implantable Lead Location: 753858
Implantable Lead Location: 753859
Implantable Lead Location: 753860
Implantable Lead Model: 4193
Implantable Lead Model: 5076
Implantable Lead Model: 6947
Implantable Pulse Generator Implant Date: 20141029
Lead Channel Impedance Value: 4047 Ohm
Lead Channel Impedance Value: 4047 Ohm
Lead Channel Impedance Value: 456 Ohm
Lead Channel Impedance Value: 456 Ohm
Lead Channel Impedance Value: 456 Ohm
Lead Channel Impedance Value: 532 Ohm
Lead Channel Pacing Threshold Amplitude: 0.5 V
Lead Channel Pacing Threshold Amplitude: 1.5 V
Lead Channel Pacing Threshold Pulse Width: 0.4 ms
Lead Channel Pacing Threshold Pulse Width: 0.8 ms
Lead Channel Sensing Intrinsic Amplitude: 0.375 mV
Lead Channel Sensing Intrinsic Amplitude: 0.5 mV
Lead Channel Sensing Intrinsic Amplitude: 5.5 mV
Lead Channel Sensing Intrinsic Amplitude: 5.5 mV
Lead Channel Setting Pacing Amplitude: 2.5 V
Lead Channel Setting Pacing Amplitude: 2.5 V
Lead Channel Setting Pacing Pulse Width: 0.4 ms
Lead Channel Setting Pacing Pulse Width: 0.8 ms
Lead Channel Setting Sensing Sensitivity: 0.3 mV

## 2019-03-17 NOTE — Progress Notes (Signed)
EPIC Encounter for ICM Monitoring  Patient Name: [name] is a 85 y.o. male Date: 03/17/2019 Primary Care Physican: Jinny Sanders, MD Primary Cardiologist:Bensimhon Electrophysiologist:Klein Bi-V Pacing:91.2% Weight: unknown  ERI estimated at 3 months  Call daughter [name].  Patient is doing well and no fluid symptoms.  She reports her family was exposed to [place] and so far the patient is not experiencing any symptoms.   Optivol thoracic impedance normal.   Prescribed: Furosemide40 mg take 1 tablet daily.  Recommendations: No changes and encouraged to call if experiencing any fluid symptoms.  Follow-up plan: ICM clinic phone appointment on 04/17/2019.   91 day device clinic remote transmission 06/14/2019.    Copy of ICM check sent to Dr. Caryl Comes.   3 month ICM trend: 03/16/2019    1 Year ICM trend:       Rosalene Billings, RN 03/17/2019 4:22 PM

## 2019-04-13 ENCOUNTER — Other Ambulatory Visit: Payer: Self-pay

## 2019-04-13 ENCOUNTER — Encounter: Payer: Self-pay | Admitting: Family Medicine

## 2019-04-13 ENCOUNTER — Ambulatory Visit (INDEPENDENT_AMBULATORY_CARE_PROVIDER_SITE_OTHER): Payer: Medicare Other | Admitting: Family Medicine

## 2019-04-13 VITALS — Ht 64.5 in

## 2019-04-13 DIAGNOSIS — M79605 Pain in left leg: Secondary | ICD-10-CM | POA: Diagnosis not present

## 2019-04-13 DIAGNOSIS — M79604 Pain in right leg: Secondary | ICD-10-CM

## 2019-04-13 DIAGNOSIS — R2689 Other abnormalities of gait and mobility: Secondary | ICD-10-CM

## 2019-04-13 DIAGNOSIS — J449 Chronic obstructive pulmonary disease, unspecified: Secondary | ICD-10-CM

## 2019-04-13 MED ORDER — CARVEDILOL 3.125 MG PO TABS: 3.1250 mg | ORAL_TABLET | Freq: Two times a day (BID) | ORAL | 3 refills | Status: AC

## 2019-04-13 MED ORDER — GABAPENTIN 300 MG PO CAPS: 300.0000 mg | ORAL_CAPSULE | Freq: Two times a day (BID) | ORAL | 3 refills | Status: AC

## 2019-04-13 NOTE — Progress Notes (Signed)
Chief Complaint  Patient presents with  . Discuss Hoveround Use  . Medication Refill    Alprazolam (not on medication list nor history list)  . Medication Refill    Allopurinol-Should have refills ( 11-28-2018 #90 with 3 RFs)  . Medication Refill    Gabapentin & Carvedilol-Refills needed.    History of Present Illness: HPI   85 year old  here for mobility assessment for medical necessity of continued use of Hoverround.   His  Hoverround broke.Marland Kitchen He needs repairs.  He has history of spinal stenosis in cervical region, had complications of surgery with blood clot.  Since then he has had weakness in upper and lower legs. Decreased grip strength on bilaterally. Cannot open bottles. Chronic pain in legs. He has severe balance issues.  He has fallen multiple times.  He has decreased ROM of abduction with right arm. Hx of  failed rotator cuff surgery. He has chronic shortness of breath with CHF and COPD.  He has Bilateral lower extremity edema   THE PATIENT CONTINUES TO USE THE POWER WHEELCHAIR to accomplish ADLs He uses Hoverround 12 hours a day at home. He is unable to perform feeding, toileting without use of Hoverround to get him to the kitchen and bathroom.  Daughter has to help him with dressing and cooking. He is home bound for the most part.. daughter do shopping for him.  Leaves for doctor visit only.   He can only walk short distances.Kasandra Knudsen, walker not adequate for his needs given leg weakness and pain.  He cannot use manual wheelchair given decreased arms strength and grip. He cannot use scooter given size limitations of house and hallways.    This visit occurred during the SARS-CoV-2 public health emergency.  Safety protocols were in place, including screening questions prior to the visit, additional usage of staff PPE, and extensive cleaning of exam room while observing appropriate contact time as indicated for disinfecting solutions.   COVID 19 screen:  No recent  travel or known exposure to COVID19 The patient denies respiratory symptoms of COVID 19 at this time. The importance of social distancing was discussed today.     Review of Systems  Constitutional: Negative for chills and fever.  HENT: Negative for congestion and ear pain.   Eyes: Negative for pain and redness.  Respiratory: Negative for cough and shortness of breath.   Cardiovascular: Negative for chest pain, palpitations and leg swelling.  Gastrointestinal: Negative for abdominal pain, blood in stool, constipation, diarrhea, nausea and vomiting.  Genitourinary: Negative for dysuria.  Musculoskeletal: Negative for falls and myalgias.  Skin: Negative for rash.  Neurological: Negative for dizziness.  Psychiatric/Behavioral: Negative for depression. The patient is not nervous/anxious.       Past Medical History:  Diagnosis Date  . AICD (automatic cardioverter/defibrillator) present   . Anemia    iron deficiency anemia with previous severe GI bleed   . Arthritis    back and knees  . Atrial fibrillation ([place])    s/p AV node ablation; not on coumadin due to GIB  . 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 CAD (coronary artery disease)    Mild, nonobstructive (LHC 1/07: mLAD 20%, pCFX 20-30%, mRCA 30%, EF 25%)  . Cancer [facility])    prostate  . Cardiac arrest - ventricular fibrillation 02/2008   Aborted, shocked by ICD  . Cervical radiculopathy    left  . CHF (congestive heart failure) ([place])    secondary  to nonischemic cardiomyopathy; ECHO 10/07 EF 20-25%, mod to severe MR; ECHO 1/10 EF 55-60%, mild MR; ECHO 2/11 55-65%, grade 1 diast dysfxn, miod to mod LAE, mild TR;    S/P Medtronic BiV ICD with biventricular function now turned off due to diahragmatic simulation  . Chronic kidney disease   . Chronic neck pain   . COPD (chronic obstructive pulmonary disease) (HCC)    GOLD II; Spirometry 07/10/2008 >FEV1 1.46 56% predicted, ratio of 66%  . Dementia ([place])   .  Diabetes mellitus    type 2 NIDDM x 5-6 yrs  . Dyspnea    exertional  . GERD (gastroesophageal reflux disease)   . Hyperlipidemia   . Hypertension   . Inguinal hernia    left; asymptomatic  . Left bundle branch block   . Medical history non-contributory   . Obesity   . Pneumonia    01/2011  . Presence of permanent cardiac pacemaker   . Prostatic hypertrophy 09-11-97   Benign  . Torticollis     reports that he has been smoking cigarettes. He has a 60.00 pack-year smoking history. He has never used smokeless tobacco. He reports current alcohol use. He reports that he does not use drugs.   Current Outpatient Medications:  .  acetaminophen (TYLENOL) 500 MG tablet, Take 500 mg by mouth every 6 (six) hours as needed for mild pain. , Disp: , Rfl:  .  albuterol (VENTOLIN HFA) 108 (90 Base) MCG/ACT inhaler, USE 2 INHALATIONS ORALLY EVERY 6 HOURS AS NEEDED FOR SHORTNESS OF BREATH OR WHEEZING, Disp: 25.5 g, Rfl: 3 .  allopurinol (ZYLOPRIM) 100 MG tablet, Take 1 tablet (100 mg total) by mouth daily., Disp: 90 tablet, Rfl: 3 .  aspirin EC 81 MG tablet, Take 81 mg by mouth 2 (two) times daily., Disp: , Rfl:  .  atorvastatin (LIPITOR) 20 MG tablet, TAKE 1 TABLET AT BEDTIME, Disp: 90 tablet, Rfl: 3 .  blood glucose meter kit and supplies, Dispense based on patient and insurance preference. Use to check blood sugar daily., Disp: 1 each, Rfl: 0 .  carvedilol (COREG) 3.125 MG tablet, TAKE 1 TABLET TWICE A DAY  WITH MEALS, Disp: 180 tablet, Rfl: 3 .  Cholecalciferol (TH VITAMIN D3) 2000 UNITS CAPS, Take 2,000 Units by mouth daily. , Disp: , Rfl:  .  DEXILANT 60 MG capsule, TAKE 1 CAPSULE DAILY, Disp: 90 capsule, Rfl: 3 .  Docusate Calcium (STOOL SOFTENER PO), Take 100 tablets by mouth as needed., Disp: , Rfl:  .  donepezil (ARICEPT) 10 MG tablet, TAKE 1 TABLET AT BEDTIME, Disp: 90 tablet, Rfl: 3 .  dutasteride (AVODART) 0.5 MG capsule, TAKE 1 CAPSULE DAILY, Disp: 90 capsule, Rfl: 3 .  eplerenone (INSPRA)  25 MG tablet, TAKE 1/2 TABLET (12.5 MG TOTAL) DAILY (NEED APPOINTMENT), Disp: 45 tablet, Rfl: 3 .  furosemide (LASIX) 40 MG tablet, TAKE 1 TABLET DAILY, Disp: 90 tablet, Rfl: 3 .  gabapentin (NEURONTIN) 300 MG capsule, Take 1 capsule (300 mg total) by mouth 2 (two) times daily., Disp: 180 capsule, Rfl: 3 .  guaiFENesin (MUCINEX) 600 MG 12 hr tablet, Take 1,200 mg by mouth 2 (two) times daily as needed for cough. , Disp: , Rfl:  .  KLOR-CON M20 20 MEQ tablet, TAKE 1 TABLET TWICE A DAY, Disp: 180 tablet, Rfl: 3 .  linaclotide (LINZESS) 290 MCG CAPS capsule, Take 1 capsule (290 mcg total) by mouth daily before breakfast., Disp: 90 capsule, Rfl: 3 .  lisinopril (ZESTRIL) 2.5  MG tablet, TAKE 1 TABLET DAILY, Disp: 90 tablet, Rfl: 3 .  SPIRIVA HANDIHALER 18 MCG inhalation capsule, INHALE THE CONTENTS OF ONE CAPSULE DAILY IN THE       MORNING VIA HANDIHALER     DEVICE, Disp: 90 capsule, Rfl: 0 .  tamsulosin (FLOMAX) 0.4 MG CAPS capsule, TAKE 1 CAPSULE DAILY AFTER SUPPER, Disp: 90 capsule, Rfl: 0   Observations/Objective: Height 5' 4.5" (1.638 m).  Physical Exam  Physical Exam Constitutional:      General: The patient is not in acute distress. Pulmonary:     Effort: Pulmonary effort is normal. No respiratory distress.  Neurological:     Mental Status: The patient is alert and oriented to person, place, and time.  Psychiatric:        Mood and Affect: Mood normal.        Behavior: Behavior normal.  Assessment and Plan    Decreased mobility due to SOB from COPD, chronic neck pain, Bilateral arm and leg weakness.. Resulting in need for hover-round to use house.  PMD is necessary to perform ADLS at home including getting to bathroom to toilet, getting to kitchen to eat and cooking, getting to bedroom to dress etc.  He cannot use a cane/walker given his poor balance and arm  And leg weakness.  His arms are to weak to propel and manual wheelchair and it increases his neck pain. A scooter is to  wide for the hallways of his home.  He can operate an hoveround safely, both mentally and physically.  He is willing and motivated to use the device.Marland Kitchen He currently uses it daily.        Eliezer Lofts, MD

## 2019-04-18 ENCOUNTER — Telehealth: Payer: Self-pay

## 2019-04-18 NOTE — Telephone Encounter (Signed)
Unable to speak  with patient to remind of missed remote transmission 

## 2019-05-02 NOTE — Progress Notes (Signed)
No ICM remote transmission received for 04/17/2019 and next ICM transmission scheduled for 05/22/2019.   

## 2019-05-05 ENCOUNTER — Telehealth: Payer: Self-pay

## 2019-05-05 NOTE — Telephone Encounter (Signed)
CRT-D device alert received for RRT on 05/02/19.  Last OV was in 2019.  Pt is pacer dependant.    Spoke with pt daughter [name], [place] on record.  Advised battery is nearing end.  Pt does not have an upcoming appt.  Advised scheduler will contact her to set up an appt with Dr. Caryl Comes.

## 2019-05-16 ENCOUNTER — Telehealth: Payer: Self-pay | Admitting: Internal Medicine

## 2019-05-16 NOTE — Telephone Encounter (Signed)
New message   Patient's daughter states that she will be coming with patient to Dr. Olin Pia appt on tomorrow due to patient being in wheelchair. Please advise.

## 2019-05-16 NOTE — Telephone Encounter (Signed)
Pt is in wheel chair.  Daughter will accompany pt to his appointment.  Note placed in appointment notes as well.

## 2019-05-17 ENCOUNTER — Encounter: Payer: Self-pay | Admitting: Internal Medicine

## 2019-05-17 ENCOUNTER — Ambulatory Visit (INDEPENDENT_AMBULATORY_CARE_PROVIDER_SITE_OTHER): Payer: Medicare Other | Admitting: Internal Medicine

## 2019-05-17 ENCOUNTER — Other Ambulatory Visit: Payer: Self-pay

## 2019-05-17 VITALS — BP 102/54 | HR 70 | Ht 64.5 in | Wt 144.0 lb

## 2019-05-17 DIAGNOSIS — I5032 Chronic diastolic (congestive) heart failure: Secondary | ICD-10-CM

## 2019-05-17 DIAGNOSIS — Z01812 Encounter for preprocedural laboratory examination: Secondary | ICD-10-CM

## 2019-05-17 DIAGNOSIS — Z95810 Presence of automatic (implantable) cardiac defibrillator: Secondary | ICD-10-CM | POA: Diagnosis not present

## 2019-05-17 NOTE — Patient Instructions (Signed)
Medication Instructions:  Your physician recommends that you continue on your current medications as directed. Please refer to the Current Medication list given to you today.  Labwork: None ordered.  Testing/Procedures: CRT-P will be scheduled for July 03, 2019.  I will call you with instructions.  Follow-Up: Follow up will be as scheduled.  Remote monitoring is used to monitor your Pacemaker of ICD from home. This monitoring reduces the number of office visits required to check your device to one time per year. It allows [place] to keep an eye on the functioning of your device to ensure it is working properly.  Any Other Special Instructions Will Be Listed Below (If Applicable).  If you need a refill on your cardiac medications before your next appointment, please call your pharmacy.

## 2019-05-17 NOTE — Progress Notes (Signed)
Patient Care Team: Jinny Sanders, MD as PCP - General (Family Medicine) Bensimhon, Shaune Pascal, MD (Cardiology) Bensimhon, Shaune Pascal, MD (Cardiology) Deboraha Sprang, MD (Cardiology)   HPI  [name] is a 86 y.o. male seen in followup for CRT-D implantation in the setting of nonischemic myopathy and atrial fibrillation.  Device generator replacement 10/14.  November 2010 he had syncope with appropriate VF therapy For polymorphic ventricular tachycardia. Device at RRT January 2021  Previously on warfarin. This was discontinued because of GI bleeding and he has been treated with aspirin  status post AV junction ablation w intercurrent normalization of LV systolic function.   He is not very active.  Denies chest pain or shortness of breath. No edema He lives by himself next to his daughter.  Dementia is progressing.  Date Cr K Hgb  12/18 1.28 4.1 11.1  6/20  1.06 4.6 10.7   His wife of 88 years died in 02/27/2023.  She used to call honey bunny  Past Medical History:  Diagnosis Date  . AICD (automatic cardioverter/defibrillator) present   . Anemia    iron deficiency anemia with previous severe GI bleed   . Arthritis    back and knees  . Atrial fibrillation ([place])    s/p AV node ablation; not on coumadin due to GIB  . 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 CAD (coronary artery disease)    Mild, nonobstructive (LHC 1/07: mLAD 20%, pCFX 20-30%, mRCA 30%, EF 25%)  . Cancer [facility])    prostate  . Cardiac arrest - ventricular fibrillation 02/2008   Aborted, shocked by ICD  . Cervical radiculopathy    left  . CHF (congestive heart failure) (HCC)    secondary to nonischemic cardiomyopathy; ECHO 10/07 EF 20-25%, mod to severe MR; ECHO 1/10 EF 55-60%, mild MR; ECHO 2/11 55-65%, grade 1 diast dysfxn, miod to mod LAE, mild TR;    S/P Medtronic BiV ICD with biventricular function now turned off due to diahragmatic simulation  . Chronic kidney disease   . Chronic neck  pain   . COPD (chronic obstructive pulmonary disease) (HCC)    GOLD II; Spirometry 07/10/2008 >FEV1 1.46 56% predicted, ratio of 66%  . Dementia ([place])   . Diabetes mellitus    type 2 NIDDM x 5-6 yrs  . Dyspnea    exertional  . GERD (gastroesophageal reflux disease)   . Hyperlipidemia   . Hypertension   . Inguinal hernia    left; asymptomatic  . Left bundle branch block   . Medical history non-contributory   . Obesity   . Pneumonia    02-27-2011  . Presence of permanent cardiac pacemaker   . Prostatic hypertrophy 09-11-97   Benign  . Torticollis     Past Surgical History:  Procedure Laterality Date  . ANTERIOR CERVICAL DISCECTOMY  02/2010  . BIV ICD GENERTAOR CHANGE OUT N/A 02/08/2013   Procedure: BIV ICD GENERTAOR CHANGE OUT;  Surgeon: Deboraha Sprang, MD;  Location[facility] CATH LAB;  Service: Cardiovascular;  Laterality: N/A;  . CARDIAC CATHETERIZATION  26-Feb-2006   Showed mild nonobstructive CAD  . CARDIAC CATHETERIZATION  11/05/2006   Right atrial pressure mean of 12, RV pressure 36/8, PA pressure 39/16 witha mean of 28, wedge pressure was 20. Fick cardiac output was 5 liters per minute, cardiac index was 2.4  . COLONOSCOPY  06/26/2002   Multip (neg) divertics, int hemm  . COLONOSCOPY  07/10/2004  Poyps, divertics, int hemms  . COLONOSCOPY  06/19/2008   2 polyps divertics int hemms (Dr Henrene Pastor)  . COLONOSCOPY W/ POLYPECTOMY  11/1997   Divertics, int hemms  . CORONARY ANGIOPLASTY     Min abstrut zd severe LB dystn EF 25%  . CT HEAD LIMITED W/O CM  10/03/2006   No acute abnmlty  . ESOPHAGOGASTRODUODENOSCOPY  09/1997   Prepylor ulcer, esoph ring, duod avm  . ESOPHAGOGASTRODUODENOSCOPY  05/22/2002   Poss Barrett's   . ESOPHAGOGASTRODUODENOSCOPY  02/19/2006   HH No active bleeding  . EYE SURGERY     cataract, bilateral  . HOSP  8/14-8/23/2008   Acute on chronis CHF IIIB N[place]isch Cardiomyop EF 20-25% Mod-Sev MR  . INSERT / REPLACE / REMOVE PACEMAKER  2007  . KNEE ARTHROSCOPY   1988  . KNEE SURGERY  1996   Left  . LOBECTOMY  01-21-98   lung  . LUMBAR LAMINECTOMY/DECOMPRESSION MICRODISCECTOMY  04/29/2011   Procedure: LUMBAR LAMINECTOMY/DECOMPRESSION MICRODISCECTOMY;  Surgeon: Eustace Moore, MD;  Location: [facility];  Service: Neurosurgery;  Laterality: N/A;  Lumbar Two, Three, Four, Five Decompressive Lumbar Laminectomies  . [facility] GI BLEED  02/07-02/12/2002  . [facility] SOB  10/11-10/18/2007   A fib, CHF  . [facility] x  11/08-11/03/2006   Acute blood loss, anemia, sys HF, isch cardiomyopathy  . ROTATOR CUFF REPAIR  1994 and 1995    Current Outpatient Medications  Medication Sig Dispense Refill  . acetaminophen (TYLENOL) 500 MG tablet Take 500 mg by mouth every 6 (six) hours as needed for mild pain.     Marland Kitchen albuterol (VENTOLIN HFA) 108 (90 Base) MCG/ACT inhaler USE 2 INHALATIONS ORALLY EVERY 6 HOURS AS NEEDED FOR SHORTNESS OF BREATH OR WHEEZING 25.5 g 3  . allopurinol (ZYLOPRIM) 100 MG tablet Take 1 tablet (100 mg total) by mouth daily. 90 tablet 3  . aspirin EC 81 MG tablet Take 81 mg by mouth 2 (two) times daily.    Marland Kitchen atorvastatin (LIPITOR) 20 MG tablet TAKE 1 TABLET AT BEDTIME 90 tablet 3  . blood glucose meter kit and supplies Dispense based on patient and insurance preference. Use to check blood sugar daily. 1 each 0  . carvedilol (COREG) 3.125 MG tablet Take 1 tablet (3.125 mg total) by mouth 2 (two) times daily with a meal. 180 tablet 3  . Cholecalciferol (TH VITAMIN D3) 2000 UNITS CAPS Take 2,000 Units by mouth daily.     Marland Kitchen DEXILANT 60 MG capsule TAKE 1 CAPSULE DAILY 90 capsule 3  . Docusate Calcium (STOOL SOFTENER PO) Take 100 tablets by mouth as needed.    . donepezil (ARICEPT) 10 MG tablet TAKE 1 TABLET AT BEDTIME 90 tablet 3  . dutasteride (AVODART) 0.5 MG capsule TAKE 1 CAPSULE DAILY 90 capsule 3  . eplerenone (INSPRA) 25 MG tablet TAKE 1/2 TABLET (12.5 MG TOTAL) DAILY (NEED APPOINTMENT) 45 tablet 3  . furosemide (LASIX) 40 MG tablet TAKE 1 TABLET DAILY 90 tablet  3  . gabapentin (NEURONTIN) 300 MG capsule Take 1 capsule (300 mg total) by mouth 2 (two) times daily. 180 capsule 3  . guaiFENesin (MUCINEX) 600 MG 12 hr tablet Take 1,200 mg by mouth 2 (two) times daily as needed for cough.     Marland Kitchen KLOR-CON M20 20 MEQ tablet TAKE 1 TABLET TWICE A DAY 180 tablet 3  . linaclotide (LINZESS) 290 MCG CAPS capsule Take 1 capsule (290 mcg total) by mouth daily before breakfast. 90 capsule 3  . lisinopril (  ZESTRIL) 2.5 MG tablet TAKE 1 TABLET DAILY 90 tablet 3  . SPIRIVA HANDIHALER 18 MCG inhalation capsule INHALE THE CONTENTS OF ONE CAPSULE DAILY IN THE       MORNING VIA HANDIHALER     DEVICE 90 capsule 0  . tamsulosin (FLOMAX) 0.4 MG CAPS capsule TAKE 1 CAPSULE DAILY AFTER SUPPER 90 capsule 0   No current facility-administered medications for this visit.    No Known Allergies  Review of Systems negative except from HPI and PMH  Physical Exam BP (!) 102/54   Pulse 70   Ht 5' 4.5" (1.638 m)   Wt 144 lb (65.3 kg)   SpO2 97%   BMI 24.34 kg/m  Well developed and well nourished in no acute distress HENT normal Neck supple with JVP-flat Clear Device pocket well healed; without hematoma or erythema.  There is no tethering  Regular rate and rhythm, no  gallop No  murmur Abd-soft with active BS No Clubbing cyanosis   edema Skin-warm and dry A & Oriented x1 grossly normal sensory and motor function  ECG atrial fibrillation with complete heart block and biventricular pacing upright QRS lead V1 RS lead I   Assessment and  Plan  Atrial fibrillation-permanent  Cardiomyopathy-resolved  CRT-D-Medtronic  Anemia- chronic  Complete heart block status post AV ablation  Dementia  Grief   Euvolemic continue current meds His device has reached RRT.  Had a discussion with him and his daughter.  He is requesting previously a DNR.  We will write that order for him today.  In the setting of that, we have discussed CRT-D implantation or CRT-P downgrade.  I am not  sure that he understands; his daughter is inclined towards CRT-P downgrade which I think is consistent with his previous requests of a DNR.  We have reviewed the benefits and risks of generator replacement.  These include but are not limited to lead fracture and infection.  The patient understands, agrees and is willing to proceed.        More than 50% of 40 min was spent in counseling related to the above

## 2019-05-22 ENCOUNTER — Ambulatory Visit (INDEPENDENT_AMBULATORY_CARE_PROVIDER_SITE_OTHER): Payer: Medicare Other

## 2019-05-22 ENCOUNTER — Telehealth: Payer: Self-pay

## 2019-05-22 DIAGNOSIS — I5032 Chronic diastolic (congestive) heart failure: Secondary | ICD-10-CM

## 2019-05-22 DIAGNOSIS — Z95810 Presence of automatic (implantable) cardiac defibrillator: Secondary | ICD-10-CM | POA: Diagnosis not present

## 2019-05-22 NOTE — Telephone Encounter (Signed)
[place] Night - Client Nonclinical Telephone Record AccessNurse Client [place] Night - Client Client Site [place] - Night Physician Eliezer Lofts - MD Contact Type Call Who Is Calling Patient / Member / Family / Caregiver Caller Name Lexine Baton Caller Phone Number [phone] Patient Name [name] Patient DOB 1930/11/24 Call Type Message Only Information Provided Reason for Call Request to Schedule Office Appointment Initial Comment Caller states they would like to get the patient in to be seen on Tuesday. States the patient fell this morning and he is having shoulder issues and pain. Additional Comment Declined triage. Office hours provided. Disp. Time Disposition Final User 05/21/2019 3:54:03 PM General Information Provided Yes Arnaldo Natal Call Closed By: Arnaldo Natal Transaction Date/Time: 05/21/2019 3:51:34 PM (ET)

## 2019-05-22 NOTE — Telephone Encounter (Signed)
Spoke with pt's daughter,(DPR), [name] and advised pt's procedure for CRT-P has been scheduled for 07/03/2019 with arrival time of 11am.  Reviewed instruction letter and copy placed in [place] mail as well as downstairs along with surgical scrub for pick up.  Pt's daughter verbalizes understanding of all instructions given(see letter) and agrees with current plan.

## 2019-05-22 NOTE — Telephone Encounter (Signed)
Appointment 2/9

## 2019-05-22 NOTE — Progress Notes (Signed)
EPIC Encounter for ICM Monitoring  Patient Name: [name] is a 86 y.o. male Date: 05/22/2019 Primary Care Physican: Jinny Sanders, MD Primary Cardiologist:Bensimhon Electrophysiologist:Klein Bi-V Pacing:90.4% Weight: unknown   Call daughter [name] and patient is doing well.  He has not voiced any fluid symptoms. She reports he does not always take Furosemide as ordered.  Device battery replacement scheduled for 07/03/2019.  Optivol thoracic impedance suggesting possible fluid accumulation since ~05/16/2019.   Prescribed: Furosemide40 mg take 1 tablet daily.  Labs: 09/13/2018 Creatinine 1.06, BUN 18, Potassium 4.6, Sodium 137, GFR 66.27  Recommendations: Advised to take Furosemide 1 tablet twice a day x 2 days and then return to 1 tablet daily.    Follow-up plan: ICM clinic phone appointment on 05/29/2019 (manual send) to recheck fluid levels. 91 day device clinic remote transmission 06/14/2019.   Copy of ICM check sent to Dr.Klein and Dr Haroldine Laws.   3 month ICM trend: 05/22/2019    1 Year ICM trend:       Rosalene Billings, RN 05/22/2019 2:13 PM

## 2019-05-22 NOTE — Addendum Note (Signed)
Addended by: Thora Lance on: 05/22/2019 03:26 PM   Modules accepted: Orders

## 2019-05-23 ENCOUNTER — Other Ambulatory Visit: Payer: Self-pay

## 2019-05-23 ENCOUNTER — Encounter: Payer: Self-pay | Admitting: Family Medicine

## 2019-05-23 ENCOUNTER — Other Ambulatory Visit: Payer: Self-pay | Admitting: Family Medicine

## 2019-05-23 ENCOUNTER — Ambulatory Visit (INDEPENDENT_AMBULATORY_CARE_PROVIDER_SITE_OTHER): Payer: Medicare Other | Admitting: Family Medicine

## 2019-05-23 ENCOUNTER — Telehealth: Payer: Self-pay | Admitting: Radiology

## 2019-05-23 VITALS — BP 140/88 | HR 68 | Temp 98.3°F | Ht 64.5 in

## 2019-05-23 DIAGNOSIS — F0390 Unspecified dementia without behavioral disturbance: Secondary | ICD-10-CM | POA: Diagnosis not present

## 2019-05-23 DIAGNOSIS — E538 Deficiency of other specified B group vitamins: Secondary | ICD-10-CM | POA: Diagnosis not present

## 2019-05-23 DIAGNOSIS — W19XXXA Unspecified fall, initial encounter: Secondary | ICD-10-CM

## 2019-05-23 DIAGNOSIS — D649 Anemia, unspecified: Secondary | ICD-10-CM

## 2019-05-23 DIAGNOSIS — S40212A Abrasion of left shoulder, initial encounter: Secondary | ICD-10-CM | POA: Diagnosis not present

## 2019-05-23 DIAGNOSIS — R531 Weakness: Secondary | ICD-10-CM | POA: Diagnosis not present

## 2019-05-23 DIAGNOSIS — M25512 Pain in left shoulder: Secondary | ICD-10-CM | POA: Insufficient documentation

## 2019-05-23 DIAGNOSIS — G8911 Acute pain due to trauma: Secondary | ICD-10-CM | POA: Insufficient documentation

## 2019-05-23 DIAGNOSIS — E559 Vitamin D deficiency, unspecified: Secondary | ICD-10-CM

## 2019-05-23 DIAGNOSIS — E876 Hypokalemia: Secondary | ICD-10-CM

## 2019-05-23 DIAGNOSIS — F03B0 Unspecified dementia, moderate, without behavioral disturbance, psychotic disturbance, mood disturbance, and anxiety: Secondary | ICD-10-CM

## 2019-05-23 LAB — COMPREHENSIVE METABOLIC PANEL
ALT: 11 U/L (ref 0–53)
AST: 20 U/L (ref 0–37)
Albumin: 3.4 g/dL — ABNORMAL LOW (ref 3.5–5.2)
Alkaline Phosphatase: 98 U/L (ref 39–117)
BUN: 14 mg/dL (ref 6–23)
CO2: 33 mEq/L — ABNORMAL HIGH (ref 19–32)
Calcium: 8.5 mg/dL (ref 8.4–10.5)
Chloride: 100 mEq/L (ref 96–112)
Creatinine, Ser: 1.05 mg/dL (ref 0.40–1.50)
GFR: 66.89 mL/min (ref >60.00)
Glucose, Bld: 90 mg/dL (ref 70–99)
Potassium: 2.4 mEq/L — CL (ref 3.5–5.1)
Sodium: 140 mEq/L (ref 135–145)
Total Bilirubin: 0.5 mg/dL (ref 0.2–1.2)
Total Protein: 5.8 g/dL — ABNORMAL LOW (ref 6.0–8.3)

## 2019-05-23 LAB — CBC WITH DIFFERENTIAL/PLATELET
Basophils Absolute: 0 K/uL (ref 0.0–0.1)
Basophils Relative: 0.5 % (ref 0.0–3.0)
Eosinophils Absolute: 0 K/uL (ref 0.0–0.7)
Eosinophils Relative: 0.5 % (ref 0.0–5.0)
HCT: 29.1 % — ABNORMAL LOW (ref 39.0–52.0)
Hemoglobin: 9.6 g/dL — ABNORMAL LOW (ref 13.0–17.0)
Lymphocytes Relative: 45 % (ref 12.0–46.0)
Lymphs Abs: 3.7 K/uL (ref 0.7–4.0)
MCHC: 33.1 g/dL (ref 30.0–36.0)
MCV: 89.7 fl (ref 78.0–100.0)
Monocytes Absolute: 0.6 K/uL (ref 0.1–1.0)
Monocytes Relative: 7.3 % (ref 3.0–12.0)
Neutro Abs: 3.8 K/uL (ref 1.4–7.7)
Neutrophils Relative %: 46.7 % (ref 43.0–77.0)
Platelets: 204 K/uL (ref 150.0–400.0)
RBC: 3.24 Mil/uL — ABNORMAL LOW (ref 4.22–5.81)
RDW: 14.5 % (ref 11.5–15.5)
WBC: 8.1 K/uL (ref 4.0–10.5)

## 2019-05-23 LAB — TSH: TSH: 1.36 u[IU]/mL (ref 0.35–4.50)

## 2019-05-23 LAB — VITAMIN D 25 HYDROXY (VIT D DEFICIENCY, FRACTURES): VITD: 44.49 ng/mL (ref 30.00–100.00)

## 2019-05-23 LAB — VITAMIN B12: Vitamin B-12: 143 pg/mL — ABNORMAL LOW (ref 211–911)

## 2019-05-23 NOTE — Assessment & Plan Note (Addendum)
Full ROM of bilateral shoulder no, joint pain or bony tenderness. Sore over abrasion on left lateral shoulder, well healing.  Treat with neosporin and bandage.

## 2019-05-23 NOTE — Assessment & Plan Note (Signed)
Continue aricept , may want to increase dose given progression.

## 2019-05-23 NOTE — Progress Notes (Signed)
Chief Complaint  Patient presents with  . Fall    Sunday  . Shoulder Pain    Left    History of Present Illness: HPI   86 year old male presents following accidental fall 2 days ago. He sleeps on couch at night.  Daughter reports  she thinks he rolled off cough.. daughter found him found him next morning. He was awake when she found him at 8 in morning. Not sure when rooled off during the night. No cuts or bruises.  Daughter lives next door to him.     Yesterday left shoulder was hurting but today it is better.    He has been more sleepy and confused over last 3 months. No dysuria  no headache.  no SOB, no chest pain   In March scheduled to have pacemaker placed for compete heart block  Dr. Caryl Comes is cardiologist.    This visit occurred during the SARS-CoV-2 public health emergency.  Safety protocols were in place, including screening questions prior to the visit, additional usage of staff PPE, and extensive cleaning of exam room while observing appropriate contact time as indicated for disinfecting solutions.   COVID 19 screen:  No recent travel or known exposure to COVID19 The patient denies respiratory symptoms of COVID 19 at this time. The importance of social distancing was discussed today.     ROS    Past Medical History:  Diagnosis Date  . AICD (automatic cardioverter/defibrillator) present   . Anemia    iron deficiency anemia with previous severe GI bleed   . Arthritis    back and knees  . Atrial fibrillation ([place])    s/p AV node ablation; not on coumadin due to GIB  . 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 CAD (coronary artery disease)    Mild, nonobstructive (LHC 1/07: mLAD 20%, pCFX 20-30%, mRCA 30%, EF 25%)  . Cancer [facility])    prostate  . Cardiac arrest - ventricular fibrillation 02/2008   Aborted, shocked by ICD  . Cervical radiculopathy    left  . CHF (congestive heart failure) (HCC)    secondary to nonischemic  cardiomyopathy; ECHO 10/07 EF 20-25%, mod to severe MR; ECHO 1/10 EF 55-60%, mild MR; ECHO 2/11 55-65%, grade 1 diast dysfxn, miod to mod LAE, mild TR;    S/P Medtronic BiV ICD with biventricular function now turned off due to diahragmatic simulation  . Chronic kidney disease   . Chronic neck pain   . COPD (chronic obstructive pulmonary disease) (HCC)    GOLD II; Spirometry 07/10/2008 >FEV1 1.46 56% predicted, ratio of 66%  . Dementia ([place])   . Diabetes mellitus    type 2 NIDDM x 5-6 yrs  . Dyspnea    exertional  . GERD (gastroesophageal reflux disease)   . Hyperlipidemia   . Hypertension   . Inguinal hernia    left; asymptomatic  . Left bundle branch block   . Medical history non-contributory   . Obesity   . Pneumonia    01/2011  . Presence of permanent cardiac pacemaker   . Prostatic hypertrophy 09-11-97   Benign  . Torticollis     reports that he has been smoking cigarettes. He has a 60.00 pack-year smoking history. He has never used smokeless tobacco. He reports current alcohol use. He reports that he does not use drugs.   Current Outpatient Medications:  .  acetaminophen (TYLENOL) 500 MG tablet, Take 500 mg by mouth every  6 (six) hours as needed for mild pain. , Disp: , Rfl:  .  albuterol (VENTOLIN HFA) 108 (90 Base) MCG/ACT inhaler, USE 2 INHALATIONS ORALLY EVERY 6 HOURS AS NEEDED FOR SHORTNESS OF BREATH OR WHEEZING, Disp: 25.5 g, Rfl: 3 .  allopurinol (ZYLOPRIM) 100 MG tablet, Take 1 tablet (100 mg total) by mouth daily., Disp: 90 tablet, Rfl: 3 .  aspirin EC 81 MG tablet, Take 81 mg by mouth 2 (two) times daily., Disp: , Rfl:  .  atorvastatin (LIPITOR) 20 MG tablet, TAKE 1 TABLET AT BEDTIME, Disp: 90 tablet, Rfl: 3 .  blood glucose meter kit and supplies, Dispense based on patient and insurance preference. Use to check blood sugar daily., Disp: 1 each, Rfl: 0 .  carvedilol (COREG) 3.125 MG tablet, Take 1 tablet (3.125 mg total) by mouth 2 (two) times daily with a meal., Disp:  180 tablet, Rfl: 3 .  Cholecalciferol (TH VITAMIN D3) 2000 UNITS CAPS, Take 2,000 Units by mouth daily. , Disp: , Rfl:  .  DEXILANT 60 MG capsule, TAKE 1 CAPSULE DAILY, Disp: 90 capsule, Rfl: 3 .  Docusate Calcium (STOOL SOFTENER PO), Take 100 tablets by mouth as needed., Disp: , Rfl:  .  donepezil (ARICEPT) 10 MG tablet, TAKE 1 TABLET AT BEDTIME, Disp: 90 tablet, Rfl: 3 .  dutasteride (AVODART) 0.5 MG capsule, TAKE 1 CAPSULE DAILY, Disp: 90 capsule, Rfl: 3 .  eplerenone (INSPRA) 25 MG tablet, TAKE 1/2 TABLET (12.5 MG TOTAL) DAILY (NEED APPOINTMENT), Disp: 45 tablet, Rfl: 3 .  furosemide (LASIX) 40 MG tablet, TAKE 1 TABLET DAILY, Disp: 90 tablet, Rfl: 3 .  gabapentin (NEURONTIN) 300 MG capsule, Take 1 capsule (300 mg total) by mouth 2 (two) times daily., Disp: 180 capsule, Rfl: 3 .  guaiFENesin (MUCINEX) 600 MG 12 hr tablet, Take 1,200 mg by mouth 2 (two) times daily as needed for cough. , Disp: , Rfl:  .  KLOR-CON M20 20 MEQ tablet, TAKE 1 TABLET TWICE A DAY, Disp: 180 tablet, Rfl: 3 .  linaclotide (LINZESS) 290 MCG CAPS capsule, Take 1 capsule (290 mcg total) by mouth daily before breakfast., Disp: 90 capsule, Rfl: 3 .  lisinopril (ZESTRIL) 2.5 MG tablet, TAKE 1 TABLET DAILY, Disp: 90 tablet, Rfl: 3 .  SPIRIVA HANDIHALER 18 MCG inhalation capsule, INHALE THE CONTENTS OF ONE CAPSULE DAILY IN THE       MORNING VIA HANDIHALER     DEVICE, Disp: 90 capsule, Rfl: 0 .  tamsulosin (FLOMAX) 0.4 MG CAPS capsule, TAKE 1 CAPSULE DAILY AFTER SUPPER, Disp: 90 capsule, Rfl: 0   Observations/Objective: Blood pressure 140/88, pulse 68, temperature 98.3 F (36.8 C), temperature source Temporal, height 5' 4.5" (1.638 m), SpO2 94 %.  Physical Exam Constitutional:      Appearance: He is well-developed. He is obese.     Comments: elderly male, hard of hearing, disoriented, perserevation  HENT:     Head: Normocephalic.     Right Ear: Hearing, tympanic membrane and external ear normal.     Left Ear: Hearing,  tympanic membrane and external ear normal.     Nose: Nose normal.  Neck:     Thyroid: No thyroid mass or thyromegaly.     Vascular: No carotid bruit.     Trachea: Trachea normal.  Cardiovascular:     Rate and Rhythm: Regular rhythm. Bradycardia present.     Pulses: Normal pulses.     Heart sounds: Heart sounds are distant. No murmur. No friction rub. No gallop.  Comments: No peripheral edema  defibrillator in place Pulmonary:     Effort: Pulmonary effort is normal. No respiratory distress.     Breath sounds: Normal breath sounds.  Musculoskeletal:     Comments: Full ROM of bilateral shoulder no, joint pain or bony tenderness. Sore over abraision on left lateral shoulder, well healing  Skin:    General: Skin is warm and dry.     Findings: No rash.  Neurological:     Mental Status: He is lethargic and disoriented.     Cranial Nerves: Cranial nerves are intact.     Motor: Motor function is intact.     Comments:  Falling asleep off and on during appt., this has been new baseline over last 2-3 months.   Wheel chair bound  Psychiatric:        Speech: Speech normal.        Behavior: Behavior normal.        Thought Content: Thought content normal.      Assessment and Plan  Accidental fall.. no clear joint or bony injury. ONly mild ttp over abrasion today. No indication for X-ray at this time.   Weakness and increased confusion.. eval with labs to rule out secondary cause.  Possibly progressive dementia.   Abrasion of left shoulder Full ROM of bilateral shoulder no, joint pain or bony tenderness. Sore over abrasion on left lateral shoulder, well healing.  Treat with neosporin and bandage.  Moderate dementia without behavioral disturbance  Continue aricept , may want to increase dose given progression.    Eliezer Lofts, MD

## 2019-05-23 NOTE — Telephone Encounter (Signed)
[facility] called a critical K+. 2.4. Results given to Dr Diona Browner

## 2019-05-23 NOTE — Patient Instructions (Signed)
Please stop at the lab to have labs drawn.  

## 2019-05-23 NOTE — Telephone Encounter (Signed)
Addressed in result note.  

## 2019-05-25 NOTE — Telephone Encounter (Signed)
Pt had visit on 05/23/19.

## 2019-05-29 ENCOUNTER — Other Ambulatory Visit (INDEPENDENT_AMBULATORY_CARE_PROVIDER_SITE_OTHER): Payer: Medicare Other

## 2019-05-29 ENCOUNTER — Other Ambulatory Visit: Payer: Self-pay

## 2019-05-29 DIAGNOSIS — D649 Anemia, unspecified: Secondary | ICD-10-CM | POA: Diagnosis not present

## 2019-05-29 DIAGNOSIS — E876 Hypokalemia: Secondary | ICD-10-CM

## 2019-05-29 LAB — BASIC METABOLIC PANEL
BUN: 13 mg/dL (ref 6–23)
CO2: 29 mEq/L (ref 19–32)
Calcium: 9 mg/dL (ref 8.4–10.5)
Chloride: 104 mEq/L (ref 96–112)
Creatinine, Ser: 1.32 mg/dL (ref 0.40–1.50)
GFR: 51.36 mL/min — ABNORMAL LOW (ref >60.00)
Glucose, Bld: 94 mg/dL (ref 70–99)
Potassium: 5.3 mEq/L — ABNORMAL HIGH (ref 3.5–5.1)
Sodium: 139 mEq/L (ref 135–145)

## 2019-05-29 LAB — CBC WITH DIFFERENTIAL/PLATELET
Basophils Absolute: 0 10*3/uL (ref 0.0–0.1)
Basophils Relative: 0.4 % (ref 0.0–3.0)
Eosinophils Absolute: 0.1 10*3/uL (ref 0.0–0.7)
Eosinophils Relative: 0.6 % (ref 0.0–5.0)
HCT: 29.6 % — ABNORMAL LOW (ref 39.0–52.0)
Hemoglobin: 9.7 g/dL — ABNORMAL LOW (ref 13.0–17.0)
Lymphocytes Relative: 35.1 % (ref 12.0–46.0)
Lymphs Abs: 3.4 10*3/uL (ref 0.7–4.0)
MCHC: 32.7 g/dL (ref 30.0–36.0)
MCV: 90.5 fl (ref 78.0–100.0)
Monocytes Absolute: 0.8 10*3/uL (ref 0.1–1.0)
Monocytes Relative: 8.3 % (ref 3.0–12.0)
Neutro Abs: 5.4 10*3/uL (ref 1.4–7.7)
Neutrophils Relative %: 55.6 % (ref 43.0–77.0)
Platelets: 206 10*3/uL (ref 150.0–400.0)
RBC: 3.27 Mil/uL — ABNORMAL LOW (ref 4.22–5.81)
RDW: 15 % (ref 11.5–15.5)
WBC: 9.7 10*3/uL (ref 4.0–10.5)

## 2019-05-29 LAB — MAGNESIUM: Magnesium: 2.1 mg/dL (ref 1.5–2.5)

## 2019-05-29 LAB — IBC + FERRITIN
Ferritin: 99.3 ng/mL (ref 22.0–322.0)
Iron: 41 ug/dL — ABNORMAL LOW (ref 42–165)
Saturation Ratios: 16.5 % — ABNORMAL LOW (ref 20.0–50.0)
Transferrin: 177 mg/dL — ABNORMAL LOW (ref 212.0–360.0)

## 2019-06-12 ENCOUNTER — Telehealth: Payer: Self-pay

## 2019-06-12 ENCOUNTER — Emergency Department (HOSPITAL_COMMUNITY): Payer: Medicare Other

## 2019-06-12 ENCOUNTER — Emergency Department (HOSPITAL_COMMUNITY)
Admission: EM | Admit: 2019-06-12 | Discharge: 2019-06-12 | Disposition: A | Discharge status: Home / Self Care | Payer: Medicare Other | Attending: Emergency Medicine | Admitting: Emergency Medicine

## 2019-06-12 ENCOUNTER — Other Ambulatory Visit: Payer: Self-pay

## 2019-06-12 DIAGNOSIS — G9389 Other specified disorders of brain: Secondary | ICD-10-CM | POA: Diagnosis not present

## 2019-06-12 DIAGNOSIS — Z7982 Long term (current) use of aspirin: Secondary | ICD-10-CM | POA: Insufficient documentation

## 2019-06-12 DIAGNOSIS — Z79899 Other long term (current) drug therapy: Secondary | ICD-10-CM | POA: Diagnosis not present

## 2019-06-12 DIAGNOSIS — F17210 Nicotine dependence, cigarettes, uncomplicated: Secondary | ICD-10-CM | POA: Insufficient documentation

## 2019-06-12 DIAGNOSIS — R0902 Hypoxemia: Secondary | ICD-10-CM | POA: Diagnosis not present

## 2019-06-12 DIAGNOSIS — E1122 Type 2 diabetes mellitus with diabetic chronic kidney disease: Secondary | ICD-10-CM | POA: Diagnosis not present

## 2019-06-12 DIAGNOSIS — I509 Heart failure, unspecified: Secondary | ICD-10-CM | POA: Diagnosis not present

## 2019-06-12 DIAGNOSIS — N189 Chronic kidney disease, unspecified: Secondary | ICD-10-CM | POA: Diagnosis not present

## 2019-06-12 DIAGNOSIS — N390 Urinary tract infection, site not specified: Secondary | ICD-10-CM | POA: Insufficient documentation

## 2019-06-12 DIAGNOSIS — M791 Myalgia, unspecified site: Secondary | ICD-10-CM | POA: Diagnosis present

## 2019-06-12 DIAGNOSIS — I130 Hypertensive heart and chronic kidney disease with heart failure and stage 1 through stage 4 chronic kidney disease, or unspecified chronic kidney disease: Secondary | ICD-10-CM | POA: Insufficient documentation

## 2019-06-12 DIAGNOSIS — E876 Hypokalemia: Secondary | ICD-10-CM | POA: Diagnosis not present

## 2019-06-12 DIAGNOSIS — I517 Cardiomegaly: Secondary | ICD-10-CM | POA: Diagnosis not present

## 2019-06-12 DIAGNOSIS — F0390 Unspecified dementia without behavioral disturbance: Secondary | ICD-10-CM | POA: Diagnosis not present

## 2019-06-12 DIAGNOSIS — Z95810 Presence of automatic (implantable) cardiac defibrillator: Secondary | ICD-10-CM | POA: Diagnosis not present

## 2019-06-12 DIAGNOSIS — J449 Chronic obstructive pulmonary disease, unspecified: Secondary | ICD-10-CM | POA: Diagnosis not present

## 2019-06-12 DIAGNOSIS — R531 Weakness: Secondary | ICD-10-CM | POA: Diagnosis not present

## 2019-06-12 DIAGNOSIS — I2510 Atherosclerotic heart disease of native coronary artery without angina pectoris: Secondary | ICD-10-CM | POA: Diagnosis not present

## 2019-06-12 LAB — CUP PACEART INCLINIC DEVICE CHECK
Brady Statistic AP VP Percent: 0 %
Brady Statistic AP VS Percent: 0 %
Brady Statistic AS VP Percent: 100 %
Brady Statistic AS VS Percent: 0 %
Date Time Interrogation Session: 20210203084843
Implantable Lead Implant Date: 20080513
Implantable Lead Implant Date: 20080513
Implantable Lead Implant Date: 20080513
Implantable Lead Location: 753858
Implantable Lead Location: 753859
Implantable Lead Location: 753860
Implantable Lead Model: 4193
Implantable Lead Model: 5076
Implantable Lead Model: 6947
Implantable Pulse Generator Implant Date: 20141029
Lead Channel Pacing Threshold Amplitude: 0.75 V
Lead Channel Pacing Threshold Amplitude: 4.9 V
Lead Channel Pacing Threshold Pulse Width: 0.3 ms
Lead Channel Pacing Threshold Pulse Width: 0.8 ms
Lead Channel Sensing Intrinsic Amplitude: 0.4 mV
Lead Channel Sensing Intrinsic Amplitude: 0.75 mV
Lead Channel Setting Pacing Amplitude: 2.5 V
Lead Channel Setting Pacing Amplitude: 2.5 V
Lead Channel Setting Pacing Pulse Width: 0.4 ms
Lead Channel Setting Pacing Pulse Width: 0.8 ms
Lead Channel Setting Sensing Sensitivity: 0.3 mV

## 2019-06-12 LAB — URINALYSIS, ROUTINE W REFLEX MICROSCOPIC
Bilirubin Urine: NEGATIVE
Glucose, UA: NEGATIVE mg/dL
Ketones, ur: NEGATIVE mg/dL
Nitrite: NEGATIVE
Protein, ur: NEGATIVE mg/dL
Specific Gravity, Urine: 1.016 (ref 1.005–1.030)
pH: 6 (ref 5.0–8.0)

## 2019-06-12 LAB — COMPREHENSIVE METABOLIC PANEL
ALT: 19 U/L (ref 0–44)
AST: 16 U/L (ref 15–41)
Albumin: 2.9 g/dL — ABNORMAL LOW (ref 3.5–5.0)
Alkaline Phosphatase: 97 U/L (ref 38–126)
Anion gap: 10 (ref 5–15)
BUN: 10 mg/dL (ref 8–23)
CO2: 26 mmol/L (ref 22–32)
Calcium: 8.8 mg/dL — ABNORMAL LOW (ref 8.9–10.3)
Chloride: 101 mmol/L (ref 98–111)
Creatinine, Ser: 0.96 mg/dL (ref 0.61–1.24)
GFR calc Af Amer: >60 mL/min (ref >60)
GFR calc non Af Amer: >60 mL/min (ref >60)
Glucose, Bld: 110 mg/dL — ABNORMAL HIGH (ref 70–99)
Potassium: 3.1 mmol/L — ABNORMAL LOW (ref 3.5–5.1)
Sodium: 137 mmol/L (ref 135–145)
Total Bilirubin: 1 mg/dL (ref 0.3–1.2)
Total Protein: 6 g/dL — ABNORMAL LOW (ref 6.5–8.1)

## 2019-06-12 LAB — MAGNESIUM: Magnesium: 1.9 mg/dL (ref 1.7–2.4)

## 2019-06-12 LAB — CBC WITH DIFFERENTIAL/PLATELET
Abs Immature Granulocytes: 0.04 10*3/uL (ref 0.00–0.07)
Basophils Absolute: 0 10*3/uL (ref 0.0–0.1)
Basophils Relative: 0 %
Eosinophils Absolute: 0 10*3/uL (ref 0.0–0.5)
Eosinophils Relative: 0 %
HCT: 31.4 % — ABNORMAL LOW (ref 39.0–52.0)
Hemoglobin: 10.2 g/dL — ABNORMAL LOW (ref 13.0–17.0)
Immature Granulocytes: 1 %
Lymphocytes Relative: 33 %
Lymphs Abs: 2.9 10*3/uL (ref 0.7–4.0)
MCH: 29.8 pg (ref 26.0–34.0)
MCHC: 32.5 g/dL (ref 30.0–36.0)
MCV: 91.8 fL (ref 80.0–100.0)
Monocytes Absolute: 0.6 10*3/uL (ref 0.1–1.0)
Monocytes Relative: 7 %
Neutro Abs: 5.3 10*3/uL (ref 1.7–7.7)
Neutrophils Relative %: 59 %
Platelets: 230 10*3/uL (ref 150–400)
RBC: 3.42 MIL/uL — ABNORMAL LOW (ref 4.22–5.81)
RDW: 13.3 % (ref 11.5–15.5)
WBC: 8.8 10*3/uL (ref 4.0–10.5)
nRBC: 0 % (ref 0.0–0.2)

## 2019-06-12 MED ORDER — POTASSIUM CHLORIDE CRYS ER 20 MEQ PO TBCR
40.0000 meq | EXTENDED_RELEASE_TABLET | Freq: Once | ORAL | Status: AC
  Administered 2019-06-12: 40 meq via ORAL
  Filled 2019-06-12: qty 2

## 2019-06-12 MED ORDER — CEPHALEXIN 500 MG PO CAPS: 500.0000 mg | ORAL_CAPSULE | Freq: Two times a day (BID) | ORAL | 0 refills | Status: AC

## 2019-06-12 NOTE — ED Provider Notes (Signed)
[facility] EMERGENCY DEPARTMENT Provider Note   CSN: [id number] Arrival date & time: 06/12/19  1348     History Chief Complaint  Patient presents with  . Failure To Thrive    [name] is a 86 y.o. male with a past medical history of A. fib, CAD, CHF, CKD, GERD, dementia presenting to the ED for 1 month history of failure to thrive.  Daughter provides history over the phone.  States that patient resides at home by himself but she lives next door to him and he has home health aides that will assist him.  Has noticed for the past month he has not been wanting to eat or drink anything, complaining of "hurting all over" and she is concerned that he is dehydrated.  Also states that he has been more agitated and "talking out of his head" over the past week or so.  He is scheduled to meet with his PCP tomorrow but was told to come to the ER due to his ongoing symptoms.  He has not specifically had any chest pain, shortness of breath, vomiting, injuries or falls that daughter is aware of.  Patient states that he feels "fine, I think I just want to go home."  HPI     Past Medical History:  Diagnosis Date  . AICD (automatic cardioverter/defibrillator) present   . Anemia    iron deficiency anemia with previous severe GI bleed   . Arthritis    back and knees  . Atrial fibrillation ([place])    s/p AV node ablation; not on coumadin due to GIB  . Blood clot in spinal cord artery ([place]) 2011   s/p ACDF  . CAD (coronary artery disease)    Mild, nonobstructive (LHC 1/07: mLAD 20%, pCFX 20-30%, mRCA 30%, EF 25%)  . Cancer [facility])    prostate  . Cardiac arrest - ventricular fibrillation 02/2008   Aborted, shocked by ICD  . Cervical radiculopathy    left  . CHF (congestive heart failure) (HCC)    secondary to nonischemic cardiomyopathy; ECHO 10/07 EF 20-25%, mod to severe MR; ECHO 1/10 EF 55-60%, mild MR; ECHO 2/11 55-65%, grade 1 diast dysfxn, miod to mod LAE, mild TR;    S/P  Medtronic BiV ICD with biventricular function now turned off due to diahragmatic simulation  . Chronic kidney disease   . Chronic neck pain   . COPD (chronic obstructive pulmonary disease) (HCC)    GOLD II; Spirometry 07/10/2008 >FEV1 1.46 56% predicted, ratio of 66%  . Dementia ([place])   . Diabetes mellitus    type 2 NIDDM x 5-6 yrs  . Dyspnea    exertional  . GERD (gastroesophageal reflux disease)   . Hyperlipidemia   . Hypertension   . Inguinal hernia    left; asymptomatic  . Left bundle branch block   . Medical history non-contributory   . Obesity   . Pneumonia    01/2011  . Presence of permanent cardiac pacemaker   . Prostatic hypertrophy 09-11-97   Benign  . Torticollis     Patient Active Problem List   Diagnosis Date Noted  . Accidental fall 05/23/2019  . Acute pain of left shoulder due to trauma 05/23/2019  . Abrasion of left shoulder 05/23/2019  . Weakness 05/23/2019  . Pain of right thumb 04/05/2017  . Medicare annual wellness visit, subsequent 03/30/2017  . Need for influenza vaccination 03/30/2017  . Need for vaccine for DT (diphtheria-tetanus) 03/30/2017  . Hemoptysis 08/17/2016  .  Iron deficiency anemia 01/25/2016  . Decreased mobility 03/14/2015  . Counseling regarding end of life decision making 02/28/2015  . Liver lesion 09/27/2014  . Type 2 diabetes mellitus with diabetic neuropathy ([place])   . Chronic neck pain 08/28/2014  . Allergic rhinitis 06/22/2014  . Spinal stenosis of lumbar region 01/19/2014  . Moderate dementia without behavioral disturbance ([place]) 12/22/2013  . Essential hypertension, benign 02/28/2013  . Acute gouty arthritis 02/28/2013  . B12 deficiency 06/03/2012  . Polymorphic ventricular tachycardia ([place]) 05/25/2012  . Biventricular implantable cardioverter-defibrillator Medtronic 05/18/2011  . Chronic diastolic heart failure ([place]) 11/19/2010  . Atrial fibrillation ([employer]) 11/19/2010  . Leg pain, bilateral 11/05/2010  . Chronic  constipation 03/28/2010  . PERIPHERAL NEUROPATHY 02/14/2010  . Vitamin D deficiency 01/22/2010  . TOBACCO ABUSE 11/07/2008  . ADENOCARCINOMA, PROSTATE 10/10/2008  . Ventricular fibrillation ([place]) 07/03/2008  . CAROTIDYNIA 10/21/2006  . Hyperlipidemia 06/30/2006  . Anemia, iron deficiency 06/30/2006  . COPD, moderate ([place]) 06/30/2006  . BENIGN PROSTATIC HYPERTROPHY 06/30/2006  . PNEUMONIA, HX OF 06/30/2006  . ABSCESS, PERIRECTAL, HX OF 06/30/2006  . Pulmonary nodules 09/11/1997    Past Surgical History:  Procedure Laterality Date  . ANTERIOR CERVICAL DISCECTOMY  02/2010  . BIV ICD GENERTAOR CHANGE OUT N/A 02/08/2013   Procedure: BIV ICD GENERTAOR CHANGE OUT;  Surgeon: Deboraha Sprang, MD;  Location: [facility] CATH LAB;  Service: Cardiovascular;  Laterality: N/A;  . CARDIAC CATHETERIZATION  01/2006   Showed mild nonobstructive CAD  . CARDIAC CATHETERIZATION  11/05/2006   Right atrial pressure mean of 12, RV pressure 36/8, PA pressure 39/16 witha mean of 28, wedge pressure was 20. Fick cardiac output was 5 liters per minute, cardiac index was 2.4  . COLONOSCOPY  06/26/2002   Multip (neg) divertics, int hemm  . COLONOSCOPY  07/10/2004   Poyps, divertics, int hemms  . COLONOSCOPY  06/19/2008   2 polyps divertics int hemms (Dr Henrene Pastor)  . COLONOSCOPY W/ POLYPECTOMY  11/1997   Divertics, int hemms  . CORONARY ANGIOPLASTY     Min abstrut zd severe LB dystn EF 25%  . CT HEAD LIMITED W/O CM  10/03/2006   No acute abnmlty  . ESOPHAGOGASTRODUODENOSCOPY  09/1997   Prepylor ulcer, esoph ring, duod avm  . ESOPHAGOGASTRODUODENOSCOPY  05/22/2002   Poss Barrett's   . ESOPHAGOGASTRODUODENOSCOPY  02/19/2006   HH No active bleeding  . EYE SURGERY     cataract, bilateral  . HOSP  8/14-8/23/2008   Acute on chronis CHF IIIB [place] Cardiomyop EF 20-25% Mod-Sev MR  . INSERT / REPLACE / REMOVE PACEMAKER  2007  . KNEE ARTHROSCOPY  1988  . KNEE SURGERY  1996   Left  . LOBECTOMY  01-21-98   lung  .  LUMBAR LAMINECTOMY/DECOMPRESSION MICRODISCECTOMY  04/29/2011   Procedure: LUMBAR LAMINECTOMY/DECOMPRESSION MICRODISCECTOMY;  Surgeon: Eustace Moore, MD;  Location: [facility];  Service: Neurosurgery;  Laterality: N/A;  Lumbar Two, Three, Four, Five Decompressive Lumbar Laminectomies  . MCH GI BLEED  02/07-02/12/2002  . MCH SOB  10/11-10/18/2007   A fib, CHF  . MCH x  11/08-11/03/2006   Acute blood loss, anemia, sys HF, isch cardiomyopathy  . ROTATOR CUFF REPAIR  1994 and 1995       Family History  Problem Relation Age of Onset  . Heart failure Mother        CHF  . Heart failure Father        CHF  . Hypertension Sister   . Obesity Sister   .  Cancer Brother        lung cancer  . Lung cancer Brother   . Liver disease Brother        ETOH  . Alcohol abuse Brother   . Cancer Brother   . COPD Brother   . Cancer Brother   . Anxiety disorder Brother   . COPD Brother   . Hypertension Sister     Social History   Tobacco Use  . Smoking status: Current Every Day Smoker    Packs/day: 1.00    Years: 60.00    Pack years: 60.00    Types: Cigarettes  . Smokeless tobacco: Never Used  . Tobacco comment: Smoker since 65, quit for 2 years and started back in 03/2008  Substance Use Topics  . Alcohol use: Yes    Alcohol/week: 0.0 standard drinks    Comment: rarely  . Drug use: No    Home Medications Prior to Admission medications   Medication Sig Start Date End Date Taking? Authorizing Provider  acetaminophen (TYLENOL) 500 MG tablet Take 500 mg by mouth every 6 (six) hours as needed for mild pain.     [provider]  albuterol (VENTOLIN HFA) 108 (90 Base) MCG/ACT inhaler USE 2 INHALATIONS ORALLY EVERY 6 HOURS AS NEEDED FOR SHORTNESS OF BREATH OR WHEEZING 09/26/18   Bedsole, Amy E, MD  allopurinol (ZYLOPRIM) 100 MG tablet Take 1 tablet (100 mg total) by mouth daily. 11/28/18   Bedsole, Amy E, MD  aspirin EC 81 MG tablet Take 81 mg by mouth 2 (two) times daily.    [provider]  atorvastatin (LIPITOR) 20 MG tablet TAKE 1 TABLET AT BEDTIME 10/21/18   Bedsole, Amy E, MD  blood glucose meter kit and supplies Dispense based on patient and insurance preference. Use to check blood sugar daily. 09/01/18   Bedsole, Amy E, MD  carvedilol (COREG) 3.125 MG tablet Take 1 tablet (3.125 mg total) by mouth 2 (two) times daily with a meal. 04/13/19   Bedsole, Amy E, MD  cephALEXin (KEFLEX) 500 MG capsule Take 1 capsule (500 mg total) by mouth 2 (two) times daily for 7 days. 06/12/19 06/19/19  Arlette Schaad, Nicanor Alcon, PA-C  Cholecalciferol (TH VITAMIN D3) 2000 UNITS CAPS Take 2,000 Units by mouth daily.     [provider]  DEXILANT 60 MG capsule TAKE 1 CAPSULE DAILY 04/10/16   Bedsole, Amy E, MD  Docusate Calcium (STOOL SOFTENER PO) Take 100 tablets by mouth as needed.    [provider]  donepezil (ARICEPT) 10 MG tablet TAKE 1 TABLET AT BEDTIME 01/05/19   Bedsole, Amy E, MD  dutasteride (AVODART) 0.5 MG capsule TAKE 1 CAPSULE DAILY 11/15/18   Bedsole, Amy E, MD  eplerenone (INSPRA) 25 MG tablet TAKE 1/2 TABLET (12.5 MG TOTAL) DAILY (NEED APPOINTMENT) 01/23/19   Bensimhon, Shaune Pascal, MD  furosemide (LASIX) 40 MG tablet TAKE 1 TABLET DAILY 01/23/19   Bedsole, Amy E, MD  gabapentin (NEURONTIN) 300 MG capsule Take 1 capsule (300 mg total) by mouth 2 (two) times daily. 04/13/19   Bedsole, Amy E, MD  guaiFENesin (MUCINEX) 600 MG 12 hr tablet Take 1,200 mg by mouth 2 (two) times daily as needed for cough.     [provider]  KLOR-CON M20 20 MEQ tablet TAKE 1 TABLET TWICE A DAY 01/05/19   Bedsole, Amy E, MD  linaclotide (LINZESS) 290 MCG CAPS capsule Take 1 capsule (290 mcg total) by mouth daily before breakfast. 09/01/18   Diona Browner, Amy  E, MD  lisinopril (ZESTRIL) 2.5 MG tablet TAKE 1 TABLET DAILY 10/07/18   Bedsole, Amy E, MD  SPIRIVA HANDIHALER 18 MCG inhalation capsule INHALE THE CONTENTS OF ONE CAPSULE DAILY IN THE       MORNING VIA HANDIHALER     DEVICE 04/19/18   Bedsole,  Amy E, MD  tamsulosin (FLOMAX) 0.4 MG CAPS capsule TAKE 1 CAPSULE DAILY AFTER SUPPER 02/21/19   Bedsole, Amy E, MD    Allergies    Patient has no known allergies.  Review of Systems   Review of Systems  Unable to perform ROS: Dementia    Physical Exam Updated Vital Signs BP (!) 144/81 (BP Location: Right Arm)   Pulse 74   Temp 97.7 F (36.5 C) (Oral)   Resp 18   Ht _0  (1.651 m)   Wt 60 kg   SpO2 100%   BMI 22.01 kg/m   Physical Exam Vitals and nursing note reviewed.  Constitutional:      General: He is not in acute distress.    Appearance: He is well-developed.  HENT:     Head: Normocephalic and atraumatic.     Nose: Nose normal.  Eyes:     General: No scleral icterus.       Right eye: No discharge.        Left eye: No discharge.     Conjunctiva/sclera: Conjunctivae normal.     Pupils: Pupils are equal, round, and reactive to light.  Cardiovascular:     Rate and Rhythm: Normal rate and regular rhythm.     Heart sounds: Normal heart sounds. No murmur. No friction rub. No gallop.   Pulmonary:     Effort: Pulmonary effort is normal. No respiratory distress.     Breath sounds: Normal breath sounds.  Abdominal:     General: Bowel sounds are normal. There is no distension.     Palpations: Abdomen is soft.     Tenderness: There is no abdominal tenderness. There is no guarding.  Musculoskeletal:        General: Normal range of motion.     Cervical back: Normal range of motion and neck supple.  Skin:    General: Skin is warm and dry.     Findings: No rash.  Neurological:     General: No focal deficit present.     Mental Status: He is alert.     Cranial Nerves: No cranial nerve deficit.     Sensory: No sensory deficit.     Motor: No weakness or abnormal muscle tone.     Coordination: Coordination normal.     ED Results / Procedures / Treatments   Labs (all labs ordered are listed, but only abnormal results are displayed) Labs Reviewed  COMPREHENSIVE  METABOLIC PANEL - Abnormal; Notable for the following components:      Result Value   Potassium 3.1 (*)    Glucose, Bld 110 (*)    Calcium 8.8 (*)    Total Protein 6.0 (*)    Albumin 2.9 (*)    All other components within normal limits  CBC WITH DIFFERENTIAL/PLATELET - Abnormal; Notable for the following components:   RBC 3.42 (*)    Hemoglobin 10.2 (*)    HCT 31.4 (*)    All other components within normal limits  URINALYSIS, ROUTINE W REFLEX MICROSCOPIC - Abnormal; Notable for the following components:   Hgb urine dipstick SMALL (*)    Leukocytes,Ua SMALL (*)    Bacteria, UA RARE (*)  All other components within normal limits  URINE CULTURE  MAGNESIUM    EKG None  Radiology DG Chest 2 View  Result Date: 06/12/2019 CLINICAL DATA:  86 year old male with altered mental status. EXAM: CHEST - 2 VIEW COMPARISON:  Chest radiograph dated 08/17/2016. FINDINGS: Faint density in the right upper lobe, likely atelectatic changes. No focal consolidation, pleural effusion, pneumothorax. There is mild cardiomegaly. Atherosclerotic calcification of the aorta. Left pectoral AICD device. No acute osseous pathology. IMPRESSION: 1. No acute cardiopulmonary process. 2. Mild cardiomegaly. Electronically Signed   By: Anner Crete M.D.   On: 06/12/2019 15:33   CT Head Wo Contrast  Result Date: 06/12/2019 CLINICAL DATA:  Not eating or drinking.  Dehydration. EXAM: CT HEAD WITHOUT CONTRAST TECHNIQUE: Contiguous axial images were obtained from the base of the skull through the vertex without intravenous contrast. COMPARISON:  CT head 01/25/2016 FINDINGS: Brain: Moderate to advanced atrophy and ventricular enlargement with mild progression. Negative for acute infarct, hemorrhage, mass. No fluid collection or midline shift. Vascular: Negative for hyperdense vessel. Skull: Negative Sinuses/Orbits: Paranasal sinuses clear.  Bilateral ocular surgery Other: None IMPRESSION: Moderate to advanced atrophy with  interval progression since 2017. No acute abnormality. Electronically Signed   By: Franchot Gallo M.D.   On: 06/12/2019 15:44    Procedures Procedures (including critical care time)  Medications Ordered in ED Medications  potassium chloride SA (KLOR-CON) CR tablet 40 mEq (40 mEq Oral Given 06/12/19 1626)    ED Course  I have reviewed the triage vital signs and the nursing notes.  Pertinent labs & imaging results that were available during my care of the patient were reviewed by me and considered in my medical decision making (see chart for details).    MDM Rules/Calculators/A&P                      86 year old male with a past medical history of A. fib, CAD, CHF, CKD, dementia presents to the ED for 1 month history of failure to thrive.  Daughter states that patient has had progressive worsening mental status, decreased appetite and myalgias.  States that he has been more agitated as well.  Resides at home by himself.  He denies any chest pain, abdominal pain.  States that he would like to go home.  On exam abdomen is soft, nontender nondistended.  No facial asymmetry noted.  He is oriented to self.  Equal grip strength bilaterally.  Normal strength noted in bilateral lower extremities.  Work appears navigate for for potassium of 3.1, which was repleted oral.  CBC, magnesium normal.  Urinalysis with some leukocytes, pyuria and bacteria.  CT of the head is unremarkable.  Chest x-ray is unremarkable.  He denies any injuries or falls.  Will treat him for UTI at the request of family.  Otherwise do not feel that there are any other emergent cause of his symptoms that would need to be treated today.  He and daughter are both comfortable with discharge home and PCP follow-up.  Suspect that this could be partially due to the UTI but also due to worsening dementia.  Given strict return precautions.  Patient is hemodynamically stable, in NAD, and able to ambulate in the ED. Evaluation does not show  pathology that would require ongoing emergent intervention or inpatient treatment. I have personally reviewed and interpreted all lab work and imaging at today's ED visit. I explained the diagnosis to the patient. Pain has been managed and has no complaints prior  to discharge. Patient is comfortable with above plan and is stable for discharge at this time. All questions were answered prior to disposition. Strict return precautions for returning to the ED were discussed. Encouraged follow up with PCP.   An After Visit Summary was printed and given to the patient.   Portions of this note were generated with [employer]. Dictation errors may occur despite best attempts at proofreading.  Final Clinical Impression(s) / ED Diagnoses Final diagnoses:  Hypokalemia  Lower urinary tract infectious disease    Rx / DC Orders ED Discharge Orders         Ordered    cephALEXin (KEFLEX) 500 MG capsule  2 times daily     06/12/19 1910           Delia Heady, PA-C 06/12/19 1916    Sherwood Gambler, MD 06/13/19 [phone]

## 2019-06-12 NOTE — Telephone Encounter (Signed)
Agree - could be something simple like a UTI or something else easily fixed

## 2019-06-12 NOTE — Telephone Encounter (Signed)
Patient's daughter contacted the office asking that an urgent referral be placed to [facility]. The patient has an appt with Dr. Diona Browner tomorrow to discuss all of this, but the patient's daughter states that last night and today, the patient has been very agitated and combative. She states that the patient keeps taking his pull up off and wants to lay naked on the couch, and when she and her mom try to get him dressed, he becomes agitated. She states that last night was the same way almost all night and it is very rough on her and her mom.  She would like to see if another physician can sign off on an urgent referral today to help get this process in the works as soon as possible?  Dr. Lorelei Pont, is this something you can help with since Dr. Diona Browner is not in the office today?

## 2019-06-12 NOTE — Telephone Encounter (Addendum)
[name] notified as instructed by telephone.   She will take her dad to the ER for evaluation.

## 2019-06-12 NOTE — ED Triage Notes (Addendum)
From home. Per family, pt hasn't been eating/drinking for the last 2 days and the pt's cardiologist wanted him to be evaluated for dehydration. Pt denies complaints at this time, states he "did not want to come." Per family, pt is @ baseline neuro status. Denies pain, fever, cough. States he "just isn't hungry."

## 2019-06-12 NOTE — Telephone Encounter (Signed)
This is unusual behavior for him... I am concerned about the decline in  mental status. He should have ER visit to evaluate ASAP.

## 2019-06-12 NOTE — Discharge Instructions (Signed)
Have your primary care provider recheck your potassium level in 1 week. Take the antibiotics as directed. Return to the ED if you start to have worsening symptoms, chest pain, shortness of breath, headache, vision changes, numbness in arms or legs.

## 2019-06-13 ENCOUNTER — Ambulatory Visit: Payer: Medicare Other | Admitting: Family Medicine

## 2019-06-13 LAB — URINE CULTURE: Culture: NO GROWTH

## 2019-06-14 ENCOUNTER — Ambulatory Visit (INDEPENDENT_AMBULATORY_CARE_PROVIDER_SITE_OTHER): Payer: Medicare Other | Admitting: *Deleted

## 2019-06-14 DIAGNOSIS — Z95810 Presence of automatic (implantable) cardiac defibrillator: Secondary | ICD-10-CM

## 2019-06-14 LAB — CUP PACEART REMOTE DEVICE CHECK
Battery Remaining Longevity: 1 mo — CL
Battery Voltage: 2.7 V
Brady Statistic AP VP Percent: 0 %
Brady Statistic AP VS Percent: 0 %
Brady Statistic AS VP Percent: 0 %
Brady Statistic AS VS Percent: 0 %
Brady Statistic RA Percent Paced: 0 %
Brady Statistic RV Percent Paced: 93.05 %
Date Time Interrogation Session: 20210303001703
HighPow Impedance: 41 Ohm
HighPow Impedance: 52 Ohm
Implantable Lead Implant Date: 20080513
Implantable Lead Implant Date: 20080513
Implantable Lead Implant Date: 20080513
Implantable Lead Location: 753858
Implantable Lead Location: 753859
Implantable Lead Location: 753860
Implantable Lead Model: 4193
Implantable Lead Model: 5076
Implantable Lead Model: 6947
Implantable Pulse Generator Implant Date: 20141029
Lead Channel Impedance Value: 4047 Ohm
Lead Channel Impedance Value: 4047 Ohm
Lead Channel Impedance Value: 437 Ohm
Lead Channel Impedance Value: 437 Ohm
Lead Channel Impedance Value: 437 Ohm
Lead Channel Impedance Value: 532 Ohm
Lead Channel Pacing Threshold Amplitude: 0.5 V
Lead Channel Pacing Threshold Amplitude: 1.625 V
Lead Channel Pacing Threshold Pulse Width: 0.4 ms
Lead Channel Pacing Threshold Pulse Width: 0.8 ms
Lead Channel Sensing Intrinsic Amplitude: 0.375 mV
Lead Channel Sensing Intrinsic Amplitude: 0.375 mV
Lead Channel Sensing Intrinsic Amplitude: 4.25 mV
Lead Channel Sensing Intrinsic Amplitude: 4.25 mV
Lead Channel Setting Pacing Amplitude: 2.5 V
Lead Channel Setting Pacing Amplitude: 2.5 V
Lead Channel Setting Pacing Pulse Width: 0.4 ms
Lead Channel Setting Pacing Pulse Width: 0.8 ms
Lead Channel Setting Sensing Sensitivity: 0.3 mV

## 2019-06-14 NOTE — Progress Notes (Signed)
No ICM remote transmission received for 05/29/2019 and next ICM transmission scheduled for 07/05/2019.

## 2019-06-14 NOTE — Progress Notes (Signed)
ICD Remote  

## 2019-06-16 ENCOUNTER — Telehealth: Payer: Self-pay

## 2019-06-16 ENCOUNTER — Telehealth: Payer: Self-pay | Admitting: Internal Medicine

## 2019-06-16 NOTE — Telephone Encounter (Signed)
Notification received from front desk that pt daughter, [name] call.  Pt mental status has declined and she wishes to cancel all upcoming appointments including the device upgrade procedure scheduled for 3/22.  Pt daughter already off the phone.  Sending message to Dr. Olin Pia nurse and scheduling to inform.

## 2019-06-16 NOTE — Telephone Encounter (Signed)
Daughter, [name], requesting to cancel upcoming procedure on 07/03/19. They have decided not to follow through with the pacemaker insertion.

## 2019-06-19 ENCOUNTER — Telehealth: Payer: Self-pay | Admitting: Family Medicine

## 2019-06-19 DIAGNOSIS — F03B0 Unspecified dementia, moderate, without behavioral disturbance, psychotic disturbance, mood disturbance, and anxiety: Secondary | ICD-10-CM

## 2019-06-19 DIAGNOSIS — E1140 Type 2 diabetes mellitus with diabetic neuropathy, unspecified: Secondary | ICD-10-CM

## 2019-06-19 DIAGNOSIS — I4891 Unspecified atrial fibrillation: Secondary | ICD-10-CM

## 2019-06-19 DIAGNOSIS — R531 Weakness: Secondary | ICD-10-CM

## 2019-06-19 DIAGNOSIS — I10 Essential (primary) hypertension: Secondary | ICD-10-CM

## 2019-06-19 DIAGNOSIS — R2689 Other abnormalities of gait and mobility: Secondary | ICD-10-CM

## 2019-06-19 DIAGNOSIS — I5032 Chronic diastolic (congestive) heart failure: Secondary | ICD-10-CM

## 2019-06-19 DIAGNOSIS — F0390 Unspecified dementia without behavioral disturbance: Secondary | ICD-10-CM

## 2019-06-19 NOTE — Telephone Encounter (Signed)
Colletta [place] called stating Granddaughter Mel Almond called her wanting to get a stat hospice referral for pt. Pt has be declining agitated/restless   Wants to have pt admitted today if possible  They need order and HP and demographic before this can be done  Fax # [phone]

## 2019-06-19 NOTE — Telephone Encounter (Signed)
Urgent hospice referral places.. please let referral coordinator and family know.

## 2019-06-19 NOTE — Telephone Encounter (Signed)
Follow Up:   Daughter called back and wanted to make sure that procedure for 07-03-19 had been cancel.

## 2019-06-20 ENCOUNTER — Ambulatory Visit: Payer: Medicare Other | Admitting: Family Medicine

## 2019-06-20 DIAGNOSIS — F0390 Unspecified dementia without behavioral disturbance: Secondary | ICD-10-CM | POA: Diagnosis not present

## 2019-06-20 DIAGNOSIS — Z6822 Body mass index (BMI) 22.0-22.9, adult: Secondary | ICD-10-CM | POA: Diagnosis not present

## 2019-06-20 DIAGNOSIS — E785 Hyperlipidemia, unspecified: Secondary | ICD-10-CM | POA: Diagnosis not present

## 2019-06-20 DIAGNOSIS — I2510 Atherosclerotic heart disease of native coronary artery without angina pectoris: Secondary | ICD-10-CM | POA: Diagnosis not present

## 2019-06-20 DIAGNOSIS — M109 Gout, unspecified: Secondary | ICD-10-CM | POA: Diagnosis not present

## 2019-06-20 DIAGNOSIS — S41012D Laceration without foreign body of left shoulder, subsequent encounter: Secondary | ICD-10-CM | POA: Diagnosis not present

## 2019-06-20 DIAGNOSIS — E1122 Type 2 diabetes mellitus with diabetic chronic kidney disease: Secondary | ICD-10-CM | POA: Diagnosis not present

## 2019-06-20 DIAGNOSIS — R32 Unspecified urinary incontinence: Secondary | ICD-10-CM | POA: Diagnosis not present

## 2019-06-20 DIAGNOSIS — S81812D Laceration without foreign body, left lower leg, subsequent encounter: Secondary | ICD-10-CM | POA: Diagnosis not present

## 2019-06-20 DIAGNOSIS — I4891 Unspecified atrial fibrillation: Secondary | ICD-10-CM | POA: Diagnosis not present

## 2019-06-20 DIAGNOSIS — N400 Enlarged prostate without lower urinary tract symptoms: Secondary | ICD-10-CM | POA: Diagnosis not present

## 2019-06-20 DIAGNOSIS — R159 Full incontinence of feces: Secondary | ICD-10-CM | POA: Diagnosis not present

## 2019-06-20 DIAGNOSIS — S41112D Laceration without foreign body of left upper arm, subsequent encounter: Secondary | ICD-10-CM | POA: Diagnosis not present

## 2019-06-20 DIAGNOSIS — N189 Chronic kidney disease, unspecified: Secondary | ICD-10-CM | POA: Diagnosis not present

## 2019-06-20 DIAGNOSIS — I5032 Chronic diastolic (congestive) heart failure: Secondary | ICD-10-CM | POA: Diagnosis not present

## 2019-06-20 DIAGNOSIS — F17210 Nicotine dependence, cigarettes, uncomplicated: Secondary | ICD-10-CM | POA: Diagnosis not present

## 2019-06-20 DIAGNOSIS — S51812D Laceration without foreign body of left forearm, subsequent encounter: Secondary | ICD-10-CM | POA: Diagnosis not present

## 2019-06-20 DIAGNOSIS — Z95810 Presence of automatic (implantable) cardiac defibrillator: Secondary | ICD-10-CM | POA: Diagnosis not present

## 2019-06-20 DIAGNOSIS — I130 Hypertensive heart and chronic kidney disease with heart failure and stage 1 through stage 4 chronic kidney disease, or unspecified chronic kidney disease: Secondary | ICD-10-CM | POA: Diagnosis not present

## 2019-06-20 NOTE — Telephone Encounter (Signed)
Received return phone call from pt's daughter and advised per her request pt's procedure has been canceled.  Daughter verbalizes understanding and agrees with current plan.

## 2019-06-20 NOTE — Telephone Encounter (Signed)
Attempted phone call to pt's daughter. Left voicemail message to contact RN at [phone].  Pt's procedure for 07/03/2019 has been canceled as requested by her daughter

## 2019-06-21 DIAGNOSIS — I130 Hypertensive heart and chronic kidney disease with heart failure and stage 1 through stage 4 chronic kidney disease, or unspecified chronic kidney disease: Secondary | ICD-10-CM | POA: Diagnosis not present

## 2019-06-21 DIAGNOSIS — E1122 Type 2 diabetes mellitus with diabetic chronic kidney disease: Secondary | ICD-10-CM | POA: Diagnosis not present

## 2019-06-21 DIAGNOSIS — N189 Chronic kidney disease, unspecified: Secondary | ICD-10-CM | POA: Diagnosis not present

## 2019-06-21 DIAGNOSIS — I4891 Unspecified atrial fibrillation: Secondary | ICD-10-CM | POA: Diagnosis not present

## 2019-06-21 DIAGNOSIS — I5032 Chronic diastolic (congestive) heart failure: Secondary | ICD-10-CM | POA: Diagnosis not present

## 2019-06-21 DIAGNOSIS — I2510 Atherosclerotic heart disease of native coronary artery without angina pectoris: Secondary | ICD-10-CM | POA: Diagnosis not present

## 2019-06-22 DIAGNOSIS — I5032 Chronic diastolic (congestive) heart failure: Secondary | ICD-10-CM | POA: Diagnosis not present

## 2019-06-22 DIAGNOSIS — E1122 Type 2 diabetes mellitus with diabetic chronic kidney disease: Secondary | ICD-10-CM | POA: Diagnosis not present

## 2019-06-22 DIAGNOSIS — N189 Chronic kidney disease, unspecified: Secondary | ICD-10-CM | POA: Diagnosis not present

## 2019-06-22 DIAGNOSIS — I130 Hypertensive heart and chronic kidney disease with heart failure and stage 1 through stage 4 chronic kidney disease, or unspecified chronic kidney disease: Secondary | ICD-10-CM | POA: Diagnosis not present

## 2019-06-22 DIAGNOSIS — I4891 Unspecified atrial fibrillation: Secondary | ICD-10-CM | POA: Diagnosis not present

## 2019-06-22 DIAGNOSIS — I2510 Atherosclerotic heart disease of native coronary artery without angina pectoris: Secondary | ICD-10-CM | POA: Diagnosis not present

## 2019-06-23 ENCOUNTER — Telehealth: Payer: Self-pay | Admitting: Internal Medicine

## 2019-06-23 NOTE — Telephone Encounter (Signed)
Error- did not need this

## 2019-06-23 NOTE — Telephone Encounter (Signed)
The Hospice nurse called to have the pt ICD deactivated. I let her know we will need an order from the hospice doctor to deactivate the pt ICD. Once we receive the order we can contact [employer] and have a representative to deactivate the ICD. She states she will fax the doctor order to [place].

## 2019-06-26 DIAGNOSIS — N189 Chronic kidney disease, unspecified: Secondary | ICD-10-CM | POA: Diagnosis not present

## 2019-06-26 DIAGNOSIS — I4891 Unspecified atrial fibrillation: Secondary | ICD-10-CM | POA: Diagnosis not present

## 2019-06-26 DIAGNOSIS — I2510 Atherosclerotic heart disease of native coronary artery without angina pectoris: Secondary | ICD-10-CM | POA: Diagnosis not present

## 2019-06-26 DIAGNOSIS — I130 Hypertensive heart and chronic kidney disease with heart failure and stage 1 through stage 4 chronic kidney disease, or unspecified chronic kidney disease: Secondary | ICD-10-CM | POA: Diagnosis not present

## 2019-06-26 DIAGNOSIS — I5032 Chronic diastolic (congestive) heart failure: Secondary | ICD-10-CM | POA: Diagnosis not present

## 2019-06-26 DIAGNOSIS — E1122 Type 2 diabetes mellitus with diabetic chronic kidney disease: Secondary | ICD-10-CM | POA: Diagnosis not present

## 2019-06-28 DIAGNOSIS — I130 Hypertensive heart and chronic kidney disease with heart failure and stage 1 through stage 4 chronic kidney disease, or unspecified chronic kidney disease: Secondary | ICD-10-CM | POA: Diagnosis not present

## 2019-06-28 DIAGNOSIS — E1122 Type 2 diabetes mellitus with diabetic chronic kidney disease: Secondary | ICD-10-CM | POA: Diagnosis not present

## 2019-06-28 DIAGNOSIS — I4891 Unspecified atrial fibrillation: Secondary | ICD-10-CM | POA: Diagnosis not present

## 2019-06-28 DIAGNOSIS — I5032 Chronic diastolic (congestive) heart failure: Secondary | ICD-10-CM | POA: Diagnosis not present

## 2019-06-28 DIAGNOSIS — N189 Chronic kidney disease, unspecified: Secondary | ICD-10-CM | POA: Diagnosis not present

## 2019-06-28 DIAGNOSIS — I2510 Atherosclerotic heart disease of native coronary artery without angina pectoris: Secondary | ICD-10-CM | POA: Diagnosis not present

## 2019-06-28 NOTE — Telephone Encounter (Signed)
[name] with hospice called. She reports that the the pt and his family have not heard back about ICD deactivation. Explained that we are still awaiting an order to discontinue therapies. [name] reports that hospice MD will write order and she will fax it to our office. Fax number provided for DC copy. Discussed magnet use if death is imminent. Will arrange [employer] representative to be present for ICD deactivation at pt's home, and [name] or another hospice nurse will also need to be present. Teresa's number is [phone].   She will be out of town Mon-Wed of next week. Requests call to main [facility] number at [phone] if arranging appointment next week. [place], [place].  Spoke with [name], patient's daughter (DPR). Confirmed that family wishes to deactivate ICD therapies, explained that pacemaker function will not be affected. [name] reports pt is sleeping most of the time and is not speaking or interacting much. Explained we are currently waiting on an MD order, but will reach out as soon as order is received. [name] in agreement with plan and appreciative of call.  Routed to Dr. Caryl Comes as an Juluis Rainier.

## 2019-06-29 ENCOUNTER — Other Ambulatory Visit (HOSPITAL_COMMUNITY): Payer: Medicare Other

## 2019-06-29 ENCOUNTER — Other Ambulatory Visit: Payer: Medicare Other

## 2019-06-29 DIAGNOSIS — I5032 Chronic diastolic (congestive) heart failure: Secondary | ICD-10-CM | POA: Diagnosis not present

## 2019-06-29 DIAGNOSIS — E1122 Type 2 diabetes mellitus with diabetic chronic kidney disease: Secondary | ICD-10-CM | POA: Diagnosis not present

## 2019-06-29 DIAGNOSIS — I2510 Atherosclerotic heart disease of native coronary artery without angina pectoris: Secondary | ICD-10-CM | POA: Diagnosis not present

## 2019-06-29 DIAGNOSIS — I130 Hypertensive heart and chronic kidney disease with heart failure and stage 1 through stage 4 chronic kidney disease, or unspecified chronic kidney disease: Secondary | ICD-10-CM | POA: Diagnosis not present

## 2019-06-29 DIAGNOSIS — N189 Chronic kidney disease, unspecified: Secondary | ICD-10-CM | POA: Diagnosis not present

## 2019-06-29 DIAGNOSIS — I4891 Unspecified atrial fibrillation: Secondary | ICD-10-CM | POA: Diagnosis not present

## 2019-06-30 NOTE — Telephone Encounter (Signed)
Fax received with signed order to deactivate pt AICD due to hospice status.  Order is signed by Dr. Tish Frederickson.  Spoke with [employer] representative, she requested to have [place] contact her to set up time when Hospice nurse can be at home with pt.  Contacted [name], provided her with phone number for Dannial Monarch to contact to arrange the visit.

## 2019-07-01 DIAGNOSIS — E1122 Type 2 diabetes mellitus with diabetic chronic kidney disease: Secondary | ICD-10-CM | POA: Diagnosis not present

## 2019-07-01 DIAGNOSIS — I5032 Chronic diastolic (congestive) heart failure: Secondary | ICD-10-CM | POA: Diagnosis not present

## 2019-07-01 DIAGNOSIS — I130 Hypertensive heart and chronic kidney disease with heart failure and stage 1 through stage 4 chronic kidney disease, or unspecified chronic kidney disease: Secondary | ICD-10-CM | POA: Diagnosis not present

## 2019-07-01 DIAGNOSIS — I2510 Atherosclerotic heart disease of native coronary artery without angina pectoris: Secondary | ICD-10-CM | POA: Diagnosis not present

## 2019-07-01 DIAGNOSIS — I4891 Unspecified atrial fibrillation: Secondary | ICD-10-CM | POA: Diagnosis not present

## 2019-07-01 DIAGNOSIS — N189 Chronic kidney disease, unspecified: Secondary | ICD-10-CM | POA: Diagnosis not present

## 2019-07-03 ENCOUNTER — Encounter (HOSPITAL_COMMUNITY): Payer: Self-pay

## 2019-07-03 ENCOUNTER — Ambulatory Visit (HOSPITAL_COMMUNITY): Admit: 2019-07-03 | Payer: Medicare Other | Admitting: Internal Medicine

## 2019-07-03 SURGERY — BIV PACEMAKER INSERTION CRT-P

## 2019-07-10 ENCOUNTER — Telehealth: Payer: Self-pay

## 2019-07-10 NOTE — Telephone Encounter (Signed)
Patients wife called to let us know that her husband has passed away and she needs a return kit for his [employer] ICD. Address was verified and email was sent to [employer] for them to mail that out to her.

## 2019-07-13 ENCOUNTER — Ambulatory Visit: Payer: Medicare Other

## 2019-07-13 DEATH — deceased

## 2019-07-22 ENCOUNTER — Other Ambulatory Visit: Payer: Self-pay | Admitting: Family Medicine

## 2019-10-17 ENCOUNTER — Encounter: Payer: Medicare Other | Admitting: Internal Medicine
